# Patient Record
Sex: Female | Born: 1945 | Race: White | Hispanic: No | Marital: Married | Smoking: Former smoker
Health system: Southern US, Community
[De-identification: ages and names within clinical notes are randomized; demographics above are authoritative.]

## PROBLEM LIST (undated history)

## (undated) DIAGNOSIS — I10 Essential (primary) hypertension: Secondary | ICD-10-CM

## (undated) DIAGNOSIS — Z923 Personal history of irradiation: Secondary | ICD-10-CM

## (undated) DIAGNOSIS — I509 Heart failure, unspecified: Secondary | ICD-10-CM

## (undated) DIAGNOSIS — C50919 Malignant neoplasm of unspecified site of unspecified female breast: Secondary | ICD-10-CM

## (undated) DIAGNOSIS — I471 Supraventricular tachycardia, unspecified: Secondary | ICD-10-CM

## (undated) DIAGNOSIS — E785 Hyperlipidemia, unspecified: Secondary | ICD-10-CM

## (undated) DIAGNOSIS — C50912 Malignant neoplasm of unspecified site of left female breast: Secondary | ICD-10-CM

## (undated) DIAGNOSIS — R569 Unspecified convulsions: Secondary | ICD-10-CM

## (undated) DIAGNOSIS — G4730 Sleep apnea, unspecified: Secondary | ICD-10-CM

## (undated) DIAGNOSIS — Z9221 Personal history of antineoplastic chemotherapy: Secondary | ICD-10-CM

## (undated) DIAGNOSIS — M1990 Unspecified osteoarthritis, unspecified site: Secondary | ICD-10-CM

## (undated) DIAGNOSIS — R011 Cardiac murmur, unspecified: Secondary | ICD-10-CM

## (undated) DIAGNOSIS — H269 Unspecified cataract: Secondary | ICD-10-CM

## (undated) DIAGNOSIS — I219 Acute myocardial infarction, unspecified: Secondary | ICD-10-CM

## (undated) DIAGNOSIS — T7840XA Allergy, unspecified, initial encounter: Secondary | ICD-10-CM

## (undated) DIAGNOSIS — G709 Myoneural disorder, unspecified: Secondary | ICD-10-CM

## (undated) HISTORY — DX: Cardiac murmur, unspecified: R01.1

## (undated) HISTORY — DX: Hyperlipidemia, unspecified: E78.5

## (undated) HISTORY — PX: TRANSANAL RECTOPEXY: SHX2563

## (undated) HISTORY — DX: Malignant neoplasm of unspecified site of unspecified female breast: C50.919

## (undated) HISTORY — PX: EYE SURGERY: SHX253

## (undated) HISTORY — DX: Acute myocardial infarction, unspecified: I21.9

## (undated) HISTORY — PX: COSMETIC SURGERY: SHX468

## (undated) HISTORY — PX: LAPAROSCOPIC CHOLECYSTECTOMY: SUR755

## (undated) HISTORY — PX: DIAGNOSTIC LAPAROSCOPY: SUR761

## (undated) HISTORY — PX: BILATERAL SALPINGOOPHORECTOMY: SHX1223

## (undated) HISTORY — PX: APPENDECTOMY: SHX54

## (undated) HISTORY — PX: HAMMER TOE SURGERY: SHX385

## (undated) HISTORY — DX: Unspecified cataract: H26.9

## (undated) HISTORY — DX: Allergy, unspecified, initial encounter: T78.40XA

## (undated) HISTORY — PX: BUNIONECTOMY: SHX129

## (undated) SURGERY — LEAD REVISION/REPAIR
Anesthesia: Moderate Sedation

---
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
## 1977-07-09 HISTORY — PX: RHINOPLASTY: SUR1284

## 1982-07-09 HISTORY — PX: ABDOMINAL HYSTERECTOMY: SHX81

## 1998-07-09 HISTORY — PX: DILATION AND CURETTAGE OF UTERUS: SHX78

## 1999-02-17 ENCOUNTER — Ambulatory Visit (HOSPITAL_COMMUNITY): Admission: RE | Admit: 1999-02-17 | Discharge: 1999-02-17 | Payer: Self-pay | Admitting: Obstetrics & Gynecology

## 1999-02-17 ENCOUNTER — Encounter: Payer: Self-pay | Admitting: Obstetrics & Gynecology

## 1999-03-08 ENCOUNTER — Other Ambulatory Visit: Admission: RE | Admit: 1999-03-08 | Discharge: 1999-03-08 | Payer: Self-pay | Admitting: Obstetrics & Gynecology

## 1999-03-16 ENCOUNTER — Encounter (INDEPENDENT_AMBULATORY_CARE_PROVIDER_SITE_OTHER): Payer: Self-pay

## 1999-03-16 ENCOUNTER — Ambulatory Visit (HOSPITAL_COMMUNITY): Admission: RE | Admit: 1999-03-16 | Discharge: 1999-03-16 | Payer: Self-pay | Admitting: Obstetrics & Gynecology

## 2000-02-15 ENCOUNTER — Encounter: Payer: Self-pay | Admitting: Emergency Medicine

## 2000-02-15 ENCOUNTER — Emergency Department (HOSPITAL_COMMUNITY): Admission: EM | Admit: 2000-02-15 | Discharge: 2000-02-15 | Payer: Self-pay | Admitting: Internal Medicine

## 2000-05-09 ENCOUNTER — Encounter: Payer: Self-pay | Admitting: Obstetrics & Gynecology

## 2000-05-09 ENCOUNTER — Ambulatory Visit (HOSPITAL_COMMUNITY): Admission: RE | Admit: 2000-05-09 | Discharge: 2000-05-09 | Payer: Self-pay | Admitting: Obstetrics & Gynecology

## 2000-05-14 ENCOUNTER — Other Ambulatory Visit: Admission: RE | Admit: 2000-05-14 | Discharge: 2000-05-14 | Payer: Self-pay | Admitting: Obstetrics & Gynecology

## 2000-06-05 ENCOUNTER — Ambulatory Visit (HOSPITAL_COMMUNITY): Admission: RE | Admit: 2000-06-05 | Discharge: 2000-06-05 | Payer: Self-pay | Admitting: Gastroenterology

## 2001-08-01 ENCOUNTER — Ambulatory Visit (HOSPITAL_COMMUNITY): Admission: RE | Admit: 2001-08-01 | Discharge: 2001-08-01 | Payer: Self-pay | Admitting: Internal Medicine

## 2001-08-01 ENCOUNTER — Encounter: Payer: Self-pay | Admitting: Internal Medicine

## 2001-08-05 ENCOUNTER — Other Ambulatory Visit: Admission: RE | Admit: 2001-08-05 | Discharge: 2001-08-05 | Payer: Self-pay | Admitting: Obstetrics & Gynecology

## 2002-10-21 ENCOUNTER — Ambulatory Visit (HOSPITAL_COMMUNITY): Admission: RE | Admit: 2002-10-21 | Discharge: 2002-10-21 | Payer: Self-pay | Admitting: Internal Medicine

## 2002-10-21 ENCOUNTER — Encounter: Payer: Self-pay | Admitting: Obstetrics & Gynecology

## 2002-12-02 ENCOUNTER — Encounter: Payer: Self-pay | Admitting: Gastroenterology

## 2002-12-02 ENCOUNTER — Ambulatory Visit (HOSPITAL_COMMUNITY): Admission: RE | Admit: 2002-12-02 | Discharge: 2002-12-02 | Payer: Self-pay | Admitting: Gastroenterology

## 2002-12-11 ENCOUNTER — Encounter (HOSPITAL_BASED_OUTPATIENT_CLINIC_OR_DEPARTMENT_OTHER): Payer: Self-pay | Admitting: General Surgery

## 2002-12-11 ENCOUNTER — Ambulatory Visit (HOSPITAL_COMMUNITY): Admission: RE | Admit: 2002-12-11 | Discharge: 2002-12-12 | Payer: Self-pay | Admitting: General Surgery

## 2002-12-11 ENCOUNTER — Encounter (INDEPENDENT_AMBULATORY_CARE_PROVIDER_SITE_OTHER): Payer: Self-pay | Admitting: Specialist

## 2002-12-23 ENCOUNTER — Encounter: Admission: RE | Admit: 2002-12-23 | Discharge: 2002-12-23 | Payer: Self-pay | Admitting: Gastroenterology

## 2002-12-23 ENCOUNTER — Encounter: Payer: Self-pay | Admitting: Gastroenterology

## 2004-01-17 ENCOUNTER — Ambulatory Visit (HOSPITAL_COMMUNITY): Admission: RE | Admit: 2004-01-17 | Discharge: 2004-01-17 | Payer: Self-pay | Admitting: Obstetrics & Gynecology

## 2004-02-17 ENCOUNTER — Other Ambulatory Visit: Admission: RE | Admit: 2004-02-17 | Discharge: 2004-02-17 | Payer: Self-pay | Admitting: Obstetrics & Gynecology

## 2004-04-27 ENCOUNTER — Encounter: Admission: RE | Admit: 2004-04-27 | Discharge: 2004-05-11 | Payer: Self-pay | Admitting: Occupational Medicine

## 2004-06-08 ENCOUNTER — Encounter
Admission: RE | Admit: 2004-06-08 | Discharge: 2004-09-06 | Payer: Self-pay | Admitting: Physical Medicine & Rehabilitation

## 2004-06-09 ENCOUNTER — Ambulatory Visit: Payer: Self-pay | Admitting: Physical Medicine & Rehabilitation

## 2004-06-24 ENCOUNTER — Encounter
Admission: RE | Admit: 2004-06-24 | Discharge: 2004-06-24 | Payer: Self-pay | Admitting: Physical Medicine & Rehabilitation

## 2005-01-11 ENCOUNTER — Encounter: Admission: RE | Admit: 2005-01-11 | Discharge: 2005-01-11 | Payer: Self-pay | Admitting: Orthopedic Surgery

## 2005-05-21 ENCOUNTER — Ambulatory Visit (HOSPITAL_COMMUNITY): Admission: RE | Admit: 2005-05-21 | Discharge: 2005-05-21 | Payer: Self-pay | Admitting: Orthopedic Surgery

## 2005-05-22 ENCOUNTER — Ambulatory Visit (HOSPITAL_COMMUNITY): Admission: RE | Admit: 2005-05-22 | Discharge: 2005-05-22 | Payer: Self-pay | Admitting: Obstetrics & Gynecology

## 2005-06-05 ENCOUNTER — Ambulatory Visit (HOSPITAL_COMMUNITY): Admission: RE | Admit: 2005-06-05 | Discharge: 2005-06-05 | Payer: Self-pay | Admitting: Gastroenterology

## 2005-06-06 ENCOUNTER — Encounter: Admission: RE | Admit: 2005-06-06 | Discharge: 2005-06-06 | Payer: Self-pay | Admitting: Orthopedic Surgery

## 2005-06-13 ENCOUNTER — Encounter: Admission: RE | Admit: 2005-06-13 | Discharge: 2005-06-13 | Payer: Self-pay | Admitting: Obstetrics & Gynecology

## 2005-08-21 ENCOUNTER — Other Ambulatory Visit: Admission: RE | Admit: 2005-08-21 | Discharge: 2005-08-21 | Payer: Self-pay | Admitting: Obstetrics & Gynecology

## 2006-06-20 ENCOUNTER — Ambulatory Visit (HOSPITAL_COMMUNITY): Admission: RE | Admit: 2006-06-20 | Discharge: 2006-06-20 | Payer: Self-pay | Admitting: Obstetrics & Gynecology

## 2007-01-23 ENCOUNTER — Ambulatory Visit: Payer: Self-pay | Admitting: Internal Medicine

## 2007-01-23 DIAGNOSIS — E785 Hyperlipidemia, unspecified: Secondary | ICD-10-CM | POA: Insufficient documentation

## 2007-01-23 DIAGNOSIS — J209 Acute bronchitis, unspecified: Secondary | ICD-10-CM

## 2007-01-27 ENCOUNTER — Encounter: Payer: Self-pay | Admitting: Internal Medicine

## 2007-01-30 LAB — CONVERTED CEMR LAB
ALT: 13 units/L (ref 0–35)
AST: 20 units/L (ref 0–37)
Alkaline Phosphatase: 96 units/L (ref 39–117)
CO2: 20 meq/L (ref 19–32)
Chloride: 108 meq/L (ref 96–112)
Eosinophils Relative: 6 % — ABNORMAL HIGH (ref 0–5)
HCT: 44.7 % (ref 36.0–46.0)
Lymphocytes Relative: 19 % (ref 12–46)
Lymphs Abs: 1 10*3/uL (ref 0.7–3.3)
Neutro Abs: 3.6 10*3/uL (ref 1.7–7.7)
Neutrophils Relative %: 68 % (ref 43–77)
Platelets: 214 10*3/uL (ref 150–400)
Potassium: 4.2 meq/L (ref 3.5–5.3)
Sed Rate: 8 mm/hr (ref 0–22)
Sodium: 142 meq/L (ref 135–145)
Total Protein: 6.8 g/dL (ref 6.0–8.3)
WBC: 5.3 10*3/uL (ref 4.0–10.5)

## 2007-01-31 ENCOUNTER — Encounter (INDEPENDENT_AMBULATORY_CARE_PROVIDER_SITE_OTHER): Payer: Self-pay | Admitting: *Deleted

## 2007-03-13 ENCOUNTER — Encounter (INDEPENDENT_AMBULATORY_CARE_PROVIDER_SITE_OTHER): Payer: Self-pay | Admitting: *Deleted

## 2007-03-27 ENCOUNTER — Encounter: Payer: Self-pay | Admitting: Internal Medicine

## 2007-04-07 ENCOUNTER — Ambulatory Visit (HOSPITAL_COMMUNITY): Admission: RE | Admit: 2007-04-07 | Discharge: 2007-04-07 | Payer: Self-pay | Admitting: Cardiology

## 2007-04-09 ENCOUNTER — Ambulatory Visit (HOSPITAL_COMMUNITY): Admission: RE | Admit: 2007-04-09 | Discharge: 2007-04-09 | Payer: Self-pay | Admitting: Cardiology

## 2007-04-09 ENCOUNTER — Encounter (INDEPENDENT_AMBULATORY_CARE_PROVIDER_SITE_OTHER): Payer: Self-pay | Admitting: Cardiology

## 2007-04-18 ENCOUNTER — Ambulatory Visit (HOSPITAL_COMMUNITY): Admission: RE | Admit: 2007-04-18 | Discharge: 2007-04-18 | Payer: Self-pay | Admitting: Cardiology

## 2007-04-22 ENCOUNTER — Encounter: Payer: Self-pay | Admitting: Internal Medicine

## 2007-07-10 DIAGNOSIS — Z923 Personal history of irradiation: Secondary | ICD-10-CM

## 2007-07-10 DIAGNOSIS — Z9221 Personal history of antineoplastic chemotherapy: Secondary | ICD-10-CM

## 2007-07-10 DIAGNOSIS — C50912 Malignant neoplasm of unspecified site of left female breast: Secondary | ICD-10-CM

## 2007-07-10 HISTORY — PX: BREAST BIOPSY: SHX20

## 2007-07-10 HISTORY — DX: Personal history of irradiation: Z92.3

## 2007-07-10 HISTORY — DX: Malignant neoplasm of unspecified site of left female breast: C50.912

## 2007-07-10 HISTORY — PX: BREAST LUMPECTOMY: SHX2

## 2007-07-10 HISTORY — DX: Personal history of antineoplastic chemotherapy: Z92.21

## 2007-09-10 ENCOUNTER — Encounter: Admission: RE | Admit: 2007-09-10 | Discharge: 2007-09-10 | Payer: Self-pay | Admitting: Obstetrics & Gynecology

## 2007-09-10 ENCOUNTER — Encounter (INDEPENDENT_AMBULATORY_CARE_PROVIDER_SITE_OTHER): Payer: Self-pay | Admitting: Diagnostic Radiology

## 2007-09-19 ENCOUNTER — Ambulatory Visit (HOSPITAL_COMMUNITY): Admission: RE | Admit: 2007-09-19 | Discharge: 2007-09-19 | Payer: Self-pay | Admitting: Surgery

## 2007-10-02 ENCOUNTER — Ambulatory Visit: Admission: RE | Admit: 2007-10-02 | Discharge: 2007-12-31 | Payer: Self-pay | Admitting: Radiation Oncology

## 2007-10-17 ENCOUNTER — Ambulatory Visit (HOSPITAL_COMMUNITY): Admission: RE | Admit: 2007-10-17 | Discharge: 2007-10-17 | Payer: Self-pay | Admitting: Surgery

## 2007-10-17 ENCOUNTER — Encounter (INDEPENDENT_AMBULATORY_CARE_PROVIDER_SITE_OTHER): Payer: Self-pay | Admitting: Surgery

## 2007-10-22 ENCOUNTER — Ambulatory Visit: Payer: Self-pay | Admitting: Oncology

## 2007-11-05 LAB — CBC WITH DIFFERENTIAL/PLATELET
Eosinophils Absolute: 0.3 10*3/uL (ref 0.0–0.5)
MONO#: 0.5 10*3/uL (ref 0.1–0.9)
MONO%: 8.6 % (ref 0.0–13.0)
NEUT#: 3.9 10*3/uL (ref 1.5–6.5)
RBC: 4.7 10*6/uL (ref 3.70–5.32)
RDW: 12.9 % (ref 11.3–14.5)
WBC: 6 10*3/uL (ref 3.9–10.0)
lymph#: 1.2 10*3/uL (ref 0.9–3.3)

## 2007-11-06 LAB — COMPREHENSIVE METABOLIC PANEL
Albumin: 4.5 g/dL (ref 3.5–5.2)
Alkaline Phosphatase: 116 U/L (ref 39–117)
CO2: 25 mEq/L (ref 19–32)
Glucose, Bld: 91 mg/dL (ref 70–99)
Potassium: 4.1 mEq/L (ref 3.5–5.3)
Sodium: 141 mEq/L (ref 135–145)
Total Protein: 7.1 g/dL (ref 6.0–8.3)

## 2007-11-06 LAB — VITAMIN D 25 HYDROXY (VIT D DEFICIENCY, FRACTURES): Vit D, 25-Hydroxy: 36 ng/mL (ref 30–89)

## 2007-11-18 ENCOUNTER — Ambulatory Visit (HOSPITAL_BASED_OUTPATIENT_CLINIC_OR_DEPARTMENT_OTHER): Admission: RE | Admit: 2007-11-18 | Discharge: 2007-11-18 | Payer: Self-pay | Admitting: Surgery

## 2007-12-02 ENCOUNTER — Ambulatory Visit: Payer: Self-pay | Admitting: Oncology

## 2007-12-04 LAB — CBC WITH DIFFERENTIAL/PLATELET
BASO%: 0.1 % (ref 0.0–2.0)
EOS%: 0.5 % (ref 0.0–7.0)
MCH: 31 pg (ref 26.0–34.0)
MCV: 90.2 fL (ref 81.0–101.0)
MONO%: 5.9 % (ref 0.0–13.0)
RBC: 4.32 10*6/uL (ref 3.70–5.32)
RDW: 13 % (ref 11.3–14.5)

## 2007-12-11 ENCOUNTER — Encounter: Payer: Self-pay | Admitting: Internal Medicine

## 2007-12-11 LAB — CBC WITH DIFFERENTIAL/PLATELET
BASO%: 0.1 % (ref 0.0–2.0)
Basophils Absolute: 0 10*3/uL (ref 0.0–0.1)
Eosinophils Absolute: 0 10*3/uL (ref 0.0–0.5)
HCT: 34 % — ABNORMAL LOW (ref 34.8–46.6)
HGB: 11.9 g/dL (ref 11.6–15.9)
LYMPH%: 6.4 % — ABNORMAL LOW (ref 14.0–48.0)
MONO#: 0 10*3/uL — ABNORMAL LOW (ref 0.1–0.9)
NEUT#: 5 10*3/uL (ref 1.5–6.5)
NEUT%: 92.5 % — ABNORMAL HIGH (ref 39.6–76.8)
Platelets: 171 10*3/uL (ref 145–400)
WBC: 5.4 10*3/uL (ref 3.9–10.0)
lymph#: 0.3 10*3/uL — ABNORMAL LOW (ref 0.9–3.3)

## 2007-12-26 LAB — CBC WITH DIFFERENTIAL/PLATELET
Basophils Absolute: 0.1 10*3/uL (ref 0.0–0.1)
EOS%: 2.9 % (ref 0.0–7.0)
HCT: 37.9 % (ref 34.8–46.6)
HGB: 13.4 g/dL (ref 11.6–15.9)
LYMPH%: 10.2 % — ABNORMAL LOW (ref 14.0–48.0)
MCH: 31.8 pg (ref 26.0–34.0)
MCHC: 35.3 g/dL (ref 32.0–36.0)
MCV: 90.1 fL (ref 81.0–101.0)
NEUT%: 74.4 % (ref 39.6–76.8)
Platelets: 242 10*3/uL (ref 145–400)
lymph#: 0.9 10*3/uL (ref 0.9–3.3)

## 2007-12-31 ENCOUNTER — Encounter: Payer: Self-pay | Admitting: Internal Medicine

## 2007-12-31 LAB — CBC WITH DIFFERENTIAL/PLATELET
BASO%: 0 % (ref 0.0–2.0)
Basophils Absolute: 0 10*3/uL (ref 0.0–0.1)
EOS%: 1.1 % (ref 0.0–7.0)
HGB: 12.3 g/dL (ref 11.6–15.9)
MCH: 32.1 pg (ref 26.0–34.0)
MCHC: 35.2 g/dL (ref 32.0–36.0)
MCV: 91.2 fL (ref 81.0–101.0)
MONO%: 0.5 % (ref 0.0–13.0)
NEUT%: 94.9 % — ABNORMAL HIGH (ref 39.6–76.8)
RDW: 15.7 % — ABNORMAL HIGH (ref 11.3–14.5)

## 2008-01-12 ENCOUNTER — Encounter: Payer: Self-pay | Admitting: Internal Medicine

## 2008-01-12 LAB — CBC WITH DIFFERENTIAL/PLATELET
Basophils Absolute: 0.1 10*3/uL (ref 0.0–0.1)
Eosinophils Absolute: 0.2 10*3/uL (ref 0.0–0.5)
HCT: 37.5 % (ref 34.8–46.6)
HGB: 13.4 g/dL (ref 11.6–15.9)
MCV: 89.7 fL (ref 81.0–101.0)
MONO%: 8.5 % (ref 0.0–13.0)
NEUT#: 5.6 10*3/uL (ref 1.5–6.5)
NEUT%: 76.3 % (ref 39.6–76.8)
RDW: 15.3 % — ABNORMAL HIGH (ref 11.3–14.5)

## 2008-01-12 LAB — COMPREHENSIVE METABOLIC PANEL
ALT: 19 U/L (ref 0–35)
AST: 16 U/L (ref 0–37)
CO2: 22 mEq/L (ref 19–32)
Calcium: 8.5 mg/dL (ref 8.4–10.5)
Chloride: 106 mEq/L (ref 96–112)
Creatinine, Ser: 0.62 mg/dL (ref 0.40–1.20)
Potassium: 4.1 mEq/L (ref 3.5–5.3)
Sodium: 137 mEq/L (ref 135–145)
Total Protein: 6.5 g/dL (ref 6.0–8.3)

## 2008-01-14 ENCOUNTER — Ambulatory Visit: Payer: Self-pay | Admitting: Oncology

## 2008-01-19 ENCOUNTER — Encounter: Payer: Self-pay | Admitting: Internal Medicine

## 2008-01-19 LAB — CBC WITH DIFFERENTIAL/PLATELET
Basophils Absolute: 0 10*3/uL (ref 0.0–0.1)
Eosinophils Absolute: 0 10*3/uL (ref 0.0–0.5)
HGB: 11.4 g/dL — ABNORMAL LOW (ref 11.6–15.9)
LYMPH%: 15.5 % (ref 14.0–48.0)
MCV: 90.8 fL (ref 81.0–101.0)
MONO%: 5.8 % (ref 0.0–13.0)
NEUT#: 1.2 10*3/uL — ABNORMAL LOW (ref 1.5–6.5)
Platelets: 112 10*3/uL — ABNORMAL LOW (ref 145–400)

## 2008-01-19 LAB — URINALYSIS, MICROSCOPIC - CHCC
Blood: NEGATIVE
Leukocyte Esterase: NEGATIVE
Nitrite: NEGATIVE
Protein: NEGATIVE mg/dL
pH: 7.5 (ref 4.6–8.0)

## 2008-01-28 ENCOUNTER — Encounter: Payer: Self-pay | Admitting: Internal Medicine

## 2008-01-28 LAB — CBC WITH DIFFERENTIAL/PLATELET
Basophils Absolute: 0 10*3/uL (ref 0.0–0.1)
EOS%: 0.7 % (ref 0.0–7.0)
HCT: 32.6 % — ABNORMAL LOW (ref 34.8–46.6)
HGB: 11.6 g/dL (ref 11.6–15.9)
MCH: 32.5 pg (ref 26.0–34.0)
MCV: 91.5 fL (ref 81.0–101.0)
MONO%: 7.8 % (ref 0.0–13.0)
NEUT%: 82.5 % — ABNORMAL HIGH (ref 39.6–76.8)
lymph#: 0.7 10*3/uL — ABNORMAL LOW (ref 0.9–3.3)

## 2008-02-06 ENCOUNTER — Encounter: Payer: Self-pay | Admitting: Internal Medicine

## 2008-02-06 LAB — CBC WITH DIFFERENTIAL/PLATELET
Basophils Absolute: 0.1 10*3/uL (ref 0.0–0.1)
EOS%: 2.5 % (ref 0.0–7.0)
HGB: 12 g/dL (ref 11.6–15.9)
LYMPH%: 10.6 % — ABNORMAL LOW (ref 14.0–48.0)
MCH: 32.5 pg (ref 26.0–34.0)
MCV: 91.3 fL (ref 81.0–101.0)
MONO%: 10 % (ref 0.0–13.0)
RDW: 15.3 % — ABNORMAL HIGH (ref 11.3–14.5)

## 2008-02-13 LAB — CBC WITH DIFFERENTIAL/PLATELET
Basophils Absolute: 0.1 10*3/uL (ref 0.0–0.1)
Eosinophils Absolute: 0.2 10*3/uL (ref 0.0–0.5)
HCT: 35.8 % (ref 34.8–46.6)
HGB: 12.9 g/dL (ref 11.6–15.9)
LYMPH%: 14 % (ref 14.0–48.0)
MCV: 92.2 fL (ref 81.0–101.0)
MONO%: 9.8 % (ref 0.0–13.0)
NEUT#: 2.7 10*3/uL (ref 1.5–6.5)
Platelets: 175 10*3/uL (ref 145–400)

## 2008-02-20 ENCOUNTER — Encounter: Payer: Self-pay | Admitting: Internal Medicine

## 2008-02-20 LAB — CBC WITH DIFFERENTIAL/PLATELET
Eosinophils Absolute: 0.2 10*3/uL (ref 0.0–0.5)
HCT: 34.1 % — ABNORMAL LOW (ref 34.8–46.6)
LYMPH%: 11.7 % — ABNORMAL LOW (ref 14.0–48.0)
MCHC: 35.8 g/dL (ref 32.0–36.0)
MCV: 92.2 fL (ref 81.0–101.0)
MONO%: 7.8 % (ref 0.0–13.0)
NEUT#: 3.7 10*3/uL (ref 1.5–6.5)
NEUT%: 74.3 % (ref 39.6–76.8)
Platelets: 262 10*3/uL (ref 145–400)
RBC: 3.7 10*6/uL (ref 3.70–5.32)

## 2008-02-27 ENCOUNTER — Encounter: Payer: Self-pay | Admitting: Internal Medicine

## 2008-02-27 LAB — CBC WITH DIFFERENTIAL/PLATELET
BASO%: 1.7 % (ref 0.0–2.0)
EOS%: 4.1 % (ref 0.0–7.0)
LYMPH%: 11.5 % — ABNORMAL LOW (ref 14.0–48.0)
MCH: 33.6 pg (ref 26.0–34.0)
MCHC: 35.9 g/dL (ref 32.0–36.0)
MONO#: 0.3 10*3/uL (ref 0.1–0.9)
MONO%: 7.3 % (ref 0.0–13.0)
NEUT%: 75.4 % (ref 39.6–76.8)
Platelets: 218 10*3/uL (ref 145–400)
RBC: 3.79 10*6/uL (ref 3.70–5.32)
WBC: 4.8 10*3/uL (ref 3.9–10.0)

## 2008-03-02 ENCOUNTER — Ambulatory Visit: Payer: Self-pay | Admitting: Oncology

## 2008-03-04 ENCOUNTER — Encounter: Payer: Self-pay | Admitting: Internal Medicine

## 2008-03-04 LAB — CBC WITH DIFFERENTIAL/PLATELET
BASO%: 0.4 % (ref 0.0–2.0)
EOS%: 3.4 % (ref 0.0–7.0)
HCT: 34 % — ABNORMAL LOW (ref 34.8–46.6)
MCH: 34 pg (ref 26.0–34.0)
MCHC: 35.2 g/dL (ref 32.0–36.0)
MONO#: 0.3 10*3/uL (ref 0.1–0.9)
RBC: 3.52 10*6/uL — ABNORMAL LOW (ref 3.70–5.32)
RDW: 14.7 % — ABNORMAL HIGH (ref 11.3–14.5)
WBC: 3.4 10*3/uL — ABNORMAL LOW (ref 3.9–10.0)
lymph#: 0.3 10*3/uL — ABNORMAL LOW (ref 0.9–3.3)

## 2008-03-19 ENCOUNTER — Encounter: Payer: Self-pay | Admitting: Oncology

## 2008-03-19 ENCOUNTER — Ambulatory Visit: Payer: Self-pay | Admitting: Vascular Surgery

## 2008-03-19 ENCOUNTER — Ambulatory Visit: Admission: RE | Admit: 2008-03-19 | Discharge: 2008-03-19 | Payer: Self-pay | Admitting: Oncology

## 2008-03-19 LAB — CBC WITH DIFFERENTIAL/PLATELET
BASO%: 2.1 % — ABNORMAL HIGH (ref 0.0–2.0)
Eosinophils Absolute: 0.2 10*3/uL (ref 0.0–0.5)
LYMPH%: 21 % (ref 14.0–48.0)
MCHC: 35.6 g/dL (ref 32.0–36.0)
MONO#: 0.5 10*3/uL (ref 0.1–0.9)
Platelets: 242 10*3/uL (ref 145–400)
RBC: 4.2 10*6/uL (ref 3.70–5.32)
lymph#: 0.6 10*3/uL — ABNORMAL LOW (ref 0.9–3.3)

## 2008-03-26 LAB — CBC WITH DIFFERENTIAL/PLATELET
BASO%: 2.3 % — ABNORMAL HIGH (ref 0.0–2.0)
Basophils Absolute: 0.1 10*3/uL (ref 0.0–0.1)
Eosinophils Absolute: 0.2 10*3/uL (ref 0.0–0.5)
HCT: 37.9 % (ref 34.8–46.6)
HGB: 13.5 g/dL (ref 11.6–15.9)
LYMPH%: 20.2 % (ref 14.0–48.0)
MONO#: 0.3 10*3/uL (ref 0.1–0.9)
NEUT#: 2.4 10*3/uL (ref 1.5–6.5)
NEUT%: 64.1 % (ref 39.6–76.8)
Platelets: 234 10*3/uL (ref 145–400)
WBC: 3.8 10*3/uL — ABNORMAL LOW (ref 3.9–10.0)
lymph#: 0.8 10*3/uL — ABNORMAL LOW (ref 0.9–3.3)

## 2008-04-02 ENCOUNTER — Encounter: Payer: Self-pay | Admitting: Internal Medicine

## 2008-04-02 LAB — CBC WITH DIFFERENTIAL/PLATELET
Basophils Absolute: 0.1 10*3/uL (ref 0.0–0.1)
EOS%: 6.5 % (ref 0.0–7.0)
HCT: 37.1 % (ref 34.8–46.6)
HGB: 13.2 g/dL (ref 11.6–15.9)
LYMPH%: 17.6 % (ref 14.0–48.0)
MCH: 32.4 pg (ref 26.0–34.0)
MCHC: 35.7 g/dL (ref 32.0–36.0)
MCV: 90.7 fL (ref 81.0–101.0)
NEUT%: 65.8 % (ref 39.6–76.8)
Platelets: 272 10*3/uL (ref 145–400)
lymph#: 0.7 10*3/uL — ABNORMAL LOW (ref 0.9–3.3)

## 2008-04-09 LAB — CBC WITH DIFFERENTIAL/PLATELET
BASO%: 2 % (ref 0.0–2.0)
Basophils Absolute: 0.1 10*3/uL (ref 0.0–0.1)
Eosinophils Absolute: 0.1 10*3/uL (ref 0.0–0.5)
HCT: 37.1 % (ref 34.8–46.6)
HGB: 13.1 g/dL (ref 11.6–15.9)
MCHC: 35.4 g/dL (ref 32.0–36.0)
MONO#: 0.2 10*3/uL (ref 0.1–0.9)
NEUT#: 1.9 10*3/uL (ref 1.5–6.5)
NEUT%: 65.2 % (ref 39.6–76.8)
WBC: 2.9 10*3/uL — ABNORMAL LOW (ref 3.9–10.0)
lymph#: 0.6 10*3/uL — ABNORMAL LOW (ref 0.9–3.3)

## 2008-04-13 ENCOUNTER — Ambulatory Visit: Admission: RE | Admit: 2008-04-13 | Discharge: 2008-06-28 | Payer: Self-pay | Admitting: Radiation Oncology

## 2008-04-16 ENCOUNTER — Encounter: Payer: Self-pay | Admitting: Internal Medicine

## 2008-04-16 LAB — CBC WITH DIFFERENTIAL/PLATELET
Basophils Absolute: 0 10*3/uL (ref 0.0–0.1)
EOS%: 3.9 % (ref 0.0–7.0)
HCT: 36.6 % (ref 34.8–46.6)
HGB: 13.2 g/dL (ref 11.6–15.9)
MCH: 34.1 pg — ABNORMAL HIGH (ref 26.0–34.0)
NEUT%: 65.1 % (ref 39.6–76.8)
lymph#: 0.8 10*3/uL — ABNORMAL LOW (ref 0.9–3.3)

## 2008-05-28 ENCOUNTER — Ambulatory Visit: Payer: Self-pay | Admitting: Oncology

## 2008-06-07 ENCOUNTER — Encounter: Payer: Self-pay | Admitting: Internal Medicine

## 2008-06-07 LAB — CBC WITH DIFFERENTIAL/PLATELET
Basophils Absolute: 0 10*3/uL (ref 0.0–0.1)
Eosinophils Absolute: 0.1 10*3/uL (ref 0.0–0.5)
HGB: 13.6 g/dL (ref 11.6–15.9)
MONO#: 0.5 10*3/uL (ref 0.1–0.9)
NEUT#: 5.8 10*3/uL (ref 1.5–6.5)
RDW: 13.3 % (ref 11.3–14.5)
lymph#: 0.4 10*3/uL — ABNORMAL LOW (ref 0.9–3.3)

## 2008-06-08 LAB — VITAMIN D 25 HYDROXY (VIT D DEFICIENCY, FRACTURES): Vit D, 25-Hydroxy: 37 ng/mL (ref 30–89)

## 2008-06-08 LAB — COMPREHENSIVE METABOLIC PANEL
Albumin: 4.6 g/dL (ref 3.5–5.2)
BUN: 17 mg/dL (ref 6–23)
Calcium: 9.9 mg/dL (ref 8.4–10.5)
Chloride: 102 mEq/L (ref 96–112)
Glucose, Bld: 89 mg/dL (ref 70–99)
Potassium: 3.7 mEq/L (ref 3.5–5.3)

## 2008-06-21 ENCOUNTER — Ambulatory Visit (HOSPITAL_BASED_OUTPATIENT_CLINIC_OR_DEPARTMENT_OTHER): Admission: RE | Admit: 2008-06-21 | Discharge: 2008-06-21 | Payer: Self-pay | Admitting: Surgery

## 2008-08-30 ENCOUNTER — Ambulatory Visit: Payer: Self-pay | Admitting: Oncology

## 2008-09-01 ENCOUNTER — Encounter: Payer: Self-pay | Admitting: Internal Medicine

## 2008-09-01 LAB — CBC WITH DIFFERENTIAL/PLATELET
BASO%: 0.5 % (ref 0.0–2.0)
HCT: 39.8 % (ref 34.8–46.6)
MCHC: 35 g/dL (ref 31.5–36.0)
MONO#: 0.5 10*3/uL (ref 0.1–0.9)
NEUT#: 3.5 10*3/uL (ref 1.5–6.5)
NEUT%: 70 % (ref 38.4–76.8)
WBC: 5 10*3/uL (ref 3.9–10.3)
lymph#: 0.7 10*3/uL — ABNORMAL LOW (ref 0.9–3.3)

## 2008-09-01 LAB — COMPREHENSIVE METABOLIC PANEL
AST: 26 U/L (ref 0–37)
BUN: 17 mg/dL (ref 6–23)
CO2: 26 mEq/L (ref 19–32)
Calcium: 9.6 mg/dL (ref 8.4–10.5)
Chloride: 105 mEq/L (ref 96–112)
Creatinine, Ser: 0.7 mg/dL (ref 0.40–1.20)

## 2008-09-01 LAB — CANCER ANTIGEN 27.29: CA 27.29: 11 U/mL (ref 0–39)

## 2008-09-10 ENCOUNTER — Encounter: Admission: RE | Admit: 2008-09-10 | Discharge: 2008-09-10 | Payer: Self-pay | Admitting: Obstetrics & Gynecology

## 2008-12-31 ENCOUNTER — Ambulatory Visit: Payer: Self-pay | Admitting: Oncology

## 2009-01-02 ENCOUNTER — Encounter: Payer: Self-pay | Admitting: Family Medicine

## 2009-01-02 ENCOUNTER — Ambulatory Visit: Payer: Self-pay | Admitting: Family Medicine

## 2009-01-02 DIAGNOSIS — J329 Chronic sinusitis, unspecified: Secondary | ICD-10-CM | POA: Insufficient documentation

## 2009-01-04 LAB — CBC WITH DIFFERENTIAL/PLATELET
Basophils Absolute: 0 10*3/uL (ref 0.0–0.1)
Eosinophils Absolute: 0.3 10*3/uL (ref 0.0–0.5)
HCT: 39 % (ref 34.8–46.6)
HGB: 13.9 g/dL (ref 11.6–15.9)
LYMPH%: 12.7 % — ABNORMAL LOW (ref 14.0–49.7)
MCV: 90.3 fL (ref 79.5–101.0)
MONO#: 0.4 10*3/uL (ref 0.1–0.9)
NEUT#: 4 10*3/uL (ref 1.5–6.5)
NEUT%: 73.2 % (ref 38.4–76.8)
Platelets: 205 10*3/uL (ref 145–400)
WBC: 5.5 10*3/uL (ref 3.9–10.3)

## 2009-01-04 LAB — COMPREHENSIVE METABOLIC PANEL
BUN: 18 mg/dL (ref 6–23)
CO2: 30 mEq/L (ref 19–32)
Creatinine, Ser: 0.66 mg/dL (ref 0.40–1.20)
Glucose, Bld: 90 mg/dL (ref 70–99)
Sodium: 140 mEq/L (ref 135–145)
Total Bilirubin: 0.6 mg/dL (ref 0.3–1.2)
Total Protein: 6.8 g/dL (ref 6.0–8.3)

## 2009-01-04 LAB — CANCER ANTIGEN 27.29: CA 27.29: 13 U/mL (ref 0–39)

## 2009-01-05 ENCOUNTER — Telehealth: Payer: Self-pay | Admitting: Family Medicine

## 2009-01-06 ENCOUNTER — Telehealth (INDEPENDENT_AMBULATORY_CARE_PROVIDER_SITE_OTHER): Payer: Self-pay | Admitting: *Deleted

## 2009-04-15 ENCOUNTER — Ambulatory Visit: Payer: Self-pay | Admitting: Oncology

## 2009-04-19 LAB — COMPREHENSIVE METABOLIC PANEL
AST: 19 U/L (ref 0–37)
Alkaline Phosphatase: 154 U/L — ABNORMAL HIGH (ref 39–117)
BUN: 17 mg/dL (ref 6–23)
Glucose, Bld: 77 mg/dL (ref 70–99)
Total Bilirubin: 0.5 mg/dL (ref 0.3–1.2)

## 2009-04-19 LAB — CBC WITH DIFFERENTIAL/PLATELET
Basophils Absolute: 0 10*3/uL (ref 0.0–0.1)
EOS%: 2.7 % (ref 0.0–7.0)
Eosinophils Absolute: 0.1 10*3/uL (ref 0.0–0.5)
LYMPH%: 13.3 % — ABNORMAL LOW (ref 14.0–49.7)
MCH: 31.6 pg (ref 25.1–34.0)
MCV: 89.7 fL (ref 79.5–101.0)
MONO%: 7.7 % (ref 0.0–14.0)
NEUT#: 4.1 10*3/uL (ref 1.5–6.5)
Platelets: 221 10*3/uL (ref 145–400)
RBC: 4.51 10*6/uL (ref 3.70–5.45)
RDW: 13.5 % (ref 11.2–14.5)

## 2009-04-22 ENCOUNTER — Ambulatory Visit (HOSPITAL_BASED_OUTPATIENT_CLINIC_OR_DEPARTMENT_OTHER): Admission: RE | Admit: 2009-04-22 | Discharge: 2009-04-22 | Payer: Self-pay | Admitting: Podiatry

## 2009-05-13 ENCOUNTER — Ambulatory Visit (HOSPITAL_BASED_OUTPATIENT_CLINIC_OR_DEPARTMENT_OTHER): Admission: RE | Admit: 2009-05-13 | Discharge: 2009-05-13 | Payer: Self-pay | Admitting: Podiatry

## 2009-09-19 ENCOUNTER — Encounter: Admission: RE | Admit: 2009-09-19 | Discharge: 2009-09-19 | Payer: Self-pay | Admitting: Oncology

## 2009-10-07 ENCOUNTER — Ambulatory Visit: Admission: RE | Admit: 2009-10-07 | Discharge: 2009-10-07 | Payer: Self-pay | Admitting: Anesthesiology

## 2009-10-25 ENCOUNTER — Ambulatory Visit: Payer: Self-pay | Admitting: Oncology

## 2009-10-27 ENCOUNTER — Ambulatory Visit (HOSPITAL_COMMUNITY): Admission: RE | Admit: 2009-10-27 | Discharge: 2009-10-27 | Payer: Self-pay | Admitting: Oncology

## 2009-10-27 LAB — CBC WITH DIFFERENTIAL/PLATELET
Eosinophils Absolute: 0.3 10*3/uL (ref 0.0–0.5)
HCT: 40.3 % (ref 34.8–46.6)
LYMPH%: 19 % (ref 14.0–49.7)
MONO#: 0.4 10*3/uL (ref 0.1–0.9)
NEUT#: 2.6 10*3/uL (ref 1.5–6.5)
NEUT%: 64.7 % (ref 38.4–76.8)
Platelets: 173 10*3/uL (ref 145–400)
WBC: 4.1 10*3/uL (ref 3.9–10.3)
lymph#: 0.8 10*3/uL — ABNORMAL LOW (ref 0.9–3.3)

## 2009-10-27 LAB — CANCER ANTIGEN 27.29: CA 27.29: 13 U/mL (ref 0–39)

## 2009-10-27 LAB — COMPREHENSIVE METABOLIC PANEL
CO2: 28 mEq/L (ref 19–32)
Calcium: 9.2 mg/dL (ref 8.4–10.5)
Chloride: 106 mEq/L (ref 96–112)
Glucose, Bld: 115 mg/dL — ABNORMAL HIGH (ref 70–99)
Sodium: 140 mEq/L (ref 135–145)
Total Bilirubin: 0.8 mg/dL (ref 0.3–1.2)
Total Protein: 6.6 g/dL (ref 6.0–8.3)

## 2010-05-28 ENCOUNTER — Emergency Department (HOSPITAL_COMMUNITY): Admission: EM | Admit: 2010-05-28 | Discharge: 2010-05-28 | Payer: Self-pay | Admitting: Emergency Medicine

## 2010-06-13 ENCOUNTER — Ambulatory Visit (HOSPITAL_COMMUNITY)
Admission: RE | Admit: 2010-06-13 | Discharge: 2010-06-13 | Payer: Self-pay | Source: Home / Self Care | Attending: Oncology | Admitting: Oncology

## 2010-07-29 ENCOUNTER — Encounter: Payer: Self-pay | Admitting: Specialist

## 2010-07-30 ENCOUNTER — Encounter: Payer: Self-pay | Admitting: Surgery

## 2010-07-30 ENCOUNTER — Encounter: Payer: Self-pay | Admitting: Obstetrics & Gynecology

## 2010-09-08 ENCOUNTER — Other Ambulatory Visit: Payer: Self-pay | Admitting: Oncology

## 2010-09-08 DIAGNOSIS — Z9889 Other specified postprocedural states: Secondary | ICD-10-CM

## 2010-09-19 LAB — TSH: TSH: 6.125 u[IU]/mL — ABNORMAL HIGH (ref 0.350–4.500)

## 2010-09-19 LAB — POCT I-STAT, CHEM 8
BUN: 20 mg/dL (ref 6–23)
Calcium, Ion: 0.96 mmol/L — ABNORMAL LOW (ref 1.12–1.32)
HCT: 46 % (ref 36.0–46.0)
Hemoglobin: 15.6 g/dL — ABNORMAL HIGH (ref 12.0–15.0)
Sodium: 137 mEq/L (ref 135–145)
TCO2: 24 mmol/L (ref 0–100)

## 2010-09-27 ENCOUNTER — Ambulatory Visit
Admission: RE | Admit: 2010-09-27 | Discharge: 2010-09-27 | Disposition: A | Discharge status: Home / Self Care | Payer: 59 | Source: Ambulatory Visit | Attending: Oncology | Admitting: Oncology

## 2010-09-27 DIAGNOSIS — Z9889 Other specified postprocedural states: Secondary | ICD-10-CM

## 2010-10-11 LAB — POCT HEMOGLOBIN-HEMACUE: Hemoglobin: 14.9 g/dL (ref 12.0–15.0)

## 2010-10-19 ENCOUNTER — Other Ambulatory Visit: Payer: Self-pay | Admitting: Oncology

## 2010-10-19 ENCOUNTER — Encounter (HOSPITAL_BASED_OUTPATIENT_CLINIC_OR_DEPARTMENT_OTHER): Payer: 59 | Admitting: Oncology

## 2010-10-19 DIAGNOSIS — Z170 Estrogen receptor positive status [ER+]: Secondary | ICD-10-CM

## 2010-10-19 DIAGNOSIS — C50219 Malignant neoplasm of upper-inner quadrant of unspecified female breast: Secondary | ICD-10-CM

## 2010-10-19 LAB — COMPREHENSIVE METABOLIC PANEL
ALT: 20 U/L (ref 0–35)
AST: 25 U/L (ref 0–37)
Albumin: 4.2 g/dL (ref 3.5–5.2)
Alkaline Phosphatase: 134 U/L — ABNORMAL HIGH (ref 39–117)
BUN: 15 mg/dL (ref 6–23)
Calcium: 9.5 mg/dL (ref 8.4–10.5)
Chloride: 105 mEq/L (ref 96–112)
Potassium: 3.8 mEq/L (ref 3.5–5.3)
Sodium: 138 mEq/L (ref 135–145)

## 2010-10-19 LAB — CBC WITH DIFFERENTIAL/PLATELET
BASO%: 0.4 % (ref 0.0–2.0)
EOS%: 4.1 % (ref 0.0–7.0)
HGB: 14 g/dL (ref 11.6–15.9)
MCH: 31.2 pg (ref 25.1–34.0)
MCHC: 34.7 g/dL (ref 31.5–36.0)
MCV: 89.8 fL (ref 79.5–101.0)
MONO%: 7.7 % (ref 0.0–14.0)
RBC: 4.48 10*6/uL (ref 3.70–5.45)
RDW: 13.3 % (ref 11.2–14.5)
lymph#: 1.1 10*3/uL (ref 0.9–3.3)

## 2010-10-26 ENCOUNTER — Encounter (HOSPITAL_BASED_OUTPATIENT_CLINIC_OR_DEPARTMENT_OTHER): Payer: 59 | Admitting: Oncology

## 2010-10-26 DIAGNOSIS — R232 Flushing: Secondary | ICD-10-CM

## 2010-10-26 DIAGNOSIS — C50419 Malignant neoplasm of upper-outer quadrant of unspecified female breast: Secondary | ICD-10-CM

## 2010-10-26 DIAGNOSIS — Z170 Estrogen receptor positive status [ER+]: Secondary | ICD-10-CM

## 2010-11-21 NOTE — Op Note (Signed)
NAMELAURENASHLEY, Patricia Collier            ACCOUNT NO.:  [id number]   MEDICAL RECORD NO.:  [id number]          PATIENT TYPE:  AMB   LOCATION:  DSC                          FACILITY:  [facility]   PHYSICIAN:  Currie Paris, M.D.DATE OF BIRTH:  August 28, 1945   DATE OF PROCEDURE:  DATE OF DISCHARGE:                               OPERATIVE REPORT   PREOPERATIVE DIAGNOSIS:  Unneeded port.   POSTOPERATIVE DIAGNOSIS:  Unneeded port.   PROCEDURE:  Port-A-Cath removal.   SURGEON:  Currie Paris, MD   ANESTHESIA:  MAC.   CLINICAL HISTORY:  This is a 65 year old lady who has completed her  chemotherapy and wished to have her port removed.   DESCRIPTION OF PROCEDURE:  The patient was seen in the holding area and  had no further questions.  We identified the port site and marked it.   The patient was taken to the operating room.  After satisfactory IV  sedation had been obtained, the area around the port was prepped and  draped.  The time-out was done.   A combination of 1% Xylocaine with epi and 0.5% plain Marcaine was used  for local.  I infiltrated the area.  I opened the old scar.  The port  capsule was opened and the 3 Prolene holding sutures cut and removed.  The port was backed part way out.  A Vicryl suture was used to put a  figure-of-eight around the port tract and the tubing was pulled out  intact.  There was no backbleeding.   Incision was closed with 3-0 Vicryl, 4-0 Monocryl subcuticular, and  Dermabond.   The patient tolerated the procedure well and there were no  complications.  All counts were correct.      Currie Paris, M.D.  Electronically Signed     CJS/MEDQ  D:  06/21/2008  T:  06/21/2008  Job:  [id number]

## 2010-11-21 NOTE — Op Note (Signed)
Patricia Collier, Patricia Collier            ACCOUNT NO.:  [id number]   MEDICAL RECORD NO.:  [id number]          PATIENT TYPE:  AMB   LOCATION:  DSC                          FACILITY:  [facility]   PHYSICIAN:  Currie Paris, M.D.DATE OF BIRTH:  22-Dec-1945   DATE OF PROCEDURE:  11/18/2007  DATE OF DISCHARGE:                               OPERATIVE REPORT   PREOPERATIVE DIAGNOSIS:  Carcinoma, left breast, stage I.   POSTOPERATIVE DIAGNOSIS:  Carcinoma, left breast, stage I.   OPERATION:  Port-A-Cath placement for chemotherapy.   SURGEON:  Currie Paris, M.D.   ANESTHESIA:  MAC.   CLINICAL HISTORY:  This patient is a 61-year lady getting ready to begin  chemo for her breast cancer.   DESCRIPTION OF PROCEDURE:  The patient was seen in the holding area, and  she had no further questions.  We confirmed Port-A-Cath placement as the  planned procedure.   The patient was taken to the operating room and after satisfactory IV  sedation, the upper chest and lower neck were prepped and draped as a  sterile field.  The time-out was performed.   Xylocaine 1% plain was used for local and infiltrated in the right  infraclavicular fossa.  The subclavian vein was readily entered on  initial attempt, the guidewire threaded easily, and under fluoro into  the superior vena cava right atrial area.   Additional local was infiltrated on the anterior chest wall, and a  transverse incision made in a pocket fashion with cautery.  Port-A-Cath  tubing was pulled from that site into the guidewire site.  With the  patient in Trendelenburg again, the guidewire site was dilated once  through the dilator with peel-away sheath, and the dilator and guidewire  were removed.  This all done under fluoro control.  The catheter was  then introduced to about 17 cm where it appeared to be in the right  atrium.  The peel-away sheath was removed.  The catheter aspirated and  flushed easily.   Using fluoro we backed this  up to about 17-16 cm, at which point it  appeared to be in the distal SVC.  Again, it flushed in easily.  The  reservoir was then flushed, attached, and locking mechanism engaged.  This aspirated and flushed easily.  It was secured to the fascia with 3  sutures of 3-0 Prolene.  A final check for fluoro showed good  positioning and no kinks.   The reservoir was flushed with a concentrated aqueous heparin.  The  incision was then closed with 3-0 Vicryl, 4-0 Monocryl, subcuticular,  and Dermabond.   The patient tolerated the procedure well and there were no  complications.      Currie Paris, M.D.  Electronically Signed     CJS/MEDQ  D:  11/18/2007  T:  11/19/2007  Job:  [id number]

## 2010-11-21 NOTE — Op Note (Signed)
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   POSTOPERATIVE DIAGNOSIS:  Carcinoma of left breast upper inner quadrant,  clinical stage I.   OPERATION:  Left partial mastectomy with blue dye injection and axillary  sentinel lymph node biopsy; placement of MammoSite CED.   SURGEON:  Currie Paris, M.D.   ANESTHESIA:  General.   CLINICAL HISTORY:  This is a 61-year lady recently found to have a small  left breast cancer.  After lengthy discussion, the patient was elected  to have a lumpectomy (partial mastectomy) with sentinel lymph node  evaluation.  She initially would need to have radiation therapy and  after consultation with Dr. Mitzi Hansen elected to have placement of MammoSite  for possible MammoSite radiation as apposed for breast radiation.   DESCRIPTION OF PROCEDURE:  The patient seen in the holding area and had  no further questions.  We confirmed the left side was the operative  side, and the surgical plans as outlined above.  The left breast was  initialed.   The patient was taken to the operating room after satisfactory general  anesthesia had been obtained.  The left areolar area was prepped with  some alcohol, and a timeout was performed.  I then injected 5 mL of  methylene blue dye subareolarly.  This was massaged in.  Using NeoProbe,  I identified a hot area in the axilla and marked the overlying skin.   Full prep and drape of the breast was then done.  The incision was made  over the hot area in the axilla and some dissection showed tiny blue  lymph node that had counts about  200 with some blue dye entering it.  This was removed.  Careful palpation revealed no other firm or irregular  abnormal-appearing nodes.  I saw no other blue lymphatics.  The NeoProbe  counts were zero to five after removal of the single node.  Moist pack  was placed.  Attention turned to the breast.   The lesion was well palpable and felt fairly superficial, and I made an  elliptical incision directly over the mass with about 1 cm wide piece of  skin going medially until I was well beyond it and then laterally to  where the prior biopsy had been done to include  the tract of the  original core biopsy.   I divided the skin deep, and first superiorly in the medial half and  along around the medial edge and then inferior along the medial half.  This has gotten me more mobility.  I then divided the breast tissue down  to the chest wall along the entire superior aspect of the incision and  little bit around  the lateral.  With that done, I was able to then lift  up and divide the attachments to the muscle and come under the area and  then it surrounded this, so I was able to divide the final inferior  attachments on the nipple-areolar side.  Mass was palpable and seemed to  have good tissue moving in all directions around it.  It is felt to be  about 1.5 cm in size.   I irrigated and made sure everything was dry.  I closed a little bit of  the very lateral aspect of the incision where I had gone simply to get  the tract out and was well away from the tumor.  I then closed the subcu  little bit with a couple of layers of 3-0 Vicryl to try to leave the  cavity for the MammoSite.  Then while waiting for pathology, I went  ahead and closed the axillary incision with 3-0 Vicryl and 4-0 Monocryl  subcuticular.   At this point, Dr. Debby Bud reported that the sentinel lymph node was  negative.  I then opened the MammoSite device package and tested the  Mammsite CED.  It was inflated to 60 mL and  appeared intact.   I elected to put the MammoSite in through a counter incision that was  just lateral to the main incision, so a small nick in the skin was made,  and the trocar used to make the tract.  The MammoSite was slid through  the tract and into the pocket of the lumpectomy cavity.   The remaining incision was then closed in layers with 3-0 Vicryl and  then 4-0 Monocryl subcuticular.  I then inflated the balloon to 35 mL  which I thought filled the cavity nicely and was fairly tight on the  skin.  Using ultrasound, I had a 1-cm skin to balloon margin, which I  thought was adequate.   The patient tolerated the procedure well.  We used sterile dressings.  All counts were correct.      Currie Paris, M.D.  Electronically Signed     CJS/MEDQ  D:  10/17/2007  T:  10/18/2007  Job:  [id number]

## 2010-11-24 NOTE — Procedures (Signed)
[facility]  Patient:    Patricia Collier, Patricia Collier                   MRN: [id number] Proc. Date: 06/05/00 Adm. Date:  [id number] Disc. Date: [id number] Attending:  Kelvin Cellar CC:         Freddy Finner, M.D.   Procedure Report  PROCEDURE:  Colonoscopy.  ENDOSCOPIST:  Petra Kuba, M.D.  INDICATIONS:  Left lower quadrant pain, bright red blood per rectum.  Consent was signed after risks, benefits, methods, and options were thoroughly discussed in the office.  MEDICINES USED:  Demerol 100, Versed 10.  DESCRIPTION OF PROCEDURE:  Rectal was inspected and was pertinent for external hemorrhoids.  Digital exam was pertinent for some decreased sphincter tone. Pediatric video colonoscope was inserted, and unfortunately her sigmoid was too torturous and possibly fixed from adhesions.  It seemed to be strictured as well, slightly edematous, and we were unable to advance past 20 to 25 cm. The scope was withdrawn.  She was unable to hold air to retroflex back in the rectum.  No distal abnormalities were seen but the hemorrhoids.  We went ahead and inserted the upper endoscope.  With some mild difficulty, we were able to get around the sigmoid; however, she did have a long looping colon, and we did have to roll her on her back and then roll her on her right side to advance to the cecum.  This also required various abdominal pressures.  No obvious abnormality was seen on insertion.  The cecum was identified by the appendiceal orifice and the ileocecal valve.  In fact, the scope was inserted a short ways into the terminal ileum which was normal.  Photo documentation was obtained.  The scope was then slowly withdrawn.  The prep was adequate. There was some liquid stool that required washing and suctioning, but on slow withdrawal through the colon, no abnormalities were seen except for the tortuous, edematous, possibly strictured sigmoid.  No polypoid lesions, masses,  or other abnormalities were seen.  Again, we could not retroflex once back in the rectum due to inability to hold air.  The scope was removed.  The patient tolerated the procedure adequately.  There was no obvious immediate complication.  ENDOSCOPIC DIAGNOSIS: 1. Hemorrhoids. 2. 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  Unable to pass the video pediatric colonoscope but able to pass    the video upper endoscope. 3. Tortuous colon, otherwise within normal limits to the cecum and the    terminal ileum. 4. Unable to reflex due to inability to hold air.  PLAN: Consider surgical options, laparoscopic lysis of adhesions.  If further workup and plans are needed, could do a CT scan or barium enema to confirm the above.  I am happy to see back p.r.n., otherwise will let her discuss all of the above with Dr. Jennette Kettle, but I will be on standby to help p.r.n. DD:  06/05/00 TD:  06/05/00 Job: [id number]

## 2010-11-24 NOTE — Op Note (Signed)
Patricia Collier, Patricia Collier            ACCOUNT NO.:  [id number]   MEDICAL RECORD NO.:  [id number]          PATIENT TYPE:  AMB   LOCATION:  ENDO                         FACILITY:  [facility]   PHYSICIAN:  Petra Kuba, M.D.    DATE OF BIRTH:  December 29, 1945   DATE OF PROCEDURE:  06/05/2005  DATE OF DISCHARGE:                                 OPERATIVE REPORT   PROCEDURE:  Colonoscopy.   INDICATIONS FOR PROCEDURE:  Patient due for colonic screening with episodic  bright red blood per rectum.  Consent was signed after risks, benefits,  methods and options were thoroughly discussed multiple times in the past.   MEDICATIONS USED:  Fentanyl 175 mcg, Versed 14 mg.   DESCRIPTION OF PROCEDURE:  Rectal inspection is pertinent for possibly some  small prolapse and small external hemorrhoids only.  Digital exam was  negative.  Video pediatric adjustable colonoscope was inserted and due to a  tortuous sigmoid and a long looping colon, we were able to advance in the  cecum with abdominal pressure and rolling her on her back.  No abnormalities  were seen on insertion.  The cecum was identified by appendiceal orifice and  ileocecal valve and the scope was slowly withdrawn.  No blood was seen on  insertion or withdrawal.  The prep was fairly adequate.  There was a little  bit of stool balls which could not be washed or suctioned and some liquid  stool that was washed and suctioned.  On slow withdrawal through the colon,  no abnormalities but a rare left-sided diverticula were seen.  The sigmoid  was very tortuous and we did fall back around a tortuous loop.  We did try  to readvance around the curves to decrease changes of missing things.  The  rectum could not hold air and we were unable to retroflex.  Anorectal pull  through did reveal some tiny to small hemorrhoids only.  Scope was  straightened, air was suctioned.  Scope removed.  Patient tolerated the  procedure adequately.  There was no obvious immediate  complication.   ENDOSCOPIC DIAGNOSES:  1.  Tiny to small internal/external hemorrhoids possibly a little bit of      prolapse.  2.  Tortuous long looping colon.  3.  Rare left-sided diverticula.  4.  Otherwise within normal limits to the cecum without any blood being      seen.   PLAN:  Might consider a virtual colonoscopy next time for screening in five  or 10 years.  Happy to see back p.r.n.  Otherwise, return care to Dr. Jennette Kettle  for the customary maintenance and screening.           ______________________________  Petra Kuba, M.D.     MEM/MEDQ  D:  06/05/2005  T:  06/05/2005  Job:  [id number]   cc:   Freddy Finner, M.D.  Fax: [phone]

## 2010-11-24 NOTE — Assessment & Plan Note (Signed)
HISTORY:  Ms. Michaelson returns today.  She was initially scheduled for a left  sacroiliac joint injection under fluoroscopic guidance.  In the interval  time she has had the flu, became dehydrated, passed out on the floor, hit  the floor and got back up, stayed in bed for 2 days and now her hip pain has  been reduced from a 4/10 on average to a 2/10 on average.  She still has a  sore spot in her left buttock area but overall feeling okay, does not have  much in terms of lower extremity tingling anymore.  She has not started on  any new medications, in fact, she is taking just Motrin for the most part,  quit her Neurontin and Ultram.   She had an MRI on June 24, 2004 to include the pelvis.   REVIEW OF SYSTEMS:  Negative except for above.   VOCATIONAL:  Continues working full time.  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   EXAMINATION:  Blood pressure 113/60, pulse 88, respiratory rate is 20, O2  saturation 100% room air.   Back has no tenderness to palpation of PSIS; however, after I do FABER  testing the left SI joint is sore and I am able to palpate tenderness over  the PSIS.  She has full hip range of motion, normal knee, ankle, and hip  range of motion.  She has normal strength in bilateral lower extremities,  normal deep tendon reflexes bilateral lower extremities, normal sensation.   Review of MRI shows no significant abnormalities, certainly no S1 or L5  level problems.  She has minimal lateral recess stenosis L3-4 due to facet  overgrowth but once again no root compromise noted.  Tarlov cyst  incidentally noted S3.   IMPRESSION:  Left sacroiliac joint arthropathy improved.  I do not think she  warrants any injections at this time.  She is really off all medicines  except for some p.r.n. over-the-counter Motrin.  I will see the patient back  on an as-needed basis.  She will follow up with Dr. Kathrine Haddock.      Andr   AEK/MedQ  D:  06/29/2004 16:09:12   T:  06/30/2004 08:17:27  Job #:  [id number]

## 2010-11-24 NOTE — Op Note (Signed)
NAME:  Patricia Collier, Patricia Collier                      ACCOUNT NO.:  [id number]   MEDICAL RECORD NO.:  [id number]                   PATIENT TYPE:  OIB   LOCATION:  5704                                 FACILITY:  [facility]   PHYSICIAN:  Leonie Man, M.D.                DATE OF BIRTH:  1946-01-10   DATE OF PROCEDURE:  12/11/2002  DATE OF DISCHARGE:  12/12/2002                                 OPERATIVE REPORT   PREOPERATIVE DIAGNOSIS:  Cholelithiasis.   POSTOPERATIVE DIAGNOSIS:  Cholelithiasis.   PROCEDURE:  Laparoscopic cholecystectomy with intraoperative cholangiogram.   SURGEON:  Leonie Man, M.D.   ASSISTANT:  Joanne Gavel, M.D.   ANESTHESIA:  General.   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 She has  had an extensive cardiac and upper GI work up showing only cholelithiasis.  Liver function tests overall have been normal limits.  No hyperamylasemia or  hyperlipasemia. She comes to the operating room for a laparoscopic  cholecystectomy after the risks and potential benefits of surgery have been  fully discussed as well as the potential outcomes. She understands and gives  consent.   DESCRIPTION OF PROCEDURE:  Following the induction of satisfactory general  anesthesia the patient was positioned supinely. The abdomen was prepped and  draped routinely to be included in the sterile operative field. I  infiltrated the periumbilical, epigastric and  the lateral flank regions  with a 0.5% Marcaine with epinephrine. Periumbilical incision is made and  carried down into the peritoneum as an open laparoscopy. The peritoneum is  then insufflated to 14 mmHg pressure using carbon dioxide. Visual  exploration of the abdomen is carried out. The anterior gastric wall and  duodenal sweep appeared to be normal. The liver edges were sharp and  surfaces smooth. There were no adhesions to the  gallbladder. None of the  small or large intestines viewed appeared to be abnormal. Pelvic organs were  not visualized. Under direct vision the epigastric and lateral ports are  placed. The gallbladder is grasped and retracted cephalad with dissection  carried down into the region of the ampulla with isolation of the cystic  artery which is traced up to its entry into the gallbladder wall and into  the cystic duct which was traced to the gallbladder cystic duct junction.  The cystic artery was then doubly clipped and transected. The cystic duct  was clipped proximally and opened. I did a cholangiogram through the cystic  duct by passing a Reddick catheter into the abdomen through a 14 gauge  Angiocath. The resulting cholangiogram with the injection of 1/2 strength  Hypaque dye into the biliary system showed prompt flow of contrast into the  duodenum and intrahepatic biliary ducts of normal caliber and no evidence of  filling defects. The cystic duct catheter was then removed and the cystic  duct was then triply clipped and transected. The gallbladder was then  dissected free from the liver bed using a electrocautery and maintaining  hemostasis throughout the course of the dissection. At the end of the  dissection the liver bed was again thoroughly inspected and noted to be dry.  The right upper quadrant was thoroughly irrigated with normal saline. The  camera was retrieved through the epigastric port and the gallbladder was  retrieved through the umbilical port without difficulty. Sponge, instrument  and sharp counts were verified. The trocars were removed under direct  vision. The pneumoperitoneum was allowed to deflate and the wounds closed in  layers as follows. The umbilical wound in two layers was 0 Dexon and 4-0  Monocryl. Epigastric and lateral flank wounds were closed with 4-0  Monocryl sutures. All incisions were reinforced with Steri-Strips and  sterile dressings were applied.  Anesthetic reversed and the patient removed  from the operating room to the recovery room in stable condition. She  tolerated the procedure well.                                               Leonie Man, M.D.    PB/MEDQ  D:  12/11/2002  T:  12/12/2002  Job:  [id number]

## 2010-11-24 NOTE — Group Therapy Note (Signed)
REQUESTING PHYSICIAN:  Kathrine Haddock, M.D. at occupational medicine.   REASON FOR PHYSICAL MEDICINE AND REHABILITATION CONSULTATION:  Left  hip/buttock pain radiating into the left foot.   DATE OF INJURY:  June 2005.   MECHANISM OF INJURY:  Fall from chair, landing on left hip.   She has both pain that radiates down from the hip into her foot as well as  localized hip pain which is actually worse than the radiating pain.  She has  tried physical therapy and had two sessions where they were attempting  piriformis stretching as well as dropping her leg off the side of the bed to  attempt iliopsoas stretching both of these maneuvers resulted in increased  pain.  She did have a session of PT where they did just did some ultrasound  and this actually made her feel a bit better.  She is walking on a treadmill  from time to time and this does not hurt.  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  Other medications tried:  Surveyor, quantity.  She is on Ultram 50 p.o. b.i.d.  She  states this makes her a bit drowsy, so she does not like to use this at work  of course and the same with Neurontin.  On work days she likes to stay away  from the daytime usage.  The pain is averaging about 4 out of 10, going from  2 to 7.  She has poor sleep other than when she takes the medication and  even then it wears off in the middle of the night.   OTHER PAST MEDICAL HISTORY:  Bladder problems related to age.   OTHER HOSPITALIZATION HISTORY:  1.  Has had facial plastic surgery.  2.  She has had a __________  suction.  3.  She has had total abdominal hysterectomy, 1984.  4.  Sphincteroplasty in 1998 for bladder.   PHYSICAL EXAMINATION:  VITAL SIGNS:  Blood pressure 99/36, pulse 100,  respirations 16, O2 sat 97% on room air.  GAIT:  Shows no evidence of toe drag or knee instability.  She is able to  toe walk and heel walk.  AFFECT:  Alert.  APPEARANCE:  Normal.  BACK:  She has some minimal pain over the PSIS, actually is a little bit  inferior to this area.  Mild pain in the left gluteus medius area and less  so over the greater trochanter on the left side.  She has some pain at the  sciatic notch during straight leg raising, especially with active ankle  dorsiflexion.  EXTREMITIES:  She has full strength bilateral lower extremities.  She has  normal sensation in the right lower extremity.  On the left lower extremity  she has decreased S1 dermatome.  Deep tendon reflexes are normal  bilaterally.  She has good muscle bulk bilaterally.  No evidence of muscle  fasciculations.  Fabere testing radiates pain into the left buttock area,  appears to be in the SI joint area but it may be a bit lateral to it.   IMPRESSION:  1.  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  2.  Left S1 radiculopathy, likely has concomitant disk injury with focal      protrusion.   PLAN:  1.  We will get an SI joint injection.  2.  MRI lumbosacral spine, as I intend to  do a S1 nerve root injection I      need to delineate anatomy.  3.  After pain is under somewhat better control, I would consider re-      initiation of physical therapy with some more directives depending      diagnostic/therapeutic injections.  4.  Consider additional medication for sleep.  5.  Start Lidoderm patch over SI joint area on the left side, on 12, off 12.  6.  Spend some time delineating possible course of her injury.  7.  I will see her back for the injection.      AEK/MedQ  D:  06/09/2004 14:06:08  T:  06/09/2004 21:28:26  Job #:  [id number]

## 2011-04-03 LAB — CBC
HCT: 44.9
MCV: 89.9
Platelets: 237
RDW: 12.6

## 2011-04-03 LAB — COMPREHENSIVE METABOLIC PANEL
Albumin: 4.3
BUN: 14
Calcium: 9.7
Creatinine, Ser: 0.77
Potassium: 4.3
Total Protein: 7.1

## 2011-04-03 LAB — URINALYSIS, ROUTINE W REFLEX MICROSCOPIC
Glucose, UA: NEGATIVE
Nitrite: NEGATIVE
Specific Gravity, Urine: 1.006
pH: 7

## 2011-04-03 LAB — DIFFERENTIAL
Lymphocytes Relative: 17
Lymphs Abs: 1
Monocytes Absolute: 0.6
Monocytes Relative: 9
Neutro Abs: 4.3

## 2011-04-12 LAB — POCT HEMOGLOBIN-HEMACUE: Hemoglobin: 14.3 g/dL (ref 12.0–15.0)

## 2011-06-19 ENCOUNTER — Telehealth: Payer: Self-pay | Admitting: Oncology

## 2011-06-19 NOTE — Telephone Encounter (Signed)
I was called today by Dr. Abbey Chatters Re: Patricia Collier. She is very distressed because of vaginal dryness issues. She is terrified of using  local estrogens for this. Recall that she was very weakly estrogen receptor positive at 6%, very weakly progesterone receptor positive at 2%. She was unable to tolerate either tamoxifen or aromatase inhibitors   I told Dr. Jennette Kettle is that it would be ideal if she could tolerate some tamoxifen, perhaps 10 mg daily. This would allow her to receive a local estrogen creams with a minimal of anxiety. If she cannot tolerate the tamoxifen, then the fact as we we do not have data that estrogen alone increases the risk of breast cancer, much less date for local estrogen preparation to doing this. Of course we do not know 100% that this is safe, but certainly when we have a significant quality-of-life issue like this I would not all be uncomfortable with the patient using Vagifem suppositories or ESTRING as needed for symptom control.

## 2011-09-26 ENCOUNTER — Telehealth: Payer: Self-pay | Admitting: Oncology

## 2011-09-26 ENCOUNTER — Other Ambulatory Visit: Payer: Self-pay | Admitting: Oncology

## 2011-09-26 DIAGNOSIS — Z853 Personal history of malignant neoplasm of breast: Secondary | ICD-10-CM

## 2011-09-26 NOTE — Telephone Encounter (Signed)
Pt called to schedule her April 2013 appts °

## 2011-10-12 ENCOUNTER — Ambulatory Visit
Admission: RE | Admit: 2011-10-12 | Discharge: 2011-10-12 | Disposition: A | Discharge status: Home / Self Care | Payer: 59 | Source: Ambulatory Visit | Attending: Oncology | Admitting: Oncology

## 2011-10-12 DIAGNOSIS — Z853 Personal history of malignant neoplasm of breast: Secondary | ICD-10-CM

## 2011-10-24 ENCOUNTER — Other Ambulatory Visit: Payer: 59 | Admitting: Lab

## 2011-10-25 ENCOUNTER — Other Ambulatory Visit (HOSPITAL_BASED_OUTPATIENT_CLINIC_OR_DEPARTMENT_OTHER): Payer: 59

## 2011-10-25 DIAGNOSIS — Z170 Estrogen receptor positive status [ER+]: Secondary | ICD-10-CM

## 2011-10-25 DIAGNOSIS — C50219 Malignant neoplasm of upper-inner quadrant of unspecified female breast: Secondary | ICD-10-CM

## 2011-10-25 LAB — COMPREHENSIVE METABOLIC PANEL
ALT: 12 U/L (ref 0–35)
AST: 17 U/L (ref 0–37)
Calcium: 9.1 mg/dL (ref 8.4–10.5)
Chloride: 106 mEq/L (ref 96–112)
Creatinine, Ser: 0.67 mg/dL (ref 0.50–1.10)
Potassium: 4.2 mEq/L (ref 3.5–5.3)
Sodium: 141 mEq/L (ref 135–145)
Total Protein: 6.5 g/dL (ref 6.0–8.3)

## 2011-10-25 LAB — CBC WITH DIFFERENTIAL/PLATELET
BASO%: 1 % (ref 0.0–2.0)
Eosinophils Absolute: 0.2 10*3/uL (ref 0.0–0.5)
MCHC: 34.1 g/dL (ref 31.5–36.0)
MONO#: 0.4 10*3/uL (ref 0.1–0.9)
NEUT#: 4.5 10*3/uL (ref 1.5–6.5)
RBC: 4.5 10*6/uL (ref 3.70–5.45)
RDW: 13.5 % (ref 11.2–14.5)
WBC: 6.1 10*3/uL (ref 3.9–10.3)
lymph#: 0.9 10*3/uL (ref 0.9–3.3)
nRBC: 0 % (ref 0–0)

## 2011-10-29 ENCOUNTER — Ambulatory Visit: Payer: 59 | Admitting: Oncology

## 2011-11-19 ENCOUNTER — Telehealth: Payer: Self-pay | Admitting: *Deleted

## 2011-11-19 ENCOUNTER — Ambulatory Visit (HOSPITAL_BASED_OUTPATIENT_CLINIC_OR_DEPARTMENT_OTHER): Payer: 59 | Admitting: Oncology

## 2011-11-19 VITALS — BP 118/80 | HR 46 | Temp 98.3°F | Ht 64.0 in | Wt 145.4 lb

## 2011-11-19 DIAGNOSIS — M25559 Pain in unspecified hip: Secondary | ICD-10-CM

## 2011-11-19 DIAGNOSIS — Z170 Estrogen receptor positive status [ER+]: Secondary | ICD-10-CM

## 2011-11-19 DIAGNOSIS — Z853 Personal history of malignant neoplasm of breast: Secondary | ICD-10-CM | POA: Insufficient documentation

## 2011-11-19 DIAGNOSIS — C50919 Malignant neoplasm of unspecified site of unspecified female breast: Secondary | ICD-10-CM

## 2011-11-19 DIAGNOSIS — C50219 Malignant neoplasm of upper-inner quadrant of unspecified female breast: Secondary | ICD-10-CM

## 2011-11-19 DIAGNOSIS — R0602 Shortness of breath: Secondary | ICD-10-CM

## 2011-11-19 NOTE — Progress Notes (Signed)
ID: Patricia Collier   DOB: 11/19/1945  MR#: [id number]  WUJ#:[id number]  HISTORY OF PRESENT ILLNESS: She is very regular in performing breast self exams and when she did that in early February 2009, she noted a difference.  It was not very well defined, but nevertheless it was different from before.  She brought this to Dr. Donnetta Hail attention, and he set her up for bilateral diagnostic mammography at [facility] on 09-10-07.  There was a spiculated mass with some faint calcifications in the upper portion of the left breast which on physical examination was palpable.  By ultrasound it was spiculated, solid, taller than wide, and measured up to 11 mm.  The left axilla was fine as was the right breast and axilla.  This was discussed with Dr. Deboraha Sprang and biopsy was performed the same day, showing ([id number] and [phone]) an invasive ductal carcinoma which was 6% ER positive, 2% PR positive with a borderline MIB-1 at 18% and HercepTest negative at 0%.    With this information, the patient was referred to Dr. Jamey Ripa and bilateral breast MRI's were obtained on 09-19-07.  This basically showed only the solitary abnormality in question (upper inner quadrant of the left breast) measuring 1.4 cm. by MRI.    With this information, after appropriate discussion, the patient proceeded to left lumpectomy with sentinel lymph node biopsy on 10-17-07.  The final pathology [id number]) showed a 1.2 cm. grade 3 invasive ductal carcinoma with no evidence of lymphovascular invasion (although suspicion of lymphovascular invasion had been noted from the biopsy core), with ample margins and the sentinel lymph node negative for tumor spread.  Her subsequent history is as detailed below.  INTERVAL HISTORY: Patricia Collier returns today for routine followup of her breast cancer. She continues to work of at [employer], currently 0.8, still night shift. They continue to be significant family stresses, but on the plus side, she took a trip to [place] and also a cruise with her husband and 10-1/2-year-old grandson which was very successful.  REVIEW OF SYSTEMS: She is having more pain in her right hip area. Just sitting here it's a 4/10, sometimes it gets to be an 8/10. This is clearly worsening. Something else she has noted is that when she goes up a slope or a set of stairs she is more short of breath than she was previously. There is no angina or palpitations. Otherwise a detailed review of systems was noncontributory  PAST MEDICAL HISTORY: 1. Remote history of tobacco use, the patient definitively quitting smoking 17 years ago. 2. Status post cholecystectomy. 3. Status post tonsillectomy and adenoidectomy. 4. Status post surgery to the right eye involving right cataract surgery. 5. Status post septoplasty. 6. Status post "face lift".  7. History of bilateral salpingo-oophorectomy in 2000. 8. History of hysterectomy in 1984. 9. History of laparoscopy for lysis of adhesions in 1991 and 1984.  10. Eczema. 11. Eclampsia with her pregnancy in 1977.  12. Borderline vitamin D deficiency. 13. History of SVT  FAMILY HISTORY The patient's father died at the age of 90 from heart failure.  The patient's mother died at age 49 also with heart problems.  The patient is one of ten siblings, four half-brothers and three half-sisters.  All on the father's side have died, one of those sisters died from breast cancer about 30 years ago, she was 8 at the time of diagnosis and lived about a year.  The patient's full brother is age 66, he has multiple skin  cancers and has required radical neck surgery as a result.  There is no history of breast or ovarian cancer in the family otherwise.    GYNECOLOGIC HISTORY: She is GX P2, first pregnancy age 6.  She took hormone replacement for approximately 15 years, ending on 09-06-07.    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  Her husband of 19 years, Patricia Collier, owns a business  that provides fire sprinklers to buildings.  Patricia Collier has two children from a prior marriage - Patricia Collier is 51, he works with Child psychotherapist and he is also a Technical sales engineer; her older daughter, Patricia Collier, lives in [place], is divorced and may be bipolar.  She has a grandchild, Patricia Collier,nine years old, who Patricia Collier is pretty much crazy about.   Patricia Collier attends [employer] currently. 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   ADVANCED DIRECTIVES: in place  HEALTH MAINTENANCE: History  Substance Use Topics  . Smoking status: Not on file  . Smokeless tobacco: Not on file  . Alcohol Use: Not on file     Colonoscopy: 2013  PAP: Patricia Collier  Bone density:  Lipid panel:  Allergies  Allergen Reactions  . Hydrocodone-Acetaminophen     REACTION: itching    Current Outpatient Prescriptions  Medication Sig Dispense Refill  . phentermine 37.5 MG capsule Take 37.5 mg by mouth as needed.        OBJECTIVE: Middle-aged white woman in no acute distress Filed Vitals:   11/19/11 1022  BP: 118/80  Pulse: 46  Temp: 98.3 F (36.8 C)     Body mass index is 24.96 kg/(m^2).    ECOG FS: 0  Sclerae unicteric Oropharynx clear No peripheral adenopathy Lungs no rales or rhonchi Heart regular rate and rhythm Abd benign MSK no focal spinal tenderness, no peripheral edema Neuro: nonfocal Breasts: Right breast is unremarkable; left breast is status post lumpectomy, no evidence of local recurrence  LAB RESULTS: Lab Results  Component Value Date   WBC 6.1 10/25/2011   NEUTROABS 4.5 10/25/2011   HGB 13.9 10/25/2011   HCT 40.7 10/25/2011   MCV 90.5 10/25/2011   PLT 198 10/25/2011      Chemistry      Component Value Date/Time   NA 141 10/25/2011 1222   K 4.2 10/25/2011 1222   CL 106 10/25/2011 1222   CO2 25 10/25/2011 1222   BUN 17 10/25/2011 1222   CREATININE 0.67 10/25/2011 1222      Component Value Date/Time   CALCIUM 9.1 10/25/2011 1222   ALKPHOS 124* 10/25/2011 1222   AST 17 10/25/2011 1222   ALT 12 10/25/2011 1222   BILITOT  0.5 10/25/2011 1222       Lab Results  Component Value Date   LABCA2 18 10/19/2010    No components found with this basename: [id number]    No results found for this basename: INR:1;PROTIME:1 in the last 168 hours  Urinalysis    Component Value Date/Time   COLORURINE YELLOW 10/16/2007 0859   APPEARANCEUR CLEAR 10/16/2007 0859   LABSPEC 1.015 01/19/2008 1209   LABSPEC 1.006 10/16/2007 0859   PHURINE 7.0 10/16/2007 0859   GLUCOSEU NEGATIVE 10/16/2007 0859   HGBUR NEGATIVE 10/16/2007 0859   BILIRUBINUR NEGATIVE 10/16/2007 0859   KETONESUR NEGATIVE 10/16/2007 0859   PROTEINUR NEGATIVE 10/16/2007 0859   UROBILINOGEN 0.2 10/16/2007 0859   NITRITE NEGATIVE 10/16/2007 0859   LEUKOCYTESUR NEGATIVE MICROSCOPIC NOT DONE ON URINES WITH NEGATIVE PROTEIN, BLOOD, LEUKOCYTES, NITRITE, OR GLUCOSE <1000 mg/dL. 10/16/2007 4782  STUDIES: Mammography April 2013 unremarkable  ASSESSMENT: 66 year old [place] woman status post left lumpectomy and sentinel lymph node biopsy April 2009 for a T1cN0, grade 3, invasive ductal carcinoma, ER 6% and PR 2% "positive," with an MIB-1 of 18%, no HER-2 amplification.  She received dose-dense doxorubicin and cyclophosphamide x4, then weekly paclitaxel x12.  After radiation therapy completed December 2009, she was tried on tamoxifen and letrozole with very poor tolerance.  She is now followed off treatment.  PLAN: I am a bit concerned about her family history and slightly worsening shortness of breath, and we discussed referral to cardiology for evaluation. I suggested she see Patricia Collier regarding this. She is agreeable. As far as the right hip is concerned we will obtain plain films and depending on results I will probably refer her to ortho.  As far as breast cancer is concerned she is doing well. She will see me again in one year. She knows to call for any problems that may develop before the next visit   Dainel Arcidiacono C    11/19/2011

## 2011-11-19 NOTE — Telephone Encounter (Signed)
made patient appointment for 11-2012 printed out calendar and gave to the patient

## 2011-11-30 ENCOUNTER — Telehealth: Payer: Self-pay | Admitting: *Deleted

## 2011-11-30 ENCOUNTER — Other Ambulatory Visit: Payer: Self-pay | Admitting: *Deleted

## 2011-11-30 ENCOUNTER — Other Ambulatory Visit: Payer: Self-pay | Admitting: Oncology

## 2011-11-30 NOTE — Telephone Encounter (Signed)
waiting on md to put in an echo order called patient and informed the patient to please call me back md has ordered patient to have an x-ray on hip

## 2011-11-30 NOTE — Telephone Encounter (Signed)
patient called back on 11-30-2011  I informed her of the x-ray orders for her hip to be x-ray the patient explained [place] is at the beach she will be back the first week of June patient also asked md to order her a stress test

## 2011-12-04 ENCOUNTER — Other Ambulatory Visit: Payer: Self-pay | Admitting: Oncology

## 2011-12-04 DIAGNOSIS — C50919 Malignant neoplasm of unspecified site of unspecified female breast: Secondary | ICD-10-CM

## 2011-12-05 ENCOUNTER — Other Ambulatory Visit (HOSPITAL_COMMUNITY): Payer: 59

## 2011-12-05 ENCOUNTER — Other Ambulatory Visit: Payer: Self-pay | Admitting: Oncology

## 2011-12-05 ENCOUNTER — Other Ambulatory Visit: Payer: Self-pay | Admitting: *Deleted

## 2011-12-05 DIAGNOSIS — C50919 Malignant neoplasm of unspecified site of unspecified female breast: Secondary | ICD-10-CM

## 2011-12-12 ENCOUNTER — Ambulatory Visit (HOSPITAL_COMMUNITY)
Admission: RE | Admit: 2011-12-12 | Discharge: 2011-12-12 | Disposition: A | Discharge status: Home / Self Care | Payer: 59 | Source: Ambulatory Visit | Attending: Oncology | Admitting: Oncology

## 2011-12-12 DIAGNOSIS — M25559 Pain in unspecified hip: Secondary | ICD-10-CM | POA: Insufficient documentation

## 2011-12-12 DIAGNOSIS — C50919 Malignant neoplasm of unspecified site of unspecified female breast: Secondary | ICD-10-CM | POA: Insufficient documentation

## 2011-12-19 ENCOUNTER — Ambulatory Visit (HOSPITAL_COMMUNITY)
Admission: RE | Admit: 2011-12-19 | Discharge: 2011-12-19 | Disposition: A | Discharge status: Home / Self Care | Payer: 59 | Source: Ambulatory Visit | Attending: Oncology | Admitting: Oncology

## 2011-12-19 DIAGNOSIS — I080 Rheumatic disorders of both mitral and aortic valves: Secondary | ICD-10-CM | POA: Insufficient documentation

## 2011-12-19 DIAGNOSIS — I079 Rheumatic tricuspid valve disease, unspecified: Secondary | ICD-10-CM | POA: Insufficient documentation

## 2011-12-19 DIAGNOSIS — Z01818 Encounter for other preprocedural examination: Secondary | ICD-10-CM | POA: Insufficient documentation

## 2011-12-19 DIAGNOSIS — C50919 Malignant neoplasm of unspecified site of unspecified female breast: Secondary | ICD-10-CM | POA: Insufficient documentation

## 2011-12-19 DIAGNOSIS — I359 Nonrheumatic aortic valve disorder, unspecified: Secondary | ICD-10-CM

## 2011-12-19 NOTE — Progress Notes (Signed)
  Echocardiogram 2D Echocardiogram has been performed.  Patricia Collier L 12/19/2011, 9:20 AM

## 2011-12-27 ENCOUNTER — Telehealth (HOSPITAL_COMMUNITY): Payer: Self-pay | Admitting: Internal Medicine

## 2011-12-27 ENCOUNTER — Encounter (HOSPITAL_COMMUNITY): Payer: Self-pay

## 2011-12-27 ENCOUNTER — Ambulatory Visit (HOSPITAL_COMMUNITY)
Admission: RE | Admit: 2011-12-27 | Discharge: 2011-12-27 | Disposition: A | Discharge status: Home / Self Care | Payer: 59 | Source: Ambulatory Visit | Attending: Internal Medicine | Admitting: Internal Medicine

## 2011-12-27 VITALS — BP 118/58 | HR 110 | Resp 18 | Ht 64.0 in | Wt 147.0 lb

## 2011-12-27 DIAGNOSIS — R0609 Other forms of dyspnea: Secondary | ICD-10-CM | POA: Insufficient documentation

## 2011-12-27 DIAGNOSIS — R0989 Other specified symptoms and signs involving the circulatory and respiratory systems: Secondary | ICD-10-CM | POA: Insufficient documentation

## 2011-12-27 DIAGNOSIS — R079 Chest pain, unspecified: Secondary | ICD-10-CM | POA: Insufficient documentation

## 2011-12-27 DIAGNOSIS — R0600 Dyspnea, unspecified: Secondary | ICD-10-CM

## 2011-12-27 DIAGNOSIS — R0602 Shortness of breath: Secondary | ICD-10-CM

## 2011-12-27 DIAGNOSIS — C50919 Malignant neoplasm of unspecified site of unspecified female breast: Secondary | ICD-10-CM | POA: Insufficient documentation

## 2011-12-27 NOTE — Assessment & Plan Note (Addendum)
Medical records reviewed. Reviewed ECHOs with Maggie during clinic visit. Abnormal ECHO EF reduced and exertional dyspnea and chest tightness. Check PFTs and treadmill myoview. Follow up in one month.   Patient seen and examined with Tonye Becket, NP. We discussed all aspects of the encounter. I agree with the assessment and plan as stated above. Etiology of dyspnea is unclear to me. No evidence of overt HF on exam however echo (which was reviewed personally in clinic) is abnormal with mildly reduced EF and regional wall motion abnormality. Will check treadmill Myoview and PFTs. If Myoview + will need cath.

## 2011-12-27 NOTE — Patient Instructions (Addendum)
Your physician has requested that you have en exercise stress myoview. For further information please visit [email or web address] Please follow instruction sheet, as given.  Your physician has recommended that you have a pulmonary function test. Pulmonary Function Tests are a group of tests that measure how well air moves in and out of your lungs.  Your physician recommends that you schedule a follow-up appointment in:

## 2011-12-27 NOTE — Progress Notes (Signed)
Patient ID: Patricia Collier, female   DOB: 1945-08-20, 66 y.o.   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 She received dose-dense doxorubicin and cyclophosphamide x4, then weekly paclitaxel x12. She received 7 weeks of  radiation therapy completed December 2009, she was tried on tamoxifen and letrozole with very poor tolerance. Currently followed Dr Darnelle Catalan off treatment. She also has history of SVT noted in 2011. Otherwise she denies any known cardiac history.  She is referred by Dr Darnelle Catalan for exertional dyspnea. She is reports progressive dyspnea walking up inclined surfaces that is worse over  the last year. Occasional tightness in her chest with exertion after 2-3 flights of stairs. Denies chest tightness at rest. Quit smoking 20 years ago. Works full time as Scientist, clinical (histocompatibility and immunogenetics) at [employer]. She has had one episode of SVT rate > 200 (05/2010). Denies lower extremity edema.  Has not had stress test or cath.  ECHO 11/2006 EF 55-60%  ECHO 6/ 2013 EF 45-50% Lateral 12.4 mid distal septal flattening hypokinesis mild AS   Review of Systems:     Cardiac Review of Systems: {Y] = yes [ ]  = no  Chest Pain [    ]  Resting SOB [   ] Exertional SOB  [ Y ]  Orthopnea [  ]   Pedal Edema [   ]    Palpitations [  ] Syncope  [  ]   Presyncope [   ]  General Review of Systems: [Y] = yes [  ]=no Constitional: recent weight change [  ]; anorexia [  ]; fatigue [  ]; nausea [  ]; night sweats [  ]; fever [  ]; or chills [  ];                                                                                                                                          Dental: poor dentition[  ]; Last Dentist visit:   Eye : blurred vision [  ]; diplopia [   ];  vision changes [  ];  Amaurosis fugax[  ]; Resp: cough [  ];  wheezing[  ];  hemoptysis[  ]; shortness of breath[ Y ]; paroxysmal nocturnal dyspnea[  ]; dyspnea on exertion[ Y ]; or orthopnea[  ];  GI:  gallstones[  ], vomiting[  ];  dysphagia[  ]; melena[  ];  hematochezia [  ]; heartburn[  ];   Hx of  Colonoscopy[  ]; GU: kidney stones [  ]; hematuria[  ];   dysuria [  ];  nocturia[  ];  history of     obstruction [  ];  Skin: rash, swelling[  ];, hair loss[  ];  peripheral edema[  ];  or itching[  ]; Musculosketetal: myalgias[  ];  joint swelling[  ];  joint erythema[  ];  joint pain[  ];  back pain[  ];  Heme/Lymph: bruising[  ];  bleeding[  ];  anemia[  ];  Neuro: TIA[  ];  headaches[  ];  stroke[  ];  vertigo[  ];  seizures[  ];   paresthesias[  ];  difficulty walking[  ];  Psych:depression[  ]; anxiety[  ];  Endocrine: diabetes[  ];  thyroid dysfunction[  ];  Immunizations: Flu [  ]; Pneumococcal[  ];  Other:    No past medical history on file.  Current Outpatient Prescriptions  Medication Sig Dispense Refill  . phentermine 37.5 MG capsule Take 37.5 mg by mouth as needed.         Allergies  Allergen Reactions  . Hydrocodone-Acetaminophen     REACTION: itching    History   Social History  . Marital Status: Married    Spouse Name: N/A    Number of Children: N/A  . Years of Education: N/A   Occupational History  . Not on file.   Social History Main Topics  . Smoking status: Former Games developer  . Smokeless tobacco: Not on file   Comment: 12 ys ago.  . Alcohol Use: Not on file  . Drug Use: Not on file  . Sexually Active: Not on file   Other Topics Concern  . Not on file   Social History Narrative  . No narrative on file    Family History  Problem Relation Age of Onset  . Heart disease Mother   . Hypertension Father     PHYSICAL EXAM: Filed Vitals:   12/27/11 1441  BP: 118/58  Pulse: 110  Resp: 18   General:  Well appearing. No  respiratory difficulty HEENT: normal Neck: supple. no JVD. Carotids 2+ bilat; no bruits. No lymphadenopathy or thryomegaly appreciated. Cor: PMI nondisplaced. Regular rate & rhythm. No rubs, gallops or AM radiates to caroitids Lungs: clear Abdomen: soft, nontender, nondistended. No hepatosplenomegaly. No bruits or masses. Good bowel sounds. Extremities: no cyanosis, clubbing, rash, edema Neuro: alert & oriented x 3, cranial nerves grossly intact. moves all 4 extremities w/o difficulty. Affect pleasant.  ECG: Sinus Tach 103 No ST-T wave abnormalities.   ASSESSMENT & PLAN:

## 2012-01-07 ENCOUNTER — Ambulatory Visit (HOSPITAL_COMMUNITY)
Admission: RE | Admit: 2012-01-07 | Discharge: 2012-01-07 | Disposition: A | Discharge status: Home / Self Care | Payer: 59 | Source: Ambulatory Visit | Attending: Oncology | Admitting: Oncology

## 2012-01-07 DIAGNOSIS — R079 Chest pain, unspecified: Secondary | ICD-10-CM | POA: Insufficient documentation

## 2012-01-07 DIAGNOSIS — R0600 Dyspnea, unspecified: Secondary | ICD-10-CM

## 2012-01-07 DIAGNOSIS — R0609 Other forms of dyspnea: Secondary | ICD-10-CM | POA: Insufficient documentation

## 2012-01-07 DIAGNOSIS — R0989 Other specified symptoms and signs involving the circulatory and respiratory systems: Secondary | ICD-10-CM | POA: Insufficient documentation

## 2012-01-07 LAB — PULMONARY FUNCTION TEST

## 2012-01-09 ENCOUNTER — Ambulatory Visit (HOSPITAL_COMMUNITY): Payer: 59 | Attending: Cardiology | Admitting: Radiology

## 2012-01-09 VITALS — BP 105/82 | Ht 64.0 in | Wt 144.0 lb

## 2012-01-09 DIAGNOSIS — Z853 Personal history of malignant neoplasm of breast: Secondary | ICD-10-CM | POA: Insufficient documentation

## 2012-01-09 DIAGNOSIS — R5381 Other malaise: Secondary | ICD-10-CM | POA: Insufficient documentation

## 2012-01-09 DIAGNOSIS — R079 Chest pain, unspecified: Secondary | ICD-10-CM

## 2012-01-09 DIAGNOSIS — R0609 Other forms of dyspnea: Secondary | ICD-10-CM

## 2012-01-09 DIAGNOSIS — R000 Tachycardia, unspecified: Secondary | ICD-10-CM | POA: Insufficient documentation

## 2012-01-09 DIAGNOSIS — R9439 Abnormal result of other cardiovascular function study: Secondary | ICD-10-CM | POA: Insufficient documentation

## 2012-01-09 DIAGNOSIS — Z8249 Family history of ischemic heart disease and other diseases of the circulatory system: Secondary | ICD-10-CM | POA: Insufficient documentation

## 2012-01-09 DIAGNOSIS — R61 Generalized hyperhidrosis: Secondary | ICD-10-CM | POA: Insufficient documentation

## 2012-01-09 DIAGNOSIS — R0989 Other specified symptoms and signs involving the circulatory and respiratory systems: Secondary | ICD-10-CM | POA: Insufficient documentation

## 2012-01-09 DIAGNOSIS — Z87891 Personal history of nicotine dependence: Secondary | ICD-10-CM | POA: Insufficient documentation

## 2012-01-09 DIAGNOSIS — R0600 Dyspnea, unspecified: Secondary | ICD-10-CM

## 2012-01-09 DIAGNOSIS — R0789 Other chest pain: Secondary | ICD-10-CM | POA: Insufficient documentation

## 2012-01-09 DIAGNOSIS — R110 Nausea: Secondary | ICD-10-CM | POA: Insufficient documentation

## 2012-01-09 DIAGNOSIS — E785 Hyperlipidemia, unspecified: Secondary | ICD-10-CM

## 2012-01-09 DIAGNOSIS — Z9221 Personal history of antineoplastic chemotherapy: Secondary | ICD-10-CM | POA: Insufficient documentation

## 2012-01-09 DIAGNOSIS — R0602 Shortness of breath: Secondary | ICD-10-CM

## 2012-01-09 MED ORDER — TECHNETIUM TC 99M TETROFOSMIN IV KIT
10.0000 | PACK | Freq: Once | INTRAVENOUS | Status: AC | PRN
  Administered 2012-01-09: 10 via INTRAVENOUS

## 2012-01-09 MED ORDER — TECHNETIUM TC 99M TETROFOSMIN IV KIT
30.0000 | PACK | Freq: Once | INTRAVENOUS | Status: AC | PRN
  Administered 2012-01-09: 30 via INTRAVENOUS

## 2012-01-09 NOTE — Progress Notes (Signed)
[facility] SITE 3 NUCLEAR MED [address] [place] [address] [phone]  Cardiology Nuclear Med Study  Patricia Collier is a 66 y.o. female     MRN : [id number]     DOB: 17-May-1946  Procedure Date: 01/09/2012  Nuclear Med Background Indication for Stress Test:  Evaluation for Ischemia History:  No prior known history of CAD, '08 Myocardial Perfusion Study:(-) ischemia, (-) scar EF=49%, '09 Breast cancer with chemo, '11 PSVT, and 6/13 Echo: EF=45-50% Cardiac Risk Factors: Family History - CAD and History of Smoking  Symptoms: Chest Pressure and Chest Tightness with/without exertion (last occurrence this am, 5 minute duration relieved after deep breaths)radiates to (L) shoulderblade, Diaphoresis, DOE, Fatigue, Fatigue with Exertion, Nausea and Rapid HR   Nuclear Pre-Procedure Caffeine/Decaff Intake:  None NPO After: 7:00pm   Lungs:  clear O2 Sat: 98% on room air. IV 0.9% NS with Angio Cath:  22g  IV Site: R Antecubital  IV Started by:  Stanton Kidney, EMT-P  Chest Size (in):  38 Cup Size: D  Height: 5\' 4"  (1.626 m)  Weight:  144 lb (65.318 kg)  BMI:  Body mass index is 24.72 kg/(m^2). Tech Comments:  NA    Nuclear Med Study 1 or 2 day study: 1 day  Stress Test Type:  Stress  Reading MD: Willa Rough, MD  Order Authorizing Provider:  Arvilla Meres, MD  Resting Radionuclide: Technetium 70m Tetrofosmin  Resting Radionuclide Dose: 11.0 mCi   Stress Radionuclide:  Technetium 83m Tetrofosmin  Stress Radionuclide Dose: 33.0 mCi           Stress Protocol Rest HR: 89 Stress HR: 169  Rest BP: 105/82 Stress BP: 132/76  Exercise Time (min): 9:46 METS: 11.1   Predicted Max HR: 157 bpm % Max HR: 107.64 bpm Rate Pressure Product: 21308   Dose of Adenosine (mg):  n/a Dose of Lexiscan: n/a mg  Dose of Atropine (mg): n/a Dose of Dobutamine: n/a mcg/kg/min (at max HR)  Stress Test Technologist: Irean Hong, RN  Nuclear Technologist:  Doyne Keel, CNMT      Rest Procedure:  Myocardial perfusion imaging was performed at rest 45 minutes following the intravenous administration of Technetium 36m Tetrofosmin. Rest ECG: NSR with nonspecific ST changes,Poor R wave progression  Stress Procedure:  The patient performed treadmill exercise using a Bruce  Protocol for 9 minutes and 46 seconds, RPE=16 minutes. The patient stopped due to DOE and complained of chest tightness 4/10 in recovery. There were nonspecific ST-T wave changes.  Technetium 14m Tetrofosmin was injected at peak exercise and myocardial perfusion imaging was performed after a brief delay. Stress ECG: Insignificant upsloping ST segment depression.  QPS Raw Data Images:  Normal; no motion artifact; normal heart/lung ratio. Stress Images:  Small, moderate mid to apical anterior perfusion defect.  Rest Images:  Small, mild mid to apical anterior perfusion defect.  Subtraction (SDS):  Partially reversible small mid to apical anterior perfusion defect.  Transient Ischemic Dilatation (Normal <1.22):  0.87 Lung/Heart Ratio (Normal <0.45):  0.29  Quantitative Gated Spect Images QGS EDV:  66 ml QGS ESV:  33 ml  Impression Exercise Capacity:  Good exercise capacity. BP Response:  Normal blood pressure response. Clinical Symptoms:  Chest tightness.  ECG Impression:  Insignificant upsloping ST segment depression. Comparison with Prior Nuclear Study: Prior study was read as normal.   Overall Impression:  Abnormal stress nuclear study.  There was a small, partially reversible mid to apical anterior perfusion defect.  Possible infarction with peri-infarct ischemia given some wall motion abnormality.  Cannot completely rule out shifting breast attenuation.   LV Ejection Fraction: 51%.  LV Wall Motion:  Apical anteroseptal hypokinesis.   Marca Ancona 01/09/2012

## 2012-02-11 ENCOUNTER — Encounter (HOSPITAL_COMMUNITY): Payer: Self-pay

## 2012-02-11 ENCOUNTER — Other Ambulatory Visit (HOSPITAL_COMMUNITY): Payer: Self-pay | Admitting: Physician Assistant

## 2012-02-11 ENCOUNTER — Encounter (HOSPITAL_COMMUNITY): Payer: Self-pay | Admitting: *Deleted

## 2012-02-11 ENCOUNTER — Ambulatory Visit (HOSPITAL_COMMUNITY)
Admission: RE | Admit: 2012-02-11 | Discharge: 2012-02-11 | Disposition: A | Discharge status: Home / Self Care | Payer: 59 | Source: Ambulatory Visit | Attending: Internal Medicine | Admitting: Internal Medicine

## 2012-02-11 VITALS — BP 120/85 | HR 95 | Ht 64.0 in | Wt 150.8 lb

## 2012-02-11 DIAGNOSIS — R079 Chest pain, unspecified: Secondary | ICD-10-CM

## 2012-02-11 DIAGNOSIS — R0989 Other specified symptoms and signs involving the circulatory and respiratory systems: Secondary | ICD-10-CM | POA: Insufficient documentation

## 2012-02-11 DIAGNOSIS — R0609 Other forms of dyspnea: Secondary | ICD-10-CM | POA: Insufficient documentation

## 2012-02-11 DIAGNOSIS — I498 Other specified cardiac arrhythmias: Secondary | ICD-10-CM | POA: Insufficient documentation

## 2012-02-11 DIAGNOSIS — Z87891 Personal history of nicotine dependence: Secondary | ICD-10-CM | POA: Insufficient documentation

## 2012-02-11 DIAGNOSIS — Z9889 Other specified postprocedural states: Secondary | ICD-10-CM | POA: Insufficient documentation

## 2012-02-11 DIAGNOSIS — Z888 Allergy status to other drugs, medicaments and biological substances status: Secondary | ICD-10-CM | POA: Insufficient documentation

## 2012-02-11 DIAGNOSIS — R0602 Shortness of breath: Secondary | ICD-10-CM

## 2012-02-11 DIAGNOSIS — Z853 Personal history of malignant neoplasm of breast: Secondary | ICD-10-CM | POA: Insufficient documentation

## 2012-02-11 LAB — CBC
HCT: 42.2 % (ref 36.0–46.0)
Hemoglobin: 14.6 g/dL (ref 12.0–15.0)
MCHC: 34.6 g/dL (ref 30.0–36.0)
WBC: 5.6 10*3/uL (ref 4.0–10.5)

## 2012-02-11 LAB — BASIC METABOLIC PANEL
BUN: 22 mg/dL (ref 6–23)
CO2: 26 mEq/L (ref 19–32)
Chloride: 104 mEq/L (ref 96–112)
GFR calc Af Amer: >90 mL/min (ref >90)
Potassium: 4.5 mEq/L (ref 3.5–5.1)

## 2012-02-11 LAB — PROTIME-INR: INR: 0.98 (ref 0.00–1.49)

## 2012-02-11 MED ORDER — CARVEDILOL 3.125 MG PO TABS: 3.1250 mg | ORAL_TABLET | Freq: Two times a day (BID) | ORAL | Status: DC

## 2012-02-11 NOTE — Assessment & Plan Note (Addendum)
EF on low end of normal. No clinical HF. Will start low-dose carvedilol. Plan RHC at time of coronary angiography.

## 2012-02-11 NOTE — Assessment & Plan Note (Signed)
Continues on a daily basis. Stress test abnormal. Will need cath to further evaluate.

## 2012-02-11 NOTE — Addendum Note (Signed)
Encounter addended by: Noralee Space, RN on: 02/11/2012 11:09 AM<BR>     Documentation filed: Patient Instructions Section

## 2012-02-11 NOTE — Progress Notes (Signed)
Patient ID: Patricia Collier, female   DOB: 03/20/1946, 65 y.o.   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 She received dose-dense doxorubicin and cyclophosphamide x4, then weekly paclitaxel x12. She received 7 weeks of  radiation therapy completed December 2009, she was tried on tamoxifen and letrozole with very poor tolerance. Currently followed Dr Magrinat off treatment. She also has history of SVT noted in 2011. Otherwise she denies any known cardiac history.  She was referred recently by Dr Magrinat for exertional dyspnea. She is reports progressive dyspnea walking up inclined surfaces that is worse over  the last year. Occasional tightness in her chest with exertion after 2-3 flights of stairs. Denies chest tightness at rest. Quit smoking 20 years ago. 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 She has had one episode of SVT rate > 200 (05/2010). Denies lower extremity edema.   ECHO 11/2006 EF 55-60%  ECHO 6/ 2013 EF 45-50% Lateral 12.4 mid distal septal flattening hypokinesis mild AS  Since last visit had stress test on 7/3. Walked 9:46 on [place]. ECG with insignificant upsloping ST depression. + chest tight tightness. Mild reversible defect in anterior and anteroseptal wall (ischemia vs breast attenuation). Continues with daily chest tightness. Worse with exertion but remains active - walking up to 15k steps per day on pedometer but has cut back. Working full-time. Under a lot of stress. No HF symptoms.   PMHx:  1) Breast CA 2) SVT 3) h/o smoking quit 20 years ago      Current Outpatient Prescriptions  Medication Sig Dispense Refill  . phentermine 37.5 MG capsule Take 37.5 mg by mouth as needed.         Allergies    Allergen Reactions  . Hydrocodone-Acetaminophen     REACTION: itching    History   Social History  . Marital Status: Married    Spouse Name: N/A    Number of Children: N/A  . Years of Education: N/A   Occupational History  . Not on file.   Social History Main Topics  . Smoking status: Former Smoker    Types: Cigarettes  . Smokeless tobacco: Not on file   Comment: 12 ys ago.  . Alcohol Use: No  . Drug Use: No  . Sexually Active: Not on file   Other Topics Concern  . Not on file   Social History Narrative  . No narrative on file    Family History  Problem Relation Age of Onset  . Heart disease Mother   . Hypertension Father     PHYSICAL EXAM: Filed Vitals:   02/11/12 1000  BP: 120/85  Pulse: 95   General:  Well appearing. No respiratory difficulty HEENT: normal Neck: supple. no JVD. Carotids 2+ bilat; no bruits. No lymphadenopathy or thryomegaly appreciated. Cor: PMI nondisplaced. Regular rate & rhythm. No rubs, gallops Lungs: clear Abdomen: soft, nontender, nondistended. No hepatosplenomegaly. No bruits or masses. Good bowel sounds. Extremities: no cyanosis, clubbing, rash, edema Neuro: alert & oriented x 3, cranial nerves grossly intact. moves all 4 extremities w/o difficulty. Affect pleasant.  ASSESSMENT & PLAN: 

## 2012-02-11 NOTE — Addendum Note (Signed)
Encounter addended by: Noralee Space, RN on: 02/11/2012 11:20 AM<BR>     Documentation filed: Patient Instructions Section, Orders

## 2012-02-11 NOTE — Patient Instructions (Addendum)
Start Carvedilol 3.125 mg Twice daily   Your physician has requested that you have a cardiac catheterization. Cardiac catheterization is used to diagnose and/or treat various heart conditions. Doctors may recommend this procedure for a number of different reasons. The most common reason is to evaluate chest pain. Chest pain can be a symptom of coronary artery disease (CAD), and cardiac catheterization can show whether plaque is narrowing or blocking your heart's arteries. This procedure is also used to evaluate the valves, as well as measure the blood flow and oxygen levels in different parts of your heart. For further information please visit [email or web address] Please follow instruction sheet, as given.

## 2012-02-15 ENCOUNTER — Encounter (HOSPITAL_BASED_OUTPATIENT_CLINIC_OR_DEPARTMENT_OTHER): Payer: Self-pay | Admitting: Internal Medicine

## 2012-02-15 ENCOUNTER — Encounter (HOSPITAL_BASED_OUTPATIENT_CLINIC_OR_DEPARTMENT_OTHER)
Admission: RE | Disposition: A | Discharge status: Home / Self Care | Payer: Self-pay | Source: Ambulatory Visit | Attending: Internal Medicine

## 2012-02-15 ENCOUNTER — Inpatient Hospital Stay (HOSPITAL_BASED_OUTPATIENT_CLINIC_OR_DEPARTMENT_OTHER)
Admission: RE | Admit: 2012-02-15 | Discharge: 2012-02-15 | Disposition: A | Discharge status: Home / Self Care | Payer: 59 | Source: Ambulatory Visit | Attending: Internal Medicine | Admitting: Internal Medicine

## 2012-02-15 DIAGNOSIS — R0609 Other forms of dyspnea: Secondary | ICD-10-CM | POA: Insufficient documentation

## 2012-02-15 DIAGNOSIS — R0989 Other specified symptoms and signs involving the circulatory and respiratory systems: Secondary | ICD-10-CM | POA: Insufficient documentation

## 2012-02-15 DIAGNOSIS — R079 Chest pain, unspecified: Secondary | ICD-10-CM

## 2012-02-15 DIAGNOSIS — R9439 Abnormal result of other cardiovascular function study: Secondary | ICD-10-CM | POA: Insufficient documentation

## 2012-02-15 DIAGNOSIS — Z853 Personal history of malignant neoplasm of breast: Secondary | ICD-10-CM | POA: Insufficient documentation

## 2012-02-15 LAB — POCT I-STAT 3, ART BLOOD GAS (G3+)
Acid-base deficit: 1 mmol/L (ref 0.0–2.0)
Bicarbonate: 23.7 mEq/L (ref 20.0–24.0)
O2 Saturation: 94 %
pO2, Arterial: 74 mmHg — ABNORMAL LOW (ref 80.0–100.0)

## 2012-02-15 LAB — POCT I-STAT 3, VENOUS BLOOD GAS (G3P V)
Acid-base deficit: 7 mmol/L — ABNORMAL HIGH (ref 0.0–2.0)
O2 Saturation: 70 %
pCO2, Ven: 40.1 mmHg — ABNORMAL LOW (ref 45.0–50.0)

## 2012-02-15 SURGERY — JV LEFT HEART CATHETERIZATION WITH CORONARY ANGIOGRAM
Anesthesia: Moderate Sedation

## 2012-02-15 MED ORDER — SODIUM CHLORIDE 0.9 % IJ SOLN: 3.0000 mL | Freq: Two times a day (BID) | INTRAMUSCULAR | Status: DC

## 2012-02-15 MED ORDER — ACETAMINOPHEN 325 MG PO TABS: 650.0000 mg | ORAL_TABLET | ORAL | Status: DC | PRN

## 2012-02-15 MED ORDER — SODIUM CHLORIDE 0.9 % IJ SOLN: 3.0000 mL | INTRAMUSCULAR | Status: DC | PRN

## 2012-02-15 MED ORDER — SODIUM CHLORIDE 0.9 % IV SOLN: 250.0000 mL | INTRAVENOUS | Status: DC | PRN

## 2012-02-15 MED ORDER — ONDANSETRON HCL 4 MG/2ML IJ SOLN: 4.0000 mg | Freq: Four times a day (QID) | INTRAMUSCULAR | Status: DC | PRN

## 2012-02-15 MED ORDER — ASPIRIN 81 MG PO CHEW
324.0000 mg | CHEWABLE_TABLET | ORAL | Status: AC
  Administered 2012-02-15: 324 mg via ORAL

## 2012-02-15 MED ORDER — SODIUM CHLORIDE 0.9 % IV SOLN: INTRAVENOUS | Status: AC

## 2012-02-15 NOTE — Progress Notes (Signed)
Bedrest begins @ 1430.  Tegaderm dressing applied to right groin site by Towana Badger.  Dr. Gala Romney in to discuss results with patient and husband.

## 2012-02-15 NOTE — Interval H&P Note (Signed)
History and Physical Interval Note:  02/15/2012 1:52 PM  Patricia Collier  has presented today for surgery, with the diagnosis of Chest pain and dyspnea.  The various methods of treatment have been discussed with the patient and family. After consideration of risks, benefits and other options for treatment, the patient has consented to  Procedure(s) (LRB): JV LEFT HEART CATHETERIZATION WITH CORONARY ANGIOGRAM (N/A) as a surgical intervention .  The patient's history has been reviewed, patient examined, no change in status, stable for surgery.  I have reviewed the patient's chart and labs.  Questions were answered to the patient's satisfaction.     Daniel Bensimhon

## 2012-02-15 NOTE — CV Procedure (Signed)
Cardiac Cath Procedure Note  Indication: Abnormal stress test. CP and dyspnea.   Procedures performed:  1) Right heart cathererization 2) Selective coronary angiography 3) Left heart catheterization 4) Left ventriculogram  Description of procedure:     The risks and indication of the procedure were explained. Consent was signed and placed on the chart. An appropriate timeout was taken prior to the procedure. The right groin was prepped and draped in the routine sterile fashion and anesthetized with 1% local lidocaine.   A 5 FR arterial sheath was placed in the right femoral artery using a modified Seldinger technique. Standard catheters including a JL4, JR4 and angled pigtail were used. All catheter exchanges were made over a wire. A 7 FR venous sheath was placed in the right femoral vein using a modified Seldinger technique. A standard Swan-Ganz catheter was used for the procedure.   Complications:  None apparent  Findings:  RA = 4  RV = 25/3/9 PA = 26/12/18 PCW = 9 Fick cardiac output/index = 4.5/2.6 PVR =  2 Woods FA sat = 94% PA sat = 70%  Ao Pressure: 123/72/94 LV Pressure: 120/8/16 There was no signficant gradient across the aortic valve on pullback.  Left main: Short. Normal.   LAD: Normal. Small intramyocardial bridge in the midsection with no flow limitation.   LCX: Minimal plaque distally  RCA: Dominant. Normal.   LV-gram done in the RAO projection: Ejection fraction = 55% Normal wall motion. Small apical diverticulum  Assessment: 1. Normal coronaries 2. Normal LVEF 3. Normal filling pressures  Plan/Discussion:  Normal cath. Doubt CP is cardiac. False + stress test likely related to breast attenuation. Provided reassurance.  Arvilla Meres, MD 1:53 PM

## 2012-02-15 NOTE — OR Nursing (Signed)
Tegaderm dressing applied, site level 0, bedrest begins at 1230 

## 2012-02-15 NOTE — H&P (View-Only) (Signed)
Patient ID: Patricia Collier, female   DOB: Nov 22, 1945, 66 y.o.   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 She received dose-dense doxorubicin and cyclophosphamide x4, then weekly paclitaxel x12. She received 7 weeks of  radiation therapy completed December 2009, she was tried on tamoxifen and letrozole with very poor tolerance. Currently followed Dr Darnelle Catalan off treatment. She also has history of SVT noted in 2011. Otherwise she denies any known cardiac history.  She was referred recently by Dr Darnelle Catalan for exertional dyspnea. She is reports progressive dyspnea walking up inclined surfaces that is worse over  the last year. Occasional tightness in her chest with exertion after 2-3 flights of stairs. Denies chest tightness at rest. Quit smoking 20 years ago. Works full time as Scientist, clinical (histocompatibility and immunogenetics) at [employer]. She has had one episode of SVT rate > 200 (05/2010). Denies lower extremity edema.   ECHO 11/2006 EF 55-60%  ECHO 6/ 2013 EF 45-50% Lateral 12.4 mid distal septal flattening hypokinesis mild AS  Since last visit had stress test on 7/3. Walked 9:46 on [place]. ECG with insignificant upsloping ST depression. + chest tight tightness. Mild reversible defect in anterior and anteroseptal wall (ischemia vs breast attenuation). Continues with daily chest tightness. Worse with exertion but remains active - walking up to 15k steps per day on pedometer but has cut back. Working full-time. Under a lot of stress. No HF symptoms.   PMHx:  1) Breast CA 2) SVT 3) h/o smoking quit 20 years ago      Current Outpatient Prescriptions  Medication Sig Dispense Refill  . phentermine 37.5 MG capsule Take 37.5 mg by mouth as needed.         Allergies    Allergen Reactions  . Hydrocodone-Acetaminophen     REACTION: itching    History   Social History  . Marital Status: Married    Spouse Name: N/A    Number of Children: N/A  . Years of Education: N/A   Occupational History  . Not on file.   Social History Main Topics  . Smoking status: Former Smoker    Types: Cigarettes  . Smokeless tobacco: Not on file   Comment: 12 ys ago.  . Alcohol Use: No  . Drug Use: No  . Sexually Active: Not on file   Other Topics Concern  . Not on file   Social History Narrative  . No narrative on file    Family History  Problem Relation Age of Onset  . Heart disease Mother   . Hypertension Father     PHYSICAL EXAM: Filed Vitals:   02/11/12 1000  BP: 120/85  Pulse: 95   General:  Well appearing. No respiratory difficulty HEENT: normal Neck: supple. no JVD. Carotids 2+ bilat; no bruits. No lymphadenopathy or thryomegaly appreciated. Cor: PMI nondisplaced. Regular rate & rhythm. No rubs, gallops Lungs: clear Abdomen: soft, nontender, nondistended. No hepatosplenomegaly. No bruits or masses. Good bowel sounds. Extremities: no cyanosis, clubbing, rash, edema Neuro: alert & oriented x 3, cranial nerves grossly intact. moves all 4 extremities w/o difficulty. Affect pleasant.  ASSESSMENT & PLAN:

## 2012-05-15 ENCOUNTER — Encounter: Payer: Self-pay | Admitting: Family Medicine

## 2012-05-28 ENCOUNTER — Ambulatory Visit (INDEPENDENT_AMBULATORY_CARE_PROVIDER_SITE_OTHER): Payer: 59 | Admitting: Family Medicine

## 2012-05-28 VITALS — BP 122/72 | Temp 97.8°F | Ht 64.0 in | Wt 152.0 lb

## 2012-05-28 DIAGNOSIS — E785 Hyperlipidemia, unspecified: Secondary | ICD-10-CM

## 2012-05-28 DIAGNOSIS — Z0000 Encounter for general adult medical examination without abnormal findings: Secondary | ICD-10-CM

## 2012-05-28 DIAGNOSIS — C50919 Malignant neoplasm of unspecified site of unspecified female breast: Secondary | ICD-10-CM

## 2012-05-28 DIAGNOSIS — M25559 Pain in unspecified hip: Secondary | ICD-10-CM

## 2012-05-28 DIAGNOSIS — N951 Menopausal and female climacteric states: Secondary | ICD-10-CM

## 2012-05-28 DIAGNOSIS — R9431 Abnormal electrocardiogram [ECG] [EKG]: Secondary | ICD-10-CM | POA: Insufficient documentation

## 2012-05-28 DIAGNOSIS — R232 Flushing: Secondary | ICD-10-CM | POA: Insufficient documentation

## 2012-05-28 NOTE — Progress Notes (Signed)
  Subjective:    Patient ID: Patricia Collier, female    DOB: 05/06/1946, 66 y.o.   MRN: [id number]  HPI New to establish.  Previous MD- Hopp  GYN- [facility]  Breast Cancer- L breast, lumpectomy.  S/p chemo and radiation.  4.5 yrs cancer free.  Following regularly w/ Dr Darnelle Catalan.  Not currently on oral meds.  UTD on mammo.  Abnormal EKG- was referred to cards for stress test.  This was abnormal.  Proceeded to cath and thankfully clear cath.  Has been released by cards.  Not on any cardiac meds.  Denies CP, SOB, HAs, visual changes, edema.  Hyperlipidemia- hx of this but has not had recent labs.  Not currently on meds.  Attempting to control w/ diet and exercise.  Hot flashes- on phentermine per GYN which has improved sxs.  Will take med prn.  Hip pain- sxs started 1 yr ago, had xray w/ Dr Darnelle Catalan and it was normal.  Has appt upcoming on Dec 6th w/ ortho.  Doing aerobics regularly- 'nothing makes it better'.  Pain w/ motion, rest.  Doing acupuncture, Estim, NSAIDs, voltaren gel.  Pain will climb to 9/10.   Review of Systems For ROS see HPI     Objective:   Physical Exam  Vitals reviewed. Constitutional: She is oriented to person, place, and time. She appears well-developed and well-nourished. No distress.  HENT:  Head: Normocephalic and atraumatic.  Eyes: Conjunctivae normal and EOM are normal. Pupils are equal, round, and reactive to light.  Neck: Normal range of motion. Neck supple. No thyromegaly present.  Cardiovascular: Normal rate, regular rhythm, normal heart sounds and intact distal pulses.   No murmur heard. Pulmonary/Chest: Effort normal and breath sounds normal. No respiratory distress.  Abdominal: Soft. She exhibits no distension. There is no tenderness.  Musculoskeletal: She exhibits no edema.  Lymphadenopathy:    She has no cervical adenopathy.  Neurological: She is alert and oriented to person, place, and time.  Skin: Skin is warm and dry.  Psychiatric: She has a  normal mood and affect. Her behavior is normal.          Assessment & Plan:

## 2012-05-28 NOTE — Patient Instructions (Addendum)
Schedule your lab visit at your convenience Keep up the good work!  You look great! Think of [place] as your home base Call with any questions or concerns Welcome!  We're glad to have you! Happy Holidays!!!

## 2012-06-01 ENCOUNTER — Encounter: Payer: Self-pay | Admitting: Family Medicine

## 2012-06-01 NOTE — Assessment & Plan Note (Signed)
New to provider, baseline for pt.  Had complete cardiac w/u- including cath- that was normal.  Asymptomatic, released by cards, not requiring any meds.

## 2012-06-01 NOTE — Assessment & Plan Note (Signed)
New to provider.  Following regularly w/ Dr Darnelle Catalan.  UTD on mammo, colonoscopy.  Not currently on oral meds.

## 2012-06-01 NOTE — Assessment & Plan Note (Signed)
New to provider, chronic for pt.  On phentermine (per GYN) for this w/ good sxs relief.

## 2012-06-01 NOTE — Assessment & Plan Note (Signed)
New to provider, ongoing for pt.  Has tried all conservative home tx w/out relief.  Has upcoming appt w/ ortho.  Will follow and assist as able.

## 2012-06-01 NOTE — Assessment & Plan Note (Signed)
New to provider, chronic for pt.  Not currently on meds, attempting to control w/ diet and exercise.  Will check labs at pt's convenience and start meds prn.

## 2012-06-06 ENCOUNTER — Other Ambulatory Visit (INDEPENDENT_AMBULATORY_CARE_PROVIDER_SITE_OTHER): Payer: 59

## 2012-06-06 DIAGNOSIS — Z0000 Encounter for general adult medical examination without abnormal findings: Secondary | ICD-10-CM

## 2012-06-06 DIAGNOSIS — E785 Hyperlipidemia, unspecified: Secondary | ICD-10-CM

## 2012-06-06 DIAGNOSIS — R232 Flushing: Secondary | ICD-10-CM

## 2012-06-06 DIAGNOSIS — N951 Menopausal and female climacteric states: Secondary | ICD-10-CM

## 2012-06-06 LAB — LIPID PANEL
Cholesterol: 221 mg/dL — ABNORMAL HIGH (ref 0–200)
Total CHOL/HDL Ratio: 4
Triglycerides: 129 mg/dL (ref 0.0–149.0)
VLDL: 25.8 mg/dL (ref 0.0–40.0)

## 2012-06-06 LAB — CBC WITH DIFFERENTIAL/PLATELET
Basophils Relative: 0.8 % (ref 0.0–3.0)
Hemoglobin: 14.4 g/dL (ref 12.0–15.0)
Lymphocytes Relative: 18.6 % (ref 12.0–46.0)
Monocytes Relative: 7.8 % (ref 3.0–12.0)
Neutro Abs: 3.6 10*3/uL (ref 1.4–7.7)
Neutrophils Relative %: 67.8 % (ref 43.0–77.0)
RBC: 4.78 Mil/uL (ref 3.87–5.11)
WBC: 5.3 10*3/uL (ref 4.5–10.5)

## 2012-06-06 LAB — BASIC METABOLIC PANEL
BUN: 21 mg/dL (ref 6–23)
CO2: 27 mEq/L (ref 19–32)
Chloride: 107 mEq/L (ref 96–112)
Creatinine, Ser: 0.8 mg/dL (ref 0.4–1.2)
Potassium: 3.9 mEq/L (ref 3.5–5.1)

## 2012-06-06 LAB — TSH: TSH: 1.92 u[IU]/mL (ref 0.35–5.50)

## 2012-06-06 LAB — HEPATIC FUNCTION PANEL
Albumin: 4 g/dL (ref 3.5–5.2)
Alkaline Phosphatase: 135 U/L — ABNORMAL HIGH (ref 39–117)

## 2012-06-07 ENCOUNTER — Encounter: Payer: Self-pay | Admitting: Family Medicine

## 2012-06-10 LAB — VITAMIN D 1,25 DIHYDROXY
Vitamin D 1, 25 (OH)2 Total: 35 pg/mL (ref 18–72)
Vitamin D2 1, 25 (OH)2: <8 pg/mL
Vitamin D3 1, 25 (OH)2: 35 pg/mL

## 2012-07-04 ENCOUNTER — Encounter: Payer: 59 | Admitting: Family Medicine

## 2012-08-23 ENCOUNTER — Other Ambulatory Visit: Payer: Self-pay

## 2012-10-06 ENCOUNTER — Other Ambulatory Visit: Payer: Self-pay | Admitting: Oncology

## 2012-10-06 DIAGNOSIS — Z9889 Other specified postprocedural states: Secondary | ICD-10-CM

## 2012-10-06 DIAGNOSIS — Z853 Personal history of malignant neoplasm of breast: Secondary | ICD-10-CM

## 2012-10-27 ENCOUNTER — Ambulatory Visit
Admission: RE | Admit: 2012-10-27 | Discharge: 2012-10-27 | Disposition: A | Discharge status: Home / Self Care | Payer: 59 | Source: Ambulatory Visit | Attending: Oncology | Admitting: Oncology

## 2012-10-27 DIAGNOSIS — Z9889 Other specified postprocedural states: Secondary | ICD-10-CM

## 2012-10-27 DIAGNOSIS — Z853 Personal history of malignant neoplasm of breast: Secondary | ICD-10-CM

## 2012-11-06 ENCOUNTER — Other Ambulatory Visit (HOSPITAL_COMMUNITY): Payer: Self-pay | Admitting: Orthopedic Surgery

## 2012-11-06 DIAGNOSIS — M25551 Pain in right hip: Secondary | ICD-10-CM

## 2012-11-10 ENCOUNTER — Other Ambulatory Visit: Payer: Self-pay | Admitting: Physician Assistant

## 2012-11-10 DIAGNOSIS — C50919 Malignant neoplasm of unspecified site of unspecified female breast: Secondary | ICD-10-CM

## 2012-11-11 ENCOUNTER — Other Ambulatory Visit (HOSPITAL_BASED_OUTPATIENT_CLINIC_OR_DEPARTMENT_OTHER): Payer: 59 | Admitting: Lab

## 2012-11-11 ENCOUNTER — Ambulatory Visit (HOSPITAL_COMMUNITY)
Admission: RE | Admit: 2012-11-11 | Discharge: 2012-11-11 | Disposition: A | Discharge status: Home / Self Care | Payer: 59 | Source: Ambulatory Visit | Attending: Orthopedic Surgery | Admitting: Orthopedic Surgery

## 2012-11-11 DIAGNOSIS — M25559 Pain in unspecified hip: Secondary | ICD-10-CM | POA: Insufficient documentation

## 2012-11-11 DIAGNOSIS — C50219 Malignant neoplasm of upper-inner quadrant of unspecified female breast: Secondary | ICD-10-CM

## 2012-11-11 DIAGNOSIS — M76899 Other specified enthesopathies of unspecified lower limb, excluding foot: Secondary | ICD-10-CM | POA: Insufficient documentation

## 2012-11-11 DIAGNOSIS — M25551 Pain in right hip: Secondary | ICD-10-CM

## 2012-11-11 DIAGNOSIS — C50919 Malignant neoplasm of unspecified site of unspecified female breast: Secondary | ICD-10-CM

## 2012-11-11 LAB — CBC WITH DIFFERENTIAL/PLATELET
BASO%: 1.1 % (ref 0.0–2.0)
HCT: 39.7 % (ref 34.8–46.6)
LYMPH%: 21.6 % (ref 14.0–49.7)
MCHC: 34.5 g/dL (ref 31.5–36.0)
MCV: 88.3 fL (ref 79.5–101.0)
MONO#: 0.4 10*3/uL (ref 0.1–0.9)
MONO%: 9.4 % (ref 0.0–14.0)
NEUT%: 62.8 % (ref 38.4–76.8)
Platelets: 190 10*3/uL (ref 145–400)
WBC: 4.7 10*3/uL (ref 3.9–10.3)

## 2012-11-11 LAB — COMPREHENSIVE METABOLIC PANEL (CC13)
CO2: 24 mEq/L (ref 22–29)
Creatinine: 0.8 mg/dL (ref 0.6–1.1)
Glucose: 101 mg/dl — ABNORMAL HIGH (ref 70–99)
Total Bilirubin: 0.72 mg/dL (ref 0.20–1.20)

## 2012-11-18 ENCOUNTER — Ambulatory Visit (HOSPITAL_BASED_OUTPATIENT_CLINIC_OR_DEPARTMENT_OTHER): Payer: 59 | Admitting: Oncology

## 2012-11-18 VITALS — BP 137/83 | HR 91 | Temp 98.2°F | Resp 20 | Ht 64.0 in | Wt 142.5 lb

## 2012-11-18 DIAGNOSIS — C50919 Malignant neoplasm of unspecified site of unspecified female breast: Secondary | ICD-10-CM

## 2012-11-18 DIAGNOSIS — C50912 Malignant neoplasm of unspecified site of left female breast: Secondary | ICD-10-CM

## 2012-11-18 NOTE — Progress Notes (Signed)
ID: Patricia Collier   DOB: [birth year]  MR#: [id number]  WUJ#:[id number]  PCP: Neena Rhymes, MD GYN: Konrad Dolores SU: Cicero Duck OTHER MD: Ollen Gross, Daniel Bensimhon  HISTORY OF PRESENT ILLNESS: She is very regular in performing breast self exams and when she did that in early February 2009, she noted a difference.  It was not very well defined, but nevertheless it was different from before.  She brought this to Dr. Donnetta Hail attention, and he set her up for bilateral diagnostic mammography at [facility] on 09-10-07.  There was a spiculated mass with some faint calcifications in the upper portion of the left breast which on physical examination was palpable.  By ultrasound it was spiculated, solid, taller than wide, and measured up to 11 mm.  The left axilla was fine as was the right breast and axilla.  This was discussed with Dr. 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    With this information, the patient was referred to Dr. Jamey Ripa and bilateral breast MRI's were obtained on 09-19-07.  This basically showed only the solitary abnormality in question (upper inner quadrant of the left breast) measuring 1.4 cm. by MRI.    With this information, after appropriate discussion, the patient proceeded to left lumpectomy with sentinel lymph node biopsy on 10-17-07.  The final pathology [id number]) showed a 1.2 cm. grade 3 invasive ductal carcinoma with no evidence of lymphovascular invasion (although suspicion of lymphovascular invasion had been noted from the biopsy core), with ample margins and the sentinel lymph node negative for tumor spread.  Her subsequent history is as detailed below.  INTERVAL HISTORY: Patricia Collier returns today for followup of her breast cancer. The interval history is unremarkable. She continues to work almost  full-time. 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 We did some plain films last year, but she just had an MRI which thankfully shows no evidence of cancer. She does have some degenerative changes, as well as tendinitis and bursitis. She's following up with also today. Her borderline congestive heart failure is being followed by cardiology, and "almost problems are resolved". She does have nocturia x3 and some urgency at times. She has a little bit of numbness in the ball of her feet and her toes which dates back to her chemotherapy. Hot flashes are occasional. A detailed review of systems today was otherwise stable  PAST MEDICAL HISTORY: 1. Remote history of tobacco use, the patient definitively quitting smoking 17 years ago. 2. Status post cholecystectomy. 3. Status post tonsillectomy and adenoidectomy. 4. Status post surgery to the right eye involving right cataract surgery. 5. Status post septoplasty. 6. Status post "face lift".  7. History of bilateral salpingo-oophorectomy in 2000. 8. History of hysterectomy in 1984. 9. History of laparoscopy for lysis of adhesions in 1991 and 1984.  10. Eczema. 11. Eclampsia with her pregnancy in 1977.  12. Borderline vitamin D deficiency. 13. History of SVT  FAMILY HISTORY The patient's father died at the age of 41 from heart failure.  The patient's mother died at age 65 also with heart problems.  The patient is one of ten siblings, four half-brothers and three half-sisters.  All on the father's side have died, one of those sisters died from breast cancer  about 30 years ago, she was 59 at the time of diagnosis and lived about a year.  The patient's full brother is age 12, he has multiple skin cancers and has required radical neck surgery as a result.  There is no history of breast or ovarian cancer in the family otherwise.   GYNECOLOGIC HISTORY: She is GX P2, first pregnancy age 58.   She took hormone replacement for approximately 15 years, ending on 09-06-07.    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  Her husband of 19 years, Patricia Collier, owns a business that provides fire sprinklers to buildings.  Patricia Collier has two children from a prior marriage - Patricia Collier works with Patricia Collier and he is also a Technical sales engineer; her older daughter, Patricia Collier, lives in [place], is divorced and may be bipolar.  She has a grandchild, Patricia Collier,nine years old, who Patricia Collier is pretty much crazy about.   Maggie attends [employer] currently. 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   ADVANCED DIRECTIVES: in place  HEALTH MAINTENANCE: History  Substance Use Topics  . Smoking status: Former Smoker    Types: Cigarettes  . Smokeless tobacco: Not on file     Comment: 12 ys ago.  . Alcohol Use: No     Colonoscopy: 2013  PAP: Neal  Bone density:  Lipid panel:  Allergies  Allergen Reactions  . Hydrocodone-Acetaminophen     REACTION: itching    Current Outpatient Prescriptions  Medication Sig Dispense Refill  . Diclofenac Sodium (VOLTAREN EX) Apply topically as needed.      . IBUPROFEN PO Take by mouth as needed.      . phentermine 37.5 MG capsule Take 37.5 mg by mouth as needed.       No current facility-administered medications for this visit.    OBJECTIVE: Middle-aged white woman who looks well Filed Vitals:   11/18/12 1035  BP: 137/83  Pulse: 91  Temp: 98.2 F (36.8 C)  Resp: 20     Body mass index is 24.45 kg/(m^2).    ECOG FS: 0  Sclerae unicteric Oropharynx clear No cervical or supraclavicular adenopathy Lungs no rales or rhonchi Heart regular rate and rhythm Abd soft, nontender, positive bowel sounds, no masses palpated MSK no focal spinal tenderness, no peripheral edema Neuro: nonfocal, well oriented, appropriate affect Breasts: Right breast is unremarkable; left breast is status post lumpectomy, no evidence of local recurrence. Left axilla is benign  LAB  RESULTS: Lab Results  Component Value Date   WBC 4.7 11/11/2012   NEUTROABS 3.0 11/11/2012   HGB 13.7 11/11/2012   HCT 39.7 11/11/2012   MCV 88.3 11/11/2012   PLT 190 11/11/2012      Chemistry      Component Value Date/Time   NA 140 11/11/2012 0920   NA 139 06/06/2012 0809   K 4.0 11/11/2012 0920   K 3.9 06/06/2012 0809   CL 107 11/11/2012 0920   CL 107 06/06/2012 0809   CO2 24 11/11/2012 0920   CO2 27 06/06/2012 0809   BUN 18.1 11/11/2012 0920   BUN 21 06/06/2012 0809   CREATININE 0.8 11/11/2012 0920   CREATININE 0.8 06/06/2012 0809      Component Value Date/Time   CALCIUM 9.2 11/11/2012 0920   CALCIUM 9.1 06/06/2012 0809   ALKPHOS 121 11/11/2012 0920   ALKPHOS 135* 06/06/2012 0809   AST 18 11/11/2012 0920   AST 20 06/06/2012 0809   ALT 13 11/11/2012 0920   ALT 17 06/06/2012  0809   BILITOT 0.72 11/11/2012 0920   BILITOT 0.5 06/06/2012 0809       Lab Results  Component Value Date   LABCA2 18 10/19/2010    No components found with this basename: [id number]    No results found for this basename: INR,  in the last 168 hours  Urinalysis    Component Value Date/Time   COLORURINE YELLOW 10/16/2007 0859   APPEARANCEUR CLEAR 10/16/2007 0859   LABSPEC 1.015 01/19/2008 1209   LABSPEC 1.006 10/16/2007 0859   PHURINE 7.0 10/16/2007 0859   GLUCOSEU NEGATIVE 10/16/2007 0859   HGBUR NEGATIVE 10/16/2007 0859   BILIRUBINUR NEGATIVE 10/16/2007 0859   KETONESUR NEGATIVE 10/16/2007 0859   PROTEINUR NEGATIVE 10/16/2007 0859   UROBILINOGEN 0.2 10/16/2007 0859   NITRITE NEGATIVE 10/16/2007 0859   LEUKOCYTESUR NEGATIVE MICROSCOPIC NOT DONE ON URINES WITH NEGATIVE PROTEIN, BLOOD, LEUKOCYTES, NITRITE, OR GLUCOSE <1000 mg/dL. 10/16/2007 0859    STUDIES: Mr Hip Right Wo Contrast  11/11/2012  *RADIOLOGY REPORT*  Clinical Data: Right hip pain.  MRI OF THE RIGHT HIP WITHOUT CONTRAST  Technique:  Multiplanar, multisequence MR imaging was performed. No intravenous contrast was administered.  Comparison: Radiographs 12/12/2011.  Findings:  Both hips are normally located.  There are mild to moderate hip joint degenerative changes bilaterally.  No stress fracture or avascular necrosis.  No hip joint effusion.  No para- articular fluid collections to suggest a paralabral cyst.  There is gluteus medius tendonitis and mild right-sided trochanteric bursitis.  Gluteus medius tendonitis is also noted on the left.  The pubic symphysis and SI joints are intact.  Mild degenerative changes.  No sacral fracture.  The surrounding hip and pelvic musculature appear normal.  No significant intrapelvic abnormalities.  No inguinal mass or hernia.  IMPRESSION:  1.  Mild to moderate hip joint degenerative changes bilaterally. No findings for stress fracture or avascular necrosis. 2.  Gluteus medius tendonitis bilaterally and trochanteric bursitis on the right. 3. Intact bony pelvis. 4.  No significant intrapelvic abnormalities.   Original Report Authenticated By: Rudie Meyer, M.D.    Mm Digital Diagnostic Bilat  10/27/2012  *RADIOLOGY REPORT*  Clinical Data:  The patient underwent left lumpectomy, chemotherapy and radiation therapy for breast cancer in 2009.  DIGITAL DIAGNOSTIC BILATERAL MAMMOGRAM WITH CAD  Comparison: 10/12/2011, 09/27/2010, 09/19/2009, 09/10/2008, 09/10/2007  Findings:  ACR Breast Density Category 2: There are scattered fibroglandular densities.  Left lumpectomy changes are present.  There is no suspicious dominant mass, nonsurgical architectural distortion, or calcification to suggest malignancy.  Mammographic images were processed with CAD.  IMPRESSION: No mammographic evidence of malignancy.  RECOMMENDATION: Yearly diagnostic mammography is suggested.  I have discussed the findings and recommendations with the patient. Results were also provided in writing at the conclusion of the visit.  If applicable, a reminder letter will be sent to the patient regarding her next appointment.  BI-RADS CATEGORY 2:  Benign finding(s).   Original Report  Authenticated By: Cain Saupe, M.D.     ASSESSMENT: 67 y.o.  [place] woman status post left lumpectomy and sentinel lymph node biopsy April 2009 for a T1cN0, grade 3, invasive ductal carcinoma, ER 6% and PR 2% "positive," with an MIB-1 of 18%, no HER-2 amplification.  She received dose-dense doxorubicin and cyclophosphamide x4, then weekly paclitaxel x12.  After radiation therapy completed December 2009, she was tried on tamoxifen and letrozole with very poor tolerance.  She is now followed off treatment.  PLAN: Patricia Collier has done terrific as far as her  breast cancer is concerned. At this point I am comfortable releasing her back to her primary care physician, and she "graduated" today. She understands that we'll be glad to see her again in the future if the need arises, but as of now no further appointments have been made for her here.  Tenesha Garza C    11/18/2012

## 2012-11-20 ENCOUNTER — Other Ambulatory Visit: Payer: Self-pay | Admitting: Oncology

## 2013-04-08 NOTE — Telephone Encounter (Signed)
ERROR

## 2013-05-14 ENCOUNTER — Other Ambulatory Visit: Payer: Self-pay

## 2013-05-23 ENCOUNTER — Ambulatory Visit (INDEPENDENT_AMBULATORY_CARE_PROVIDER_SITE_OTHER): Payer: 59 | Admitting: Family Medicine

## 2013-05-23 ENCOUNTER — Encounter: Payer: Self-pay | Admitting: Family Medicine

## 2013-05-23 VITALS — BP 112/86 | HR 106 | Temp 97.8°F | Ht 64.0 in | Wt 149.0 lb

## 2013-05-23 DIAGNOSIS — J329 Chronic sinusitis, unspecified: Secondary | ICD-10-CM

## 2013-05-23 MED ORDER — ACETAMINOPHEN-CODEINE 120-12 MG/5ML PO SOLN
5.0000 mL | Freq: Four times a day (QID) | ORAL | Status: DC | PRN
Start: 2013-05-23 — End: 2013-11-16

## 2013-05-23 MED ORDER — AZITHROMYCIN 250 MG PO TABS: ORAL_TABLET | ORAL | Status: DC

## 2013-05-23 NOTE — Patient Instructions (Signed)
Mucinex twice a day Probiotic Zinc can help against viruses a bit, consider Xicam, Coldeeze or Calcium/magnesium/Zinc  Sinusitis Sinusitis is redness, soreness, and swelling (inflammation) of the paranasal sinuses. Paranasal sinuses are air pockets within the bones of your face (beneath the eyes, the middle of the forehead, or above the eyes). In healthy paranasal sinuses, mucus is able to drain out, and air is able to circulate through them by way of your nose. However, when your paranasal sinuses are inflamed, mucus and air can become trapped. This can allow bacteria and other germs to grow and cause infection. Sinusitis can develop quickly and last only a short time (acute) or continue over a long period (chronic). Sinusitis that lasts for more than 12 weeks is considered chronic.  CAUSES  Causes of sinusitis include:  Allergies.  Structural abnormalities, such as displacement of the cartilage that separates your nostrils (deviated septum), which can decrease the air flow through your nose and sinuses and affect sinus drainage.  Functional abnormalities, such as when the small hairs (cilia) that line your sinuses and help remove mucus do not work properly or are not present. SYMPTOMS  Symptoms of acute and chronic sinusitis are the same. The primary symptoms are pain and pressure around the affected sinuses. Other symptoms include:  Upper toothache.  Earache.  Headache.  Bad breath.  Decreased sense of smell and taste.  A cough, which worsens when you are lying flat.  Fatigue.  Fever.  Thick drainage from your nose, which often is green and may contain pus (purulent).  Swelling and warmth over the affected sinuses. DIAGNOSIS  Your caregiver will perform a physical exam. During the exam, your caregiver may:  Look in your nose for signs of abnormal growths in your nostrils (nasal polyps).  Tap over the affected sinus to check for signs of infection.  View the inside of  your sinuses (endoscopy) with a special imaging device with a light attached (endoscope), which is inserted into your sinuses. If your caregiver suspects that you have chronic sinusitis, one or more of the following tests may be recommended:  Allergy tests.  Nasal culture A sample of mucus is taken from your nose and sent to a lab and screened for bacteria.  Nasal cytology A sample of mucus is taken from your nose and examined by your caregiver to determine if your sinusitis is related to an allergy. TREATMENT  Most cases of acute sinusitis are related to a viral infection and will resolve on their own within 10 days. Sometimes medicines are prescribed to help relieve symptoms (pain medicine, decongestants, nasal steroid sprays, or saline sprays).  However, for sinusitis related to a bacterial infection, your caregiver will prescribe antibiotic medicines. These are medicines that will help kill the bacteria causing the infection.  Rarely, sinusitis is caused by a fungal infection. In theses cases, your caregiver will prescribe antifungal medicine. For some cases of chronic sinusitis, surgery is needed. Generally, these are cases in which sinusitis recurs more than 3 times per year, despite other treatments. HOME CARE INSTRUCTIONS   Drink plenty of water. Water helps thin the mucus so your sinuses can drain more easily.  Use a humidifier.  Inhale steam 3 to 4 times a day (for example, sit in the bathroom with the shower running).  Apply a warm, moist washcloth to your face 3 to 4 times a day, or as directed by your caregiver.  Use saline nasal sprays to help moisten and clean your sinuses.  Take  over-the-counter or prescription medicines for pain, discomfort, or fever only as directed by your caregiver. SEEK IMMEDIATE MEDICAL CARE IF:  You have increasing pain or severe headaches.  You have nausea, vomiting, or drowsiness.  You have swelling around your face.  You have vision  problems.  You have a stiff neck.  You have difficulty breathing. MAKE SURE YOU:   Understand these instructions.  Will watch your condition.  Will get help right away if you are not doing well or get worse. Document Released: 06/25/2005 Document Revised: 09/17/2011 Document Reviewed: 07/10/2011 [facility] Patient Information 2014 [place], [place].

## 2013-05-23 NOTE — Progress Notes (Signed)
Pre visit review using our clinic review tool, if applicable. No additional management support is needed unless otherwise documented below in the visit note. 

## 2013-05-23 NOTE — Assessment & Plan Note (Signed)
Started on Azithromycin, Mucinex, probiotics, increase rest and fluids

## 2013-05-23 NOTE — Progress Notes (Signed)
Patient ID: Patricia Collier, female   DOB: Apr 20, 1946, 67 y.o.   MRN: [id number] Patricia Collier [id number] March 03, 1946 05/23/2013      Progress Note-Follow Up  Subjective  Chief Complaint  Chief Complaint  Patient presents with  . Facial Pain    sinus pressure X 4 days  . Cough    X 4 days- within the last 24 hours yellow phlegm with cough    HPI  Patient is a 67 year old female in today complaining of 4 to 5 days worth of worsening respiratory symptoms. She is struggling with headache and facial pressure. Has a cough productive of yellow phlegm which is worsening. She has been on file overnight recently and struggles with increased fatigue. Describes some low-grade fevers malaise and myalgias as well. No chest pain or palpitations. No shortness of breath GI or GU complaints.  Past Medical History  Diagnosis Date  . Hyperlipidemia   . Breast cancer     No past surgical history on file.  Family History  Problem Relation Age of Onset  . Heart disease Mother   . Hypertension Father     History   Social History  . Marital Status: Married    Spouse Name: N/A    Number of Children: N/A  . Years of Education: N/A   Occupational History  . Not on file.   Social History Main Topics  . Smoking status: Former Smoker    Types: Cigarettes  . Smokeless tobacco: Not on file     Comment: 12 ys ago.  . Alcohol Use: No  . Drug Use: No  . Sexual Activity: Not on file   Other Topics Concern  . Not on file   Social History Narrative  . No narrative on file    Current Outpatient Prescriptions on File Prior to Visit  Medication Sig Dispense Refill  . IBUPROFEN PO Take by mouth as needed.      . Diclofenac Sodium (VOLTAREN EX) Apply topically as needed.      . phentermine 37.5 MG capsule Take 37.5 mg by mouth as needed.       No current facility-administered medications on file prior to visit.    Allergies  Allergen Reactions  . Hydrocodone-Acetaminophen      REACTION: itching    Review of Systems  Review of Systems  Constitutional: Positive for fever and malaise/fatigue.  HENT: Positive for congestion.   Eyes: Negative for discharge.  Respiratory: Positive for cough and sputum production. Negative for shortness of breath.   Cardiovascular: Negative for chest pain, palpitations and leg swelling.  Gastrointestinal: Negative for nausea, abdominal pain and diarrhea.  Genitourinary: Negative for dysuria.  Musculoskeletal: Negative for falls.  Skin: Negative for rash.  Neurological: Positive for headaches. Negative for loss of consciousness.  Endo/Heme/Allergies: Negative for polydipsia.  Psychiatric/Behavioral: Negative for depression and suicidal ideas. The patient is not nervous/anxious and does not have insomnia.     Objective  BP 112/86  Pulse 106  Temp(Src) 97.8 F (36.6 C) (Oral)  Ht 5\' 4"  (1.626 m)  Wt 149 lb 0.6 oz (67.604 kg)  BMI 25.57 kg/m2  SpO2 97%  Physical Exam  Physical Exam  Constitutional: She is oriented to person, place, and time and well-developed, well-nourished, and in no distress. No distress.  HENT:  Head: Normocephalic and atraumatic.  Eyes: Conjunctivae are normal.  Neck: Neck supple. No thyromegaly present.  Cardiovascular: Normal rate, regular rhythm and normal heart sounds.   No murmur heard. Pulmonary/Chest: Effort  normal and breath sounds normal. She has no wheezes.  Abdominal: She exhibits no distension and no mass.  Musculoskeletal: She exhibits no edema.  Lymphadenopathy:    She has no cervical adenopathy.  Neurological: She is alert and oriented to person, place, and time.  Skin: Skin is warm and dry. No rash noted. She is not diaphoretic.  Psychiatric: Memory, affect and judgment normal.    Lab Results  Component Value Date   TSH 1.92 06/06/2012   Lab Results  Component Value Date   WBC 4.7 11/11/2012   HGB 13.7 11/11/2012   HCT 39.7 11/11/2012   MCV 88.3 11/11/2012   PLT 190 11/11/2012    Lab Results  Component Value Date   CREATININE 0.8 11/11/2012   BUN 18.1 11/11/2012   NA 140 11/11/2012   K 4.0 11/11/2012   CL 107 11/11/2012   CO2 24 11/11/2012   Lab Results  Component Value Date   ALT 13 11/11/2012   AST 18 11/11/2012   ALKPHOS 121 11/11/2012   BILITOT 0.72 11/11/2012   Lab Results  Component Value Date   CHOL 221* 06/06/2012   Lab Results  Component Value Date   HDL 53.60 06/06/2012   No results found for this basename: [facility]   Lab Results  Component Value Date   TRIG 129.0 06/06/2012   Lab Results  Component Value Date   CHOLHDL 4 06/06/2012     Assessment & Plan   Sinusitis Started on Azithromycin, Mucinex, probiotics, increase rest and fluids

## 2013-07-09 HISTORY — PX: CARDIAC CATHETERIZATION: SHX172

## 2013-10-06 ENCOUNTER — Other Ambulatory Visit: Payer: Self-pay | Admitting: Obstetrics & Gynecology

## 2013-10-06 DIAGNOSIS — Z9889 Other specified postprocedural states: Secondary | ICD-10-CM

## 2013-10-06 DIAGNOSIS — Z853 Personal history of malignant neoplasm of breast: Secondary | ICD-10-CM

## 2013-11-06 ENCOUNTER — Telehealth: Payer: Self-pay | Admitting: Family Medicine

## 2013-11-06 NOTE — Telephone Encounter (Signed)
A user error has taken place.

## 2013-11-11 ENCOUNTER — Encounter: Payer: Self-pay | Admitting: Family Medicine

## 2013-11-13 ENCOUNTER — Ambulatory Visit
Admission: RE | Admit: 2013-11-13 | Discharge: 2013-11-13 | Disposition: A | Discharge status: Home / Self Care | Payer: 59 | Source: Ambulatory Visit | Attending: Obstetrics & Gynecology | Admitting: Obstetrics & Gynecology

## 2013-11-13 DIAGNOSIS — Z853 Personal history of malignant neoplasm of breast: Secondary | ICD-10-CM

## 2013-11-13 DIAGNOSIS — Z9889 Other specified postprocedural states: Secondary | ICD-10-CM

## 2013-11-16 ENCOUNTER — Encounter: Payer: Self-pay | Admitting: Family Medicine

## 2013-11-16 ENCOUNTER — Ambulatory Visit (INDEPENDENT_AMBULATORY_CARE_PROVIDER_SITE_OTHER): Payer: 59 | Admitting: Family Medicine

## 2013-11-16 ENCOUNTER — Telehealth: Payer: Self-pay | Admitting: Family Medicine

## 2013-11-16 VITALS — BP 102/72 | HR 94 | Temp 98.4°F | Resp 16 | Wt 147.2 lb

## 2013-11-16 DIAGNOSIS — R400 Somnolence: Secondary | ICD-10-CM | POA: Insufficient documentation

## 2013-11-16 DIAGNOSIS — E785 Hyperlipidemia, unspecified: Secondary | ICD-10-CM

## 2013-11-16 DIAGNOSIS — R404 Transient alteration of awareness: Secondary | ICD-10-CM

## 2013-11-16 DIAGNOSIS — M25562 Pain in left knee: Secondary | ICD-10-CM

## 2013-11-16 DIAGNOSIS — R1013 Epigastric pain: Secondary | ICD-10-CM | POA: Insufficient documentation

## 2013-11-16 DIAGNOSIS — M25569 Pain in unspecified knee: Secondary | ICD-10-CM

## 2013-11-16 LAB — LIPID PANEL
CHOL/HDL RATIO: 4
Cholesterol: 246 mg/dL — ABNORMAL HIGH (ref 0–200)
HDL: 60.6 mg/dL (ref >39.00)
LDL Cholesterol: 175 mg/dL — ABNORMAL HIGH (ref 0–99)
Triglycerides: 51 mg/dL (ref 0.0–149.0)
VLDL: 10.2 mg/dL (ref 0.0–40.0)

## 2013-11-16 LAB — CBC WITH DIFFERENTIAL/PLATELET
BASOS ABS: 0 10*3/uL (ref 0.0–0.1)
BASOS PCT: 0.6 % (ref 0.0–3.0)
EOS ABS: 0.1 10*3/uL (ref 0.0–0.7)
Eosinophils Relative: 2.6 % (ref 0.0–5.0)
HCT: 41.9 % (ref 36.0–46.0)
Hemoglobin: 14.3 g/dL (ref 12.0–15.0)
LYMPHS PCT: 16.6 % (ref 12.0–46.0)
Lymphs Abs: 0.9 10*3/uL (ref 0.7–4.0)
MCHC: 34 g/dL (ref 30.0–36.0)
MCV: 89.9 fl (ref 78.0–100.0)
MONO ABS: 0.4 10*3/uL (ref 0.1–1.0)
Monocytes Relative: 7 % (ref 3.0–12.0)
NEUTROS PCT: 73.2 % (ref 43.0–77.0)
Neutro Abs: 3.8 10*3/uL (ref 1.4–7.7)
Platelets: 225 10*3/uL (ref 150.0–400.0)
RBC: 4.66 Mil/uL (ref 3.87–5.11)
RDW: 13.9 % (ref 11.5–15.5)
WBC: 5.2 10*3/uL (ref 4.0–10.5)

## 2013-11-16 LAB — BASIC METABOLIC PANEL
BUN: 20 mg/dL (ref 6–23)
CHLORIDE: 105 meq/L (ref 96–112)
CO2: 26 meq/L (ref 19–32)
Calcium: 9.5 mg/dL (ref 8.4–10.5)
Creatinine, Ser: 0.6 mg/dL (ref 0.4–1.2)
GFR: 98.86 mL/min (ref >60.00)
GLUCOSE: 86 mg/dL (ref 70–99)
POTASSIUM: 3.7 meq/L (ref 3.5–5.1)
SODIUM: 138 meq/L (ref 135–145)

## 2013-11-16 LAB — HEPATIC FUNCTION PANEL
ALK PHOS: 117 U/L (ref 39–117)
ALT: 16 U/L (ref 0–35)
AST: 20 U/L (ref 0–37)
Albumin: 4.3 g/dL (ref 3.5–5.2)
BILIRUBIN DIRECT: 0.1 mg/dL (ref 0.0–0.3)
BILIRUBIN TOTAL: 1 mg/dL (ref 0.2–1.2)
TOTAL PROTEIN: 6.9 g/dL (ref 6.0–8.3)

## 2013-11-16 LAB — TSH: TSH: 2.43 u[IU]/mL (ref 0.35–4.50)

## 2013-11-16 LAB — AMYLASE: AMYLASE: 42 U/L (ref 27–131)

## 2013-11-16 LAB — H. PYLORI ANTIBODY, IGG: H Pylori IgG: NEGATIVE

## 2013-11-16 LAB — LIPASE: LIPASE: 17 U/L (ref 11.0–59.0)

## 2013-11-16 MED ORDER — MELOXICAM 15 MG PO TABS: 15.0000 mg | ORAL_TABLET | Freq: Every day | ORAL | Status: DC

## 2013-11-16 MED ORDER — OMEPRAZOLE 20 MG PO CPDR: 20.0000 mg | DELAYED_RELEASE_CAPSULE | Freq: Every day | ORAL | Status: DC

## 2013-11-16 NOTE — Patient Instructions (Signed)
Please schedule your complete physical in 6 months We'll notify you of your lab results and make any changes if needed Start the Meloxicam daily for inflammation- you can add tylenol as needed for pain but no other anti-inflammatories We'll call you with your ortho and sleep appts ICE your knee as needed Start the Omeprazole daily for your abdominal discomfort and bloating Call with any questions or concerns Hang in there!

## 2013-11-16 NOTE — Progress Notes (Signed)
Pre visit review using our clinic review tool, if applicable. No additional management support is needed unless otherwise documented below in the visit note. 

## 2013-11-16 NOTE — Assessment & Plan Note (Signed)
Chronic problem.  Not currently on meds.  Check labs.  Start meds prn.

## 2013-11-16 NOTE — Progress Notes (Signed)
   Subjective:    Patient ID: Patricia Collier, female    DOB: 05/15/1946, 68 y.o.   MRN: [id number]  HPI Leg pain- no known injury, no swelling.  1 week ago had 'spontaneous bruising' behind L knee.  Pain is intermittent.  No particular trigger.  Pain is localized to posterior and lateral L knee.  Unable to kneel on knee when it is painful.  Has seen Al[facility]io previously for the hip.  Excessive sleepiness- pt reports that she is able to fall asleep 'at the drop of a hat', particularly when driving.  This forces her to pull over on the side of the road and nap- she must do this 4 times between here and the beach.  Reports sleeping well at night.  Denies excessive snoring.  Epigastric discomfort- intermittent bloating, no GERD.  Worse after eating.  No N/V.  S/p oopherectomy, 'i'm not worried about that'.   Review of Systems For ROS see HPI     Objective:   Physical Exam  Vitals reviewed. Constitutional: She is oriented to person, place, and time. She appears well-developed and well-nourished. No distress.  Cardiovascular: Normal rate, regular rhythm, normal heart sounds and intact distal pulses.   Pulmonary/Chest: Effort normal and breath sounds normal. No respiratory distress. She has no wheezes. She has no rales.  Abdominal: Soft. Bowel sounds are normal. She exhibits no distension. There is no tenderness. There is no rebound and no guarding.  Musculoskeletal: She exhibits tenderness (over medial and lateral joint lines of L knee). She exhibits no edema.  + crepitus w/ flexion/extension of L knee  Neurological: She is alert and oriented to person, place, and time.  Skin: Skin is warm and dry. No erythema.  Psychiatric: She has a normal mood and affect. Her behavior is normal.          Assessment & Plan:

## 2013-11-16 NOTE — Assessment & Plan Note (Signed)
New.  Check labs to r/o pancreatitis, biliary dysfunction, infxn, H pylori.  Start PPI.  Monitor for improvement.

## 2013-11-16 NOTE — Assessment & Plan Note (Signed)
New.  Pt w/ bilateral joint line tenderness.  Refer to Ortho.  Start daily NSAID.  Pt expressed understanding and is in agreement w/ plan.

## 2013-11-16 NOTE — Assessment & Plan Note (Signed)
New.  Given the fact that pt is falling asleep rapidly and at inopportune times, will refer to sleep medicine for evaluation of possible narcolepsy.  Will follow.

## 2013-11-16 NOTE — Telephone Encounter (Signed)
Called pt to make her aware of her specialist appointments. Patient requesting that we cancel the prescriptions we sent to [facility] and send them to [facility].

## 2013-11-16 NOTE — Telephone Encounter (Signed)
Med changed to [facility].

## 2013-11-17 ENCOUNTER — Other Ambulatory Visit: Payer: Self-pay | Admitting: General Practice

## 2013-11-17 MED ORDER — ATORVASTATIN CALCIUM 20 MG PO TABS: 20.0000 mg | ORAL_TABLET | Freq: Every day | ORAL | Status: DC

## 2013-11-20 ENCOUNTER — Ambulatory Visit (INDEPENDENT_AMBULATORY_CARE_PROVIDER_SITE_OTHER): Payer: 59 | Admitting: Pulmonary Disease

## 2013-11-20 ENCOUNTER — Encounter: Payer: Self-pay | Admitting: Pulmonary Disease

## 2013-11-20 VITALS — BP 118/76 | HR 86 | Temp 98.1°F | Ht 63.0 in | Wt 151.6 lb

## 2013-11-20 DIAGNOSIS — F518 Other sleep disorders not due to a substance or known physiological condition: Secondary | ICD-10-CM

## 2013-11-20 DIAGNOSIS — Z72821 Inadequate sleep hygiene: Secondary | ICD-10-CM | POA: Insufficient documentation

## 2013-11-20 DIAGNOSIS — G4726 Circadian rhythm sleep disorder, shift work type: Secondary | ICD-10-CM

## 2013-11-20 NOTE — Patient Instructions (Signed)
Try and commit to good sleep by going to bed when you get home from work.  Turn off all electronics, room darkening shades, eye mask, ear plugs, cool room, etc. If you continue to have issues despite making effort to sleep during your off hours, let me know and we can discuss a trial of nuvigil for shift workers sleep disorder, or possibly consider evaluate for sleep apnea.

## 2013-11-20 NOTE — Assessment & Plan Note (Signed)
The patient works the night shift, and feels that she has difficulty sleeping during the day. However, she admits that she has not made a very good effort to maximize her sleep time during the day. I have discussed with her the role of room darkening shades, a sleep mask, ear plugs, and turning off all electronic devices. I am willing to give her a trial of nuvigil to try and improve her safety, but only after she has made a significant effort at getting sleep during the day when she gets off from work. I have also reminded her of her more responsibility to not drive if she is sleepy.

## 2013-11-20 NOTE — Assessment & Plan Note (Signed)
The patient has very poor sleep hygiene, and basically is not getting enough sleep. She takes responsibility for this, and states that she will make a better effort at getting some sleep during the day after work. If she continues to have sleepiness despite doing this, would also consider whether she may have another sleep disorder such as obstructive sleep apnea.

## 2013-11-20 NOTE — Progress Notes (Signed)
Subjective:    Patient ID: Patricia Collier, female    DOB: January 22, 1946, 68 y.o.   MRN: [id number]  HPI The patient is a 68 year old female who I've been asked to see for hypersomnia. The patient states that she has had increased sleepiness over the last one year, and this results in significant fatigue and even sleepiness with driving. The patient typically works the night shift either 3 PM to 7 AM or 11 PM to 7 AM for many years. She will frequently get about 2-3 hours of sleep each night during her shift, but does not sleep during the day when she gets home. She feels that she is unable to sleep during the day, and will do her errands and also play golf.  She admits that she really doesn't even try to sleep during the day, but she could only get 2-3 hours if she tries. She admits that her sleepiness is much improved whenever she makes more of a conscious effort to get sleep during the day. On the other hand, she feels she isn't rested even when she gets a normal quantity of sleep on a normal schedule. However, I have explained to her the concept of chronic sleep depravation and sleep debt.  She has been told that she snores, but no one has commented on an abnormal breathing pattern during sleep. She states that her weight is up about 15 pounds over the last few years, and her Epworth score today is 17.   Sleep Questionnaire What time do you typically go to bed?( Between what hours) 2 am 2 am at 1151 on 11/20/13 by Lilli Few, CMA How long does it take you to fall asleep? 5 minutes 5 minutes at 1151 on 11/20/13 by Lilli Few, CMA How many times during the night do you wake up? 6 6 at 1151 on 11/20/13 by Lilli Few, CMA What time do you get out of bed to start your day? 0600 0600 at 1151 on 11/20/13 by Lilli Few, CMA Do you drive or operate heavy machinery in your occupation? No No at 1151 on 11/20/13 by Lilli Few, CMA How much has your  weight changed (up or down) over the past two years? (In pounds) 15 lb (6.804 kg) 15 lb (6.804 kg) at 1151 on 11/20/13 by Lilli Few, CMA Have you ever had a sleep study before? No No at 1151 on 11/20/13 by Lilli Few, CMA Do you currently use CPAP? No No at 1151 on 11/20/13 by Lilli Few, CMA Do you wear oxygen at any time? No No at 1151 on 11/20/13 by Lilli Few, CMA   Review of Systems  Constitutional: Negative for fever and unexpected weight change.  HENT: Negative for congestion, dental problem, ear pain, nosebleeds, postnasal drip, rhinorrhea, sinus pressure, sneezing, sore throat and trouble swallowing.   Eyes: Negative for redness and itching.  Respiratory: Negative for cough, chest tightness, shortness of breath and wheezing.   Cardiovascular: Positive for palpitations. Negative for leg swelling.  Gastrointestinal: Negative for nausea and vomiting.  Genitourinary: Negative for dysuria.  Musculoskeletal: Negative for joint swelling.  Skin: Negative for rash.  Neurological: Negative for headaches.  Hematological: Does not bruise/bleed easily.  Psychiatric/Behavioral: Negative for dysphoric mood. The patient is not nervous/anxious.        Objective:   Physical Exam Constitutional:  Well developed, no acute distress  HENT:  Nares patent without discharge  Oropharynx without exudate, palate and uvula  are normal  Eyes:  Perrla, eomi, no scleral icterus  Neck:  No JVD, no TMG  Cardiovascular:  Normal rate, regular rhythm, no rubs or gallops.  No murmurs        Intact distal pulses  Pulmonary :  Normal breath sounds, no stridor or respiratory distress   No rales, rhonchi, or wheezing  Abdominal:  Soft, nondistended, bowel sounds present.  No tenderness noted.   Musculoskeletal:  No lower extremity edema noted.  Lymph Nodes:  No cervical lymphadenopathy noted  Skin:  No cyanosis noted  Neurologic:  Mildly sleepy but  appropriate, moves all 4 extremities without obvious deficit.         Assessment & Plan:

## 2014-02-18 ENCOUNTER — Other Ambulatory Visit: Payer: Self-pay | Admitting: General Practice

## 2014-02-18 DIAGNOSIS — M25562 Pain in left knee: Secondary | ICD-10-CM

## 2014-02-18 MED ORDER — MELOXICAM 15 MG PO TABS: 15.0000 mg | ORAL_TABLET | Freq: Every day | ORAL | Status: DC

## 2014-04-13 ENCOUNTER — Other Ambulatory Visit: Payer: Self-pay | Admitting: Dermatology

## 2014-04-15 ENCOUNTER — Other Ambulatory Visit: Payer: Self-pay | Admitting: Obstetrics & Gynecology

## 2014-04-16 LAB — CYTOLOGY - PAP

## 2014-04-23 ENCOUNTER — Other Ambulatory Visit: Payer: Self-pay

## 2014-04-29 ENCOUNTER — Other Ambulatory Visit (HOSPITAL_COMMUNITY): Payer: Self-pay | Admitting: Orthopedic Surgery

## 2014-04-29 DIAGNOSIS — M25562 Pain in left knee: Secondary | ICD-10-CM

## 2014-05-06 ENCOUNTER — Ambulatory Visit (HOSPITAL_COMMUNITY)
Admission: RE | Admit: 2014-05-06 | Discharge: 2014-05-06 | Disposition: A | Discharge status: Home / Self Care | Payer: 59 | Source: Ambulatory Visit | Attending: Orthopedic Surgery | Admitting: Orthopedic Surgery

## 2014-05-06 DIAGNOSIS — M25562 Pain in left knee: Secondary | ICD-10-CM

## 2014-05-06 DIAGNOSIS — Z853 Personal history of malignant neoplasm of breast: Secondary | ICD-10-CM | POA: Insufficient documentation

## 2014-05-06 DIAGNOSIS — M179 Osteoarthritis of knee, unspecified: Secondary | ICD-10-CM | POA: Diagnosis not present

## 2014-05-06 DIAGNOSIS — M7122 Synovial cyst of popliteal space [Baker], left knee: Secondary | ICD-10-CM | POA: Diagnosis not present

## 2014-05-19 ENCOUNTER — Encounter: Payer: 59 | Admitting: Family Medicine

## 2014-06-20 ENCOUNTER — Encounter: Payer: Self-pay | Admitting: Family Medicine

## 2014-06-21 ENCOUNTER — Ambulatory Visit (INDEPENDENT_AMBULATORY_CARE_PROVIDER_SITE_OTHER): Payer: 59 | Admitting: Family Medicine

## 2014-06-21 ENCOUNTER — Ambulatory Visit (HOSPITAL_BASED_OUTPATIENT_CLINIC_OR_DEPARTMENT_OTHER)
Admission: RE | Admit: 2014-06-21 | Discharge: 2014-06-21 | Disposition: A | Discharge status: Home / Self Care | Payer: 59 | Source: Ambulatory Visit | Attending: Family Medicine | Admitting: Family Medicine

## 2014-06-21 ENCOUNTER — Encounter: Payer: Self-pay | Admitting: Family Medicine

## 2014-06-21 VITALS — BP 120/80 | HR 72 | Temp 97.9°F | Resp 16 | Wt 148.5 lb

## 2014-06-21 DIAGNOSIS — N281 Cyst of kidney, acquired: Secondary | ICD-10-CM | POA: Diagnosis not present

## 2014-06-21 DIAGNOSIS — R319 Hematuria, unspecified: Secondary | ICD-10-CM

## 2014-06-21 DIAGNOSIS — Z853 Personal history of malignant neoplasm of breast: Secondary | ICD-10-CM | POA: Insufficient documentation

## 2014-06-21 DIAGNOSIS — I878 Other specified disorders of veins: Secondary | ICD-10-CM | POA: Insufficient documentation

## 2014-06-21 DIAGNOSIS — N3941 Urge incontinence: Secondary | ICD-10-CM | POA: Diagnosis present

## 2014-06-21 DIAGNOSIS — R310 Gross hematuria: Secondary | ICD-10-CM | POA: Diagnosis present

## 2014-06-21 DIAGNOSIS — R109 Unspecified abdominal pain: Secondary | ICD-10-CM | POA: Insufficient documentation

## 2014-06-21 DIAGNOSIS — Z90710 Acquired absence of both cervix and uterus: Secondary | ICD-10-CM | POA: Insufficient documentation

## 2014-06-21 DIAGNOSIS — N390 Urinary tract infection, site not specified: Secondary | ICD-10-CM

## 2014-06-21 DIAGNOSIS — R10A1 Flank pain, right side: Secondary | ICD-10-CM

## 2014-06-21 DIAGNOSIS — Z9049 Acquired absence of other specified parts of digestive tract: Secondary | ICD-10-CM | POA: Insufficient documentation

## 2014-06-21 DIAGNOSIS — R82998 Other abnormal findings in urine: Secondary | ICD-10-CM

## 2014-06-21 LAB — POCT URINALYSIS DIPSTICK
Bilirubin, UA: NEGATIVE
Nitrite, UA: POSITIVE
SPEC GRAV UA: 1.01
Urobilinogen, UA: 2
pH, UA: 7

## 2014-06-21 MED ORDER — CIPROFLOXACIN HCL 500 MG PO TABS: 500.0000 mg | ORAL_TABLET | Freq: Two times a day (BID) | ORAL | Status: DC

## 2014-06-21 NOTE — Progress Notes (Signed)
Pre visit review using our clinic review tool, if applicable. No additional management support is needed unless otherwise documented below in the visit note. 

## 2014-06-21 NOTE — Progress Notes (Signed)
   Subjective:    Patient ID: Patricia Collier, female    DOB: 1945/09/21, 68 y.o.   MRN: [id number]  HPI Hematuria- pt has hx of similar.  Pt had 'a lot of suprapubic pressure right before Thanksgiving'.  Pt has seen Dr Risa Grill previously for 2 similar episodes.  Had cysto and CT scan.  Pt reports she will have bleeding for ~2 days, will resolve for a few days and then it will return- 3 episodes since Thanksgiving.  Now having R flank pain.  No fever.  + urgency and frequency.   Review of Systems For ROS see HPI     Objective:   Physical Exam  Constitutional: She is oriented to person, place, and time. She appears well-developed and well-nourished. No distress.  HENT:  Head: Normocephalic and atraumatic.  Abdominal: Soft. Bowel sounds are normal. She exhibits no distension. Tenderness:  R CVA and flank pain, no suprapubic pain. There is no rebound and no guarding.  Musculoskeletal: She exhibits no edema.  Neurological: She is alert and oriented to person, place, and time.  Skin: Skin is warm and dry.  Psychiatric: She has a normal mood and affect. Her behavior is normal. Thought content normal.  Vitals reviewed.         Assessment & Plan:

## 2014-06-21 NOTE — Patient Instructions (Signed)
We'll notify you of your CT results and make any changes if needed Start the Cipro Follow up w/ Dr Risa Grill Drink plenty of fluids Call with any questions or concerns HAPPY BIRTHDAY!!! Happy Holidays!!!

## 2014-06-21 NOTE — Telephone Encounter (Signed)
Pt was scheduled for 4pm today.

## 2014-06-22 NOTE — Assessment & Plan Note (Signed)
New to provider.  Recurrent for pt.  Has seen uro for this previously w/o obvious source of bleeding.  Pt's UA is highly suspicious for infxn and given CVA tenderness and flank pain will tx w/ Cipro and send urine for culture.  Pt to have urgent f/u w/ Urology due to gross blood and multiple recurrences.

## 2014-06-22 NOTE — Assessment & Plan Note (Signed)
New.  Due to gross hematuria and pain, will get CT to r/o stone.  Pt expressed understanding and is in agreement w/ plan.

## 2014-06-26 ENCOUNTER — Other Ambulatory Visit: Payer: Self-pay | Admitting: Family Medicine

## 2014-06-26 LAB — URINE CULTURE

## 2014-06-26 MED ORDER — NITROFURANTOIN MONOHYD MACRO 100 MG PO CAPS: 100.0000 mg | ORAL_CAPSULE | Freq: Two times a day (BID) | ORAL | Status: DC

## 2014-09-07 ENCOUNTER — Other Ambulatory Visit (HOSPITAL_COMMUNITY): Payer: Self-pay | Admitting: Urology

## 2014-09-07 DIAGNOSIS — R310 Gross hematuria: Secondary | ICD-10-CM

## 2014-09-15 ENCOUNTER — Ambulatory Visit (HOSPITAL_COMMUNITY)
Admission: RE | Admit: 2014-09-15 | Discharge: 2014-09-15 | Disposition: A | Discharge status: Home / Self Care | Payer: 59 | Source: Ambulatory Visit | Attending: Urology | Admitting: Urology

## 2014-09-15 ENCOUNTER — Encounter (HOSPITAL_COMMUNITY): Payer: Self-pay

## 2014-09-15 DIAGNOSIS — R310 Gross hematuria: Secondary | ICD-10-CM | POA: Insufficient documentation

## 2014-09-15 MED ORDER — IOHEXOL 300 MG/ML  SOLN
125.0000 mL | Freq: Once | INTRAMUSCULAR | Status: AC | PRN
  Administered 2014-09-15: 125 mL via INTRAVENOUS

## 2014-09-16 ENCOUNTER — Other Ambulatory Visit (HOSPITAL_COMMUNITY): Payer: Self-pay | Admitting: Urology

## 2014-09-16 DIAGNOSIS — K8689 Other specified diseases of pancreas: Secondary | ICD-10-CM

## 2014-09-29 ENCOUNTER — Ambulatory Visit (HOSPITAL_COMMUNITY)
Admission: RE | Admit: 2014-09-29 | Discharge: 2014-09-29 | Disposition: A | Discharge status: Home / Self Care | Payer: 59 | Source: Ambulatory Visit | Attending: Urology | Admitting: Urology

## 2014-09-29 DIAGNOSIS — R310 Gross hematuria: Secondary | ICD-10-CM | POA: Insufficient documentation

## 2014-09-29 DIAGNOSIS — Z853 Personal history of malignant neoplasm of breast: Secondary | ICD-10-CM | POA: Insufficient documentation

## 2014-09-29 DIAGNOSIS — K8689 Other specified diseases of pancreas: Secondary | ICD-10-CM

## 2014-09-29 DIAGNOSIS — K869 Disease of pancreas, unspecified: Secondary | ICD-10-CM | POA: Insufficient documentation

## 2014-09-29 DIAGNOSIS — R1013 Epigastric pain: Secondary | ICD-10-CM | POA: Diagnosis not present

## 2014-09-29 MED ORDER — GADOBENATE DIMEGLUMINE 529 MG/ML IV SOLN
14.0000 mL | Freq: Once | INTRAVENOUS | Status: AC | PRN
  Administered 2014-09-29: 14 mL via INTRAVENOUS

## 2014-10-18 ENCOUNTER — Other Ambulatory Visit: Payer: Self-pay | Admitting: Oncology

## 2014-10-18 DIAGNOSIS — D595 Paroxysmal nocturnal hemoglobinuria [Marchiafava-Micheli]: Secondary | ICD-10-CM

## 2014-10-19 ENCOUNTER — Telehealth: Payer: Self-pay | Admitting: Oncology

## 2014-10-19 NOTE — Telephone Encounter (Signed)
Called and left a message with 4/19 lab and 5/6 md visit

## 2014-10-26 ENCOUNTER — Other Ambulatory Visit: Payer: Self-pay | Admitting: *Deleted

## 2014-10-26 ENCOUNTER — Other Ambulatory Visit (HOSPITAL_BASED_OUTPATIENT_CLINIC_OR_DEPARTMENT_OTHER): Payer: 59

## 2014-10-26 DIAGNOSIS — R310 Gross hematuria: Secondary | ICD-10-CM

## 2014-10-26 DIAGNOSIS — D595 Paroxysmal nocturnal hemoglobinuria [Marchiafava-Micheli]: Secondary | ICD-10-CM | POA: Diagnosis not present

## 2014-10-26 DIAGNOSIS — G4726 Circadian rhythm sleep disorder, shift work type: Secondary | ICD-10-CM

## 2014-10-26 DIAGNOSIS — R0609 Other forms of dyspnea: Principal | ICD-10-CM

## 2014-10-26 DIAGNOSIS — R400 Somnolence: Secondary | ICD-10-CM

## 2014-10-26 LAB — CBC & DIFF AND RETIC
BASO%: 0.3 % (ref 0.0–2.0)
Basophils Absolute: 0 10*3/uL (ref 0.0–0.1)
EOS ABS: 0.3 10*3/uL (ref 0.0–0.5)
EOS%: 4.7 % (ref 0.0–7.0)
HCT: 40.3 % (ref 34.8–46.6)
HGB: 13.5 g/dL (ref 11.6–15.9)
Immature Retic Fract: 6 % (ref 1.60–10.00)
LYMPH%: 14.1 % (ref 14.0–49.7)
MCH: 30.4 pg (ref 25.1–34.0)
MCHC: 33.5 g/dL (ref 31.5–36.0)
MCV: 90.8 fL (ref 79.5–101.0)
MONO#: 0.5 10*3/uL (ref 0.1–0.9)
MONO%: 9.2 % (ref 0.0–14.0)
NEUT%: 71.7 % (ref 38.4–76.8)
NEUTROS ABS: 4.2 10*3/uL (ref 1.5–6.5)
PLATELETS: 200 10*3/uL (ref 145–400)
RBC: 4.44 10*6/uL (ref 3.70–5.45)
RDW: 13.2 % (ref 11.2–14.5)
RETIC CT ABS: 57.28 10*3/uL (ref 33.70–90.70)
Retic %: 1.29 % (ref 0.70–2.10)
WBC: 5.9 10*3/uL (ref 3.9–10.3)
lymph#: 0.8 10*3/uL — ABNORMAL LOW (ref 0.9–3.3)

## 2014-10-26 LAB — CHCC SMEAR

## 2014-10-26 LAB — LACTATE DEHYDROGENASE (CC13): LDH: 630 U/L — AB (ref 125–245)

## 2014-10-27 LAB — HEPATIC FUNCTION PANEL
ALK PHOS: 131 U/L — AB (ref 39–117)
ALT: 26 U/L (ref 0–35)
AST: 31 U/L (ref 0–37)
Albumin: 4.2 g/dL (ref 3.5–5.2)
Bilirubin, Direct: 0.1 mg/dL (ref 0.0–0.3)
Indirect Bilirubin: 0.4 mg/dL (ref 0.2–1.2)
Total Bilirubin: 0.5 mg/dL (ref 0.2–1.2)
Total Protein: 6.2 g/dL (ref 6.0–8.3)

## 2014-10-27 LAB — DIRECT ANTIGLOBULIN RFX ANTI-C3/IGG: DAT, Polyspecific: NEGATIVE

## 2014-10-27 LAB — HAPTOGLOBIN: Haptoglobin: <15 mg/dL — ABNORMAL LOW (ref 43–212)

## 2014-10-29 LAB — PNH PROFILE (-HIGH SENSITIVITY)
Number of markers:: 9
Viability (%): 46 %

## 2014-11-01 ENCOUNTER — Telehealth: Payer: Self-pay | Admitting: Oncology

## 2014-11-01 ENCOUNTER — Encounter: Payer: Self-pay | Admitting: Oncology

## 2014-11-01 ENCOUNTER — Other Ambulatory Visit: Payer: Self-pay | Admitting: Oncology

## 2014-11-01 DIAGNOSIS — I350 Nonrheumatic aortic (valve) stenosis: Secondary | ICD-10-CM

## 2014-11-01 NOTE — Progress Notes (Unsigned)
The panel for paroxysmal nocturnal hemoglobinuria came back negative. However Patricia Collier does have a high LDH and low haptoglobin, indicative of hemolysis. Her DAT is negative and her bilirubin is not elevated (0.5).  I reviewed her blood film shows no schistocytes. There are also no spherocytes. It is a very bland looking smear  I wonder if she is shearing some red cells because of a valvular issue. She did have mild to moderate aortic stenosis 3 years ago when last checked. It may be prudent to repeat an echocardiogram. I'm setting her up for this. I did call her and discuss all this with her. She is in agreement with this plan.

## 2014-11-01 NOTE — Telephone Encounter (Signed)
per pof to sch pt appt-sent Vaughan Basta email to pre-cert--will call once reply

## 2014-11-05 ENCOUNTER — Other Ambulatory Visit: Payer: Self-pay | Admitting: *Deleted

## 2014-11-05 ENCOUNTER — Other Ambulatory Visit: Payer: Self-pay | Admitting: Oncology

## 2014-11-09 ENCOUNTER — Telehealth: Payer: Self-pay | Admitting: Oncology

## 2014-11-09 ENCOUNTER — Other Ambulatory Visit: Payer: Self-pay | Admitting: Oncology

## 2014-11-09 ENCOUNTER — Telehealth: Payer: Self-pay | Admitting: *Deleted

## 2014-11-09 DIAGNOSIS — Z853 Personal history of malignant neoplasm of breast: Secondary | ICD-10-CM

## 2014-11-09 NOTE — Telephone Encounter (Signed)
PT. HAS NOT HEARD FROM ANYONE TO SCHEDULE HER ECHO. DOES SHE NEED THE ECHO DONE BEFORE PT. SEES DR.MAGRINAT?

## 2014-11-09 NOTE — Telephone Encounter (Signed)
Checked with Vaughan Basta today re pre auth and no pre auth required. S/w patient re echo at [facility] 5/5 @ 3pm. Also confirmed 5/6 f/u w/GM.

## 2014-11-10 ENCOUNTER — Other Ambulatory Visit: Payer: Self-pay | Admitting: *Deleted

## 2014-11-11 ENCOUNTER — Other Ambulatory Visit: Payer: Self-pay | Admitting: Oncology

## 2014-11-11 ENCOUNTER — Ambulatory Visit (HOSPITAL_COMMUNITY)
Admission: RE | Admit: 2014-11-11 | Discharge: 2014-11-11 | Disposition: A | Discharge status: Home / Self Care | Payer: 59 | Source: Ambulatory Visit | Attending: Oncology | Admitting: Oncology

## 2014-11-11 DIAGNOSIS — I350 Nonrheumatic aortic (valve) stenosis: Secondary | ICD-10-CM | POA: Insufficient documentation

## 2014-11-11 DIAGNOSIS — R0600 Dyspnea, unspecified: Secondary | ICD-10-CM | POA: Diagnosis not present

## 2014-11-11 NOTE — Progress Notes (Signed)
  Echocardiogram 2D Echocardiogram has been performed.  Patricia Collier 11/11/2014, 3:58 PM

## 2014-11-12 ENCOUNTER — Ambulatory Visit (HOSPITAL_BASED_OUTPATIENT_CLINIC_OR_DEPARTMENT_OTHER): Payer: 59 | Admitting: Oncology

## 2014-11-12 VITALS — BP 129/74 | HR 88 | Temp 97.8°F | Resp 18 | Ht 63.0 in | Wt 155.8 lb

## 2014-11-12 DIAGNOSIS — R310 Gross hematuria: Secondary | ICD-10-CM

## 2014-11-12 DIAGNOSIS — Z853 Personal history of malignant neoplasm of breast: Secondary | ICD-10-CM | POA: Diagnosis not present

## 2014-11-12 DIAGNOSIS — C50912 Malignant neoplasm of unspecified site of left female breast: Secondary | ICD-10-CM

## 2014-11-12 NOTE — Progress Notes (Signed)
ID: [name]   DOB: [birth year]  MR#: [id number]  SFS#:[id number]  PCP: Annye Asa, MD GYN: Dory Horn SU: [Chris Streck] OTHER MD: Gaynelle Arabian, Glori Bickers, Doren Custard  CHIEF COMPLAINT: gross hematuria  CURRENT TREATMENT:  Ongoing workup  HISTORY OF PRESENT ILLNESS: Burman Nieves tells me she has a history of gross hematuria going back perhaps 4 years. This occurred maybe 4 years ago one time and then may be 3 years ago one time. She did see Dr. Risa Grill then. She tells me he did a cystoscopy which was unremarkable and likely he obtained a CT as well to rule out renal stones but I do not have that data.   In any case the problem apparently resolved, but recurred November 2015. At this time every time she walked (not jog) she would developed gross hematuria. She brought it again to the attention of Dr. Risa Grill and he obtained a noncontrast abdomen and pelvis CT 06/21/2014 which showed no evidence of stones, hydronephrosis, or other kidney abnormality beyond a 1.4 cm cyst in the upper pole of the left kidney which was read as stable compared to a December 2011 chest CT. Repeat cystoscopy again was unremarkable.  In mid December Maggie had symptoms of a possible urinary tract infection. She saw Dr. Birdie Riddle regarding this and the culture did grow Escherichia coli (at 06/22/2015. She was treated with ciprofloxacin initially, but when the sensitivities came in and it proved to be resistance Dr. Birdie Riddle switched her to Macrodantin. However Burman Nieves "never saw that order" so she never did take Macrodantin. Nevertheless the urinary tract infection symptoms resolved.  The gross hematuria however continued. A repeat CT of the abdomen and pelvis was obtained 09/15/2014. This was again essentially negative, except that suggested a possible abnormality in the head of the pancreas. For that reason an MRI of the abdomen was obtained 09/29/2014. The pancreatic abnormality appears to have  been fatty atrophy. The kidney cortex however was found to be hypointense on T2 images. This was interpreted as "highly suspicious for paroxysmal nocturnal hemoglobinuria". There were no calcifications there was no hydronephrosis, there was no evidence of stones and the previously noted cyst was unchanged.  Accordingly Burman Nieves was referred back to me for hematologic evaluation of possible PNH. The results of that are entirely negative: She clearly does not have PNH. However as part of the Malcolm workup we usually obtain a hemolysis workup and while she is DAT negative she does have an elevated LDH and a very low haptoglobin. She does not have an elevated total bilirubin or anemia.  This was suggestive of intravascular hemolysis. I looked at the blood smear which did not show sheared red cells, but in any case I obtained an echocardiogram since the echo she had an 2013 showed a lowish ejection fraction at 45% and some mild valvular changes. Repeat echo obtained 11/11/2014 however shows no significant valvular abnormalities and incidentally the ejection fraction is now normal at 55%.  INTERVAL HISTORY: Burman Nieves was evaluated in the hematology clinic 11/12/2014. Of course I formally followed her for history of breast cancer. She was released from follow-up here May 2014. She is scheduled for repeat mammography next week, but there is no evidence of breast cancer recurrence to date  REVIEW OF SYSTEMS: She tells me the hematuria problem does not occur when she does Zumba. It reliably occurs when she walks a mile or 2. Again she does not jog. She does not have any flank pain or pelvic pain.  She does have epigastric pain which is strongly suggestive of reflux. She is currently not on any reflux medication. There is no history of fever, rash, or arthritis. She has a history of palpitations at times, occasional "chest tightness", some shortness of breath when she climbs stairs, but overall a detailed review of systems  today was noncontributory except as noted.  BREAST CANCER HISTORY: (inactive) From the original intake note:  She is very regular in performing breast self exams and when she did that in early February 2009, she noted a difference.  It was not very well defined, but nevertheless it was different from before.  She brought this to Dr. Verlon Au attention, and he set her up for bilateral diagnostic mammography at [facility] on 09-10-07.  There was a spiculated mass with some faint calcifications in the upper portion of the left breast which on physical examination was palpable.  By ultrasound it was spiculated, solid, taller than wide, and measured up to 11 mm.  The left axilla was fine as was the right breast and axilla.  This was discussed with Dr. Sadie Haber and biopsy was performed the same day, showing ([id number] [phone]) an invasive ductal carcinoma which was 6% ER positive, 2% PR positive with a borderline MIB-1 at 18% and HercepTest negative at 0%.    With this information, the patient was referred to Dr. Margot Chimes and bilateral breast MRI's were obtained on 09-19-07.  This basically showed only the solitary abnormality in question (upper inner quadrant of the left breast) measuring 1.4 cm. by MRI.    With this information, after appropriate discussion, the patient proceeded to left lumpectomy with sentinel lymph node biopsy on 10-17-07.  The final pathology [id number]) showed a 1.2 cm. grade 3 invasive ductal carcinoma with no evidence of lymphovascular invasion (although suspicion of lymphovascular invasion had been noted from the biopsy core), with ample margins and the sentinel lymph node negative for tumor spread.  Her subsequent breast cancer history is detailed below.   PAST MEDICAL HISTORY: 1. Remote history of tobacco use, the patient definitively quitting smoking 17 years ago. 2. Status post cholecystectomy. 3. Status post tonsillectomy and adenoidectomy. 4. Status post surgery to  the right eye involving right cataract surgery. 5. Status post septoplasty. 6. Status post "face lift".  7. History of bilateral salpingo-oophorectomy in 2000. 8. History of hysterectomy in 1984. 9. History of laparoscopy for lysis of adhesions in 1991 and 1984.  10. Eczema. 64. Eclampsia with her pregnancy in 1977.  12. Borderline vitamin D deficiency. 13. History of SVT  FAMILY HISTORY The patient's father died at the age of 73 from heart failure.  The patient's mother died at age 63 also with heart problems.  The patient is one of ten siblings, four half-brothers and three half-sisters.  All on the father's side have died, one of those sisters died from breast cancer about 30 years ago, she was 61 at the time of diagnosis and lived about a year.  The patient's full brother is age 34, he has multiple skin cancers and has required radical neck surgery as a result.  There is no history of breast or ovarian cancer in the family otherwise.   GYNECOLOGIC HISTORY: She is GX P2, first pregnancy age 55.  She took hormone replacement for approximately 15 years, ending on 09-06-07.    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  Her husband Wille Glaser owns a business that provides Public relations account executive to  buildings.  Seward Grater has two children from a prior marriage - Jonny Ruiz works with [employer] and he is also a Technical sales engineer; he is about to become a father. Maggie's older daughter, Raynelle Fanning, lives in [place], is divorced and may be bipolar.  Seward Grater has a grandchild, Phineas Semen, 28 years old, who Seward Grater is pretty much crazy about.   Maggie attends [employer] currently. 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   ADVANCED DIRECTIVES: in place  HEALTH MAINTENANCE: History  Substance Use Topics  . Smoking status: Former Smoker -- 2.00 packs/day for 10 years    Types: Cigarettes    Quit date: 07/09/1989  . Smokeless tobacco: Not on file     Comment: 12 ys ago.  . Alcohol Use: No      Colonoscopy: 2013  PAP: Neal  Bone density:  Lipid panel:  Allergies  Allergen Reactions  . Hydrocodone-Acetaminophen     REACTION: itching    No current outpatient prescriptions on file.   No current facility-administered medications for this visit.    OBJECTIVE: Middle-aged white woman who appears younger than stated age 12 Vitals:   11/12/14 0829  BP: 129/74  Pulse: 88  Temp: 97.8 F (36.6 C)  Resp: 18     Body mass index is 27.61 kg/(m^2).    ECOG FS: 1  Sclerae unicteric, pupils round and equal, EOMs intact Oropharynx clear, dentition in good repair No cervical or supraclavicular adenopathy Lungs no rales or rhonchi Heart regular rate and rhythm Abd soft, nontender, positive bowel sounds, no masses palpated, no flank discomfort MSK no focal spinal tenderness, no upper extremity lymphedema, no joint swelling Neuro: nonfocal, well oriented, positive affect Breasts: The right breast is unremarkable. The left breast is status post lumpectomy and radiation. There is no evidence of local recurrence. The left axilla is benign Skin: No rash or suspicious lesions noted   LAB RESULTS:   Ref Range 2wk ago    INTERPRETATION  REPORT   Comments: No flow cytometric evidence of PNH (See table below for the results ofthe erythroid and leukocyte populations).   Flow Cytometry Reviewed by:  REPORT   Comments: Eduard Clos, Ph.D. and Rolena Infante, M.D   COMMENTS:  REPORT   Comments: No evidence of loss of any of the 5 GPI-linked markers listed below.   Specimen Type:  REPORT   Comments: PERIPHERAL BLOOD   Clinical Information  Rule out PNH   Viability (%) % 46   Population(s) gated:  REPORT   Comments: Erythrocytes/Granulocytes/Monocytes   Abormal Cells Detected:  No   % Abnormal:  As below   Flow Differential (%)  Not reported   RBCs  REPORT   Comments: Normal CD59 level.   Granulocytes:  REPORT   Comments: No decreased/absent expression  of GPI-linked markers and FLAER.   Monocytes  REPORT   Comments: No decreased/absent expression of GPI-linked markers and FLAER.   Number of markers:          DAT, Polyspecific NEGATIVE  NEG       Haptoglobin 43 - 212 mg/dL <74 (L)        Total Bilirubin 0.2 - 1.2 mg/dL 0.5 1.0    Bilirubin, Direct 0.0 - 0.3 mg/dL 0.1 0.1    Indirect Bilirubin 0.2 - 1.2 mg/dL 0.4     Alkaline Phosphatase 39 - 117 U/L 131 (H) 117    AST 0 - 37 U/L 31 20    ALT 0 - 35 U/L 26 16  Total Protein 6.0 - 8.3 g/dL 6.2 6.9    Albumin 3.5 - 5.2 g/dL 4.2 4.3   Resulting Agency             LDH 125 - 245 U/L 630 (H)       Blood film: review discussed in HPI; no significant abnormalities   Lab Results  Component Value Date   WBC 5.9 10/26/2014   NEUTROABS 4.2 10/26/2014   HGB 13.5 10/26/2014   HCT 40.3 10/26/2014   MCV 90.8 10/26/2014   PLT 200 10/26/2014      Chemistry      Component Value Date/Time   NA 138 11/16/2013 1437   NA 140 11/11/2012 0920   K 3.7 11/16/2013 1437   K 4.0 11/11/2012 0920   CL 105 11/16/2013 1437   CL 107 11/11/2012 0920   CO2 26 11/16/2013 1437   CO2 24 11/11/2012 0920   BUN 20 11/16/2013 1437   BUN 18.1 11/11/2012 0920   CREATININE 0.6 11/16/2013 1437   CREATININE 0.8 11/11/2012 0920      Component Value Date/Time   CALCIUM 9.5 11/16/2013 1437   CALCIUM 9.2 11/11/2012 0920   ALKPHOS 131* 10/26/2014 0839   ALKPHOS 121 11/11/2012 0920   AST 31 10/26/2014 0839   AST 18 11/11/2012 0920   ALT 26 10/26/2014 0839   ALT 13 11/11/2012 0920   BILITOT 0.5 10/26/2014 0839   BILITOT 0.72 11/11/2012 0920       Lab Results  Component Value Date   LABCA2 18 10/19/2010    No components found for: [id number]  No results for input(s): INR in the last 168 hours.  Urinalysis    Component Value Date/Time   COLORURINE YELLOW 10/16/2007 0859   APPEARANCEUR CLEAR 10/16/2007 0859   LABSPEC 1.015 01/19/2008 1209   LABSPEC 1.006 10/16/2007 0859    PHURINE 7.5 01/19/2008 1209   PHURINE 7.0 10/16/2007 0859   GLUCOSEU NEGATIVE 10/16/2007 0859   HGBUR Negative 01/19/2008 1209   HGBUR NEGATIVE 10/16/2007 0859   BILIRUBINUR negative 06/21/2014 1641   BILIRUBINUR Negative 01/19/2008 1209   BILIRUBINUR NEGATIVE 10/16/2007 0859   KETONESUR Negative 01/19/2008 1209   KETONESUR NEGATIVE 10/16/2007 0859   PROTEINUR 3+ 06/21/2014 1641   PROTEINUR Negative 01/19/2008 1209   PROTEINUR NEGATIVE 10/16/2007 0859   UROBILINOGEN 2.0 06/21/2014 1641   UROBILINOGEN 0.2 10/16/2007 0859   NITRITE positive 06/21/2014 1641   NITRITE Negative 01/19/2008 1209   NITRITE NEGATIVE 10/16/2007 0859   LEUKOCYTESUR moderate (2+) 06/21/2014 1641   LEUKOCYTESUR Negative 01/19/2008 1209    STUDIES: Transthoracic Echocardiography  Patient:  Shonna, Deiter MR #:    [id number] Study Date: 11/11/2014 Gender:   F Age:    69 Height:   160 cm Weight:   67.1 kg BSA:    1.74 m^2 Pt. Status: Room:  ATTENDING  Heidee Audi, Valli Glance   Siearra Amberg, Virgie Dad REFERRING  Shanieka Blea, Virgie Dad PERFORMING  [facility], Inpatient SONOGRAPHER Roseanna Rainbow  cc:  ------------------------------------------------------------------- LV EF: 55%  ------------------------------------------------------------------- Indications:   Dyspnea 786.09. Neoplasm - breast 174.9.  ------------------------------------------------------------------- History:  PMH:  Angina pectoris. Risk factors: Dyslipidemia.  ------------------------------------------------------------------- Study Conclusions  - Left ventricle: The cavity size was normal. Wall thickness was normal. The estimated ejection fraction was 55%. Hypokinesis of the apical septum. GLS -16.9%. Doppler parameters are consistent with abnormal left ventricular relaxation (grade 1 diastolic dysfunction). - Aortic valve: Trileaflet; moderately calcified leaflets. There was mild  stenosis. Mean gradient (S): 18 mm  Hg. Valve area (Vmax): 2.34 cm^2. - Mitral valve: Mildly calcified annulus. There was no significant regurgitation. - Right ventricle: The cavity size was normal. Systolic function was normal. - Pulmonary arteries: No complete TR doppler jet so unable to estimate PA systolic pressure. - Inferior vena cava: The vessel was normal in size. The respirophasic diameter changes were in the normal range (>= 50%), consistent with normal central venous pressure.  Impressions:  - Normal LV size and systolic function. EF 55%. Apical septal hypokinesis. Normal RV size and systolic function. No significant valvular abnormalities.  CLINICAL DATA: Possible pancreatic head/ uncinate process mass on CT. Gross hematuria and epigastric pain. Breast cancer 7 years ago.  EXAM: MRI ABDOMEN WITHOUT AND WITH CONTRAST  TECHNIQUE: Multiplanar multisequence MR imaging of the abdomen was performed both before and after the administration of intravenous contrast.  CONTRAST: 65mL MULTIHANCE GADOBENATE DIMEGLUMINE 529 MG/ML IV SOLN  COMPARISON: CT 09/15/2014 and 06/21/2014.  FINDINGS: Portions of exam are mildly motion degraded.  Lower chest: Normal heart size without pericardial or pleural effusion.  Hepatobiliary: Tiny hepatic lesions which are likely cysts. Cholecystectomy.  Pancreas: No correlate for the questioned abnormality within the pancreatic head/ uncinate process. No precontrast signal abnormality, abnormal enhancement, or restricted diffusion. No pancreatic duct dilatation.  Spleen: Normal  Adrenals/Urinary Tract: Normal adrenal glands. An upper pole left renal cyst.  T2 hypo intensity involving the renal cortex bilaterally. Example on images 18 and 23 of series 3. No calcifications in this area on prior CT. No hydronephrosis.  Stomach/Bowel: Normal stomach and small bowel. Colonic stool burden suggests  constipation.  Vascular/Lymphatic: Normal caliber of the aorta and branch vessels. No retroperitoneal or retrocrural adenopathy.  Other: No ascites.  Musculoskeletal: No acute osseous abnormality. Focus of enhancement within the lower thoracic spine is favored to related to a Schmorl's node, tiny.  IMPRESSION: 1. Mildly motion degraded exam. 2. No MR correlate for the questioned abnormality within the pancreatic head/uncinate process. Favored to represent mild fatty atrophy. 3. Decreased T2 signal within the renal cortices bilaterally. Appearance is highly suspicious for paroxysmal nocturnal hemoglobinuria. 4. Possible constipation.   Electronically Signed  By: Abigail Miyamoto M.D.  On: 09/29/2014 17:10  ASSESSMENT: 69 y.o.  [place] woman   (1) status post left lumpectomy and sentinel lymph node biopsy April 2009 for a T1cN0, grade 3, invasive ductal carcinoma, ER 6% and PR 2% "positive," with an MIB-1 of 18%, no HER-2 amplification.  She received dose-dense doxorubicin and cyclophosphamide x4, then weekly paclitaxel x12.  After radiation therapy completed December 2009, she was tried on tamoxifen and letrozole with very poor tolerance. There is no evidence of disease recurrence to date  (1) gross hematuria: as discussed in HPI   PLAN: I spent approximately 50 minutes with Burman Nieves going over her situation in detail. She certainly does not have [place];  she does have however hemolysis, as evidenced by a very low haptoglobin and a high LDH. There is no increase in the total bilirubin, and she is not anemic. DAT is negative. Even though review of the blood film did not show sheared cells, she had an abnormal echocardiogram in 2013 and I repeated that to see if valvular abnormalities these could explain these findings. The echo is essentially unremarkable.  She did have a urinary tract infection with Cipro resistant Escherichia coli, treated with Cipro only since the patient  never started nitrofurantoin. However this is currently entirely asymptomatic.  We are left with repeated gross hematuria provoked by walking, associated  with cortical changes in both kidneys noted on MRI, but an otherwise negative urologic workup.  I think she needs to see a nephrologist, likely for renal biopsy, and I am referring her to Dr. Marval Regal. I think the hemolysis is secondary to whatever the processes in her kidneys, and likely would resolve when that is addressed. The epigastric pain she has is classic for reflux and I suggested she start omeprazole for that.    I am not making Maggie a return appointment here, although of course I will be glad to participate in her care as appropriate in the future and I am certainly interested in the results of further workup. She knows to call for any other problems that may develop that we can assist with.  Lawsyn Heiler C    11/12/2014

## 2014-11-17 ENCOUNTER — Ambulatory Visit
Admission: RE | Admit: 2014-11-17 | Discharge: 2014-11-17 | Disposition: A | Discharge status: Home / Self Care | Payer: 59 | Source: Ambulatory Visit | Attending: Oncology | Admitting: Oncology

## 2014-11-17 DIAGNOSIS — Z853 Personal history of malignant neoplasm of breast: Secondary | ICD-10-CM

## 2015-01-03 ENCOUNTER — Other Ambulatory Visit: Payer: Self-pay

## 2015-03-09 ENCOUNTER — Other Ambulatory Visit (HOSPITAL_COMMUNITY): Payer: Self-pay | Admitting: Sports Medicine

## 2015-03-09 DIAGNOSIS — M25562 Pain in left knee: Secondary | ICD-10-CM

## 2015-03-11 ENCOUNTER — Ambulatory Visit (HOSPITAL_COMMUNITY)
Admission: RE | Admit: 2015-03-11 | Discharge: 2015-03-11 | Disposition: A | Discharge status: Home / Self Care | Payer: 59 | Source: Ambulatory Visit | Attending: Sports Medicine | Admitting: Sports Medicine

## 2015-03-11 DIAGNOSIS — M659 Synovitis and tenosynovitis, unspecified: Secondary | ICD-10-CM | POA: Insufficient documentation

## 2015-03-11 DIAGNOSIS — M25562 Pain in left knee: Secondary | ICD-10-CM | POA: Diagnosis present

## 2015-03-11 DIAGNOSIS — M7122 Synovial cyst of popliteal space [Baker], left knee: Secondary | ICD-10-CM | POA: Insufficient documentation

## 2015-03-11 DIAGNOSIS — M25462 Effusion, left knee: Secondary | ICD-10-CM | POA: Diagnosis not present

## 2015-03-11 DIAGNOSIS — M1712 Unilateral primary osteoarthritis, left knee: Secondary | ICD-10-CM | POA: Diagnosis not present

## 2015-03-11 DIAGNOSIS — M2392 Unspecified internal derangement of left knee: Secondary | ICD-10-CM | POA: Diagnosis not present

## 2015-03-11 DIAGNOSIS — S83282A Other tear of lateral meniscus, current injury, left knee, initial encounter: Secondary | ICD-10-CM | POA: Insufficient documentation

## 2015-07-06 ENCOUNTER — Telehealth: Payer: Self-pay | Admitting: *Deleted

## 2015-07-06 NOTE — Telephone Encounter (Signed)
This RN received a forwarded message from HIM department- message was from " [place] with [employer] ".  Message stated " this pt does not wish to be seen at this time ". " if Dr Jana Hakim wants her to be seen by this office he would need to place a new referral ".  Above will be given to MD for review.

## 2015-08-08 MED FILL — PHENTERMINE 37.5 MG TABLET: 37.5 | 30 days supply | Qty: 30 | Fill #1

## 2015-08-11 ENCOUNTER — Encounter: Payer: Self-pay | Admitting: Family Medicine

## 2015-08-11 DIAGNOSIS — M25562 Pain in left knee: Secondary | ICD-10-CM | POA: Diagnosis not present

## 2015-08-11 DIAGNOSIS — M23342 Other meniscus derangements, anterior horn of lateral meniscus, left knee: Secondary | ICD-10-CM | POA: Diagnosis not present

## 2015-08-11 MED ORDER — PROMETHAZINE-DM 6.25-15 MG/5ML PO SYRP: 5.0000 mL | ORAL_SOLUTION | Freq: Four times a day (QID) | ORAL | Status: DC | PRN

## 2015-08-11 MED FILL — PROMETHAZINE-DM SYRUP: 6.25-15 | 12 days supply | Qty: 240 | Fill #0

## 2015-08-29 MED FILL — ONDANSETRON ODT 4 MG TABLET: 4 | 4 days supply | Qty: 15 | Fill #2

## 2015-09-12 MED FILL — PHENTERMINE 37.5 MG TABLET: 37.5 | 30 days supply | Qty: 30 | Fill #2

## 2015-09-13 ENCOUNTER — Ambulatory Visit: Payer: Self-pay | Admitting: Orthopedic Surgery

## 2015-09-13 NOTE — Progress Notes (Signed)
Preoperative surgical orders have been place into the Epic hospital system for [name] on 09/13/2015, 10:53 AM  by Mickel Crow for surgery on 09-21-2015.  Preop Knee Scope orders including IV Tylenol and IV Decadron as long as there are no contraindications to the above medications. Arlee Muslim, PA-C

## 2015-09-14 ENCOUNTER — Encounter (HOSPITAL_COMMUNITY)
Admission: RE | Admit: 2015-09-14 | Discharge: 2015-09-14 | Disposition: A | Discharge status: Home / Self Care | Payer: 59 | Source: Ambulatory Visit | Attending: Orthopedic Surgery | Admitting: Orthopedic Surgery

## 2015-09-14 ENCOUNTER — Encounter (HOSPITAL_COMMUNITY): Payer: Self-pay

## 2015-09-14 DIAGNOSIS — S83242A Other tear of medial meniscus, current injury, left knee, initial encounter: Secondary | ICD-10-CM | POA: Insufficient documentation

## 2015-09-14 DIAGNOSIS — X58XXXA Exposure to other specified factors, initial encounter: Secondary | ICD-10-CM | POA: Insufficient documentation

## 2015-09-14 DIAGNOSIS — Z01812 Encounter for preprocedural laboratory examination: Secondary | ICD-10-CM | POA: Insufficient documentation

## 2015-09-14 HISTORY — DX: Myoneural disorder, unspecified: G70.9

## 2015-09-14 HISTORY — DX: Unspecified osteoarthritis, unspecified site: M19.90

## 2015-09-14 HISTORY — DX: Supraventricular tachycardia, unspecified: I47.10

## 2015-09-14 HISTORY — DX: Supraventricular tachycardia: I47.1

## 2015-09-14 LAB — CBC
HEMATOCRIT: 45 % (ref 36.0–46.0)
Hemoglobin: 15.1 g/dL — ABNORMAL HIGH (ref 12.0–15.0)
MCH: 30.5 pg (ref 26.0–34.0)
MCHC: 33.6 g/dL (ref 30.0–36.0)
MCV: 90.9 fL (ref 78.0–100.0)
PLATELETS: 222 10*3/uL (ref 150–400)
RBC: 4.95 MIL/uL (ref 3.87–5.11)
RDW: 13.3 % (ref 11.5–15.5)
WBC: 6.2 10*3/uL (ref 4.0–10.5)

## 2015-09-14 NOTE — Pre-Procedure Instructions (Signed)
Echo 5'16 Epic.

## 2015-09-14 NOTE — Patient Instructions (Signed)
Patricia Collier  09/14/2015   Your procedure is scheduled on: 09-21-15  Report to [facility] Main  Entrance take [facility]  elevators to 3rd floor to  [place] at 1:00 PM.  Call this number if you have problems the morning of surgery [phone]   Remember: ONLY 1 PERSON MAY GO WITH YOU TO SHORT STAY TO GET  READY MORNING OF [place].  Do not eat food or drink liquids :After Midnight.     Take these medicines the morning of surgery with A SIP OF WATER:  None. DO NOT TAKE ANY DIABETIC MEDICATIONS DAY OF YOUR SURGERY                               You may not have any metal on your body including hair pins and              piercings  Do not wear jewelry, make-up, lotions, powders or perfumes, deodorant             Do not wear nail polish.  Do not shave  48 hours prior to surgery.              Men may shave face and neck.   Do not bring valuables to the hospital. [place].  Contacts, dentures or bridgework may not be worn into surgery.  Leave suitcase in the car. After surgery it may be brought to your room.     Patients discharged the day of surgery will not be allowed to drive home.  Name and phone number of your driver:Joe- spouse [phone]  Special Instructions: N/A              Please read over the following fact sheets you were given: _____________________________________________________________________              ________________________________________________________________________ [facility] - Preparing for Surgery Before surgery, you can play an important role.  Because skin is not sterile, your skin needs to be as free of germs as possible.  You can reduce the number of germs on your skin by washing with CHG (chlorahexidine gluconate) soap before surgery.  CHG is an antiseptic cleaner which kills germs and bonds with the skin to continue killing germs even after  washing. Please DO NOT use if you have an allergy to CHG or antibacterial soaps.  If your skin becomes reddened/irritated stop using the CHG and inform your nurse when you arrive at Short Stay. Do not shave (including legs and underarms) for at least 48 hours prior to the first CHG shower.  You may shave your face/neck. Please follow these instructions carefully:  1.  Shower with CHG Soap the night before surgery and the  morning of Surgery.  2.  If you choose to wash your hair, wash your hair first as usual with your  normal  shampoo.  3.  After you shampoo, rinse your hair and body thoroughly to remove the  shampoo.                           4.  Use CHG as you would any other liquid soap.  You can apply chg directly  to the  skin and wash                       Gently with a scrungie or clean washcloth.  5.  Apply the CHG Soap to your body ONLY FROM THE NECK DOWN.   Do not use on face/ open                           Wound or open sores. Avoid contact with eyes, ears mouth and genitals (private parts).                       Wash face,  Genitals (private parts) with your normal soap.             6.  Wash thoroughly, paying special attention to the area where your surgery  will be performed.  7.  Thoroughly rinse your body with warm water from the neck down.  8.  DO NOT shower/wash with your normal soap after using and rinsing off  the CHG Soap.                9.  Pat yourself dry with a clean towel.            10.  Wear clean pajamas.            11.  Place clean sheets on your bed the night of your first shower and do not  sleep with pets. Day of Surgery : Do not apply any lotions/deodorants the morning of surgery.  Please wear clean clothes to the hospital/surgery center.  FAILURE TO FOLLOW THESE INSTRUCTIONS MAY RESULT IN THE CANCELLATION OF YOUR SURGERY PATIENT SIGNATURE_________________________________  NURSE  SIGNATURE__________________________________  ________________________________________________________________________   Adam Phenix  An incentive spirometer is a tool that can help keep your lungs clear and active. This tool measures how well you are filling your lungs with each breath. Taking long deep breaths may help reverse or decrease the chance of developing breathing (pulmonary) problems (especially infection) following:  A long period of time when you are unable to move or be active. BEFORE THE PROCEDURE   If the spirometer includes an indicator to show your best effort, your nurse or respiratory therapist will set it to a desired goal.  If possible, sit up straight or lean slightly forward. Try not to slouch.  Hold the incentive spirometer in an upright position. INSTRUCTIONS FOR USE  1. Sit on the edge of your bed if possible, or sit up as far as you can in bed or on a chair. 2. Hold the incentive spirometer in an upright position. 3. Breathe out normally. 4. Place the mouthpiece in your mouth and seal your lips tightly around it. 5. Breathe in slowly and as deeply as possible, raising the piston or the ball toward the top of the column. 6. Hold your breath for 3-5 seconds or for as long as possible. Allow the piston or ball to fall to the bottom of the column. 7. Remove the mouthpiece from your mouth and breathe out normally. 8. Rest for a few seconds and repeat Steps 1 through 7 at least 10 times every 1-2 hours when you are awake. Take your time and take a few normal breaths between deep breaths. 9. The spirometer may include an indicator to show your best effort. Use the indicator as a goal to work toward during each repetition. 10. After each set of  10 deep breaths, practice coughing to be sure your lungs are clear. If you have an incision (the cut made at the time of surgery), support your incision when coughing by placing a pillow or rolled up towels firmly  against it. Once you are able to get out of bed, walk around indoors and cough well. You may stop using the incentive spirometer when instructed by your caregiver.  RISKS AND COMPLICATIONS  Take your time so you do not get dizzy or light-headed.  If you are in pain, you may need to take or ask for pain medication before doing incentive spirometry. It is harder to take a deep breath if you are having pain. AFTER USE  Rest and breathe slowly and easily.  It can be helpful to keep track of a log of your progress. Your caregiver can provide you with a simple table to help with this. If you are using the spirometer at home, follow these instructions: [place] IF:   You are having difficultly using the spirometer.  You have trouble using the spirometer as often as instructed.  Your pain medication is not giving enough relief while using the spirometer.  You develop fever of 100.5 F (38.1 C) or higher. SEEK IMMEDIATE MEDICAL CARE IF:   You cough up bloody sputum that had not been present before.  You develop fever of 102 F (38.9 C) or greater.  You develop worsening pain at or near the incision site. MAKE SURE YOU:   Understand these instructions.  Will watch your condition.  Will get help right away if you are not doing well or get worse. Document Released: 11/05/2006 Document Revised: 09/17/2011 Document Reviewed: 01/06/2007 [facility] Patient Information 2014 [place], [place].   ________________________________________________________________________

## 2015-09-15 ENCOUNTER — Ambulatory Visit (INDEPENDENT_AMBULATORY_CARE_PROVIDER_SITE_OTHER): Payer: 59

## 2015-09-15 ENCOUNTER — Ambulatory Visit (INDEPENDENT_AMBULATORY_CARE_PROVIDER_SITE_OTHER): Payer: 59 | Admitting: Podiatry

## 2015-09-15 ENCOUNTER — Ambulatory Visit: Payer: Self-pay

## 2015-09-15 ENCOUNTER — Encounter: Payer: Self-pay | Admitting: Podiatry

## 2015-09-15 VITALS — BP 113/84 | HR 99 | Resp 16

## 2015-09-15 DIAGNOSIS — M79675 Pain in left toe(s): Secondary | ICD-10-CM | POA: Diagnosis not present

## 2015-09-15 DIAGNOSIS — M2040 Other hammer toe(s) (acquired), unspecified foot: Secondary | ICD-10-CM | POA: Diagnosis not present

## 2015-09-15 DIAGNOSIS — M7751 Other enthesopathy of right foot: Secondary | ICD-10-CM

## 2015-09-15 DIAGNOSIS — M778 Other enthesopathies, not elsewhere classified: Secondary | ICD-10-CM

## 2015-09-15 DIAGNOSIS — M779 Enthesopathy, unspecified: Secondary | ICD-10-CM

## 2015-09-15 DIAGNOSIS — M79674 Pain in right toe(s): Secondary | ICD-10-CM

## 2015-09-15 NOTE — Patient Instructions (Signed)
Pre-Operative Instructions  Congratulations, you have decided to take an important step to improving your quality of life.  You can be assured that the doctors of [facility] will be with you every step of the way.  1. Plan to be at the surgery center/hospital at least 1 (one) hour prior to your scheduled time unless otherwise directed by the surgical center/hospital staff.  You must have a responsible adult accompany you, remain during the surgery and drive you home.  Make sure you have directions to the surgical center/hospital and know how to get there on time. 2. For hospital based surgery you will need to obtain a history and physical form from your family physician within 1 month prior to the date of surgery- we will give you a form for you primary physician.  3. We make every effort to accommodate the date you request for surgery.  There are however, times where surgery dates or times have to be moved.  We will contact you as soon as possible if a change in schedule is required.   4. No Aspirin/Ibuprofen for one week before surgery.  If you are on aspirin, any non-steroidal anti-inflammatory medications (Mobic, Aleve, Ibuprofen) you should stop taking it 7 days prior to your surgery.  You make take Tylenol  For pain prior to surgery.  5. Medications- If you are taking daily heart and blood pressure medications, seizure, reflux, allergy, asthma, anxiety, pain or diabetes medications, make sure the surgery center/hospital is aware before the day of surgery so they may notify you which medications to take or avoid the day of surgery. 6. No food or drink after midnight the night before surgery unless directed otherwise by surgical center/hospital staff. 7. No alcoholic beverages 24 hours prior to surgery.  No smoking 24 hours prior to or 24 hours after surgery. 8. Wear loose pants or shorts- loose enough to fit over bandages, boots, and casts. 9. No slip on shoes, sneakers are best. 10. Bring  your boot with you to the surgery center/hospital.  Also bring crutches or a walker if your physician has prescribed it for you.  If you do not have this equipment, it will be provided for you after surgery. 11. If you have not been contracted by the surgery center/hospital by the day before your surgery, call to confirm the date and time of your surgery. 12. Leave-time from work may vary depending on the type of surgery you have.  Appropriate arrangements should be made prior to surgery with your employer. 13. Prescriptions will be provided immediately following surgery by your doctor.  Have these filled as soon as possible after surgery and take the medication as directed. 14. Remove nail polish on the operative foot. 15. Wash the night before surgery.  The night before surgery wash the foot and leg well with the antibacterial soap provided and water paying special attention to beneath the toenails and in between the toes.  Rinse thoroughly with water and dry well with a towel.  Perform this wash unless told not to do so by your physician.  Enclosed: 1 Ice pack (please put in freezer the night before surgery)   1 Hibiclens skin cleaner   Pre-op Instructions  If you have any questions regarding the instructions, do not hesitate to call our office.  [place]: [address] [place], [place] [address] [phone]  [place]: [address] [place], [place] [address] [phone]  [place]: [address]  [place],  [address] [phone]  Dr. Richard   Tuchman DPM, Dr. Norman Regal DPM Dr. Richard Sikora DPM, Dr. M. Todd Hyatt DPM, Dr. Kathryn Egerton DPM 

## 2015-09-15 NOTE — Progress Notes (Signed)
   Subjective:    Patient ID: Patricia Collier, female    DOB: Apr 09, 1946, 70 y.o.   MRN: [id number]  HPI: She presents today with a chief complaint of a painful second metatarsophalangeal joint of the right foot. She says that both her but the right with seems to be bothering her the most. She denies trauma. She states this been going on for a long time but just seems to be getting worse. It is starting to affect her ability to perform her daily activities.  Review of Systems  Musculoskeletal: Positive for arthralgias and gait problem.  All other systems reviewed and are negative.      Objective:   Physical Exam: Vital signs are stable she is alert and oriented 3 pulses are palpable. No apparent distress. Neurologic sensorium is intact percent C monofilament deep tendon reflexes are intact. Muscle strength is 5 over 5 dorsiflexion splint flexors and inverters everters on physical musculature is intact. Orthopedic evaluation does demonstrate hammertoe deformity second right with medial deviation of the toe pain on palpation of the second metatarsophalangeal joint. Radiographs confirm elongated second metatarsal with medial deviation of the second toe cutaneous evaluations traceable hyperacute is no erythema edema psoas drainage or odor.      Assessment & Plan:  Assessment: Capsulitis dislocation syndrome second toe right.  Plan: We discussed the etiology pathology conservative versus surgical therapies at this point we consented her for a second metatarsal osteotomy hammertoe repair with pinning possible second toe right foot. I answered all questions regarding these procedures to the best of my ability in layman's terms and she understood. She signed all 3 pages of the consent form. We did discuss the possible postop complications which may include but are not limited to postop pain bleeding swelling infection recurrence and need for further surgery.

## 2015-09-21 ENCOUNTER — Ambulatory Visit (HOSPITAL_COMMUNITY): Payer: 59 | Admitting: Anesthesiology

## 2015-09-21 ENCOUNTER — Telehealth: Payer: Self-pay | Admitting: *Deleted

## 2015-09-21 ENCOUNTER — Encounter (HOSPITAL_COMMUNITY): Payer: Self-pay | Admitting: *Deleted

## 2015-09-21 ENCOUNTER — Ambulatory Visit (HOSPITAL_COMMUNITY)
Admission: RE | Admit: 2015-09-21 | Discharge: 2015-09-21 | Disposition: A | Discharge status: Home / Self Care | Payer: 59 | Source: Ambulatory Visit | Attending: Orthopedic Surgery | Admitting: Orthopedic Surgery

## 2015-09-21 ENCOUNTER — Encounter (HOSPITAL_COMMUNITY)
Admission: RE | Disposition: A | Discharge status: Home / Self Care | Payer: Self-pay | Source: Ambulatory Visit | Attending: Orthopedic Surgery

## 2015-09-21 DIAGNOSIS — Z853 Personal history of malignant neoplasm of breast: Secondary | ICD-10-CM | POA: Insufficient documentation

## 2015-09-21 DIAGNOSIS — Z9049 Acquired absence of other specified parts of digestive tract: Secondary | ICD-10-CM | POA: Diagnosis not present

## 2015-09-21 DIAGNOSIS — Z923 Personal history of irradiation: Secondary | ICD-10-CM | POA: Insufficient documentation

## 2015-09-21 DIAGNOSIS — Z79899 Other long term (current) drug therapy: Secondary | ICD-10-CM | POA: Diagnosis not present

## 2015-09-21 DIAGNOSIS — Z87891 Personal history of nicotine dependence: Secondary | ICD-10-CM | POA: Diagnosis not present

## 2015-09-21 DIAGNOSIS — S83252A Bucket-handle tear of lateral meniscus, current injury, left knee, initial encounter: Secondary | ICD-10-CM | POA: Diagnosis not present

## 2015-09-21 DIAGNOSIS — E785 Hyperlipidemia, unspecified: Secondary | ICD-10-CM | POA: Insufficient documentation

## 2015-09-21 DIAGNOSIS — S83282A Other tear of lateral meniscus, current injury, left knee, initial encounter: Secondary | ICD-10-CM | POA: Diagnosis not present

## 2015-09-21 DIAGNOSIS — Z90710 Acquired absence of both cervix and uterus: Secondary | ICD-10-CM | POA: Diagnosis not present

## 2015-09-21 DIAGNOSIS — Z9221 Personal history of antineoplastic chemotherapy: Secondary | ICD-10-CM | POA: Diagnosis not present

## 2015-09-21 DIAGNOSIS — M159 Polyosteoarthritis, unspecified: Secondary | ICD-10-CM | POA: Diagnosis not present

## 2015-09-21 DIAGNOSIS — M2242 Chondromalacia patellae, left knee: Secondary | ICD-10-CM | POA: Insufficient documentation

## 2015-09-21 DIAGNOSIS — X58XXXA Exposure to other specified factors, initial encounter: Secondary | ICD-10-CM | POA: Insufficient documentation

## 2015-09-21 DIAGNOSIS — M23352 Other meniscus derangements, posterior horn of lateral meniscus, left knee: Secondary | ICD-10-CM | POA: Diagnosis not present

## 2015-09-21 DIAGNOSIS — S83289A Other tear of lateral meniscus, current injury, unspecified knee, initial encounter: Secondary | ICD-10-CM | POA: Diagnosis present

## 2015-09-21 HISTORY — PX: KNEE ARTHROSCOPY: SHX127

## 2015-09-21 SURGERY — ARTHROSCOPY, KNEE
Anesthesia: General | Site: Knee | Laterality: Left

## 2015-09-21 MED ORDER — MEPERIDINE HCL 50 MG/ML IJ SOLN: 6.2500 mg | INTRAMUSCULAR | Status: DC | PRN

## 2015-09-21 MED ORDER — PROPOFOL 10 MG/ML IV BOLUS
INTRAVENOUS | Status: AC
  Filled 2015-09-21: qty 20

## 2015-09-21 MED ORDER — MIDAZOLAM HCL 2 MG/2ML IJ SOLN
INTRAMUSCULAR | Status: AC
  Filled 2015-09-21: qty 2

## 2015-09-21 MED ORDER — EPHEDRINE SULFATE 50 MG/ML IJ SOLN
INTRAMUSCULAR | Status: DC | PRN
  Administered 2015-09-21: 5 mg via INTRAVENOUS

## 2015-09-21 MED ORDER — LIDOCAINE HCL (CARDIAC) 20 MG/ML IV SOLN
INTRAVENOUS | Status: DC | PRN
  Administered 2015-09-21: 100 mg via INTRAVENOUS

## 2015-09-21 MED ORDER — ACETAMINOPHEN 10 MG/ML IV SOLN
INTRAVENOUS | Status: AC
  Filled 2015-09-21: qty 100

## 2015-09-21 MED ORDER — DEXAMETHASONE SODIUM PHOSPHATE 10 MG/ML IJ SOLN
INTRAMUSCULAR | Status: DC | PRN
  Administered 2015-09-21: 10 mg via INTRAVENOUS

## 2015-09-21 MED ORDER — LACTATED RINGERS IV SOLN
INTRAVENOUS | Status: DC
  Administered 2015-09-21: 1000 mL via INTRAVENOUS
  Administered 2015-09-21: 17:00:00 via INTRAVENOUS

## 2015-09-21 MED ORDER — DEXAMETHASONE SODIUM PHOSPHATE 10 MG/ML IJ SOLN: 10.0000 mg | Freq: Once | INTRAMUSCULAR | Status: DC

## 2015-09-21 MED ORDER — FENTANYL CITRATE (PF) 250 MCG/5ML IJ SOLN
INTRAMUSCULAR | Status: AC
  Filled 2015-09-21: qty 5

## 2015-09-21 MED ORDER — MIDAZOLAM HCL 5 MG/5ML IJ SOLN
INTRAMUSCULAR | Status: DC | PRN
  Administered 2015-09-21: 2 mg via INTRAVENOUS

## 2015-09-21 MED ORDER — 0.9 % SODIUM CHLORIDE (POUR BTL) OPTIME
TOPICAL | Status: DC | PRN
  Administered 2015-09-21: 1000 mL

## 2015-09-21 MED ORDER — CEFAZOLIN SODIUM-DEXTROSE 2-3 GM-% IV SOLR
INTRAVENOUS | Status: AC
  Filled 2015-09-21: qty 50

## 2015-09-21 MED ORDER — DEXAMETHASONE SODIUM PHOSPHATE 10 MG/ML IJ SOLN
INTRAMUSCULAR | Status: AC
  Filled 2015-09-21: qty 1

## 2015-09-21 MED ORDER — CEFAZOLIN SODIUM-DEXTROSE 2-3 GM-% IV SOLR
2.0000 g | INTRAVENOUS | Status: AC
Start: 2015-09-22 — End: 2015-09-21
  Administered 2015-09-21: 2 g via INTRAVENOUS

## 2015-09-21 MED ORDER — BUPIVACAINE-EPINEPHRINE (PF) 0.25% -1:200000 IJ SOLN
INTRAMUSCULAR | Status: AC
  Filled 2015-09-21: qty 30

## 2015-09-21 MED ORDER — FENTANYL CITRATE (PF) 100 MCG/2ML IJ SOLN
INTRAMUSCULAR | Status: DC | PRN
  Administered 2015-09-21 (×2): 25 ug via INTRAVENOUS
  Administered 2015-09-21 (×2): 50 ug via INTRAVENOUS

## 2015-09-21 MED ORDER — HYDROMORPHONE HCL 1 MG/ML IJ SOLN
INTRAMUSCULAR | Status: AC
  Filled 2015-09-21: qty 1

## 2015-09-21 MED ORDER — ACETAMINOPHEN 10 MG/ML IV SOLN
1000.0000 mg | Freq: Once | INTRAVENOUS | Status: AC
  Administered 2015-09-21: 1000 mg via INTRAVENOUS

## 2015-09-21 MED ORDER — METHOCARBAMOL 1000 MG/10ML IJ SOLN
500.0000 mg | Freq: Once | INTRAVENOUS | Status: AC
  Administered 2015-09-21: 500 mg via INTRAVENOUS
  Filled 2015-09-21: qty 5

## 2015-09-21 MED ORDER — LACTATED RINGERS IR SOLN
Status: DC | PRN
  Administered 2015-09-21: 9000 mL

## 2015-09-21 MED ORDER — OXYCODONE HCL 5 MG PO TABS: 5.0000 mg | ORAL_TABLET | ORAL | Status: DC | PRN

## 2015-09-21 MED ORDER — LACTATED RINGERS IV SOLN: INTRAVENOUS | Status: DC

## 2015-09-21 MED ORDER — METHOCARBAMOL 500 MG PO TABS: 500.0000 mg | ORAL_TABLET | Freq: Four times a day (QID) | ORAL | Status: DC

## 2015-09-21 MED ORDER — PHENYLEPHRINE 40 MCG/ML (10ML) SYRINGE FOR IV PUSH (FOR BLOOD PRESSURE SUPPORT)
PREFILLED_SYRINGE | INTRAVENOUS | Status: AC
  Filled 2015-09-21: qty 10

## 2015-09-21 MED ORDER — SODIUM CHLORIDE 0.9 % IV SOLN: INTRAVENOUS | Status: DC

## 2015-09-21 MED ORDER — PROMETHAZINE HCL 25 MG/ML IJ SOLN: 6.2500 mg | INTRAMUSCULAR | Status: DC | PRN

## 2015-09-21 MED ORDER — BUPIVACAINE-EPINEPHRINE 0.25% -1:200000 IJ SOLN
INTRAMUSCULAR | Status: DC | PRN
Start: 2015-09-21 — End: 2015-09-21
  Administered 2015-09-21: 20 mL

## 2015-09-21 MED ORDER — HYDROMORPHONE HCL 1 MG/ML IJ SOLN
0.2500 mg | INTRAMUSCULAR | Status: DC | PRN
  Administered 2015-09-21 (×2): 0.5 mg via INTRAVENOUS

## 2015-09-21 MED ORDER — PROPOFOL 10 MG/ML IV BOLUS
INTRAVENOUS | Status: DC | PRN
  Administered 2015-09-21: 200 mg via INTRAVENOUS

## 2015-09-21 MED ORDER — OXYCODONE HCL 5 MG PO TABS
5.0000 mg | ORAL_TABLET | Freq: Once | ORAL | Status: AC
  Administered 2015-09-21: 5 mg via ORAL
  Filled 2015-09-21: qty 1

## 2015-09-21 MED ORDER — ONDANSETRON HCL 4 MG/2ML IJ SOLN
INTRAMUSCULAR | Status: AC
  Filled 2015-09-21: qty 2

## 2015-09-21 MED ORDER — LIDOCAINE HCL (CARDIAC) 20 MG/ML IV SOLN
INTRAVENOUS | Status: AC
  Filled 2015-09-21: qty 5

## 2015-09-21 MED ORDER — PHENYLEPHRINE HCL 10 MG/ML IJ SOLN
INTRAMUSCULAR | Status: DC | PRN
  Administered 2015-09-21: 40 ug via INTRAVENOUS

## 2015-09-21 MED ORDER — CHLORHEXIDINE GLUCONATE 4 % EX LIQD: 60.0000 mL | Freq: Once | CUTANEOUS | Status: DC

## 2015-09-21 MED FILL — METHOCARBAMOL 500 MG TABLET: 500 | 7 days supply | Qty: 30 | Fill #0

## 2015-09-21 MED FILL — oxyCODONE HCL 5 MG TABS: 5 | 4 days supply | Qty: 40 | Fill #0

## 2015-09-21 SURGICAL SUPPLY — 29 items
BANDAGE ACE 6X5 VEL STRL LF (GAUZE/BANDAGES/DRESSINGS) ×3 IMPLANT
BLADE 4.2CUDA (BLADE) ×3 IMPLANT
COVER SURGICAL LIGHT HANDLE (MISCELLANEOUS) ×3 IMPLANT
CUFF TOURN SGL QUICK 34 (TOURNIQUET CUFF) ×3
CUFF TRNQT CYL 34X4X40X1 (TOURNIQUET CUFF) ×1 IMPLANT
DRAPE U-SHAPE 47X51 STRL (DRAPES) ×3 IMPLANT
DRSG EMULSION OIL 3X3 NADH (GAUZE/BANDAGES/DRESSINGS) ×3 IMPLANT
DRSG PAD ABDOMINAL 8X10 ST (GAUZE/BANDAGES/DRESSINGS) ×3 IMPLANT
DURAPREP 26ML APPLICATOR (WOUND CARE) ×3 IMPLANT
GAUZE SPONGE 4X4 12PLY STRL (GAUZE/BANDAGES/DRESSINGS) ×3 IMPLANT
GLOVE BIO SURGEON STRL SZ8 (GLOVE) ×3 IMPLANT
GLOVE BIOGEL PI IND STRL 8 (GLOVE) ×1 IMPLANT
GLOVE BIOGEL PI INDICATOR 8 (GLOVE) ×2
GOWN STRL REUS W/TWL LRG LVL3 (GOWN DISPOSABLE) ×3 IMPLANT
KIT BASIN OR (CUSTOM PROCEDURE TRAY) ×3 IMPLANT
MANIFOLD NEPTUNE II (INSTRUMENTS) ×3 IMPLANT
MARKER SKIN DUAL TIP RULER LAB (MISCELLANEOUS) ×3 IMPLANT
PACK ARTHROSCOPY WL (CUSTOM PROCEDURE TRAY) ×3 IMPLANT
PACK ICE MAXI GEL EZY WRAP (MISCELLANEOUS) ×9 IMPLANT
PAD ABD 8X10 STRL (GAUZE/BANDAGES/DRESSINGS) ×2 IMPLANT
PADDING CAST ABS 6INX4YD NS (CAST SUPPLIES) ×2
PADDING CAST ABS COTTON 6X4 NS (CAST SUPPLIES) IMPLANT
PADDING CAST COTTON 6X4 STRL (CAST SUPPLIES) ×4 IMPLANT
POSITIONER SURGICAL ARM (MISCELLANEOUS) ×3 IMPLANT
SUT ETHILON 4 0 PS 2 18 (SUTURE) ×3 IMPLANT
TOWEL OR 17X26 10 PK STRL BLUE (TOWEL DISPOSABLE) ×3 IMPLANT
TUBING ARTHRO INFLOW-ONLY STRL (TUBING) ×3 IMPLANT
WAND HAND CNTRL MULTIVAC 90 (MISCELLANEOUS) ×2 IMPLANT
WRAP KNEE MAXI GEL POST OP (GAUZE/BANDAGES/DRESSINGS) ×3 IMPLANT

## 2015-09-21 NOTE — H&P (Signed)
  CC- Patricia Collier is a 70 y.o. female who presents with left knee pain.  HPI- . Knee Pain: Patient presents with knee pain involving the  left knee. Onset of the symptoms was several months ago. Inciting event: none known. Current symptoms include giving out, pain located laterally, stiffness and swelling. Pain is aggravated by going up and down stairs, lateral movements, pivoting, rising after sitting and walking.  Patient has had no prior knee problems. Evaluation to date: MRI: abnormal lateral meniscal tear. Treatment to date: corticosteroid injection which was not very effective and rest.  Past Medical History  Diagnosis Date  . Hyperlipidemia   . SVT (supraventricular tachycardia) (HCC)     x1 episode -3 yrs ago- no issues since.All heart testing negative. No further cardiac visits needed.  . Arthritis     ostearthritis -joints, greater left knee.  Marland Kitchen Neuromuscular disorder ([place])     neuropathy due to chemotherapy  . Breast cancer [facility])     surgery, radiation, chemotherapy.-no further oncology visits.  . Foot joint disorder     pain greater left foot ? related to retained hardware.    Past Surgical History  Procedure Laterality Date  . Nasal sinus surgery  1979  . Total abdominal hysterectomy  1984  . Cardiac catheterization    . Breast lumpectomy  2009    sentinel node dissection  . Bunionectomy      past has appointment to have foot check on -Pain presently more in left foot, ? related to retained hardware"  . Diagnostic laparoscopy      multiple for adhesions  . Cholecystectomy      laparscopic  . Transanal rectopexy      Prior to Admission medications   Medication Sig Start Date End Date Taking? Authorizing Provider  phentermine (ADIPEX-P) 37.5 MG tablet Take 37.5 mg by mouth every other day. Hot flashes 08/08/15  Yes Historical Provider, MD  promethazine-dextromethorphan (PROMETHAZINE-DM) 6.25-15 MG/5ML syrup Take 5 mLs by mouth 4 (four) times daily as  needed. Patient not taking: Reported on 09/07/2015 08/11/15   [name], MD   KNEE EXAM antalgic gait, soft tissue tenderness over lateral joint line, no effusion, negative pivot-shift, collateral ligaments intact  Physical Examination: General appearance - alert, well appearing, and in no distress Mental status - alert, oriented to person, place, and time Chest - clear to auscultation, no wheezes, rales or rhonchi, symmetric air entry Heart - normal rate, regular rhythm, normal S1, S2, no murmurs, rubs, clicks or gallops Abdomen - soft, nontender, nondistended, no masses or organomegaly Neurological - alert, oriented, normal speech, no focal findings or movement disorder noted    Asessment/Plan--- Left knee lateral meniscal tear- - Plan left knee arthroscopy with meniscal debridement. Procedure risks and potential comps discussed with patient who elects to proceed. Goals are decreased pain and increased function with a high likelihood of achieving both

## 2015-09-21 NOTE — Op Note (Signed)
Preoperative diagnosis-  Left knee lateral meniscal tear  Postoperative diagnosis Left- knee lateral meniscal tear  Procedure- Left knee arthroscopy with lateral  meniscal debridement    Surgeon- Dione Plover. Ashelyn Mccravy, MD  Anesthesia-General  EBL-  Minimal  Complications- None  Condition- PACU - hemodynamically stable.  Brief clinical note- -Patricia Collier is a 70 y.o.  female with a several month history of left knee pain and mechanical symptoms. Exam and history suggested lateral meniscal tear confirmed by MRI. The patient presents now for arthroscopy and debridement  Procedure in detail -       After successful administration of General anesthetic, a tourmiquet is placed high on the Left  thigh and the Left lower extremity is prepped and draped in the usual sterile fashion. Time out is performed by the surgical team. Standard superomedial and inferolateral portal sites are marked and incisions made with an 11 blade. The inflow cannula is passed through the superomedial portal and camera through the inferolateral portal and inflow is initiated. Arthroscopic visualization proceeds.      The undersurface of the patella and trochlea are visualized and there is Grade II chondromalacia both surfaces but no unstable cartilage lesions.. The medial and lateral gutters are visualized and there are  no loose bodies. Flexion and valgus force is applied to the knee and the medial compartment is entered. A spinal needle is passed into the joint through the site marked for the inferomedial portal. A small incision is made and the dilator passed into the joint. The findings for the medial compartment are normal .     The intercondylar notch is visualized and the ACL appears normal. The lateral compartment is entered and the findings are unstable bucket handle tear of body of lateral meniscus entering posterior horn.There is also an area 1 x 1 cm of the posterolateral tibial plateau with Grade III and IV  changes . The tear is debrided to a stable base with baskets and a shaver and sealed off with the Arthrocare. It is probed and found to be stable.     The joint is again inspected and there are no other tears, defects or loose bodies identified. The arthroscopic equipment is then removed from the inferior portals which are closed with interrupted 4-0 nylon. 20 ml of .25% Marcaine with epinephrine are injected through the inflow cannula and the cannula is then removed and the portal closed with nylon. The incisions are cleaned and dried and a bulky sterile dressing is applied. The patient is then awakened and transported to recovery in stable condition.   09/21/2015, 4:05 PM

## 2015-09-21 NOTE — Anesthesia Preprocedure Evaluation (Addendum)
Anesthesia Evaluation  Patient identified by MRN, date of birth, ID band Patient awake    Reviewed: Allergy & Precautions, NPO status , Patient's Chart, lab work & pertinent test results  Airway Mallampati: II  TM Distance: >3 FB Neck ROM: Full    Dental no notable dental hx.    Pulmonary neg pulmonary ROS, shortness of breath, former smoker,    Pulmonary exam normal breath sounds clear to auscultation       Cardiovascular negative cardio ROS Normal cardiovascular exam Rhythm:Regular Rate:Normal     Neuro/Psych negative neurological ROS  negative psych ROS   GI/Hepatic negative GI ROS, Neg liver ROS,   Endo/Other  negative endocrine ROS  Renal/GU negative Renal ROS     Musculoskeletal  (+) Arthritis ,   Abdominal   Peds  Hematology negative hematology ROS (+)   Anesthesia Other Findings   Reproductive/Obstetrics negative OB ROS                          Anesthesia Physical Anesthesia Plan  ASA: II  Anesthesia Plan: General   Post-op Pain Management:    Induction: Intravenous  Airway Management Planned: LMA  Additional Equipment:   Intra-op Plan:   Post-operative Plan: Extubation in OR  Informed Consent: I have reviewed the patients History and Physical, chart, labs and discussed the procedure including the risks, benefits and alternatives for the proposed anesthesia with the patient or authorized representative who has indicated his/her understanding and acceptance.   Dental advisory given  Plan Discussed with: CRNA  Anesthesia Plan Comments:        Anesthesia Quick Evaluation

## 2015-09-21 NOTE — Discharge Instructions (Signed)
° °Dr. Draeden Kellman °Total Joint Specialist °[facility] °[address] °[place], [place] [address] °[phone] ° ° °Arthroscopic Procedure, Knee °An arthroscopic procedure can find what is wrong with your knee. °PROCEDURE °Arthroscopy is a surgical technique that allows your orthopedic surgeon to diagnose and treat your knee injury with accuracy. They will look into your knee through a small instrument. This is almost like a small (pencil sized) telescope. Because arthroscopy affects your knee less than open knee surgery, you can anticipate a more rapid recovery. Taking an active role by following your caregiver's instructions will help with rapid and complete recovery. Use crutches, rest, elevation, ice, and knee exercises as instructed. The length of recovery depends on various factors including type of injury, age, physical condition, medical conditions, and your rehabilitation. °Your knee is the joint between the large bones (femur and tibia) in your leg. Cartilage covers these bone ends which are smooth and slippery and allow your knee to bend and move smoothly. Two menisci, thick, semi-lunar shaped pads of cartilage which form a rim inside the joint, help absorb shock and stabilize your knee. Ligaments bind the bones together and support your knee joint. Muscles move the joint, help support your knee, and take stress off the joint itself. Because of this all programs and physical therapy to rehabilitate an injured or repaired knee require rebuilding and strengthening your muscles. °AFTER THE PROCEDURE °· After the procedure, you will be moved to a recovery area until most of the effects of the medication have worn off. Your caregiver will discuss the test results with you.  °· Only take over-the-counter or prescription medicines for pain, discomfort, or fever as directed by your caregiver.  °SEEK MEDICAL CARE IF:  °· You have increased bleeding from your wounds.  °· You see  redness, swelling, or have increasing pain in your wounds.  °· You have pus coming from your wound.  °· You have an oral temperature above 102° F (38.9° C).  °· You notice a bad smell coming from the wound or dressing.  °· You have severe pain with any motion of your knee.  °SEEK IMMEDIATE MEDICAL CARE IF:  °· You develop a rash.  °· You have difficulty breathing.  °· You have any allergic problems.  °FURTHER INSTRUCTIONS:  °· ICE to the affected knee every three hours for 30 minutes at a time and then as needed for pain and swelling.  Continue to use ice on the knee for pain and swelling from surgery. You may notice swelling that will progress down to the foot and ankle.  This is normal after surgery.  Elevate the leg when you are not up walking on it.   ° °DIET °You may resume your previous home diet once your are discharged from the hospital. ° °DRESSING / WOUND CARE / SHOWERING °You may start showering two days after being discharged home but do not submerge the incisions under water.  °Change dressing 48 hours after the procedure and then cover the small incisions with band aids until your follow up visit. °Change the surgical dressings daily and reapply a dry dressing each time.  ° °ACTIVITY °Walk with your walker as instructed. °Use walker as long as suggested by your caregivers. °Avoid periods of inactivity such as sitting longer than an hour when not asleep. This helps prevent blood clots.  °You may resume a sexual relationship in one month or when given the OK by your doctor.  °You may return to   work once you are cleared by your doctor.  °Do not drive a car for 6 weeks or until released by you surgeon.  °Do not drive while taking narcotics. ° °WEIGHT BEARING AS TOLERATED ° °POSTOPERATIVE CONSTIPATION PROTOCOL °Constipation - defined medically as fewer than three stools per week and severe constipation as less than one stool per week. ° °One of the most common issues patients have following surgery is  constipation.  Even if you have a regular bowel pattern at home, your normal regimen is likely to be disrupted due to multiple reasons following surgery.  Combination of anesthesia, postoperative narcotics, change in appetite and fluid intake all can affect your bowels.  In order to avoid complications following surgery, here are some recommendations in order to help you during your recovery period. ° °Colace (docusate) - Pick up an over-the-counter form of Colace or another stool softener and take twice a day as long as you are requiring postoperative pain medications.  Take with a full glass of water daily.  If you experience loose stools or diarrhea, hold the colace until you stool forms back up.  If your symptoms do not get better within 1 week or if they get worse, check with your doctor. ° °Dulcolax (bisacodyl) - Pick up over-the-counter and take as directed by the product packaging as needed to assist with the movement of your bowels.  Take with a full glass of water.  Use this product as needed if not relieved by Colace only.  ° °MiraLax (polyethylene glycol) - Pick up over-the-counter to have on hand.  MiraLax is a solution that will increase the amount of water in your bowels to assist with bowel movements.  Take as directed and can mix with a glass of water, juice, soda, coffee, or tea.  Take if you go more than two days without a movement. °Do not use MiraLax more than once per day. Call your doctor if you are still constipated or irregular after using this medication for 7 days in a row. ° °If you continue to have problems with postoperative constipation, please contact the office for further assistance and recommendations.  If you experience "the worst abdominal pain ever" or develop nausea or vomiting, please contact the office immediatly for further recommendations for treatment. ° °ITCHING ° If you experience itching with your medications, try taking only a single pain pill, or even half a pain pill  at a time.  You can also use Benadryl over the counter for itching or also to help with sleep.  ° °TED HOSE STOCKINGS °Wear the elastic stockings on both legs for three weeks following surgery during the day but you may remove then at night for sleeping. ° °MEDICATIONS °See your medication summary on the “After Visit Summary” that the nursing staff will review with you prior to discharge.  You may have some home medications which will be placed on hold until you complete the course of blood thinner medication.  It is important for you to complete the blood thinner medication as prescribed by your surgeon.  Continue your approved medications as instructed at time of discharge. °Do not drive while taking narcotics.  ° °PRECAUTIONS °If you experience chest pain or shortness of breath - call 911 immediately for transfer to the hospital emergency department.  °If you develop a fever greater that 101 F, purulent drainage from wound, increased redness or drainage from wound, foul odor from the wound/dressing, or calf pain - CONTACT YOUR SURGEON.   °                                                °  FOLLOW-UP APPOINTMENTS °Make sure you keep all of your appointments after your operation with your surgeon and caregivers. You should call the office at [phone]  and make an appointment for approximately one week after the date of your surgery or on the date instructed by your surgeon outlined in the "After Visit Summary". ° °RANGE OF MOTION AND STRENGTHENING EXERCISES  °Rehabilitation of the knee is important following a knee injury or an operation. After just a few days of immobilization, the muscles of the thigh which control the knee become weakened and shrink (atrophy). Knee exercises are designed to build up the tone and strength of the thigh muscles and to improve knee motion. Often times heat used for twenty to thirty minutes before working out will loosen up your tissues and help with improving the range of motion  but do not use heat for the first two weeks following surgery. These exercises can be done on a training (exercise) mat, on the floor, on a table or on a bed. Use what ever works the best and is most comfortable for you Knee exercises include: ° °QUAD STRENGTHENING EXERCISES °Strengthening Quadriceps Sets ° °Tighten muscles on top of thigh by pushing knees down into floor or table. °Hold for 20 seconds. Repeat 10 times. °Do 2 sessions per day. ° ° ° ° °Strengthening Terminal Knee Extension ° °With knee bent over bolster, straighten knee by tightening muscle on top of thigh. Be sure to keep bottom of knee on bolster. °Hold for 20 seconds. Repeat 10 times. °Do 2 sessions per day. ° ° °Straight Leg with Bent Knee ° °Lie on back with opposite leg bent. Keep involved knee slightly bent at knee and raise leg 4-6". Hold for 10 seconds. °Repeat 20 times per set. °Do 2 sets per session. °Do 2 sessions per day. ° °

## 2015-09-21 NOTE — Anesthesia Procedure Notes (Signed)
Procedure Name: LMA Insertion Date/Time: 09/21/2015 3:21 PM Performed by: Carleene Cooper A Pre-anesthesia Checklist: Patient identified, Timeout performed, Emergency Drugs available, Suction available and Patient being monitored Patient Re-evaluated:Patient Re-evaluated prior to inductionOxygen Delivery Method: Circle system utilized Preoxygenation: Pre-oxygenation with 100% oxygen Intubation Type: IV induction Ventilation: Mask ventilation without difficulty LMA: LMA with gastric port inserted LMA Size: 4.0 Number of attempts: 1 Placement Confirmation: positive ETCO2 and breath sounds checked- equal and bilateral Tube secured with: Tape Dental Injury: Teeth and Oropharynx as per pre-operative assessment

## 2015-09-21 NOTE — Anesthesia Postprocedure Evaluation (Signed)
Anesthesia Post Note  Patient: Patricia Collier  Procedure(s) Performed: Procedure(s) (LRB): LEFT ARTHROSCOPY KNEE WITH LATERAL MENISCUS DEBRIDEMENT (Left)  Patient location during evaluation: PACU Anesthesia Type: General Level of consciousness: sedated and patient cooperative Pain management: pain level controlled Vital Signs Assessment: post-procedure vital signs reviewed and stable Respiratory status: spontaneous breathing Cardiovascular status: stable Anesthetic complications: no    Last Vitals:  Filed Vitals:   09/21/15 1700 09/21/15 1715  BP: 108/69 107/64  Pulse: 100 97  Temp: 36.3 C 36.6 C  Resp: 10 12    Last Pain:  Filed Vitals:   09/21/15 1727  PainSc: [place]

## 2015-09-21 NOTE — Transfer of Care (Signed)
Immediate Anesthesia Transfer of Care Note  Patient: Patricia Collier  Procedure(s) Performed: Procedure(s): LEFT ARTHROSCOPY KNEE WITH LATERAL MENISCUS DEBRIDEMENT (Left)  Patient Location: PACU  Anesthesia Type:General  Level of Consciousness: awake, alert , oriented and patient cooperative  Airway & Oxygen Therapy: Patient Spontanous Breathing and Patient connected to face mask oxygen  Post-op Assessment: Report given to RN, Post -op Vital signs reviewed and stable and Patient moving all extremities  Post vital signs: Reviewed and stable  Last Vitals:  Filed Vitals:   09/21/15 1332  BP: 118/73  Pulse: 93  Temp: 36.5 C  Resp: 18    Complications: No apparent anesthesia complications

## 2015-09-21 NOTE — Interval H&P Note (Signed)
History and Physical Interval Note:  09/21/2015 3:06 PM  Patricia Collier  has presented today for surgery, with the diagnosis of LEFT KNEE LATERAL MENISCUS TEAR  The various methods of treatment have been discussed with the patient and family. After consideration of risks, benefits and other options for treatment, the patient has consented to  Procedure(s): LEFT ARTHROSCOPY KNEE WITH DEBRIDEMENT (Left) as a surgical intervention .  The patient's history has been reviewed, patient examined, no change in status, stable for surgery.  I have reviewed the patient's chart and labs.  Questions were answered to the patient's satisfaction.     Gearlean Alf

## 2015-09-22 ENCOUNTER — Encounter (HOSPITAL_COMMUNITY): Payer: Self-pay | Admitting: Orthopedic Surgery

## 2015-09-22 MED FILL — traMADol HCL 50 MG TABS: 50 | 10 days supply | Qty: 80 | Fill #0

## 2015-09-22 NOTE — Telephone Encounter (Signed)
Nurse Gena states she is faxing forms confirming pt's DOS, procedure performed, next appt, return to work date, limitations, and Dr. Stephenie Acres recommended start date of disability.

## 2015-10-06 ENCOUNTER — Ambulatory Visit (INDEPENDENT_AMBULATORY_CARE_PROVIDER_SITE_OTHER): Payer: 59 | Admitting: Podiatry

## 2015-10-06 DIAGNOSIS — M21621 Bunionette of right foot: Secondary | ICD-10-CM | POA: Diagnosis not present

## 2015-10-06 DIAGNOSIS — M7751 Other enthesopathy of right foot: Secondary | ICD-10-CM | POA: Diagnosis not present

## 2015-10-06 DIAGNOSIS — M2040 Other hammer toe(s) (acquired), unspecified foot: Secondary | ICD-10-CM

## 2015-10-06 DIAGNOSIS — M779 Enthesopathy, unspecified: Secondary | ICD-10-CM

## 2015-10-06 DIAGNOSIS — M778 Other enthesopathies, not elsewhere classified: Secondary | ICD-10-CM

## 2015-10-08 NOTE — Progress Notes (Signed)
She presents today for further discussion regarding second metatarsal osteotomy right foot. She is also having pain about the fifth metatarsophalangeal joint of the right foot. She states that this is been bothering her for quite some time.  Objective: Vital signs are stable alert and oriented 3. Pulses are palpable. 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 Tailor's bunion deformity third right.  Assessment: Hammertoe deformity second. Second and fifth met pain.  Plan: We went over the consent form today and added hammertoe repair with pin second digit right as well as a tailor's bunionectomy possible fifth metatarsal osteotomy right.

## 2015-10-13 ENCOUNTER — Other Ambulatory Visit: Payer: Self-pay | Admitting: Podiatry

## 2015-10-13 MED ORDER — CEPHALEXIN 500 MG PO CAPS: 500.0000 mg | ORAL_CAPSULE | Freq: Three times a day (TID) | ORAL | Status: DC

## 2015-10-13 MED ORDER — ONDANSETRON HCL 4 MG PO TABS: 4.0000 mg | ORAL_TABLET | Freq: Three times a day (TID) | ORAL | Status: DC | PRN

## 2015-10-13 MED ORDER — OXYCODONE-ACETAMINOPHEN 10-325 MG PO TABS: 1.0000 | ORAL_TABLET | Freq: Four times a day (QID) | ORAL | Status: DC | PRN

## 2015-10-14 ENCOUNTER — Encounter: Payer: Self-pay | Admitting: Podiatry

## 2015-10-14 DIAGNOSIS — M25571 Pain in right ankle and joints of right foot: Secondary | ICD-10-CM | POA: Diagnosis not present

## 2015-10-14 DIAGNOSIS — M205X1 Other deformities of toe(s) (acquired), right foot: Secondary | ICD-10-CM | POA: Diagnosis not present

## 2015-10-14 DIAGNOSIS — E7800 Pure hypercholesterolemia, unspecified: Secondary | ICD-10-CM | POA: Diagnosis not present

## 2015-10-14 DIAGNOSIS — M2041 Other hammer toe(s) (acquired), right foot: Secondary | ICD-10-CM | POA: Diagnosis not present

## 2015-10-14 DIAGNOSIS — T8484XA Pain due to internal orthopedic prosthetic devices, implants and grafts, initial encounter: Secondary | ICD-10-CM | POA: Diagnosis not present

## 2015-10-14 DIAGNOSIS — M21621 Bunionette of right foot: Secondary | ICD-10-CM | POA: Diagnosis not present

## 2015-10-14 DIAGNOSIS — M21549 Acquired clubfoot, unspecified foot: Secondary | ICD-10-CM | POA: Diagnosis not present

## 2015-10-14 DIAGNOSIS — G8918 Other acute postprocedural pain: Secondary | ICD-10-CM | POA: Diagnosis not present

## 2015-10-14 DIAGNOSIS — Z4889 Encounter for other specified surgical aftercare: Secondary | ICD-10-CM | POA: Diagnosis not present

## 2015-10-14 DIAGNOSIS — M21541 Acquired clubfoot, right foot: Secondary | ICD-10-CM | POA: Diagnosis not present

## 2015-10-14 DIAGNOSIS — M2011 Hallux valgus (acquired), right foot: Secondary | ICD-10-CM | POA: Diagnosis not present

## 2015-10-14 MED FILL — CEPHALEXIN 500 MG CAPSULE: 500 | 10 days supply | Qty: 30 | Fill #0

## 2015-10-14 MED FILL — ONDANSETRON HCL 4 MG TABLET: 4 | 7 days supply | Qty: 20 | Fill #0

## 2015-10-14 MED FILL — OXYCODONE-APAP 10-325 TAB: 10-325 | 10 days supply | Qty: 40 | Fill #0

## 2015-10-17 ENCOUNTER — Telehealth: Payer: Self-pay | Admitting: *Deleted

## 2015-10-17 NOTE — Telephone Encounter (Addendum)
Patricia Collier states she need the confirmation for surgery, CPT and ICD.10 codes, for Claim# [id number], if we can't call in today, she will send a fax with the request.

## 2015-10-20 ENCOUNTER — Other Ambulatory Visit: Payer: Self-pay

## 2015-10-24 ENCOUNTER — Encounter (INDEPENDENT_AMBULATORY_CARE_PROVIDER_SITE_OTHER): Payer: Self-pay | Admitting: *Deleted

## 2015-10-24 DIAGNOSIS — M2040 Other hammer toe(s) (acquired), unspecified foot: Secondary | ICD-10-CM

## 2015-10-24 DIAGNOSIS — M7751 Other enthesopathy of right foot: Secondary | ICD-10-CM

## 2015-10-24 DIAGNOSIS — M21621 Bunionette of right foot: Secondary | ICD-10-CM

## 2015-10-25 ENCOUNTER — Ambulatory Visit (INDEPENDENT_AMBULATORY_CARE_PROVIDER_SITE_OTHER): Payer: 59 | Admitting: Podiatry

## 2015-10-25 ENCOUNTER — Ambulatory Visit (INDEPENDENT_AMBULATORY_CARE_PROVIDER_SITE_OTHER): Payer: 59

## 2015-10-25 ENCOUNTER — Encounter: Payer: Self-pay | Admitting: Podiatry

## 2015-10-25 VITALS — BP 115/81 | HR 99 | Resp 16

## 2015-10-25 DIAGNOSIS — M21621 Bunionette of right foot: Secondary | ICD-10-CM

## 2015-10-25 DIAGNOSIS — M2041 Other hammer toe(s) (acquired), right foot: Secondary | ICD-10-CM

## 2015-10-25 DIAGNOSIS — Z9889 Other specified postprocedural states: Secondary | ICD-10-CM

## 2015-10-25 NOTE — Progress Notes (Signed)
She presents today for her first postop visit status post second metatarsal osteotomy and resection tailor's bunion deformity right foot. She states that her block lasted 40 hours. She denies fever chills nausea vomiting states that she has had no pain.  Objective: Vital signs stable alert and oriented 3. Pulses are palpable. Dry sterile dressing was removed demonstrates no erythema edema saline as drainage or odor pain. Incision sites are going on to heal uneventfully. Radiographs confirm good placement of the second metatarsal osteotomy.  Assessment: Well-healing surgical foot right  Plan: Redress her today dry sterile compressive dressing encouraged her to keep this dry and elevated. Also place her in a Darco shoe. Follow up with her in 1 week for suture removal and compression anklet application.

## 2015-10-26 ENCOUNTER — Other Ambulatory Visit: Payer: Self-pay | Admitting: Family Medicine

## 2015-10-26 DIAGNOSIS — Z853 Personal history of malignant neoplasm of breast: Secondary | ICD-10-CM

## 2015-10-26 DIAGNOSIS — Z9889 Other specified postprocedural states: Secondary | ICD-10-CM

## 2015-10-27 NOTE — Progress Notes (Signed)
DOS 10/14/2015 Second metatarsal osteotomy with possible pinning of toe right foot, hammer toe repair 2nd right, Tailor's bunionectomy with removal screws 5th right metatarsal.

## 2015-10-31 DIAGNOSIS — M2041 Other hammer toe(s) (acquired), right foot: Secondary | ICD-10-CM

## 2015-11-01 ENCOUNTER — Encounter (INDEPENDENT_AMBULATORY_CARE_PROVIDER_SITE_OTHER): Payer: 59 | Admitting: Podiatry

## 2015-11-01 DIAGNOSIS — M2041 Other hammer toe(s) (acquired), right foot: Secondary | ICD-10-CM

## 2015-11-01 DIAGNOSIS — M21621 Bunionette of right foot: Secondary | ICD-10-CM

## 2015-11-01 DIAGNOSIS — Z9889 Other specified postprocedural states: Secondary | ICD-10-CM

## 2015-11-03 ENCOUNTER — Ambulatory Visit (INDEPENDENT_AMBULATORY_CARE_PROVIDER_SITE_OTHER): Payer: 59 | Admitting: Podiatry

## 2015-11-03 ENCOUNTER — Encounter: Payer: Self-pay | Admitting: Podiatry

## 2015-11-03 VITALS — BP 108/70 | HR 104 | Resp 16

## 2015-11-03 DIAGNOSIS — M21621 Bunionette of right foot: Secondary | ICD-10-CM

## 2015-11-03 DIAGNOSIS — M2041 Other hammer toe(s) (acquired), right foot: Secondary | ICD-10-CM

## 2015-11-03 DIAGNOSIS — Z9889 Other specified postprocedural states: Secondary | ICD-10-CM

## 2015-11-03 NOTE — Progress Notes (Signed)
This encounter was created in error - please disregard.

## 2015-11-03 NOTE — Progress Notes (Signed)
She presents today for a two-week follow-up status post fifth metatarsal osteotomy second metatarsal osteotomy right foot. Date of surgery 10/14/2015. She states it is doing just fine. She states that she has to go back to work by May 21.  Objective: Vital signs are stable she is alert and oriented 3. Pulses are strongly palpable sutures are intact sutures were removed today margins remain well coapted minimal edema and no erythema cellulitis drainage or odor mild tenderness on palpation of the fifth metatarsophalangeal joint area.  Assessment: Well-healing surgical foot right.  Plan: Sutures were removed today placed her in a digital Darco splint and a compression anklet encouraged her to wear a Darco shoe which was dispensed today and I will follow-up with her in 2 weeks for reevaluation. Radiographs will be taken at that time.

## 2015-11-17 ENCOUNTER — Ambulatory Visit: Payer: 59 | Admitting: Podiatry

## 2015-11-23 ENCOUNTER — Ambulatory Visit
Admission: RE | Admit: 2015-11-23 | Discharge: 2015-11-23 | Disposition: A | Discharge status: Home / Self Care | Payer: 59 | Source: Ambulatory Visit | Attending: Family Medicine | Admitting: Family Medicine

## 2015-11-23 DIAGNOSIS — Z853 Personal history of malignant neoplasm of breast: Secondary | ICD-10-CM

## 2015-11-23 DIAGNOSIS — Z9889 Other specified postprocedural states: Secondary | ICD-10-CM

## 2015-11-23 DIAGNOSIS — R928 Other abnormal and inconclusive findings on diagnostic imaging of breast: Secondary | ICD-10-CM | POA: Diagnosis not present

## 2015-11-24 ENCOUNTER — Encounter: Payer: Self-pay | Admitting: Podiatry

## 2015-11-24 ENCOUNTER — Ambulatory Visit (INDEPENDENT_AMBULATORY_CARE_PROVIDER_SITE_OTHER): Payer: 59

## 2015-11-24 ENCOUNTER — Ambulatory Visit (INDEPENDENT_AMBULATORY_CARE_PROVIDER_SITE_OTHER): Payer: 59 | Admitting: Podiatry

## 2015-11-24 VITALS — BP 116/79 | HR 100 | Resp 12

## 2015-11-24 DIAGNOSIS — M2041 Other hammer toe(s) (acquired), right foot: Secondary | ICD-10-CM

## 2015-11-24 NOTE — Progress Notes (Signed)
She presents today for follow-up of a second metatarsal osteotomy right foot. She states that the foot part is healing well however her second toe still stands up and with standing up it is still painful. She is disappointed that the toe remains cocked up with ambulation but lays flat when she is not weightbearing.  Objective: Vital signs are stable she is alert and oriented 3. Pulses are palpable area she still has flexor contracture deformity at the PIPJ second digit right foot. However the surgical site has gone on to heal uneventfully but these are confirmed with radiographs taken today.  Assessment: Well-healing surgical foot right. Hammertoe deformity still present but flexible right.  Plan: We did discuss the need for fusion of the PIPJ also discussed the possibility of a tenotomy flexor and extensor. I will follow up with her in fall for possible arthrodesis.

## 2015-12-12 DIAGNOSIS — E6609 Other obesity due to excess calories: Secondary | ICD-10-CM | POA: Diagnosis not present

## 2015-12-12 DIAGNOSIS — N951 Menopausal and female climacteric states: Secondary | ICD-10-CM | POA: Diagnosis not present

## 2015-12-12 DIAGNOSIS — Z6824 Body mass index (BMI) 24.0-24.9, adult: Secondary | ICD-10-CM | POA: Diagnosis not present

## 2015-12-12 MED FILL — PHENTERMINE 37.5 MG TABLET: 37.5 | 30 days supply | Qty: 30 | Fill #0

## 2015-12-12 MED FILL — TRANSDERM-SCOP 1.5 MG/72HR: 1 | 6 days supply | Qty: 6 | Fill #0

## 2015-12-15 MED FILL — ONDANSETRON ODT 8 MG TABLET: 8 | 4 days supply | Qty: 15 | Fill #0

## 2015-12-30 ENCOUNTER — Encounter: Payer: Self-pay | Admitting: Genetic Counselor

## 2016-01-23 MED FILL — ONDANSETRON ODT 8 MG TABLET: 8 | 4 days supply | Qty: 15 | Fill #1

## 2016-01-23 MED FILL — PHENTERMINE 37.5 MG TABLET: 37.5 | 30 days supply | Qty: 30 | Fill #1

## 2016-03-16 MED FILL — PHENTERMINE 37.5 MG TABLET: 37.5 | 30 days supply | Qty: 30 | Fill #2

## 2016-04-24 ENCOUNTER — Ambulatory Visit (INDEPENDENT_AMBULATORY_CARE_PROVIDER_SITE_OTHER): Payer: 59

## 2016-04-24 ENCOUNTER — Encounter: Payer: Self-pay | Admitting: Podiatry

## 2016-04-24 ENCOUNTER — Ambulatory Visit (INDEPENDENT_AMBULATORY_CARE_PROVIDER_SITE_OTHER): Payer: 59 | Admitting: Podiatry

## 2016-04-24 DIAGNOSIS — M7751 Other enthesopathy of right foot: Secondary | ICD-10-CM | POA: Diagnosis not present

## 2016-04-24 DIAGNOSIS — M779 Enthesopathy, unspecified: Principal | ICD-10-CM

## 2016-04-24 DIAGNOSIS — M778 Other enthesopathies, not elsewhere classified: Secondary | ICD-10-CM

## 2016-04-24 NOTE — Progress Notes (Signed)
She presents today complaining of pain to the second digit of the right foot. States that the toe sits up and we should've fixated the time of surgery. She states is becoming more painful as time goes on like to have this surgically corrected. She is also complaining of tenderness along the first metatarsal of the right foot where 2 K wires are retained from previous bunion surgery.  Objective: Vital signs stable and 3. Pulses are palpable. Contracted second metatarsophalangeal joint right foot with a rigid hammertoe deformity second digit PIPJ is present. Radiographs confirm painful second metatarsophalangeal joint and hammertoe deformity. Radiographs do demonstrate retention of 2 K wires with the medialmost K wire being prominent.  Assessment: Hammertoe deformity contracted second metatarsophalangeal joint after surgery. Painful internal fixation first metatarsal right foot.  Plan: We consented her today for a second metatarsophalangeal joint release of hammertoe deformity right foot. We also consented her for a removal internal fixation first metatarsal right foot. We discussed the etiology pathology inserted versus surgical therapies. We discussed the pros and cons of surgery. We discussed possible postop complications which may include but are not limited to postop pain bleeding swelling infection recurrence the further surgery overcorrection and under correction. I'll follow-up with her the time surgery in the near future. She is provided with all were necessary for the surgery center and anesthesia. She already has shoe gear for her postop recovery period.

## 2016-04-24 NOTE — Patient Instructions (Signed)

## 2016-05-01 DIAGNOSIS — Z6822 Body mass index (BMI) 22.0-22.9, adult: Secondary | ICD-10-CM | POA: Diagnosis not present

## 2016-05-01 DIAGNOSIS — Z01419 Encounter for gynecological examination (general) (routine) without abnormal findings: Secondary | ICD-10-CM | POA: Diagnosis not present

## 2016-05-01 DIAGNOSIS — Z1272 Encounter for screening for malignant neoplasm of vagina: Secondary | ICD-10-CM | POA: Diagnosis not present

## 2016-05-08 MED FILL — PHENTERMINE 37.5 MG TABLET: 37.5 | 90 days supply | Qty: 90 | Fill #0

## 2016-05-29 DIAGNOSIS — H25812 Combined forms of age-related cataract, left eye: Secondary | ICD-10-CM | POA: Diagnosis not present

## 2016-06-19 ENCOUNTER — Other Ambulatory Visit: Payer: Self-pay | Admitting: Podiatry

## 2016-06-19 MED ORDER — CEPHALEXIN 500 MG PO CAPS: 500.0000 mg | ORAL_CAPSULE | Freq: Three times a day (TID) | ORAL | 0 refills | Status: DC

## 2016-06-19 MED ORDER — ONDANSETRON HCL 4 MG PO TABS: 4.0000 mg | ORAL_TABLET | Freq: Three times a day (TID) | ORAL | 0 refills | Status: DC | PRN

## 2016-06-19 MED ORDER — HYDROMORPHONE HCL 4 MG PO TABS: 4.0000 mg | ORAL_TABLET | ORAL | 0 refills | Status: DC | PRN

## 2016-06-22 ENCOUNTER — Encounter: Payer: Self-pay | Admitting: Podiatry

## 2016-06-22 DIAGNOSIS — Z4889 Encounter for other specified surgical aftercare: Secondary | ICD-10-CM | POA: Diagnosis not present

## 2016-06-22 DIAGNOSIS — M25571 Pain in right ankle and joints of right foot: Secondary | ICD-10-CM | POA: Diagnosis not present

## 2016-06-22 DIAGNOSIS — I471 Supraventricular tachycardia: Secondary | ICD-10-CM | POA: Diagnosis not present

## 2016-06-22 DIAGNOSIS — M79671 Pain in right foot: Secondary | ICD-10-CM | POA: Diagnosis not present

## 2016-06-22 DIAGNOSIS — Y792 Prosthetic and other implants, materials and accessory orthopedic devices associated with adverse incidents: Secondary | ICD-10-CM | POA: Diagnosis not present

## 2016-06-22 DIAGNOSIS — T8484XA Pain due to internal orthopedic prosthetic devices, implants and grafts, initial encounter: Secondary | ICD-10-CM | POA: Diagnosis not present

## 2016-06-22 DIAGNOSIS — M2041 Other hammer toe(s) (acquired), right foot: Secondary | ICD-10-CM | POA: Diagnosis not present

## 2016-06-22 DIAGNOSIS — M21541 Acquired clubfoot, right foot: Secondary | ICD-10-CM | POA: Diagnosis not present

## 2016-06-22 MED FILL — HYDROmorphone HCL 4 MG TABS: 4 | 5 days supply | Qty: 30 | Fill #0

## 2016-06-22 MED FILL — ONDANSETRON HCL 4 MG TABLET: 4 | 7 days supply | Qty: 20 | Fill #0

## 2016-06-22 MED FILL — CEPHALEXIN 500 MG CAPSULE: 500 | 10 days supply | Qty: 30 | Fill #0

## 2016-06-25 ENCOUNTER — Telehealth: Payer: Self-pay

## 2016-06-25 NOTE — Telephone Encounter (Signed)
Spoke with pt regarding post op status. She states she is doing well, but having more pain in her toe than with her last surgery. She is managing pain well with medication when needed, ice and elevation. Advised on boot usage and s/s of infection. Any acute symptom changes should be reported immediatley

## 2016-06-27 NOTE — Progress Notes (Signed)
DOS 12.15.2017 2nd MTPj Release Right; Hammertoe Repair 2nd Toe with Pin and Screw Right; Removal Internal Fixation 1st Met Right

## 2016-06-28 ENCOUNTER — Encounter: Payer: Self-pay | Admitting: Podiatry

## 2016-06-28 ENCOUNTER — Ambulatory Visit (INDEPENDENT_AMBULATORY_CARE_PROVIDER_SITE_OTHER): Payer: Self-pay | Admitting: Podiatry

## 2016-06-28 ENCOUNTER — Ambulatory Visit (INDEPENDENT_AMBULATORY_CARE_PROVIDER_SITE_OTHER): Payer: 59

## 2016-06-28 VITALS — Temp 97.6°F

## 2016-06-28 DIAGNOSIS — M7751 Other enthesopathy of right foot: Secondary | ICD-10-CM

## 2016-06-28 DIAGNOSIS — M778 Other enthesopathies, not elsewhere classified: Secondary | ICD-10-CM

## 2016-06-28 DIAGNOSIS — M779 Enthesopathy, unspecified: Principal | ICD-10-CM

## 2016-06-28 DIAGNOSIS — M21621 Bunionette of right foot: Secondary | ICD-10-CM

## 2016-06-28 NOTE — Progress Notes (Signed)
She presents today for her first postop visit status post second metatarsal osteotomy and hammertoe repair screw fixation and pin fixation second toe right foot. She denies fever chills nausea vomiting states is a little bit groggy today.  Objective: Vital signs are stable she is alert and oriented 3. Pulses are intact neurologic sensorium is intact rest or dressings intact K wires intact to the tip of the second toe radiographs confirm K wires run through a screw in the toe. It is also extended into the head of the first metatarsal. Currently sutures are intact margins well coapted. No malodor no signs of infection.  Assessment: Well-healing surgical foot right.  Plan: Redress today dry sterile compressive dressing follow-up with her in 2 weeks for suture removal. She will continue the use of the Darco shoe for at least 5 weeks.

## 2016-07-04 MED FILL — ONDANSETRON ODT 8 MG TABLET: 8 | 4 days supply | Qty: 15 | Fill #2

## 2016-07-05 ENCOUNTER — Ambulatory Visit (INDEPENDENT_AMBULATORY_CARE_PROVIDER_SITE_OTHER): Payer: Self-pay | Admitting: Podiatry

## 2016-07-05 DIAGNOSIS — R52 Pain, unspecified: Secondary | ICD-10-CM

## 2016-07-05 DIAGNOSIS — M779 Enthesopathy, unspecified: Secondary | ICD-10-CM

## 2016-07-05 DIAGNOSIS — M7751 Other enthesopathy of right foot: Secondary | ICD-10-CM

## 2016-07-05 DIAGNOSIS — M21621 Bunionette of right foot: Secondary | ICD-10-CM

## 2016-07-05 DIAGNOSIS — M778 Other enthesopathies, not elsewhere classified: Secondary | ICD-10-CM

## 2016-07-05 NOTE — Progress Notes (Signed)
She presents today for follow-up of her surgical foot right. Date of surgery 06/22/2016 that was post removal of internal fixation first and second metatarsals hammertoe repair with screw and pin second digit right foot. She states that is still throbbing and swollen on the top relates that she's been up on it through the holidays.  Objective: Vital signs are stable she is alert and oriented 3. Pulses are palpable. Neurologic sensorium is intact. Deep tendon reflexes are intact. K wire is intact to the second toe and in good position. She is slightly elevated at the level of the second toe more than likely associated with bending. To some degree. At this point I think we will leave the sutures in just for another week  Assessment: Well-healing surgical foot  Plan: Follow-up with her in 1 week for suture removal I will also place her in a digital splint at that point. And we will consider removing the pins in about 3 weeks.

## 2016-07-10 ENCOUNTER — Ambulatory Visit (INDEPENDENT_AMBULATORY_CARE_PROVIDER_SITE_OTHER): Payer: 59

## 2016-07-10 ENCOUNTER — Encounter: Payer: Self-pay | Admitting: Podiatry

## 2016-07-10 ENCOUNTER — Ambulatory Visit (INDEPENDENT_AMBULATORY_CARE_PROVIDER_SITE_OTHER): Payer: Self-pay | Admitting: Podiatry

## 2016-07-10 VITALS — BP 107/70 | HR 98 | Temp 96.8°F | Resp 16

## 2016-07-10 DIAGNOSIS — M778 Other enthesopathies, not elsewhere classified: Secondary | ICD-10-CM

## 2016-07-10 DIAGNOSIS — M7751 Other enthesopathy of right foot: Secondary | ICD-10-CM

## 2016-07-10 DIAGNOSIS — M21621 Bunionette of right foot: Secondary | ICD-10-CM

## 2016-07-10 DIAGNOSIS — M779 Enthesopathy, unspecified: Secondary | ICD-10-CM

## 2016-07-10 NOTE — Progress Notes (Signed)
She presents today with a follow-up of hammertoe repair second digit right foot. She states that pain has been present recently and she's been on her foot a lot over the holidays sending that she has a grandchild she has to play with.  Objective: Vital signs are stable she is alert and oriented 3 the pain is slightly retrograded into the foot at the tip of the toe. I slid this back today. No signs of infection.  Assessment: Well-healing surgical toe.  Plan: Removed sutures today. Follow up with her in 2 weeks to pull the pain and radiographs to be taken at that time.

## 2016-07-20 ENCOUNTER — Ambulatory Visit (INDEPENDENT_AMBULATORY_CARE_PROVIDER_SITE_OTHER): Payer: 59

## 2016-07-20 ENCOUNTER — Ambulatory Visit (INDEPENDENT_AMBULATORY_CARE_PROVIDER_SITE_OTHER): Payer: Self-pay | Admitting: Podiatry

## 2016-07-20 ENCOUNTER — Telehealth: Payer: Self-pay | Admitting: *Deleted

## 2016-07-20 ENCOUNTER — Encounter: Payer: Self-pay | Admitting: Podiatry

## 2016-07-20 VITALS — BP 116/75 | HR 107 | Temp 97.9°F | Resp 18

## 2016-07-20 DIAGNOSIS — M2041 Other hammer toe(s) (acquired), right foot: Secondary | ICD-10-CM | POA: Diagnosis not present

## 2016-07-20 MED ORDER — CEPHALEXIN 500 MG PO CAPS: 500.0000 mg | ORAL_CAPSULE | Freq: Three times a day (TID) | ORAL | 2 refills | Status: DC

## 2016-07-20 MED FILL — CEPHALEXIN 500 MG CAPSULE: 500 | 10 days supply | Qty: 30 | Fill #0

## 2016-07-20 NOTE — Telephone Encounter (Signed)
Pt states pin is scheduled to come out 07/24/2016, last night became very painful and pus began to come out of the end. I told pt I would like to get her in to see one of the doctors in [place] today, pt agreed. Transferred to schedulers.

## 2016-07-24 ENCOUNTER — Ambulatory Visit: Payer: 59

## 2016-07-24 NOTE — Progress Notes (Signed)
Subjective: Patricia Collier is a 71 y.o. is seen today in office s/p right 2nd digit repair preformed by Dr. Milinda Pointer. She presents today as she has noticed pus coming from the end of the toe around the pin site. She states it is better today than it was yesterday. She has not noticed any increase in warmth, redness or swelling. Denies any systemic complaints such as fevers, chills, nausea, vomiting. No calf pain, chest pain, shortness of breath.   Objective: General: No acute distress, AAOx3  DP/PT pulses palpable 2/4, CRT < 3 sec to all digits.  Protective sensation intact. Motor function intact.  Right foot: Incision is well coapted without any evidence of dehiscence and a scar has formed. There is no surrounding erythema, ascending cellulitis, fluctuance, crepitus, malodor, drainage/purulence from the incision There is mild edema around the surgical site. There is mild pain along the surgical site. K-wire site is intact to the 2nd toe. There does appear to be a moderate amount of dried drainage around the pin and only a small amount of serous drainage is expressed.  No other areas of tenderness to bilateral lower extremities.  No other open lesions or pre-ulcerative lesions.  No pain with calf compression, swelling, warmth, erythema.   Assessment and Plan:  Status post right 2nd hammertoe repair with drainage present.   -Treatment options discussed including all alternatives, risks, and complications -X-rays were obtained and reviewed with the patient. Hardware intact. No evidence of acute fracture.  -She is scheduled to have the wire removed on Tuesday. Due to the drainage, the wire was removed today in total. Start keflex.  -Ice/elevation -Pain medication as needed. -Monitor for any clinical signs or symptoms of infection and DVT/PE and directed to call the office immediately should any occur or go to the ER. -Follow-up in 1 week with Dr. Milinda Pointer or sooner if any problems arise. In the  meantime, encouraged to call the office with any questions, concerns, change in symptoms.   Celesta Gentile, DPM

## 2016-07-26 ENCOUNTER — Ambulatory Visit: Payer: 59

## 2016-07-27 ENCOUNTER — Ambulatory Visit (INDEPENDENT_AMBULATORY_CARE_PROVIDER_SITE_OTHER): Payer: Self-pay | Admitting: Podiatry

## 2016-07-27 ENCOUNTER — Telehealth: Payer: Self-pay | Admitting: *Deleted

## 2016-07-27 ENCOUNTER — Encounter: Payer: Self-pay | Admitting: Podiatry

## 2016-07-27 DIAGNOSIS — M2041 Other hammer toe(s) (acquired), right foot: Secondary | ICD-10-CM

## 2016-07-27 DIAGNOSIS — M21621 Bunionette of right foot: Secondary | ICD-10-CM

## 2016-07-27 NOTE — Telephone Encounter (Signed)
Pt states she would like to return to work 07/29/2016. I spoke with pt and she states she was seen by Dr. Jacqualyn Posey and he gave her what she needed to get back to work.

## 2016-08-02 NOTE — Progress Notes (Signed)
Subjective: Patricia Collier is a 71 y.o. is seen today in office s/p right 2nd digit repair preformed by Dr. Milinda Pointer. She presents today for follow-up. She'll her K wire removed last appointment. She did complete her course of Keflex and she states the toe is feeling better and she has not noticed any redness or swelling she is no longer had any drainage or pus coming from the toe. She states that she feels that she is ready to go back to work and she is asking to be released today to go back to work in a surgical shoe. Denies any systemic complaints such as fevers, chills, nausea, vomiting. No calf pain, chest pain, shortness of breath.   Objective: General: No acute distress, AAOx3  DP/PT pulses palpable 2/4, CRT < 3 sec to all digits.  Protective sensation intact. Motor function intact.  Right foot: Incision is well coapted without any evidence of dehiscence and a scar has formed. There is no edema, erythema, increase in warmth of the toe. The toes in rectus position however slightly dorsiflexed. There is no tenderness the toe there is no areas of pain or swelling. There is no other areas of tenderness identified bilaterally. No other open lesions or pre-ulcerative lesions.  No pain with calf compression, swelling, warmth, erythema.   Assessment and Plan:  Status post right 2nd hammertoe repair with drainage present.   -Treatment options discussed including all alternatives, risks, and complications -A Darco toe splint was dispensed to help hold her toe in a rectus position. Continue ice and elevation. She is really not taking pain medication. She can return to work on Sunday but she must wear surgical shoe and she states that she is able to do this. Also I showed her how to tape the toe to help with swelling. If she has any pain with work she is to not go to work. -Follow up in 2 weeks with Dr. Milinda Pointer or sooner if any issues are to arise. Call any questions or concerns in the meantime.  Celesta Gentile, DPM

## 2016-08-04 MED FILL — PHENTERMINE 37.5 MG TABLET: 37.5 | 90 days supply | Qty: 90 | Fill #1

## 2016-08-07 ENCOUNTER — Encounter: Payer: Self-pay | Admitting: Podiatry

## 2016-08-07 ENCOUNTER — Ambulatory Visit (INDEPENDENT_AMBULATORY_CARE_PROVIDER_SITE_OTHER): Payer: 59

## 2016-08-07 ENCOUNTER — Ambulatory Visit (INDEPENDENT_AMBULATORY_CARE_PROVIDER_SITE_OTHER): Payer: Self-pay | Admitting: Podiatry

## 2016-08-07 VITALS — Resp 16

## 2016-08-07 DIAGNOSIS — M21621 Bunionette of right foot: Secondary | ICD-10-CM

## 2016-08-07 DIAGNOSIS — M2041 Other hammer toe(s) (acquired), right foot: Secondary | ICD-10-CM

## 2016-08-07 DIAGNOSIS — Z9889 Other specified postprocedural states: Secondary | ICD-10-CM | POA: Diagnosis not present

## 2016-08-07 NOTE — Progress Notes (Signed)
She presents today for follow-up of her hammertoe repair second digit right foot with capsulotomy of the MTPJ. She states that when the pin was removed last visit is extremely painful. She states that the toe still feels tender but is okay.  Objective: Vital signs are stable alert and oriented 3 minimal edema about the second digit of the right foot though there is some erythema still present. No signs of infection. Radiographs taken today demonstrate nice consolidation at the level of the PIPJ.  Assessment: Healing surgical toe right.  Plan: Follow up with her on an as-needed basis.

## 2016-08-21 ENCOUNTER — Encounter: Payer: Self-pay | Admitting: Family Medicine

## 2016-08-21 MED ORDER — OSELTAMIVIR PHOSPHATE 75 MG PO CAPS: 75.0000 mg | ORAL_CAPSULE | Freq: Every day | ORAL | 0 refills | Status: DC

## 2016-08-21 MED FILL — OSELTAMIVIR PHOSPHATE 75 MG: 75 | 10 days supply | Qty: 10 | Fill #0

## 2016-11-06 ENCOUNTER — Encounter: Payer: Self-pay | Admitting: Podiatry

## 2016-11-26 ENCOUNTER — Other Ambulatory Visit: Payer: Self-pay | Admitting: Family Medicine

## 2016-11-26 DIAGNOSIS — Z1231 Encounter for screening mammogram for malignant neoplasm of breast: Secondary | ICD-10-CM

## 2016-11-28 ENCOUNTER — Ambulatory Visit: Payer: 59 | Admitting: Podiatry

## 2016-12-11 ENCOUNTER — Ambulatory Visit (INDEPENDENT_AMBULATORY_CARE_PROVIDER_SITE_OTHER): Payer: 59

## 2016-12-11 ENCOUNTER — Ambulatory Visit
Admission: RE | Admit: 2016-12-11 | Discharge: 2016-12-11 | Disposition: A | Discharge status: Home / Self Care | Payer: 59 | Source: Ambulatory Visit | Attending: Family Medicine | Admitting: Family Medicine

## 2016-12-11 ENCOUNTER — Ambulatory Visit (INDEPENDENT_AMBULATORY_CARE_PROVIDER_SITE_OTHER): Payer: Self-pay | Admitting: Podiatry

## 2016-12-11 ENCOUNTER — Ambulatory Visit: Payer: 59

## 2016-12-11 ENCOUNTER — Ambulatory Visit: Payer: 59 | Admitting: Podiatry

## 2016-12-11 ENCOUNTER — Encounter: Payer: Self-pay | Admitting: Podiatry

## 2016-12-11 DIAGNOSIS — Z1231 Encounter for screening mammogram for malignant neoplasm of breast: Secondary | ICD-10-CM

## 2016-12-11 DIAGNOSIS — M2041 Other hammer toe(s) (acquired), right foot: Secondary | ICD-10-CM | POA: Diagnosis not present

## 2016-12-11 DIAGNOSIS — T847XXA Infection and inflammatory reaction due to other internal orthopedic prosthetic devices, implants and grafts, initial encounter: Secondary | ICD-10-CM

## 2016-12-11 HISTORY — DX: Personal history of antineoplastic chemotherapy: Z92.21

## 2016-12-11 HISTORY — DX: Personal history of irradiation: Z92.3

## 2016-12-11 NOTE — Progress Notes (Signed)
She presents today for follow-up of her surgery to her second toe on her right foot. She states that this is the third surgery she's going to have him have on his toes she is very upset about it. She feels that she should not have to pay for any of this since it is not her fall. She feels that the screw and the toe has been removed when we removed the K wire from the toe.  Objective: Vital signs are stable she is alert and oriented 3 she has severe pain on palpation distal most aspect of the toe where the screw head is prominent. At this point I do feel this probably necessarily we removed this with no signs of infection. The toe does upset dorsiflex at the level of the metatarsophalangeal joint due to scar tissue. She states that she did not bend the pin or have any thing to do with the failure of this surgery. However radiographs taken today demonstrate extrusion of the screw possibly due to removal of a K wire which was bent. The K wire was obviously straight when we put it in through the screw and was confirmed on her initial postop evaluation. The second postop x-ray does demonstrate dorsiflexed second toe due to ambulation. This is what bent the K wire and ultimately resulted in the painful removal of the K wire through the screw. There is a good chance that this could have dislodged the screw to some degree.  Assessment: Painful internal fixation second toe left contracted second metatarsophalangeal joint due to scar tissue.  Plan: We discussed removing the screw under local anesthetic at surgery Center. She understands this and is amenable to it. Also recommended that we release the tight tissue at the level of the second metatarsophalangeal joint. She saw Dr. patient's consent form and we will do this since possible.

## 2016-12-11 NOTE — Patient Instructions (Signed)

## 2016-12-12 ENCOUNTER — Telehealth: Payer: Self-pay | Admitting: Podiatry

## 2016-12-12 ENCOUNTER — Telehealth: Payer: Self-pay | Admitting: *Deleted

## 2016-12-12 NOTE — Telephone Encounter (Signed)
Pt called and is scheduled for surgery but her job is saying she is not able to get off from work for surgery. Please call pt and discuss rescheduling

## 2016-12-12 NOTE — Telephone Encounter (Addendum)
"  I scheduled surgery with Dr. Milinda Pointer for June 22.  My boss is saying I may not be able to get off.  I work for [employer].  Can you check on the possibility of July 13 maybe.  I just want to know if that is available if I have to change it.  Maybe June 29 if there's any availability."    Dr. Milinda Pointer is not doing surgery on June 29 nor July 13.  He can do it on January 11, 2017.  "Okay, just go ahead and put me down for that date."

## 2016-12-12 NOTE — Telephone Encounter (Signed)
I'm returning your call.  Dr. Milinda Pointer is not doing surgery on either of those days that you requested.  "What's his next available?"  He can do it July 6.  "Well put me down for then.  I hate to be a bother but my job would not let me off for June 22."  You're no bother.

## 2016-12-13 NOTE — Telephone Encounter (Signed)
"  I just heard from my boss that I can have June 22 off.  So I hate to be a bother but can you reschedule me back to June 22 if possible."  Okay, I will reschedule it to June 22.

## 2016-12-13 NOTE — Telephone Encounter (Signed)
I will get post-op appointments rescheduled to reflect the new surgery date.

## 2016-12-28 ENCOUNTER — Encounter: Payer: Self-pay | Admitting: Podiatry

## 2016-12-28 DIAGNOSIS — Z4889 Encounter for other specified surgical aftercare: Secondary | ICD-10-CM | POA: Diagnosis not present

## 2016-12-28 DIAGNOSIS — M7751 Other enthesopathy of right foot: Secondary | ICD-10-CM | POA: Diagnosis not present

## 2016-12-28 DIAGNOSIS — M67873 Other specified disorders of tendon, right ankle and foot: Secondary | ICD-10-CM | POA: Diagnosis not present

## 2016-12-28 DIAGNOSIS — E7800 Pure hypercholesterolemia, unspecified: Secondary | ICD-10-CM | POA: Diagnosis not present

## 2016-12-28 DIAGNOSIS — Z472 Encounter for removal of internal fixation device: Secondary | ICD-10-CM | POA: Diagnosis not present

## 2016-12-28 DIAGNOSIS — T8484XA Pain due to internal orthopedic prosthetic devices, implants and grafts, initial encounter: Secondary | ICD-10-CM | POA: Diagnosis not present

## 2017-01-03 ENCOUNTER — Encounter: Payer: 59 | Admitting: Podiatry

## 2017-01-03 ENCOUNTER — Ambulatory Visit (INDEPENDENT_AMBULATORY_CARE_PROVIDER_SITE_OTHER): Payer: 59 | Admitting: Family Medicine

## 2017-01-03 ENCOUNTER — Encounter: Payer: Self-pay | Admitting: Podiatry

## 2017-01-03 ENCOUNTER — Ambulatory Visit (INDEPENDENT_AMBULATORY_CARE_PROVIDER_SITE_OTHER): Payer: 59

## 2017-01-03 ENCOUNTER — Encounter: Payer: Self-pay | Admitting: Family Medicine

## 2017-01-03 ENCOUNTER — Ambulatory Visit (INDEPENDENT_AMBULATORY_CARE_PROVIDER_SITE_OTHER): Payer: Self-pay | Admitting: Podiatry

## 2017-01-03 DIAGNOSIS — Z23 Encounter for immunization: Secondary | ICD-10-CM | POA: Diagnosis not present

## 2017-01-03 DIAGNOSIS — T847XXA Infection and inflammatory reaction due to other internal orthopedic prosthetic devices, implants and grafts, initial encounter: Secondary | ICD-10-CM

## 2017-01-03 DIAGNOSIS — R7989 Other specified abnormal findings of blood chemistry: Secondary | ICD-10-CM | POA: Diagnosis not present

## 2017-01-03 DIAGNOSIS — M2450 Contracture, unspecified joint: Secondary | ICD-10-CM

## 2017-01-03 DIAGNOSIS — Z0000 Encounter for general adult medical examination without abnormal findings: Secondary | ICD-10-CM | POA: Diagnosis not present

## 2017-01-03 LAB — BASIC METABOLIC PANEL
BUN: 21 mg/dL (ref 6–23)
CALCIUM: 9.2 mg/dL (ref 8.4–10.5)
CHLORIDE: 108 meq/L (ref 96–112)
CO2: 28 meq/L (ref 19–32)
CREATININE: 0.79 mg/dL (ref 0.40–1.20)
GFR: 76.8 mL/min (ref >60.00)
GLUCOSE: 75 mg/dL (ref 70–99)
Potassium: 3.9 mEq/L (ref 3.5–5.1)
Sodium: 143 mEq/L (ref 135–145)

## 2017-01-03 LAB — HEPATIC FUNCTION PANEL
ALT: 16 U/L (ref 0–35)
AST: 15 U/L (ref 0–37)
Albumin: 4.1 g/dL (ref 3.5–5.2)
Alkaline Phosphatase: 117 U/L (ref 39–117)
Bilirubin, Direct: 0.1 mg/dL (ref 0.0–0.3)
TOTAL PROTEIN: 6 g/dL (ref 6.0–8.3)
Total Bilirubin: 0.4 mg/dL (ref 0.2–1.2)

## 2017-01-03 LAB — CBC WITH DIFFERENTIAL/PLATELET
BASOS PCT: 0.8 % (ref 0.0–3.0)
Basophils Absolute: 0 10*3/uL (ref 0.0–0.1)
Eosinophils Absolute: 0.1 10*3/uL (ref 0.0–0.7)
Eosinophils Relative: 2.9 % (ref 0.0–5.0)
HCT: 41.6 % (ref 36.0–46.0)
Hemoglobin: 14.2 g/dL (ref 12.0–15.0)
LYMPHS ABS: 1 10*3/uL (ref 0.7–4.0)
Lymphocytes Relative: 21.1 % (ref 12.0–46.0)
MCHC: 34.1 g/dL (ref 30.0–36.0)
MCV: 89.3 fl (ref 78.0–100.0)
MONO ABS: 0.4 10*3/uL (ref 0.1–1.0)
Monocytes Relative: 8.9 % (ref 3.0–12.0)
NEUTROS ABS: 3.2 10*3/uL (ref 1.4–7.7)
NEUTROS PCT: 66.3 % (ref 43.0–77.0)
PLATELETS: 189 10*3/uL (ref 150.0–400.0)
RBC: 4.66 Mil/uL (ref 3.87–5.11)
RDW: 14.2 % (ref 11.5–15.5)
WBC: 4.9 10*3/uL (ref 4.0–10.5)

## 2017-01-03 LAB — LIPID PANEL
CHOLESTEROL: 200 mg/dL (ref 0–200)
HDL: 45.4 mg/dL (ref >39.00)
NonHDL: 154.67
TRIGLYCERIDES: 225 mg/dL — AB (ref 0.0–149.0)
Total CHOL/HDL Ratio: 4
VLDL: 45 mg/dL — ABNORMAL HIGH (ref 0.0–40.0)

## 2017-01-03 LAB — LDL CHOLESTEROL, DIRECT: Direct LDL: 109 mg/dL

## 2017-01-03 LAB — TSH: TSH: 1.54 u[IU]/mL (ref 0.35–4.50)

## 2017-01-03 LAB — VITAMIN D 25 HYDROXY (VIT D DEFICIENCY, FRACTURES): VITD: 29.66 ng/mL — AB (ref 30.00–100.00)

## 2017-01-03 NOTE — Assessment & Plan Note (Signed)
Pt's PE WNL.  UTD on mammo, DEXA, colonoscopy.  Due for Prevnar today.  Check labs.  Anticipatory guidance provided.

## 2017-01-03 NOTE — Progress Notes (Signed)
Pre visit review using our clinic review tool, if applicable. No additional management support is needed unless otherwise documented below in the visit note. 

## 2017-01-03 NOTE — Patient Instructions (Signed)
Follow up in 1 year or as needed We'll notify you of your lab results and make any changes if needed Keep up the good work on healthy diet and regular exercise- you look great! If you have not had your Tdap (tetanus shot), please let me know and we'll get you scheduled If the sadness or anxiety worsens, let me know! Call with any questions or concerns Have a great summer!!!

## 2017-01-03 NOTE — Progress Notes (Signed)
   Subjective:    Patient ID: Patricia Collier, female    DOB: Dec 29, 1945, 70 y.o.   MRN: [id number]  HPI CPE- UTD on colonoscopy, mammo.  No need for pap due to hysterectomy.  Due for Prevnar, Tdap.  No concerns today.   Review of Systems Patient reports no vision/ hearing changes, adenopathy,fever, weight change,  persistant/recurrent hoarseness , swallowing issues, chest pain, palpitations, edema, persistant/recurrent cough, hemoptysis, dyspnea (rest/exertional/paroxysmal nocturnal), gastrointestinal bleeding (melena, rectal bleeding), abdominal pain, significant heartburn, bowel changes, GU symptoms (dysuria, hematuria, incontinence), Gyn symptoms (abnormal  bleeding, pain),  syncope, focal weakness, memory loss, skin/hair/nail changes, abnormal bruising or bleeding, anxiety, or depression.   + chemo neuropathy    Objective:   Physical Exam General Appearance:    Alert, cooperative, no distress, appears stated age  Head:    Normocephalic, without obvious abnormality, atraumatic  Eyes:    PERRL, conjunctiva/corneas clear, EOM's intact, fundi    benign, both eyes  Ears:    Normal TM's and external ear canals, both ears  Nose:   Nares normal, septum midline, mucosa normal, no drainage    or sinus tenderness  Throat:   Lips, mucosa, and tongue normal; teeth and gums normal  Neck:   Supple, symmetrical, trachea midline, no adenopathy;    Thyroid: no enlargement/tenderness/nodules  Back:     Symmetric, no curvature, ROM normal, no CVA tenderness  Lungs:     Clear to auscultation bilaterally, respirations unlabored  Chest Wall:    No tenderness or deformity   Heart:    Regular rate and rhythm, S1 and S2 normal, no murmur, rub   or gallop  Breast Exam:    Deferred to GYN  Abdomen:     Soft, non-tender, bowel sounds active all four quadrants,    no masses, no organomegaly  Genitalia:    Deferred to GYN  Rectal:    Extremities:   Extremities normal, atraumatic, no cyanosis or edema    Pulses:   2+ and symmetric all extremities  Skin:   Skin color, texture, turgor normal, no rashes or lesions  Lymph nodes:   Cervical, supraclavicular, and axillary nodes normal  Neurologic:   CNII-XII intact, normal strength, sensation and reflexes    throughout          Assessment & Plan:

## 2017-01-03 NOTE — Addendum Note (Signed)
Addended by: Katina Dung on: 01/03/2017 03:22 PM   Modules accepted: Orders

## 2017-01-04 ENCOUNTER — Encounter: Payer: Self-pay | Admitting: General Practice

## 2017-01-04 NOTE — Progress Notes (Signed)
DOS 06.22.2018 1. Removal internal screw fixation 2nd toe right. 2. Release scar tissue 2nd MTPJ right foot.

## 2017-01-06 NOTE — Progress Notes (Signed)
Subjective: Patricia Collier is a 71 y.o. is seen today in office s/p HWR and MTPJ joint release on the right preformed on 12/28/2016. She states that since having hardware removed she has had complete relief of any pain that she is having and at this point she is happy with the outcome of the toe. She does again express frustration with having had 3 surgeries to this toe at this point she's not having any pain to her toe and doing well. She is remaining in the surgical shoe. Denies any systemic complaints such as fevers, chills, nausea, vomiting. No calf pain, chest pain, shortness of breath.   Objective: General: No acute distress, AAOx3  DP/PT pulses palpable 2/4, CRT < 3 sec to all digits.  Protective sensation intact. Motor function intact.  Right foot: Incision is well coapted without any evidence of dehiscence and sutures are intact. There is no surrounding erythema, ascending cellulitis, fluctuance, crepitus, malodor, drainage/purulence. There is minimal edema around the surgical site. There is no pain along the surgical site.  No other areas of tenderness to bilateral lower extremities.  No other open lesions or pre-ulcerative lesions.  No pain with calf compression, swelling, warmth, erythema.   Assessment and Plan:  Status post right foot HWR, joint release, doing well with no complications without any signs of infection.   -Treatment options discussed including all alternatives, risks, and complications -X-rays were obtained and reviewed with the patient. Status post hardware removal. No evidence of acute fracture identified. -Antibiotic ointment was applied over the incisions followed by dry sterile dressing. Keep dressing clean, dry, intact. -Remain in surgical shoe. -Ice/elevation -Pain medication as needed-she has not been needing this.  -Monitor for any clinical signs or symptoms of infection and DVT/PE and directed to call the office immediately should any occur or go to the  ER. -Follow-up in 1 week with Dr. Milinda Pointer to remove sutures or sooner if any problems arise. In the meantime, encouraged to call the office with any questions, concerns, change in symptoms.   Celesta Gentile, DPM

## 2017-01-07 ENCOUNTER — Encounter: Payer: Self-pay | Admitting: Family Medicine

## 2017-01-10 ENCOUNTER — Ambulatory Visit (INDEPENDENT_AMBULATORY_CARE_PROVIDER_SITE_OTHER): Payer: 59 | Admitting: Podiatry

## 2017-01-10 DIAGNOSIS — Z9889 Other specified postprocedural states: Secondary | ICD-10-CM

## 2017-01-10 DIAGNOSIS — M2041 Other hammer toe(s) (acquired), right foot: Secondary | ICD-10-CM

## 2017-01-10 NOTE — Progress Notes (Signed)
She presents today for suture removal second digit right foot. She states that she is doing very well after surgery. She is status post removal of internal fixation second digit right toe and release second metatarsophalangeal joint.  Objective: Vital signs are stable alert and oriented 3. Sutures are intact margins were coapted we have removed the sutures today March remain well coapted she has no pain in the toe whatsoever.  Assessment: When surgical foot right.  Plan: Follow up with me as needed.

## 2017-01-17 ENCOUNTER — Ambulatory Visit: Payer: 59

## 2017-01-24 ENCOUNTER — Ambulatory Visit: Payer: 59

## 2017-02-04 ENCOUNTER — Emergency Department (HOSPITAL_COMMUNITY): Payer: 59

## 2017-02-04 ENCOUNTER — Encounter (HOSPITAL_COMMUNITY): Payer: Self-pay

## 2017-02-04 ENCOUNTER — Encounter: Payer: Self-pay | Admitting: Family Medicine

## 2017-02-04 ENCOUNTER — Inpatient Hospital Stay (HOSPITAL_COMMUNITY)
Admission: EM | Admit: 2017-02-04 | Discharge: 2017-02-07 | DRG: 287 | Disposition: A | Discharge status: Home / Self Care | Payer: 59 | Attending: Internal Medicine | Admitting: Internal Medicine

## 2017-02-04 ENCOUNTER — Ambulatory Visit (INDEPENDENT_AMBULATORY_CARE_PROVIDER_SITE_OTHER): Payer: 59 | Admitting: Family Medicine

## 2017-02-04 VITALS — BP 96/82 | HR 114 | Temp 98.1°F | Resp 17 | Ht 64.0 in | Wt 146.1 lb

## 2017-02-04 DIAGNOSIS — Z8249 Family history of ischemic heart disease and other diseases of the circulatory system: Secondary | ICD-10-CM

## 2017-02-04 DIAGNOSIS — I428 Other cardiomyopathies: Secondary | ICD-10-CM | POA: Diagnosis present

## 2017-02-04 DIAGNOSIS — I517 Cardiomegaly: Secondary | ICD-10-CM | POA: Diagnosis present

## 2017-02-04 DIAGNOSIS — Z923 Personal history of irradiation: Secondary | ICD-10-CM

## 2017-02-04 DIAGNOSIS — R911 Solitary pulmonary nodule: Secondary | ICD-10-CM | POA: Diagnosis present

## 2017-02-04 DIAGNOSIS — M159 Polyosteoarthritis, unspecified: Secondary | ICD-10-CM | POA: Diagnosis present

## 2017-02-04 DIAGNOSIS — Z801 Family history of malignant neoplasm of trachea, bronchus and lung: Secondary | ICD-10-CM

## 2017-02-04 DIAGNOSIS — I5023 Acute on chronic systolic (congestive) heart failure: Secondary | ICD-10-CM

## 2017-02-04 DIAGNOSIS — E785 Hyperlipidemia, unspecified: Secondary | ICD-10-CM | POA: Diagnosis present

## 2017-02-04 DIAGNOSIS — Z808 Family history of malignant neoplasm of other organs or systems: Secondary | ICD-10-CM

## 2017-02-04 DIAGNOSIS — R0602 Shortness of breath: Secondary | ICD-10-CM

## 2017-02-04 DIAGNOSIS — R0609 Other forms of dyspnea: Secondary | ICD-10-CM | POA: Diagnosis not present

## 2017-02-04 DIAGNOSIS — R9431 Abnormal electrocardiogram [ECG] [EKG]: Secondary | ICD-10-CM | POA: Diagnosis not present

## 2017-02-04 DIAGNOSIS — R000 Tachycardia, unspecified: Secondary | ICD-10-CM | POA: Diagnosis not present

## 2017-02-04 DIAGNOSIS — I5032 Chronic diastolic (congestive) heart failure: Secondary | ICD-10-CM | POA: Diagnosis not present

## 2017-02-04 DIAGNOSIS — Z90710 Acquired absence of both cervix and uterus: Secondary | ICD-10-CM | POA: Diagnosis not present

## 2017-02-04 DIAGNOSIS — Z87891 Personal history of nicotine dependence: Secondary | ICD-10-CM

## 2017-02-04 DIAGNOSIS — Z803 Family history of malignant neoplasm of breast: Secondary | ICD-10-CM

## 2017-02-04 DIAGNOSIS — I080 Rheumatic disorders of both mitral and aortic valves: Secondary | ICD-10-CM | POA: Diagnosis present

## 2017-02-04 DIAGNOSIS — Z9049 Acquired absence of other specified parts of digestive tract: Secondary | ICD-10-CM | POA: Diagnosis not present

## 2017-02-04 DIAGNOSIS — I5043 Acute on chronic combined systolic (congestive) and diastolic (congestive) heart failure: Secondary | ICD-10-CM | POA: Diagnosis present

## 2017-02-04 DIAGNOSIS — Z853 Personal history of malignant neoplasm of breast: Secondary | ICD-10-CM

## 2017-02-04 DIAGNOSIS — Z9221 Personal history of antineoplastic chemotherapy: Secondary | ICD-10-CM | POA: Diagnosis not present

## 2017-02-04 DIAGNOSIS — I340 Nonrheumatic mitral (valve) insufficiency: Secondary | ICD-10-CM | POA: Diagnosis not present

## 2017-02-04 DIAGNOSIS — I2510 Atherosclerotic heart disease of native coronary artery without angina pectoris: Secondary | ICD-10-CM | POA: Diagnosis present

## 2017-02-04 DIAGNOSIS — I5021 Acute systolic (congestive) heart failure: Secondary | ICD-10-CM | POA: Diagnosis not present

## 2017-02-04 DIAGNOSIS — I110 Hypertensive heart disease with heart failure: Secondary | ICD-10-CM | POA: Diagnosis present

## 2017-02-04 DIAGNOSIS — E8779 Other fluid overload: Secondary | ICD-10-CM | POA: Diagnosis not present

## 2017-02-04 DIAGNOSIS — I493 Ventricular premature depolarization: Secondary | ICD-10-CM | POA: Diagnosis present

## 2017-02-04 DIAGNOSIS — Z885 Allergy status to narcotic agent status: Secondary | ICD-10-CM | POA: Diagnosis not present

## 2017-02-04 DIAGNOSIS — E8770 Fluid overload, unspecified: Secondary | ICD-10-CM | POA: Diagnosis present

## 2017-02-04 DIAGNOSIS — I509 Heart failure, unspecified: Secondary | ICD-10-CM | POA: Diagnosis not present

## 2017-02-04 HISTORY — DX: Heart failure, unspecified: I50.9

## 2017-02-04 HISTORY — DX: Unspecified convulsions: R56.9

## 2017-02-04 HISTORY — DX: Malignant neoplasm of unspecified site of left female breast: C50.912

## 2017-02-04 LAB — BASIC METABOLIC PANEL
ANION GAP: 7 (ref 5–15)
BUN: 15 mg/dL (ref 6–20)
CHLORIDE: 110 mmol/L (ref 101–111)
CO2: 22 mmol/L (ref 22–32)
Calcium: 9.2 mg/dL (ref 8.9–10.3)
Creatinine, Ser: 0.68 mg/dL (ref 0.44–1.00)
GFR calc non Af Amer: >60 mL/min (ref >60)
GLUCOSE: 110 mg/dL — AB (ref 65–99)
POTASSIUM: 4 mmol/L (ref 3.5–5.1)
Sodium: 139 mmol/L (ref 135–145)

## 2017-02-04 LAB — TROPONIN I: Troponin I: <0.03 ng/mL (ref <0.03)

## 2017-02-04 LAB — CBC
HEMATOCRIT: 42.2 % (ref 36.0–46.0)
HEMOGLOBIN: 14.2 g/dL (ref 12.0–15.0)
MCH: 29.8 pg (ref 26.0–34.0)
MCHC: 33.6 g/dL (ref 30.0–36.0)
MCV: 88.7 fL (ref 78.0–100.0)
Platelets: 193 10*3/uL (ref 150–400)
RBC: 4.76 MIL/uL (ref 3.87–5.11)
RDW: 13.8 % (ref 11.5–15.5)
WBC: 6.6 10*3/uL (ref 4.0–10.5)

## 2017-02-04 LAB — I-STAT TROPONIN, ED: TROPONIN I, POC: 0.01 ng/mL (ref 0.00–0.08)

## 2017-02-04 LAB — BRAIN NATRIURETIC PEPTIDE: B NATRIURETIC PEPTIDE 5: 1065.2 pg/mL — AB (ref 0.0–100.0)

## 2017-02-04 MED ORDER — HEPARIN (PORCINE) IN NACL 100-0.45 UNIT/ML-% IJ SOLN
1050.0000 [IU]/h | INTRAMUSCULAR | Status: DC
  Administered 2017-02-04: 1050 [IU]/h via INTRAVENOUS
  Filled 2017-02-04: qty 250

## 2017-02-04 MED ORDER — HEPARIN SODIUM (PORCINE) 5000 UNIT/ML IJ SOLN
60.0000 [IU]/kg | Freq: Once | INTRAMUSCULAR | Status: DC
Start: 2017-02-04 — End: 2017-02-04
  Filled 2017-02-04: qty 1

## 2017-02-04 MED ORDER — HEPARIN BOLUS VIA INFUSION
4000.0000 [IU] | Freq: Once | INTRAVENOUS | Status: AC
  Administered 2017-02-04: 4000 [IU] via INTRAVENOUS
  Filled 2017-02-04: qty 4000

## 2017-02-04 MED ORDER — IOPAMIDOL (ISOVUE-370) INJECTION 76%
INTRAVENOUS | Status: AC
  Administered 2017-02-04: 100 mL
  Filled 2017-02-04: qty 100

## 2017-02-04 NOTE — Progress Notes (Signed)
ANTICOAGULATION CONSULT NOTE - Initial Consult  Pharmacy Consult for Heparin Indication: r/o PE  Allergies  Allergen Reactions  . Effexor [Venlafaxine] Other (See Comments)    Caused depression, negative thoughts, and crying   . Hydrocodone-Acetaminophen Itching and Nausea And Vomiting    Patient can tolerate with Zofran    Patient Measurements: Height 5'4'' Weight 66.3kg Ideal body weight: 54.7kg  Vital Signs: Temp: 97.4 F (36.3 C) (07/30 1427) Temp Source: Oral (07/30 1427) BP: 112/84 (07/30 1751) Pulse Rate: 106 (07/30 1751)  Labs:  Recent Labs  02/04/17 1443  HGB 14.2  HCT 42.2  PLT 193  CREATININE 0.68    Estimated Creatinine Clearance: 63 mL/min (by C-G formula based on SCr of 0.68 mg/dL).   Medical History: Past Medical History:  Diagnosis Date  . Arthritis    ostearthritis -joints, greater left knee.  . [facility] cancer [facility])    surgery, radiation, chemotherapy.-no further oncology visits.  . Foot joint disorder    pain greater left foot ? related to retained hardware.  . Hyperlipidemia   . 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 Personal history of chemotherapy 2009  . Personal history of radiation therapy 2009  . SVT (supraventricular tachycardia) ([facility])    x1 episode -3 yrs ago- no issues since.All heart testing negative. No further cardiac visits needed.   Assessment: 70yof s/p recent knee surgery (01/03/17) now presents with SOB. She will begin heparin for possible PE. CT angio pending. No anticoagulants pta. Baseline labs wnl.  Goal of Therapy:  Heparin level 0.3-0.7 units/ml Monitor platelets by anticoagulation protocol: Yes   Plan:  1) Heparin bolus 4000 units x 1 2) Heparin drip at 1050 units/hr 3) 6 hour heparin level 4) Daily heparin level and CBC  Deboraha Sprang 02/04/2017,5:58 PM

## 2017-02-04 NOTE — ED Provider Notes (Signed)
[facility] DEPT Provider Note   CSN: [id number] Arrival date & time: 02/04/17  1417     History   Chief Complaint Chief Complaint  Patient presents with  . Chest Pain  . Shortness of Breath    HPI Patricia Collier is a 71 y.o. female.  HPI  71 y.o. female with a hx of Breast Cancer, HLD, presents to the Emergency Department today due to increased shortness of breath for the past several weeks. Pt states that she had low BP and her HR was very high over the weekend. Saw PCP and sent to ED for eval for PE. Noted foot surgery on 12-28-16. Went on vacation on 7/8 and was unable to do the steps in the 3 story house without having to rest. This weekend pt was SOB w/ minimal exertion. Improved with rest. No active CP. No N/V. No diaphoresis. No hx DVT/PE. Denies pain currently. 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 Notes remote hx SVT, but resolved. Pt not on blood thinners. No cough.congestion. No URI symptoms. No fevers. No other symptoms noted.   Past Medical History:  Diagnosis Date  . Arthritis    ostearthritis -joints, greater left knee.  . Breast cancer [facility])    surgery, radiation, chemotherapy.-no further oncology visits.  . Foot joint disorder    pain greater left foot ? related to retained hardware.  . Hyperlipidemia   . Neuromuscular disorder ([place])    neuropathy due to chemotherapy  . Personal history of chemotherapy 2009  . Personal history of radiation therapy 2009  . SVT (supraventricular tachycardia) ([facility])    x1 episode -3 yrs ago- no issues since.All heart testing negative. No further cardiac visits needed.    Patient Active Problem List   Diagnosis Date Noted  . Physical exam 01/03/2017  . Lateral meniscal tear 09/21/2015  . Hematuria, gross 06/21/2014  . Right flank pain 06/21/2014  . Inadequate sleep hygiene 11/20/2013  . 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 Knee pain, left 11/16/2013  . Abdominal pain,  epigastric 11/16/2013  . Excessive sleepiness while driving 83/66/2947  . Hip pain 05/28/2012  . Abnormal EKG 05/28/2012  . Hot flashes 05/28/2012  . Chest pain 02/11/2012  . Dyspnea on exertion 12/27/2011  . History of left breast cancer 11/19/2011  . Sinusitis 01/02/2009  . HYPERLIPIDEMIA NEC/NOS 01/23/2007  . BRONCHITIS, ACUTE 01/23/2007    Past Surgical History:  Procedure Laterality Date  . BREAST LUMPECTOMY Left 2009   sentinel node dissection  . BUNIONECTOMY     past has appointment to have foot check on -Pain presently more in left foot, ? related to retained hardware"  . CARDIAC CATHETERIZATION    . CHOLECYSTECTOMY     laparscopic  . DIAGNOSTIC LAPAROSCOPY     multiple for adhesions  . HAMMER TOE SURGERY    . KNEE ARTHROSCOPY Left 09/21/2015   Procedure: LEFT ARTHROSCOPY KNEE WITH LATERAL MENISCUS DEBRIDEMENT;  Surgeon: Gaynelle Arabian, MD;  Location: [facility] ORS;  Service: Orthopedics;  Laterality: Left;  . NASAL SINUS SURGERY  1979  . TOTAL ABDOMINAL HYSTERECTOMY  1984  . TRANSANAL RECTOPEXY      OB History    No data available       Home Medications    Prior to Admission medications   Not on File    Family History Family History  Problem Relation Age of Onset  . Heart disease Mother   . Hypertension Father   .  Breast cancer Sister   . Lung cancer Sister   . Skin cancer Brother     Social History Social History  Substance Use Topics  . Smoking status: Former Smoker    Packs/day: 2.00    Years: 10.00    Types: Cigarettes    Quit date: 07/09/1989  . Smokeless tobacco: Never Used     Comment: 89 ys ago.  . Alcohol use No     Allergies   Hydrocodone-acetaminophen   Review of Systems Review of Systems ROS reviewed and all are negative for acute change except as noted in the HPI.  Physical Exam Updated Vital Signs BP (!) 126/97 (BP Location: Left Arm)   Pulse (!) 119   Temp (!) 97.4 F (36.3 C) (Oral)   Resp 18   SpO2 98%   Physical Exam   Constitutional: She is oriented to person, place, and time. She appears well-developed and well-nourished. No distress.  HENT:  Head: Normocephalic and atraumatic.  Right Ear: Tympanic membrane, external ear and ear canal normal.  Left Ear: Tympanic membrane, external ear and ear canal normal.  Nose: Nose normal.  Mouth/Throat: Uvula is midline, oropharynx is clear and moist and mucous membranes are normal. No trismus in the jaw. No oropharyngeal exudate, posterior oropharyngeal erythema or tonsillar abscesses.  Eyes: Pupils are equal, round, and reactive to light. EOM are normal.  Neck: Normal range of motion. Neck supple. No tracheal deviation present.  Cardiovascular: Regular rhythm, S1 normal, S2 normal, normal heart sounds, intact distal pulses and normal pulses.  Tachycardia present.   Pulmonary/Chest: Effort normal and breath sounds normal. No respiratory distress. She has no decreased breath sounds. She has no wheezes. She has no rhonchi. She has no rales.  Abdominal: Normal appearance and bowel sounds are normal. There is no tenderness.  Musculoskeletal: Normal range of motion.  Neurological: She is alert and oriented to person, place, and time.  Skin: Skin is warm and dry.  Psychiatric: She has a normal mood and affect. Her speech is normal and behavior is normal. Thought content normal.   ED Treatments / Results  Labs (all labs ordered are listed, but only abnormal results are displayed) Labs Reviewed  BASIC METABOLIC PANEL - Abnormal; Notable for the following:       Result Value   Glucose, Bld 110 (*)    All other components within normal limits  CBC  HEPARIN LEVEL (UNFRACTIONATED)  CBC  HEPARIN LEVEL (UNFRACTIONATED)  I-STAT TROPONIN, ED    EKG  EKG Interpretation  Date/Time:  Monday February 04 2017 14:30:34 EDT Ventricular Rate:  113 PR Interval:  146 QRS Duration: 98 QT Interval:  372 QTC Calculation: 510 R Axis:   -57 Text Interpretation:  trigeminy Biatrial  enlargement Pulmonary disease pattern Left anterior fascicular block Abnormal ECG all new changes since july 2013 Reconfirmed by Merrily Pew [phone]) on 02/04/2017 5:13:19 PM       Radiology Dg Chest 2 View  Result Date: 02/04/2017 CLINICAL DATA:  Increased shortness of breath for the past 2 weeks. EXAM: CHEST  2 VIEW COMPARISON:  06/13/2010. FINDINGS: Interval enlarged cardiac silhouette and prominent pulmonary vasculature and interstitial markings with Kerley lines. There are also interval small bilateral pleural effusions. Mild thoracic spine degenerative changes. Cholecystectomy clips. IMPRESSION: Interval cardiomegaly and changes of congestive heart failure. Electronically Signed   By: Claudie Revering M.D.   On: 02/04/2017 17:46   Ct Angio Chest Pe W And/or Wo Contrast  Result Date: 02/04/2017 CLINICAL DATA:  Shortness of breath and chest tightness. Breast cancer 9 years ago. EXAM: CT ANGIOGRAPHY CHEST WITH CONTRAST TECHNIQUE: Multidetector CT imaging of the chest was performed using the standard protocol during bolus administration of intravenous contrast. Multiplanar CT image reconstructions and MIPs were obtained to evaluate the vascular anatomy. CONTRAST:  100 mL of Isovue 370 COMPARISON:  Chest x-ray from today.  Chest CT October 27, 2009 FINDINGS: Cardiovascular: Cardiomegaly is identified. Mild coronary artery calcifications. Evaluation of the thoracic aorta is limited due to timing of contrast but no aneurysm is seen. Atherosclerotic changes noted. No pulmonary emboli are identified. Mediastinum/Nodes: Small bilateral pleural effusions. No pericardial effusion. No adenopathy. The thyroid and esophagus are normal. Lungs/Pleura: Central airways are normal. No pneumothorax. Interlobular septal thickening, ground-glass, and bilateral pleural effusions are consistent with pulmonary edema. There is a flat nodular density in the medial right apex on series 6, image 27 not seen in 2011 measuring up to 6  mm. No other nodules. No masses. No evidence of pneumonia or aspiration. Upper Abdomen: No acute abnormality. Musculoskeletal: No chest wall abnormality. No acute or significant osseous findings. Review of the MIP images confirms the above findings. IMPRESSION: 1. No pulmonary emboli. 2. Cardiomegaly, small bilateral effusions, and pulmonary edema. 3. Atherosclerotic change in the thoracic aorta. 4. Flat nodular density in the medial right apex may be part of the broader process. However, this finding was not seen in 2011 and the patient has a history of breast cancer. As a result, recommend a short-term follow-up in approximately 3-6 months after resolution of symptoms to ensure resolution. Aortic Atherosclerosis (ICD10-I70.0). Electronically Signed   By: Dorise Bullion III M.D   On: 02/04/2017 18:57    Procedures Procedures (including critical care time)  Medications Ordered in ED Medications  heparin ADULT infusion 100 units/mL (25000 units/223mL sodium chloride 0.45%) (1,050 Units/hr Intravenous New Bag/Given 02/04/17 1834)  iopamidol (ISOVUE-370) 76 % injection (100 mLs  Contrast Given 02/04/17 1818)  heparin bolus via infusion 4,000 Units (4,000 Units Intravenous Bolus from Bag 02/04/17 1838)     Initial Impression / Assessment and Plan / ED Course  I have reviewed the triage vital signs and the nursing notes.  Pertinent labs & imaging results that were available during my care of the patient were reviewed by me and considered in my medical decision making (see chart for details).  Final Clinical Impressions(s) / ED Diagnoses  {I have reviewed and evaluated the relevant laboratory values. {I have reviewed and evaluated the relevant imaging studies. {I have interpreted the relevant EKG. {I have reviewed the relevant previous healthcare records.  {I obtained HPI from historian. {Patient discussed with supervising physician.  ED Course:  Assessment: Pt is a 71 y.o. female with a hx of Breast  Cancer, HLD, presents to the Emergency Department today due to increased shortness of breath for the past several weeks. Pt states that she had low BP and her HR was very high over the weekend. Saw PCP and sent to ED for eval for PE. Noted foot surgery on 12-28-16. Went on vacation on 7/8 and was unable to do the steps in the 3 story house without having to rest. This weekend pt was SOB w/ minimal exertion. Improved with rest. No active CP. No N/V. No diaphoresis. No hx DVT/PE. Denies pain currently. 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 Notes remote hx SVT, but resolved. Pt not on blood thinners. No cough.congestion. No URI symptoms. No fevers. No  other symptoms noted. . On exam, pt in NAD. Nontoxic/nonseptic appearing. VS with tachycardia. BP soft. Afebrile. Lungs CTA. Heart RRR. Abdomen nontender soft. CBC unremarkable. BMP unremarkable. Trop negative. EKG with trigeminy. High clinical suspicon for PE given tachycardia, hypotension, recent surgery. Started on Heparin due to possibility of right heart strain with hypotension noted. CT Angio negative. Cardiomegaly noted with bilateral pleural effusions. Heparin was discontinued. Plan is to Admit for new onset CHF.   Disposition/Plan:  Admit Pt acknowledges and agrees with plan  Supervising Physician Mesner, Corene Cornea, MD  Final diagnoses:  Acute congestive heart failure, unspecified heart failure type [facility])    New Prescriptions New Prescriptions   No medications on file       Shary Decamp, Hershal Coria 02/05/17 0006    Mesner, Corene Cornea, MD 02/05/17 0021

## 2017-02-04 NOTE — Progress Notes (Signed)
   Subjective:    Patient ID: Patricia Collier, female    DOB: [birth year], 71 y.o.   MRN: [id number]  HPI SOB- pt had foot surgery 6/22 and she woke in the recovery room feeling as if she couldn't get a deep breath.  Went on vacation on 7/8 and was unable to do the steps in the 3 story house w/o having to rest.  This weekend pt was SOB w/ minimal exertion- unable to take her granddaughter to the [place].  Pt is tachycardic, SOB, and O2 sats are 93%.  Pt having rapid breathing.  Pt had EKG this AM 'off the record' at the hospital that showed sinus tach.  Pt reports some chest pressure.   Review of Systems For ROS see HPI     Objective:   Physical Exam  Constitutional: She is oriented to person, place, and time. She appears well-developed and well-nourished.  Obviously uncomfortable  HENT:  Head: Normocephalic and atraumatic.  Cardiovascular: Intact distal pulses.   No murmur heard. Tachy but regular S1/S2  Pulmonary/Chest: Effort normal and breath sounds normal. No respiratory distress. She has no wheezes. She has no rales.  Musculoskeletal: She exhibits no edema or tenderness.  Neurological: She is alert and oriented to person, place, and time.  Skin: Skin is warm and dry.  Psychiatric: She has a normal mood and affect. Her behavior is normal. Thought content normal.  Vitals reviewed.         Assessment & Plan:  Tachycardia w/ SOB- new.  Pt had some SOB earlier in the month but she thought this was improving until this weekend when she was not able to do anything at all w/o feeling severely SOB.  Some chest pressure, palpitations.  She had surgery on her foot the end of June and had a period of relative immobility.  Given her tachycardia and mild hypoxia, I'm most worried about PE.  EKG shows tachycardia w/ PVCs.  Pt was sent to ER for complete evaluation.  I offered to call someone to drive her or call EMS but pt declined- prefers to drive herself.  Called [facility] to give  report and alert them to her arrival.  Will follow.

## 2017-02-04 NOTE — ED Notes (Signed)
Pt has returned from xray

## 2017-02-04 NOTE — ED Notes (Addendum)
Pt transported to xray 

## 2017-02-04 NOTE — ED Triage Notes (Signed)
Pt had surgery on 6/22 and has had increased shortness of breath for the last couple of weeks. Pt noted herself to be hypotensive and tachycardic over the weekend. Pt alert and oriented, tachycardic in triage.

## 2017-02-04 NOTE — Patient Instructions (Signed)
Go to [facility] ER for evaluation I am worried that after your surgery you may have developed a clot I am calling the ER to let them know you are coming Call with any questions or concerns Hang in there!  We're going to figure this out!!!

## 2017-02-04 NOTE — ED Provider Notes (Signed)
Medical screening examination/treatment/procedure(s) were conducted as a shared visit with non-physician practitioner(s) and myself.  I personally evaluated the patient during the encounter.  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 She also has some low blood pressures over the weekend that were unexplained. Saw her primary doctor who sent her here. On my exam she has tachycardia with normal blood pressures. The lungs are clear. Her heart is normal. EKG has a wide QRS and a shifted axis. Redness consistent with pulmonary disease pattern. High suspicion this point is for pulmonary embolus. We'll CT scan for the same she does have recent travel and other risk factors. Start On heparin.. If CT negativeWill need admission either way.   EKG Interpretation  Date/Time:  Monday February 04 2017 14:30:34 EDT Ventricular Rate:  113 PR Interval:  146 QRS Duration: 98 QT Interval:  372 QTC Calculation: 510 R Axis:   -57 Text Interpretation:  Sinus tachycardia with frequent Premature ventricular complexes Biatrial enlargement Pulmonary disease pattern Left anterior fascicular block Abnormal ECG all new changes since july 2013 Confirmed by Merrily Pew [phone]) on 02/04/2017 4:53:18 PM         Jadon Ressler, Corene Cornea, MD 02/05/17 0021

## 2017-02-04 NOTE — H&P (Signed)
Patricia Collier YDX:[id number] DOB: 22-Feb-1946 DOA: 02/04/2017     PCP: [name], MD   Outpatient Specialists: Oncology [facility], Cardiology [facility]  Patient coming from:    home Lives   With family    Chief Complaint: dyspnea on exertion  HPI: Patricia Collier is a 71 y.o. female with medical history significant of breast cancer remote, history of diastolic dysfunction right foot hammertoe status recurrent procedures    Presented with worsening shortness of breath with worsening dyspnea on exertion has been going on for past few weeks. The shortness of breath has progressed to the point where she is unable to ambulate result shortness of breath. Ports chest pressure but no chest pain. 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 Initial tachycardia  She's been having intermittent shortness of breath periodically. Even Recent Surgery PCP Was Concerned regarding PE she has no prior history of DVT or PE. She has remote history of SVT but that has been not a problem for him. Time no fevers or chills.   Regarding pertinent Chronic problems: Patient has been diagnosed with breast cancer in 2009 of the left breast is post lumpectomy She received dose-dense doxorubicin and cyclophosphamide x4, then weekly paclitaxel x12.  After radiation therapy completed December 2009, she was tried on tamoxifen and letrozole with very poor tolerance.  She is now followed off treatment.  Years ago patient was reporting dyspnea and had echogram done in May 2016 showed EF 55% and hypokinesis of apical septum with grade 1 diastolic dysfunction with mild stenosis of the aortic valve   IN ER:  Temp (24hrs), Avg:97.8 F (36.6 C), Min:97.4 F (36.3 C), Max:98.1 F (36.7 C)      on arrival  ED Triage Vitals [02/04/17 1427]  Enc Vitals Group     BP (!) 126/97     Pulse Rate (!) 119     Resp 18     Temp (!) 97.4 F (36.3 C)     Temp Source Oral     SpO2 98 %     Weight      Height      Head Circumference      Peak Flow      Pain Score 3     Pain Loc      Pain Edu?      Excl. in [place]?   RR 12 95% HR 81 BP 117/74 Troponin 0.01 sodium 1:30 9K4.0 range 0.68 WBC 6.6 hemoglobin 14.2 Patient initially was started on troponin while awaiting results of further imaging CT chest showed no PE but evidence of cardiomegaly with small bilateral effusions and pulmonary edema. Nodularity in the right apex unclear etiology will need to have follow-up Following Medications were ordered in ER: Medications  iopamidol (ISOVUE-370) 76 % injection (100 mLs  Contrast Given 02/04/17 1818)  heparin bolus via infusion 4,000 Units (4,000 Units Intravenous Bolus from Bag 02/04/17 1838)     ER provider discussed case with:  Cardiology who will see in AM  Hospitalist was called for admission for New diagnosis of cardiomegaly history of diastolic dysfunction in the past admitted for CHF exacerbation  Review of Systems:    Pertinent positives include: chest pressure, shortness of breath at rest.  dyspnea on exertion Constitutional:  No weight loss, night sweats, Fevers, chills, fatigue, weight loss  HEENT:  No headaches, Difficulty swallowing,Tooth/dental problems,Sore throat,  No sneezing, itching, ear ache, nasal congestion, post nasal  drip,  Cardio-vascular:  No  Orthopnea, PND, anasarca, dizziness, palpitations.no Bilateral lower extremity swelling  GI:  No heartburn, indigestion, abdominal pain, nausea, vomiting, diarrhea, change in bowel habits, loss of appetite, melena, blood in stool, hematemesis Resp:  no , No excess mucus, no productive cough, No non-productive cough, No coughing up of blood.No change in color of mucus.No wheezing. Skin:  no rash or lesions. No jaundice GU:  no dysuria, change in color of urine, no urgency or frequency. No straining to urinate.  No flank pain.  Musculoskeletal:  No joint pain or no joint swelling. No decreased range of  motion. No back pain.  Psych:  No change in mood or affect. No depression or anxiety. No memory loss.  Neuro: no localizing neurological complaints, no tingling, no weakness, no double vision, no gait abnormality, no slurred speech, no confusion  As per HPI otherwise 10 point review of systems negative.   Past Medical History: Past Medical History:  Diagnosis Date  . Arthritis    ostearthritis -joints, greater left knee.  . Breast cancer [facility])    surgery, radiation, chemotherapy.-no further oncology visits.  . Foot joint disorder    pain greater left foot ? related to retained hardware.  . Hyperlipidemia   . Neuromuscular disorder ([employer])    neuropathy due to chemotherapy  . Personal history of chemotherapy 2009  . Personal history of radiation therapy 2009  . SVT (supraventricular tachycardia) ([facility])    x1 episode -3 yrs ago- no issues since.All heart testing negative. No further cardiac visits needed.   Past Surgical History:  Procedure Laterality Date  . BREAST LUMPECTOMY Left 2009   sentinel node dissection  . BUNIONECTOMY     past has appointment to have foot check on -Pain presently more in left foot, ? related to retained hardware"  . CARDIAC CATHETERIZATION    . CHOLECYSTECTOMY     laparscopic  . DIAGNOSTIC LAPAROSCOPY     multiple for adhesions  . HAMMER TOE SURGERY    . KNEE ARTHROSCOPY Left 09/21/2015   Procedure: LEFT ARTHROSCOPY KNEE WITH LATERAL MENISCUS DEBRIDEMENT;  Surgeon: Gaynelle Arabian, MD;  Location: [facility]S;  Service: Orthopedics;  Laterality: Left;  . NASAL SINUS SURGERY  1979  . TOTAL ABDOMINAL HYSTERECTOMY  1984  . TRANSANAL RECTOPEXY       Social History:  Ambulatory  Independently     reports that she quit smoking about 27 years ago. Her smoking use included Cigarettes. She has a 20.00 pack-year smoking history. She has never used smokeless tobacco. She reports that she does not drink alcohol or use drugs.  Allergies:   Allergies  Allergen  Reactions  . Effexor [Venlafaxine] Other (See Comments)    Caused depression, negative thoughts, and crying   . Hydrocodone-Acetaminophen Itching and Nausea And Vomiting    Patient can tolerate with Zofran       Family History:   Family History  Problem Relation Age of Onset  . Heart disease Mother   . Hypertension Father   . Breast cancer Sister   . Lung cancer Sister   . Skin cancer Brother     Medications: Prior to Admission medications   Medication Sig Start Date End Date Taking? Authorizing Provider  Cholecalciferol (VITAMIN D-3 PO) Take 1 capsule by mouth daily.   Yes [provider]  ibuprofen (ADVIL,MOTRIN) 200 MG tablet Take 200-400 mg by mouth every 6 (six) hours as needed (for pain or muscle soreness).   Yes [provider]  Magnesium 250 MG TABS Take 250 mg by mouth daily.   Yes [provider]    Physical Exam: Patient Vitals for the past 24 hrs:  BP Temp Temp src Pulse Resp SpO2  02/04/17 1800 117/74 - - 81 12 95 %  02/04/17 1751 112/84 - - (!) 106 16 96 %  02/04/17 1715 104/72 - - (!) 105 15 96 %  02/04/17 1427 (!) 126/97 (!) 97.4 F (36.3 C) Oral (!) 119 18 98 %    1. General:  in No Acute distress 2. Psychological: Alert and  Oriented 3. Head/ENT:   Moist  Mucous Membranes                          Head Non traumatic, neck supple                           Poor Dentition 4. SKIN: normal   Skin turgor,  Skin clean Dry and intact no rash 5. Heart: Regular rate and rhythm no Murmur, Rub or gallop 6. Lungs:   no wheezes some bilateral  crackles   7. Abdomen: Soft,  non-tender, Non distended 8. Lower extremities: no clubbing, cyanosis, or edema 9. Neurologically Grossly intact, moving all 4 extremities equally   10. MSK: Normal range of motion   body mass index is unknown because there is no height or weight on file.  Labs on Admission:   Labs on Admission: I have personally reviewed following labs and imaging  studies  CBC:  Recent Labs Lab 02/04/17 1443  WBC 6.6  HGB 14.2  HCT 42.2  MCV 88.7  PLT 716   Basic Metabolic Panel:  Recent Labs Lab 02/04/17 1443  NA 139  K 4.0  CL 110  CO2 22  GLUCOSE 110*  BUN 15  CREATININE 0.68  CALCIUM 9.2   GFR: Estimated Creatinine Clearance: 63 mL/min (by C-G formula based on SCr of 0.68 mg/dL). Liver Function Tests: No results for input(s): AST, ALT, ALKPHOS, BILITOT, PROT, ALBUMIN in the last 168 hours. No results for input(s): LIPASE, AMYLASE in the last 168 hours. No results for input(s): AMMONIA in the last 168 hours. Coagulation Profile: No results for input(s): INR, PROTIME in the last 168 hours. Cardiac Enzymes: No results for input(s): CKTOTAL, CKMB, CKMBINDEX, TROPONINI in the last 168 hours. BNP (last 3 results) No results for input(s): PROBNP in the last 8760 hours. HbA1C: No results for input(s): HGBA1C in the last 72 hours. CBG: No results for input(s): GLUCAP in the last 168 hours. Lipid Profile: No results for input(s): CHOL, HDL, LDLCALC, TRIG, CHOLHDL, LDLDIRECT in the last 72 hours. Thyroid Function Tests: No results for input(s): TSH, T4TOTAL, FREET4, T3FREE, THYROIDAB in the last 72 hours. Anemia Panel: No results for input(s): VITAMINB12, FOLATE, FERRITIN, TIBC, IRON, RETICCTPCT in the last 72 hours. Urine analysis:  Sepsis Labs: @LABRCNTIP (procalcitonin:4,lacticidven:4) )No results found for this or any previous visit (from the past 240 hour(s)).     UA  ordered  No results found for: HGBA1C  Estimated Creatinine Clearance: 63 mL/min (by C-G formula based on SCr of 0.68 mg/dL).  BNP (last 3 results) No results for input(s): PROBNP in the last 8760 hours.   ECG REPORT  Independently reviewed Rate: 113  Rhythm: Trigeminy and left anterior fascicular block ST&T Change: No acute ischemic changes  QTC *510  There were no vitals filed for this visit.  Cultures: No results found for: SDES,  [place], CULT, REPTSTATUS   Radiological Exams on Admission: Dg Chest 2 View  Result Date: 02/04/2017 CLINICAL DATA:  Increased shortness of breath for the past 2 weeks. EXAM: CHEST  2 VIEW COMPARISON:  06/13/2010. FINDINGS: Interval enlarged cardiac silhouette and prominent pulmonary vasculature and interstitial markings with Kerley lines. There are also interval small bilateral pleural effusions. Mild thoracic spine degenerative changes. Cholecystectomy clips. IMPRESSION: Interval cardiomegaly and changes of congestive heart failure. Electronically Signed   By: Claudie Revering M.D.   On: 02/04/2017 17:46   Ct Angio Chest Pe W And/or Wo Contrast  Result Date: 02/04/2017 CLINICAL DATA:  Shortness of breath and chest tightness. Breast cancer 9 years ago. EXAM: CT ANGIOGRAPHY CHEST WITH CONTRAST TECHNIQUE: Multidetector CT imaging of the chest was performed using the standard protocol during bolus administration of intravenous contrast. Multiplanar CT image reconstructions and MIPs were obtained to evaluate the vascular anatomy. CONTRAST:  100 mL of Isovue 370 COMPARISON:  Chest x-ray from today.  Chest CT October 27, 2009 FINDINGS: Cardiovascular: Cardiomegaly is identified. Mild coronary artery calcifications. Evaluation of the thoracic aorta is limited due to timing of contrast but no aneurysm is seen. Atherosclerotic changes noted. No pulmonary emboli are identified. Mediastinum/Nodes: Small bilateral pleural effusions. No pericardial effusion. No adenopathy. The thyroid and esophagus are normal. Lungs/Pleura: Central airways are normal. No pneumothorax. Interlobular septal thickening, ground-glass, and bilateral pleural effusions are consistent with pulmonary edema. There is a flat nodular density in the medial right apex on series 6, image 27 not seen in 2011 measuring up to 6 mm. No other nodules. No masses. No evidence of pneumonia or aspiration. Upper Abdomen: No acute abnormality. Musculoskeletal: No  chest wall abnormality. No acute or significant osseous findings. Review of the MIP images confirms the above findings. IMPRESSION: 1. No pulmonary emboli. 2. Cardiomegaly, small bilateral effusions, and pulmonary edema. 3. Atherosclerotic change in the thoracic aorta. 4. Flat nodular density in the medial right apex may be part of the broader process. However, this finding was not seen in 2011 and the patient has a history of breast cancer. As a result, recommend a short-term follow-up in approximately 3-6 months after resolution of symptoms to ensure resolution. Aortic Atherosclerosis (ICD10-I70.0). Electronically Signed   By: Dorise Bullion III M.D   On: 02/04/2017 18:57    Chart has been reviewed    Assessment/Plan  71 y.o. female with medical history significant of breast cancer remote, history of diastolic dysfunction right foot hammertoe status recurrent procedures Admitted for New diagnosis of cardiomegaly history of diastolic dysfunction in the past admitted for CHF exacerbation never been on diuretics prior.    Present on Admission: . Abnormal EKG continue to monitor on telemetry cycle cardiac enzymes currently chest pain-free . Dyspnea on exertion - given new cardiomegaly and being. If patient suspect acute diastolic versus systolic procedure . Cardiomegaly - obtain echogram and cardiology consult in him check TSH and HIV status . Fluid overload mild  - likely secondary to CHF exacerbation patient has history of diastolic CHF but given severe dilation and cardiomegaly suspect possible systolic component currently blood pressures soft 90s to 100 range. We will avoid aggressive diuresis until echogram was obtained and patient is fully evaluated, suspect possibility of diminished cardiac output . Pulmonary nodule - patient is a former smoker will need follow-up as an outpatient repeat imaging in 3 to 4 monthes   Other plan as per orders.  DVT prophylaxis:  Lovenox     Code Status:   FULL CODE   as per patient    Family Communication:   Family not  at  Bedside    Disposition Plan:   To home once workup is complete and patient is stable                        Would benefit from PT/OT eval prior to DC   ordered                                                Consults called: Cardiology notified  Admission status:    inpatient       Level of care  tele        I have spent a total of 56 min on this admission Aradia Estey 02/05/2017, 1:05 AM    [facility]  Pager [phone]   after 2 AM please page floor coverage PA If 7AM-7PM, please contact the day team taking care of the patient  [email or web address]  Password [email or web address]

## 2017-02-04 NOTE — Progress Notes (Signed)
Pre visit review using our clinic review tool, if applicable. No additional management support is needed unless otherwise documented below in the visit note. 

## 2017-02-05 ENCOUNTER — Encounter (HOSPITAL_COMMUNITY): Payer: Self-pay | Admitting: General Practice

## 2017-02-05 ENCOUNTER — Inpatient Hospital Stay (HOSPITAL_COMMUNITY): Payer: 59

## 2017-02-05 DIAGNOSIS — I340 Nonrheumatic mitral (valve) insufficiency: Secondary | ICD-10-CM

## 2017-02-05 DIAGNOSIS — I5021 Acute systolic (congestive) heart failure: Secondary | ICD-10-CM

## 2017-02-05 DIAGNOSIS — I5032 Chronic diastolic (congestive) heart failure: Secondary | ICD-10-CM

## 2017-02-05 LAB — URINALYSIS, ROUTINE W REFLEX MICROSCOPIC
BILIRUBIN URINE: NEGATIVE
Bacteria, UA: NONE SEEN
Glucose, UA: NEGATIVE mg/dL
Ketones, ur: NEGATIVE mg/dL
Leukocytes, UA: NEGATIVE
NITRITE: NEGATIVE
PH: 5 (ref 5.0–8.0)
Protein, ur: NEGATIVE mg/dL
SPECIFIC GRAVITY, URINE: 1.024 (ref 1.005–1.030)

## 2017-02-05 LAB — CBC
HCT: 39.8 % (ref 36.0–46.0)
HEMOGLOBIN: 13.4 g/dL (ref 12.0–15.0)
MCH: 29.8 pg (ref 26.0–34.0)
MCHC: 33.7 g/dL (ref 30.0–36.0)
MCV: 88.4 fL (ref 78.0–100.0)
PLATELETS: 160 10*3/uL (ref 150–400)
RBC: 4.5 MIL/uL (ref 3.87–5.11)
RDW: 13.7 % (ref 11.5–15.5)
WBC: 5.6 10*3/uL (ref 4.0–10.5)

## 2017-02-05 LAB — HIV ANTIBODY (ROUTINE TESTING W REFLEX): HIV Screen 4th Generation wRfx: NONREACTIVE

## 2017-02-05 LAB — ECHOCARDIOGRAM COMPLETE
Height: 64 in
Weight: 2338 oz

## 2017-02-05 LAB — TSH: TSH: 3.421 u[IU]/mL (ref 0.350–4.500)

## 2017-02-05 LAB — TROPONIN I

## 2017-02-05 MED ORDER — SODIUM CHLORIDE 0.9 % IV SOLN
250.0000 mL | INTRAVENOUS | Status: DC | PRN
Start: 2017-02-05 — End: 2017-02-07

## 2017-02-05 MED ORDER — SPIRONOLACTONE 25 MG PO TABS
12.5000 mg | ORAL_TABLET | Freq: Every day | ORAL | Status: DC
  Administered 2017-02-05 – 2017-02-07 (×3): 12.5 mg via ORAL
  Filled 2017-02-05 (×3): qty 1

## 2017-02-05 MED ORDER — ENOXAPARIN SODIUM 40 MG/0.4ML ~~LOC~~ SOLN
40.0000 mg | SUBCUTANEOUS | Status: DC
  Administered 2017-02-05: 40 mg via SUBCUTANEOUS
  Filled 2017-02-05: qty 0.4

## 2017-02-05 MED ORDER — ACETAMINOPHEN 325 MG PO TABS
650.0000 mg | ORAL_TABLET | ORAL | Status: DC | PRN
  Administered 2017-02-05: 650 mg via ORAL
  Filled 2017-02-05: qty 2

## 2017-02-05 MED ORDER — SODIUM CHLORIDE 0.9% FLUSH
3.0000 mL | Freq: Two times a day (BID) | INTRAVENOUS | Status: DC
  Administered 2017-02-05 – 2017-02-06 (×3): 3 mL via INTRAVENOUS

## 2017-02-05 MED ORDER — ONDANSETRON HCL 4 MG/2ML IJ SOLN: 4.0000 mg | Freq: Four times a day (QID) | INTRAMUSCULAR | Status: DC | PRN

## 2017-02-05 MED ORDER — FUROSEMIDE 10 MG/ML IJ SOLN
40.0000 mg | Freq: Two times a day (BID) | INTRAMUSCULAR | Status: DC
  Administered 2017-02-05 – 2017-02-06 (×3): 40 mg via INTRAVENOUS
  Filled 2017-02-05 (×3): qty 4

## 2017-02-05 MED ORDER — DIGOXIN 125 MCG PO TABS
0.1250 mg | ORAL_TABLET | Freq: Every day | ORAL | Status: DC
  Administered 2017-02-05 – 2017-02-07 (×3): 0.125 mg via ORAL
  Filled 2017-02-05 (×3): qty 1

## 2017-02-05 MED ORDER — SODIUM CHLORIDE 0.9% FLUSH: 3.0000 mL | INTRAVENOUS | Status: DC | PRN

## 2017-02-05 NOTE — ED Notes (Signed)
Heparin verified with Johnney Ou

## 2017-02-05 NOTE — ED Notes (Signed)
Pt. Ambulated to restroom with steady gait.  

## 2017-02-05 NOTE — Consult Note (Signed)
Advanced Heart Failure Team Consult Note   Primary Physician: Primary Cardiologist:  Dr Haroldine Laws Oncologist: Dr Jana Hakim  Reason for Consultation: Heart Failure   HPI:    Patricia Collier is seen today for evaluation of dyspnea and heart failure  at the request of Dr Darnell Level.   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 She has been very active and continues to work as Immunologist at [employer].   On June 22nd she had foot surgery and has not felt well since that time. SOB with steps and with exertion. Yesterday she saw Dr Birdie Riddle and had tachycardia and mild hypoxia so she was sent to [facility] ED for further evaluation.   She has not had recent viral illness.   Admitted with increased dyspnea. Pertinent admission labs include creatinine 0.68, BNP 1065, troponin 0.03, and hgb 14.2. CXR concerning for HF. CT negative for PE and with new nodule R medial apex. Repeat ECHO pending. SBP running in the 80s but typically in the 120s.   ECHO 2016 - EF 55%  Grade IDD.   Review of Systems: [y] = yes, [ ]  = no   General: Weight gain [ ] ; Weight loss [ ] ; Anorexia [ ] ; Fatigue [ Y]; Fever [ ] ; Chills [ ] ; Weakness [ ]   Cardiac: Chest pain/pressure [ ] ; Resting SOB [ ] ; Exertional SOB [Y ]; Orthopnea [ ] ; Pedal Edema [ ] ; Palpitations [ ] ; Syncope [ ] ; Presyncope [ ] ; Paroxysmal nocturnal dyspnea[ ]   Pulmonary: Cough [ ] ; Wheezing[ ] ; Hemoptysis[ ] ; Sputum [ ] ; Snoring [ ]   GI: Vomiting[ ] ; Dysphagia[ ] ; Melena[ ] ; Hematochezia [ ] ; Heartburn[ ] ; Abdominal pain [ ] ; Constipation [ ] ; Diarrhea [ ] ; BRBPR [ ]   GU: Hematuria[ ] ; Dysuria [ ] ; Nocturia[ ]   Vascular: Pain in legs with walking [ ] ; Pain in feet with lying flat [ ] ; Non-healing sores [ ] ; Stroke [ ] ; TIA [ ] ; Slurred speech [ ] ;  Neuro:  Headaches[ ] ; Vertigo[ ] ; Seizures[ ] ; Paresthesias[ ] ;Blurred vision [ ] ; Diplopia [ ] ; Vision changes [ ]   Ortho/Skin: Arthritis [ ] ; Joint pain [Y ]; Muscle pain [ ] ; Joint swelling [ ] ; Back Pain [ ] ; Rash [ ]   Psych: Depression[ ] ; Anxiety[ ]   Heme: Bleeding problems [ ] ; Clotting disorders [ ] ; Anemia [ ]   Endocrine: Diabetes [ ] ; Thyroid dysfunction[ ]   Home Medications Prior to Admission medications   Medication Sig Start Date End Date Taking? Authorizing Provider  Cholecalciferol (VITAMIN D-3 PO) Take 1 capsule by mouth daily.   Yes [provider]  ibuprofen (ADVIL,MOTRIN) 200 MG tablet Take 200-400 mg by mouth every 6 (six) hours as needed (for pain or muscle soreness).   Yes [provider]  Magnesium 250 MG TABS Take 250 mg by mouth daily.   Yes [provider]    Past Medical History: Past Medical History:  Diagnosis Date  . Arthritis    ostearthritis -joints, greater left knee.  . Breast cancer [facility])    surgery, radiation, chemotherapy.-no further oncology visits.  . Foot joint disorder    pain greater left foot ? related to retained hardware.  . Hyperlipidemia   . Neuromuscular disorder ([place])    neuropathy due to chemotherapy  . Personal history of chemotherapy 2009  .  Personal history of radiation therapy 2009  . SVT (supraventricular tachycardia) (HCC)    x1 episode -3 yrs ago- no issues since.All heart testing negative. No further cardiac visits needed.    Past Surgical History: Past Surgical History:  Procedure Laterality Date  . BREAST LUMPECTOMY Left 2009   sentinel node dissection  . BUNIONECTOMY     past has appointment to have foot check on -Pain presently more in left foot, ? related to retained hardware"  . CARDIAC CATHETERIZATION    . CHOLECYSTECTOMY     laparscopic  . DIAGNOSTIC LAPAROSCOPY     multiple for adhesions  . HAMMER TOE SURGERY    . KNEE ARTHROSCOPY Left 09/21/2015   Procedure: LEFT ARTHROSCOPY KNEE WITH  LATERAL MENISCUS DEBRIDEMENT;  Surgeon: Gaynelle Arabian, MD;  Location: [facility] ORS;  Service: Orthopedics;  Laterality: Left;  . NASAL SINUS SURGERY  1979  . TOTAL ABDOMINAL HYSTERECTOMY  1984  . TRANSANAL RECTOPEXY      Family History: Family History  Problem Relation Age of Onset  . Heart disease Mother   . Hypertension Father   . Breast cancer Sister   . Lung cancer Sister   . Skin cancer Brother     Social History: Social History   Social History  . Marital status: Married    Spouse name: N/A  . Number of children: N/A  . Years of education: N/A   Social History Main Topics  . Smoking status: Former Smoker    Packs/day: 2.00    Years: 10.00    Types: Cigarettes    Quit date: 07/09/1989  . Smokeless tobacco: Never Used     Comment: 2 ys ago.  . Alcohol use No  . Drug use: No  . Sexual activity: Not Asked   Other Topics Concern  . None   Social History Narrative  . None    Allergies:  Allergies  Allergen Reactions  . Effexor [Venlafaxine] Other (See Comments)    Caused depression, negative thoughts, and crying   . Hydrocodone-Acetaminophen Itching and Nausea And Vomiting    Patient can tolerate with Zofran    Objective:    Vital Signs:   Temp:  [97.4 F (36.3 C)] 97.4 F (36.3 C) (07/30 1427) Pulse Rate:  [81-119] 95 (07/31 0715) Resp:  [12-24] 15 (07/31 0715) BP: (90-126)/(59-97) 101/65 (07/31 0715) SpO2:  [91 %-100 %] 100 % (07/31 0715)    Weight change: There were no vitals filed for this visit.  Intake/Output:   Intake/Output Summary (Last 24 hours) at 02/05/17 1049 Last data filed at 02/04/17 2052  Gross per 24 hour  Intake            24.15 ml  Output                0 ml  Net            24.15 ml      Physical Exam    General:  Well appearing. No resp difficulty. In bed  HEENT: normal Neck: supple. JVP 7-8  . Carotids 2+ bilat; no bruits. No lymphadenopathy or thyromegaly appreciated. Cor: PMI nondisplaced. Tachy regular  No rubs,  gallops. 2-3+ MR Lungs: clear. No wheeze Abdomen: soft, nontender, nondistended. No hepatosplenomegaly. No bruits or masses. Good bowel sounds. Extremities: no cyanosis, clubbing, rash, R and LLE trace edema Neuro: alert & orientedx3, cranial nerves grossly intact. moves all 4 extremities w/o difficulty. Affect pleasant   Telemetry   Sinus Tach 100s +PVCs  EKG    SInus Tach with frequent PVCs 113 bpm  Labs   Basic Metabolic Panel:  Recent Labs Lab 02/04/17 1443  NA 139  K 4.0  CL 110  CO2 22  GLUCOSE 110*  BUN 15  CREATININE 0.68  CALCIUM 9.2    Liver Function Tests: No results for input(s): AST, ALT, ALKPHOS, BILITOT, PROT, ALBUMIN in the last 168 hours. No results for input(s): LIPASE, AMYLASE in the last 168 hours. No results for input(s): AMMONIA in the last 168 hours.  CBC:  Recent Labs Lab 02/04/17 1443 02/05/17 0259  WBC 6.6 5.6  HGB 14.2 13.4  HCT 42.2 39.8  MCV 88.7 88.4  PLT 193 160    Cardiac Enzymes:  Recent Labs Lab 02/04/17 2054 02/05/17 0259 02/05/17 0750  TROPONINI <0.03 <0.03 <0.03    BNP: BNP (last 3 results)  Recent Labs  02/04/17 2054  BNP 1,065.2*    ProBNP (last 3 results) No results for input(s): PROBNP in the last 8760 hours.   CBG: No results for input(s): GLUCAP in the last 168 hours.  Coagulation Studies: No results for input(s): LABPROT, INR in the last 72 hours.   Imaging   Dg Chest 2 View  Result Date: 02/04/2017 CLINICAL DATA:  Increased shortness of breath for the past 2 weeks. EXAM: CHEST  2 VIEW COMPARISON:  06/13/2010. FINDINGS: Interval enlarged cardiac silhouette and prominent pulmonary vasculature and interstitial markings with Kerley lines. There are also interval small bilateral pleural effusions. Mild thoracic spine degenerative changes. Cholecystectomy clips. IMPRESSION: Interval cardiomegaly and changes of congestive heart failure. Electronically Signed   By: Claudie Revering M.D.   On:  02/04/2017 17:46   Ct Angio Chest Pe W And/or Wo Contrast  Result Date: 02/04/2017 CLINICAL DATA:  Shortness of breath and chest tightness. Breast cancer 9 years ago. EXAM: CT ANGIOGRAPHY CHEST WITH CONTRAST TECHNIQUE: Multidetector CT imaging of the chest was performed using the standard protocol during bolus administration of intravenous contrast. Multiplanar CT image reconstructions and MIPs were obtained to evaluate the vascular anatomy. CONTRAST:  100 mL of Isovue 370 COMPARISON:  Chest x-ray from today.  Chest CT October 27, 2009 FINDINGS: Cardiovascular: Cardiomegaly is identified. Mild coronary artery calcifications. Evaluation of the thoracic aorta is limited due to timing of contrast but no aneurysm is seen. Atherosclerotic changes noted. No pulmonary emboli are identified. Mediastinum/Nodes: Small bilateral pleural effusions. No pericardial effusion. No adenopathy. The thyroid and esophagus are normal. Lungs/Pleura: Central airways are normal. No pneumothorax. Interlobular septal thickening, ground-glass, and bilateral pleural effusions are consistent with pulmonary edema. There is a flat nodular density in the medial right apex on series 6, image 27 not seen in 2011 measuring up to 6 mm. No other nodules. No masses. No evidence of pneumonia or aspiration. Upper Abdomen: No acute abnormality. Musculoskeletal: No chest wall abnormality. No acute or significant osseous findings. Review of the MIP images confirms the above findings. IMPRESSION: 1. No pulmonary emboli. 2. Cardiomegaly, small bilateral effusions, and pulmonary edema. 3. Atherosclerotic change in the thoracic aorta. 4. Flat nodular density in the medial right apex may be part of the broader process. However, this finding was not seen in 2011 and the patient has a history of breast cancer. As a result, recommend a short-term follow-up in approximately 3-6 months after resolution of symptoms to ensure resolution. Aortic Atherosclerosis  (ICD10-I70.0). Electronically Signed   By: Dorise Bullion III M.D   On: 02/04/2017 18:57      Medications:  Current Medications: . enoxaparin (LOVENOX) injection  40 mg Subcutaneous Q24H  . sodium chloride flush  3 mL Intravenous Q12H     Infusions: . sodium chloride         Patient Profile  Patricia Collier is 71 year old with history of breast cancer treated with doxorubicin in 8032, diastolic heart failure admitted with increased dyspnea and new systolic heart failure.   Assessment/Plan   1. Dyspnea---> New Acute Systolic Heart Failure Bedside ECHO today by Dr Haroldine Laws. EF ~20% with MR.  ? Chemo induced. TSH 3.24. No recent virus. Troponin low. Will need RHC/LHC tomorrow. May need intropes but will see the results of RHC.  Formal ECHO today . Mild volume overload. Start lasix 40 mg twice a day. Add digoxin 0.125 mg dialy.  Add 12.5 mg spiro daily.   2. MR - formal echo today.  3. PVCs 3.H/O Breast Cancer- treated with doxorubicin in 2009  5. R pulmonary nodule    Length of Stay: 1  Amy Clegg, NP  02/05/2017, 10:49 AM  Advanced Heart Failure Team Pager [phone] (M-F; [place])  Please contact [facility] for night-coverage after hours (4p -7a ) and weekends on [email or web address]  Patient seen and examined with Darrick Grinder, NP. We discussed all aspects of the encounter. I agree with the assessment and plan as stated above.   She has acute onset of systolic HF NYHA III-IIIb symptoms. Bedside echo shows EF 20% with moderate MR. RV normal. BP low. Possible low output.   ? Adriamycin toxicity vs ischemic vs PVC related vs valvular   Will get formal echo to better evaluate MV  Start IV lasix, Spiro and digoxin. Plan R?L cath tomorrow.   Glori Bickers, MD  12:10 PM

## 2017-02-05 NOTE — Progress Notes (Signed)
PROGRESS NOTE  Patricia Collier YIA:[id number] DOB: Jun 18, 1946 DOA: 02/04/2017 PCP: [name], MD  HPI/Recap of past 77 hours: 71 year old female with past medical history of breast cancer almost 10 years ago treated with doxorubicin plus diastolic heart failure who following toe surgery 5 weeks ago had noted that she started having increased dyspnea on exertion, occasional episodes of tachycardia. She occasionally had an rare nonproductive cough, but otherwise noted this persistent shortness of breath with activity. Symptoms became worse in the last few days she came into the emergency room.  Pulmonary emboli ruled out, but CT scan noted cardiomegaly with small bilateral effusions. Patient's oxygen saturations were in the low 90s, unusual for her. Initially Lasix held due to softer blood pressures, but started this morning.  Seen by cardiology and patient underwent echocardiogram, final results pending. However bedside read by cardiology notes ejection fraction of 20%. Plans are for right and left heart cath tomorrow. Patient herself doing okay. Patricia Collier. Does not have any dyspnea while at rest.  Assessment/Plan: Active Problems:   Dyspnea on exertion   Abnormal EKG:     Pulmonary nodule: Flat nodular density in the medial right apex noted on CT. Not seen in 2011. Recommendation for repeat CT in 3-6 months.   Acute systolic heart failure in the setting of chronic diastolic heart failure: IV Lasix. If final report confirms significant decreased ejection fraction, certainly concerning. Could be related to chemotherapy from patient's previous history of breast cancer. For right heart cath tomorrow.  History of breast cancer.   Code Status: Full code  Family Communication: Husband at the bedside   Disposition Plan: Depending on final results of echo plus cardiac cath, likely will be here for several days    Consultants:  Cardiology  Procedures:  Echo done  7/31: Results pending  Right and left heart cath planned for 8/1   Antimicrobials:  None   DVT prophylaxis: Lovenox   Objective: Vitals:   02/05/17 0400 02/05/17 0519 02/05/17 0715 02/05/17 1300  BP: 94/63 108/68 101/65   Pulse: 95 90 95   Resp: (!) 24 20 15    Temp:    98 F (36.7 C)  TempSrc:    Oral  SpO2: 96% 99% 100% 92%    Intake/Output Summary (Last 24 hours) at 02/05/17 1323 Last data filed at 02/04/17 2052  Gross per 24 hour  Intake            24.15 ml  Output                0 ml  Net            24.15 ml   There were no vitals filed for this visit.  Exam:   General:  Alert and oriented 3, no acute distress  HEENT: Normocephalic, Atraumatic, mucous members are moist  Neck: Supple, no carotid bruits   Cardiovascular: Regular rate and rhythm, S1-S2, very soft 2/6 systolic ejection murmur   Respiratory: Clear to auscultation bilaterally, no crackles or wheezes   Abdomen: Soft, nontender, nondistended, positive bowel sounds   Musculoskeletal: No clubbing or cyanosis or edema  Neuro: No focal deficits   Skin: No Skin breaks, tears or lesions  Psychiatry: Patient is appropriate, no evidence of psychoses    Data Reviewed: CBC:  Recent Labs Lab 02/04/17 1443 02/05/17 0259  WBC 6.6 5.6  HGB 14.2 13.4  HCT 42.2 39.8  MCV 88.7 88.4  PLT 193 160   Basic  Metabolic Panel:  Recent Labs Lab 02/04/17 1443  NA 139  K 4.0  CL 110  CO2 22  GLUCOSE 110*  BUN 15  CREATININE 0.68  CALCIUM 9.2   GFR: Estimated Creatinine Clearance: 63 mL/min (by C-G formula based on SCr of 0.68 mg/dL). Liver Function Tests: No results for input(s): AST, ALT, ALKPHOS, BILITOT, PROT, ALBUMIN in the last 168 hours. No results for input(s): LIPASE, AMYLASE in the last 168 hours. No results for input(s): AMMONIA in the last 168 hours. Coagulation Profile: No results for input(s): INR, PROTIME in the last 168 hours. Cardiac Enzymes:  Recent Labs Lab  02/04/17 2054 02/05/17 0259 02/05/17 0750  TROPONINI <0.03 <0.03 <0.03   BNP (last 3 results) No results for input(s): PROBNP in the last 8760 hours. HbA1C: No results for input(s): HGBA1C in the last 72 hours. CBG: No results for input(s): GLUCAP in the last 168 hours. Lipid Profile: No results for input(s): CHOL, HDL, LDLCALC, TRIG, CHOLHDL, LDLDIRECT in the last 72 hours. Thyroid Function Tests:  Recent Labs  02/05/17 0259  TSH 3.421   Anemia Panel: No results for input(s): VITAMINB12, FOLATE, FERRITIN, TIBC, IRON, RETICCTPCT in the last 72 hours. Urine analysis:    Component Value Date/Time   COLORURINE YELLOW 02/04/2017 2333   APPEARANCEUR CLEAR 02/04/2017 2333   LABSPEC 1.024 02/04/2017 2333   LABSPEC 1.015 01/19/2008 1209   PHURINE 5.0 02/04/2017 2333   GLUCOSEU NEGATIVE 02/04/2017 2333   HGBUR SMALL (A) 02/04/2017 2333   BILIRUBINUR NEGATIVE 02/04/2017 2333   BILIRUBINUR negative 06/21/2014 1641   BILIRUBINUR Negative 01/19/2008 [place] 02/04/2017 2333   PROTEINUR NEGATIVE 02/04/2017 2333   UROBILINOGEN 2.0 06/21/2014 1641   UROBILINOGEN 0.2 10/16/2007 0859   NITRITE NEGATIVE 02/04/2017 2333   LEUKOCYTESUR NEGATIVE 02/04/2017 2333   LEUKOCYTESUR Negative 01/19/2008 1209      Studies: Dg Chest 2 View  Result Date: 02/04/2017 CLINICAL DATA:  Increased shortness of breath for the past 2 weeks. EXAM: CHEST  2 VIEW COMPARISON:  06/13/2010. FINDINGS: Interval enlarged cardiac silhouette and prominent pulmonary vasculature and interstitial markings with Kerley lines. There are also interval small bilateral pleural effusions. Mild thoracic spine degenerative changes. Cholecystectomy clips. IMPRESSION: Interval cardiomegaly and changes of congestive heart failure. Electronically Signed   By: Claudie Revering M.D.   On: 02/04/2017 17:46   Ct Angio Chest Pe W And/or Wo Contrast  Result Date: 02/04/2017 CLINICAL DATA:  Shortness of breath and chest  tightness. Breast cancer 9 years ago. EXAM: CT ANGIOGRAPHY CHEST WITH CONTRAST TECHNIQUE: Multidetector CT imaging of the chest was performed using the standard protocol during bolus administration of intravenous contrast. Multiplanar CT image reconstructions and MIPs were obtained to evaluate the vascular anatomy. CONTRAST:  100 mL of Isovue 370 COMPARISON:  Chest x-ray from today.  Chest CT October 27, 2009 FINDINGS: Cardiovascular: Cardiomegaly is identified. Mild coronary artery calcifications. Evaluation of the thoracic aorta is limited due to timing of contrast but no aneurysm is seen. Atherosclerotic changes noted. No pulmonary emboli are identified. Mediastinum/Nodes: Small bilateral pleural effusions. No pericardial effusion. No adenopathy. The thyroid and esophagus are normal. Lungs/Pleura: Central airways are normal. No pneumothorax. Interlobular septal thickening, ground-glass, and bilateral pleural effusions are consistent with pulmonary edema. There is a flat nodular density in the medial right apex on series 6, image 27 not seen in 2011 measuring up to 6 mm. No other nodules. No masses. No evidence of pneumonia or aspiration. Upper Abdomen: No acute abnormality.  Musculoskeletal: No chest wall abnormality. No acute or significant osseous findings. Review of the MIP images confirms the above findings. IMPRESSION: 1. No pulmonary emboli. 2. Cardiomegaly, small bilateral effusions, and pulmonary edema. 3. Atherosclerotic change in the thoracic aorta. 4. Flat nodular density in the medial right apex may be part of the broader process. However, this finding was not seen in 2011 and the patient has a history of breast cancer. As a result, recommend a short-term follow-up in approximately 3-6 months after resolution of symptoms to ensure resolution. Aortic Atherosclerosis (ICD10-I70.0). Electronically Signed   By: Dorise Bullion III M.D   On: 02/04/2017 18:57    Scheduled Meds: . digoxin  0.125 mg Oral  Daily  . enoxaparin (LOVENOX) injection  40 mg Subcutaneous Q24H  . furosemide  40 mg Intravenous BID  . sodium chloride flush  3 mL Intravenous Q12H    Continuous Infusions: . sodium chloride       LOS: 1 day     Annita Brod, MD [facility] Pager [phone]  If 7PM-7AM, please contact night-coverage [email or web address] Password [email or web address] 02/05/2017, 1:23 PM

## 2017-02-05 NOTE — Progress Notes (Signed)
  Echocardiogram 2D Echocardiogram has been performed.  Patricia Collier 02/05/2017, 2:11 PM

## 2017-02-06 ENCOUNTER — Encounter (HOSPITAL_COMMUNITY): Payer: Self-pay | Admitting: Internal Medicine

## 2017-02-06 ENCOUNTER — Ambulatory Visit: Payer: 59 | Admitting: Family Medicine

## 2017-02-06 ENCOUNTER — Encounter (HOSPITAL_COMMUNITY)
Admission: EM | Disposition: A | Discharge status: Home / Self Care | Payer: Self-pay | Source: Home / Self Care | Attending: Internal Medicine

## 2017-02-06 DIAGNOSIS — I509 Heart failure, unspecified: Secondary | ICD-10-CM

## 2017-02-06 HISTORY — PX: RIGHT/LEFT HEART CATH AND CORONARY ANGIOGRAPHY: CATH118266

## 2017-02-06 LAB — POCT I-STAT 3, VENOUS BLOOD GAS (G3P V)
BICARBONATE: 25 mmol/L (ref 20.0–28.0)
Bicarbonate: 24.2 mmol/L (ref 20.0–28.0)
O2 Saturation: 58 %
O2 Saturation: 58 %
PCO2 VEN: 38.6 mmHg — AB (ref 44.0–60.0)
PH VEN: 7.405 (ref 7.250–7.430)
PH VEN: 7.411 (ref 7.250–7.430)
PO2 VEN: 30 mmHg — AB (ref 32.0–45.0)
PO2 VEN: 30 mmHg — AB (ref 32.0–45.0)
TCO2: 25 mmol/L (ref 0–100)
TCO2: 26 mmol/L (ref 0–100)
pCO2, Ven: 39.4 mmHg — ABNORMAL LOW (ref 44.0–60.0)

## 2017-02-06 LAB — CBC
HEMATOCRIT: 43.7 % (ref 36.0–46.0)
HEMOGLOBIN: 15 g/dL (ref 12.0–15.0)
MCH: 29.7 pg (ref 26.0–34.0)
MCHC: 34.3 g/dL (ref 30.0–36.0)
MCV: 86.5 fL (ref 78.0–100.0)
Platelets: 192 10*3/uL (ref 150–400)
RBC: 5.05 MIL/uL (ref 3.87–5.11)
RDW: 13.6 % (ref 11.5–15.5)
WBC: 5.1 10*3/uL (ref 4.0–10.5)

## 2017-02-06 LAB — POCT I-STAT 3, ART BLOOD GAS (G3+)
ACID-BASE DEFICIT: 1 mmol/L (ref 0.0–2.0)
BICARBONATE: 22.5 mmol/L (ref 20.0–28.0)
O2 Saturation: 94 %
PH ART: 7.45 (ref 7.350–7.450)
PO2 ART: 65 mmHg — AB (ref 83.0–108.0)
TCO2: 23 mmol/L (ref 0–100)
pCO2 arterial: 32.4 mmHg (ref 32.0–48.0)

## 2017-02-06 LAB — PROTIME-INR
INR: 1.17
Prothrombin Time: 14.9 seconds (ref 11.4–15.2)

## 2017-02-06 LAB — BASIC METABOLIC PANEL
Anion gap: 8 (ref 5–15)
BUN: 17 mg/dL (ref 6–20)
CHLORIDE: 106 mmol/L (ref 101–111)
CO2: 23 mmol/L (ref 22–32)
CREATININE: 0.75 mg/dL (ref 0.44–1.00)
Calcium: 9.2 mg/dL (ref 8.9–10.3)
GFR calc non Af Amer: >60 mL/min (ref >60)
Glucose, Bld: 105 mg/dL — ABNORMAL HIGH (ref 65–99)
Potassium: 3.4 mmol/L — ABNORMAL LOW (ref 3.5–5.1)
Sodium: 137 mmol/L (ref 135–145)

## 2017-02-06 LAB — CARDIAC CATHETERIZATION: CATHEFQUANT: 20 %

## 2017-02-06 SURGERY — RIGHT/LEFT HEART CATH AND CORONARY ANGIOGRAPHY
Anesthesia: LOCAL

## 2017-02-06 MED ORDER — MIDAZOLAM HCL 2 MG/2ML IJ SOLN
INTRAMUSCULAR | Status: AC
  Filled 2017-02-06: qty 2

## 2017-02-06 MED ORDER — MIDAZOLAM HCL 2 MG/2ML IJ SOLN
INTRAMUSCULAR | Status: DC | PRN
  Administered 2017-02-06: 1 mg via INTRAVENOUS

## 2017-02-06 MED ORDER — SODIUM CHLORIDE 0.9 % IV SOLN: INTRAVENOUS | Status: AC

## 2017-02-06 MED ORDER — ASPIRIN 81 MG PO CHEW
81.0000 mg | CHEWABLE_TABLET | ORAL | Status: AC
  Administered 2017-02-06: 81 mg via ORAL
  Filled 2017-02-06: qty 1

## 2017-02-06 MED ORDER — VERAPAMIL HCL 2.5 MG/ML IV SOLN
INTRAVENOUS | Status: AC
  Filled 2017-02-06: qty 2

## 2017-02-06 MED ORDER — SODIUM CHLORIDE 0.9 % IV SOLN: 250.0000 mL | INTRAVENOUS | Status: DC | PRN

## 2017-02-06 MED ORDER — ACETAMINOPHEN 325 MG PO TABS: 650.0000 mg | ORAL_TABLET | ORAL | Status: DC | PRN

## 2017-02-06 MED ORDER — ONDANSETRON HCL 4 MG/2ML IJ SOLN: 4.0000 mg | Freq: Four times a day (QID) | INTRAMUSCULAR | Status: DC | PRN

## 2017-02-06 MED ORDER — FENTANYL CITRATE (PF) 100 MCG/2ML IJ SOLN
INTRAMUSCULAR | Status: AC
  Filled 2017-02-06: qty 2

## 2017-02-06 MED ORDER — ENOXAPARIN SODIUM 40 MG/0.4ML ~~LOC~~ SOLN
40.0000 mg | SUBCUTANEOUS | Status: DC
  Filled 2017-02-06: qty 0.4

## 2017-02-06 MED ORDER — HEPARIN SODIUM (PORCINE) 1000 UNIT/ML IJ SOLN
INTRAMUSCULAR | Status: AC
  Filled 2017-02-06: qty 1

## 2017-02-06 MED ORDER — FENTANYL CITRATE (PF) 100 MCG/2ML IJ SOLN
INTRAMUSCULAR | Status: DC | PRN
  Administered 2017-02-06: 25 ug via INTRAVENOUS

## 2017-02-06 MED ORDER — POTASSIUM CHLORIDE CRYS ER 20 MEQ PO TBCR
40.0000 meq | EXTENDED_RELEASE_TABLET | Freq: Once | ORAL | Status: AC
  Administered 2017-02-06: 40 meq via ORAL
  Filled 2017-02-06: qty 2

## 2017-02-06 MED ORDER — HEPARIN (PORCINE) IN NACL 2-0.9 UNIT/ML-% IJ SOLN
INTRAMUSCULAR | Status: AC | PRN
  Administered 2017-02-06: 1000 mL

## 2017-02-06 MED ORDER — SODIUM CHLORIDE 0.9% FLUSH: 3.0000 mL | INTRAVENOUS | Status: DC | PRN

## 2017-02-06 MED ORDER — SODIUM CHLORIDE 0.9 % IV SOLN
INTRAVENOUS | Status: AC | PRN
  Administered 2017-02-06: 10 mL/h via INTRAVENOUS

## 2017-02-06 MED ORDER — SODIUM CHLORIDE 0.9 % IV SOLN: INTRAVENOUS | Status: DC

## 2017-02-06 MED ORDER — SODIUM CHLORIDE 0.9% FLUSH
3.0000 mL | Freq: Two times a day (BID) | INTRAVENOUS | Status: DC
  Administered 2017-02-06: 3 mL via INTRAVENOUS

## 2017-02-06 MED ORDER — LIDOCAINE HCL (PF) 1 % IJ SOLN
INTRAMUSCULAR | Status: DC | PRN
  Administered 2017-02-06 (×2): 2 mL

## 2017-02-06 MED ORDER — SODIUM CHLORIDE 0.9% FLUSH: 3.0000 mL | Freq: Two times a day (BID) | INTRAVENOUS | Status: DC

## 2017-02-06 MED ORDER — ASPIRIN 81 MG PO CHEW: 81.0000 mg | CHEWABLE_TABLET | ORAL | Status: DC

## 2017-02-06 MED ORDER — HEPARIN (PORCINE) IN NACL 2-0.9 UNIT/ML-% IJ SOLN
INTRAMUSCULAR | Status: AC
  Filled 2017-02-06: qty 1000

## 2017-02-06 MED ORDER — VERAPAMIL HCL 2.5 MG/ML IV SOLN
INTRAVENOUS | Status: DC | PRN
  Administered 2017-02-06: 10 mL via INTRA_ARTERIAL

## 2017-02-06 MED ORDER — LIDOCAINE HCL (PF) 1 % IJ SOLN
INTRAMUSCULAR | Status: AC
  Filled 2017-02-06: qty 30

## 2017-02-06 MED ORDER — IOPAMIDOL (ISOVUE-370) INJECTION 76%
INTRAVENOUS | Status: DC | PRN
Start: 2017-02-06 — End: 2017-02-06
  Administered 2017-02-06: 25 mL via INTRA_ARTERIAL

## 2017-02-06 MED ORDER — HEPARIN SODIUM (PORCINE) 1000 UNIT/ML IJ SOLN
INTRAMUSCULAR | Status: DC | PRN
Start: 2017-02-06 — End: 2017-02-06
  Administered 2017-02-06: 3000 [IU] via INTRAVENOUS

## 2017-02-06 SURGICAL SUPPLY — 12 items
CATH BALLN WEDGE 5F 110CM (CATHETERS) ×1 IMPLANT
CATH EXPO 5F FL3.5 (CATHETERS) ×1 IMPLANT
CATH INFINITI JR4 5F (CATHETERS) ×1 IMPLANT
DEVICE RAD COMP TR BAND LRG (VASCULAR PRODUCTS) ×1 IMPLANT
GLIDESHEATH SLEND SS 6F .021 (SHEATH) ×1 IMPLANT
GUIDEWIRE INQWIRE 1.5J.035X260 (WIRE) IMPLANT
INQWIRE 1.5J .035X260CM (WIRE) ×2
KIT HEART LEFT (KITS) ×2 IMPLANT
PACK CARDIAC CATHETERIZATION (CUSTOM PROCEDURE TRAY) ×2 IMPLANT
SHEATH GLIDE SLENDER 4/5FR (SHEATH) ×1 IMPLANT
TRANSDUCER W/STOPCOCK (MISCELLANEOUS) ×2 IMPLANT
TUBING CIL FLEX 10 FLL-RA (TUBING) ×2 IMPLANT

## 2017-02-06 NOTE — Progress Notes (Signed)
Advanced Heart Failure Rounding Note   Subjective:    Diuresed well overnight. Breathing better. BP soft.   Echo reviewed personally EF 15-205 moderate MR. RV ok.    Objective:   Weight Range:  Vital Signs:   Temp:  [97.6 F (36.4 C)-98 F (36.7 C)] 97.6 F (36.4 C) (08/01 0500) Pulse Rate:  [85-98] 85 (08/01 0500) Resp:  [16-20] 16 (07/31 2002) BP: (91-94)/(63-69) 94/69 (08/01 0500) SpO2:  [92 %-97 %] 94 % (08/01 0500) Weight:  [63 kg (139 lb)-64.1 kg (141 lb 6.4 oz)] 63 kg (139 lb) (08/01 0500)    Weight change: Filed Weights   02/05/17 1500 02/06/17 0500  Weight: 64.1 kg (141 lb 6.4 oz) 63 kg (139 lb)    Intake/Output:   Intake/Output Summary (Last 24 hours) at 02/06/17 0917 Last data filed at 02/06/17 0500  Gross per 24 hour  Intake              840 ml  Output             2000 ml  Net            -1160 ml     Physical Exam: General:  Well appearing. No resp difficulty HEENT: normal Neck: supple. JVP flat . Carotids 2+ bilat; no bruits. No lymphadenopathy or thryomegaly appreciated. Cor: PMI nondisplaced. Regular rate & rhythm. 2/6 MR Lungs: clear Abdomen: soft, nontender, nondistended. No hepatosplenomegaly. No bruits or masses. Good bowel sounds. Extremities: no cyanosis, clubbing, rash, edema Neuro: alert & orientedx3, cranial nerves grossly intact. moves all 4 extremities w/o difficulty. Affect pleasant  Telemetry:  NSR 80-90s. Personally reviewed   Labs: Basic Metabolic Panel:  Recent Labs Lab 02/04/17 1443 02/06/17 0625  NA 139 137  K 4.0 3.4*  CL 110 106  CO2 22 23  GLUCOSE 110* 105*  BUN 15 17  CREATININE 0.68 0.75  CALCIUM 9.2 9.2    Liver Function Tests: No results for input(s): AST, ALT, ALKPHOS, BILITOT, PROT, ALBUMIN in the last 168 hours. No results for input(s): LIPASE, AMYLASE in the last 168 hours. No results for input(s): AMMONIA in the last 168 hours.  CBC:  Recent Labs Lab 02/04/17 1443 02/05/17 0259  02/06/17 0625  WBC 6.6 5.6 5.1  HGB 14.2 13.4 15.0  HCT 42.2 39.8 43.7  MCV 88.7 88.4 86.5  PLT 193 160 192    Cardiac Enzymes:  Recent Labs Lab 02/04/17 2054 02/05/17 0259 02/05/17 0750  TROPONINI <0.03 <0.03 <0.03    BNP: BNP (last 3 results)  Recent Labs  02/04/17 2054  BNP 1,065.2*    ProBNP (last 3 results) No results for input(s): PROBNP in the last 8760 hours.    Other results:  Imaging: Dg Chest 2 View  Result Date: 02/04/2017 CLINICAL DATA:  Increased shortness of breath for the past 2 weeks. EXAM: CHEST  2 VIEW COMPARISON:  06/13/2010. FINDINGS: Interval enlarged cardiac silhouette and prominent pulmonary vasculature and interstitial markings with Kerley lines. There are also interval small bilateral pleural effusions. Mild thoracic spine degenerative changes. Cholecystectomy clips. IMPRESSION: Interval cardiomegaly and changes of congestive heart failure. Electronically Signed   By: Claudie Revering M.D.   On: 02/04/2017 17:46   Ct Angio Chest Pe W And/or Wo Contrast  Result Date: 02/04/2017 CLINICAL DATA:  Shortness of breath and chest tightness. Breast cancer 9 years ago. EXAM: CT ANGIOGRAPHY CHEST WITH CONTRAST TECHNIQUE: Multidetector CT imaging of the chest was performed using the standard protocol during  bolus administration of intravenous contrast. Multiplanar CT image reconstructions and MIPs were obtained to evaluate the vascular anatomy. CONTRAST:  100 mL of Isovue 370 COMPARISON:  Chest x-ray from today.  Chest CT October 27, 2009 FINDINGS: Cardiovascular: Cardiomegaly is identified. Mild coronary artery calcifications. Evaluation of the thoracic aorta is limited due to timing of contrast but no aneurysm is seen. Atherosclerotic changes noted. No pulmonary emboli are identified. Mediastinum/Nodes: Small bilateral pleural effusions. No pericardial effusion. No adenopathy. The thyroid and esophagus are normal. Lungs/Pleura: Central airways are normal. No  pneumothorax. Interlobular septal thickening, ground-glass, and bilateral pleural effusions are consistent with pulmonary edema. There is a flat nodular density in the medial right apex on series 6, image 27 not seen in 2011 measuring up to 6 mm. No other nodules. No masses. No evidence of pneumonia or aspiration. Upper Abdomen: No acute abnormality. Musculoskeletal: No chest wall abnormality. No acute or significant osseous findings. Review of the MIP images confirms the above findings. IMPRESSION: 1. No pulmonary emboli. 2. Cardiomegaly, small bilateral effusions, and pulmonary edema. 3. Atherosclerotic change in the thoracic aorta. 4. Flat nodular density in the medial right apex may be part of the broader process. However, this finding was not seen in 2011 and the patient has a history of breast cancer. As a result, recommend a short-term follow-up in approximately 3-6 months after resolution of symptoms to ensure resolution. Aortic Atherosclerosis (ICD10-I70.0). Electronically Signed   By: Dorise Bullion III M.D   On: 02/04/2017 18:57      Medications:     Scheduled Medications: . [MAR Hold] digoxin  0.125 mg Oral Daily  . [MAR Hold] enoxaparin (LOVENOX) injection  40 mg Subcutaneous Q24H  . [MAR Hold] furosemide  40 mg Intravenous BID  . [MAR Hold] sodium chloride flush  3 mL Intravenous Q12H  . sodium chloride flush  3 mL Intravenous Q12H  . [MAR Hold] spironolactone  12.5 mg Oral Daily     Infusions: . [MAR Hold] sodium chloride    . sodium chloride    . sodium chloride    . sodium chloride 10 mL/hr (02/06/17 0841)  . heparin       PRN Medications:  [MAR Hold] sodium chloride, sodium chloride, sodium chloride, [MAR Hold] acetaminophen, fentaNYL, heparin, heparin, lidocaine (PF), midazolam, [MAR Hold] ondansetron (ZOFRAN) IV, Radial Cocktail/Verapamil only, [MAR Hold] sodium chloride flush, sodium chloride flush   Assessment:   Ms Nahm is 71 year old with history of  breast cancer treated with doxorubicin in 1096, diastolic heart failure admitted with increased dyspnea and new systolic heart failure. EF 15%   Plan/Discussion:    1. Acute systolic HF --echo reviewed personally EF 15-20%.  --diuresed well overnight. Weight down 2 pounds. --BP soft. Stop lasix. --Continue spiro and digoxin. No b-blocker yet. Hopefully can start losartan soon. -- For cath today  2. H/o breast CA --H/O Breast Cancer- treated with doxorubicin in 2009   3. PVCs --improved overnight  4. Mitral regurgitation --Mild to moderate. Likely due to dilaed annulus  Length of Stay: 2   Mixtli Reno MD 02/06/2017, 9:17 AM  Advanced Heart Failure Team Pager [phone] (M-F; [place])  Please contact [facility] for night-coverage after hours (4p -7a ) and weekends on [email or web address]

## 2017-02-06 NOTE — H&P (View-Only) (Signed)
Advanced Heart Failure Team Consult Note   Primary Physician: Primary Cardiologist:  Dr Haroldine Laws Oncologist: Dr Jana Hakim  Reason for Consultation: Heart Failure   HPI:    Patricia Collier is seen today for evaluation of dyspnea and heart failure  at the request of Dr Darnell Level.   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 She has been very active and continues to work as Immunologist at [employer].   On June 22nd she had foot surgery and has not felt well since that time. SOB with steps and with exertion. Yesterday she saw Dr Birdie Riddle and had tachycardia and mild hypoxia so she was sent to [facility] ED for further evaluation.   She has not had recent viral illness.   Admitted with increased dyspnea. Pertinent admission labs include creatinine 0.68, BNP 1065, troponin 0.03, and hgb 14.2. CXR concerning for HF. CT negative for PE and with new nodule R medial apex. Repeat ECHO pending. SBP running in the 80s but typically in the 120s.   ECHO 2016 - EF 55%  Grade IDD.   Review of Systems: [y] = yes, [ ]  = no   General: Weight gain [ ] ; Weight loss [ ] ; Anorexia [ ] ; Fatigue [ Y]; Fever [ ] ; Chills [ ] ; Weakness [ ]   Cardiac: Chest pain/pressure [ ] ; Resting SOB [ ] ; Exertional SOB [Y ]; Orthopnea [ ] ; Pedal Edema [ ] ; Palpitations [ ] ; Syncope [ ] ; Presyncope [ ] ; Paroxysmal nocturnal dyspnea[ ]   Pulmonary: Cough [ ] ; Wheezing[ ] ; Hemoptysis[ ] ; Sputum [ ] ; Snoring [ ]   GI: Vomiting[ ] ; Dysphagia[ ] ; Melena[ ] ; Hematochezia [ ] ; Heartburn[ ] ; Abdominal pain [ ] ; Constipation [ ] ; Diarrhea [ ] ; BRBPR [ ]   GU: Hematuria[ ] ; Dysuria [ ] ; Nocturia[ ]   Vascular: Pain in legs with walking [ ] ; Pain in feet with lying flat [ ] ; Non-healing sores [ ] ; Stroke [ ] ; TIA [ ] ; Slurred speech [ ] ;  Neuro:  Headaches[ ] ; Vertigo[ ] ; Seizures[ ] ; Paresthesias[ ] ;Blurred vision [ ] ; Diplopia [ ] ; Vision changes [ ]   Ortho/Skin: Arthritis [ ] ; Joint pain [Y ]; Muscle pain [ ] ; Joint swelling [ ] ; Back Pain [ ] ; Rash [ ]   Psych: Depression[ ] ; Anxiety[ ]   Heme: Bleeding problems [ ] ; Clotting disorders [ ] ; Anemia [ ]   Endocrine: Diabetes [ ] ; Thyroid dysfunction[ ]   Home Medications Prior to Admission medications   Medication Sig Start Date End Date Taking? Authorizing Provider  Cholecalciferol (VITAMIN D-3 PO) Take 1 capsule by mouth daily.   Yes [provider]  ibuprofen (ADVIL,MOTRIN) 200 MG tablet Take 200-400 mg by mouth every 6 (six) hours as needed (for pain or muscle soreness).   Yes [provider]  Magnesium 250 MG TABS Take 250 mg by mouth daily.   Yes [provider]    Past Medical History: Past Medical History:  Diagnosis Date  . Arthritis    ostearthritis -joints, greater left knee.  . Breast cancer [facility])    surgery, radiation, chemotherapy.-no further oncology visits.  . Foot joint disorder    pain greater left foot ? related to retained hardware.  . Hyperlipidemia   . Neuromuscular disorder ([place])    neuropathy due to chemotherapy  . Personal history of chemotherapy 2009  .  Personal history of radiation therapy 2009  . SVT (supraventricular tachycardia) (HCC)    x1 episode -3 yrs ago- no issues since.All heart testing negative. No further cardiac visits needed.    Past Surgical History: Past Surgical History:  Procedure Laterality Date  . BREAST LUMPECTOMY Left 2009   sentinel node dissection  . BUNIONECTOMY     past has appointment to have foot check on -Pain presently more in left foot, ? related to retained hardware"  . CARDIAC CATHETERIZATION    . CHOLECYSTECTOMY     laparscopic  . DIAGNOSTIC LAPAROSCOPY     multiple for adhesions  . HAMMER TOE SURGERY    . KNEE ARTHROSCOPY Left 09/21/2015   Procedure: LEFT ARTHROSCOPY KNEE WITH  LATERAL MENISCUS DEBRIDEMENT;  Surgeon: Gaynelle Arabian, MD;  Location: [facility]S;  Service: Orthopedics;  Laterality: Left;  . NASAL SINUS SURGERY  1979  . TOTAL ABDOMINAL HYSTERECTOMY  1984  . TRANSANAL RECTOPEXY      Family History: Family History  Problem Relation Age of Onset  . Heart disease Mother   . Hypertension Father   . Breast cancer Sister   . Lung cancer Sister   . Skin cancer Brother     Social History: Social History   Social History  . Marital status: Married    Spouse name: N/A  . Number of children: N/A  . Years of education: N/A   Social History Main Topics  . Smoking status: Former Smoker    Packs/day: 2.00    Years: 10.00    Types: Cigarettes    Quit date: 07/09/1989  . Smokeless tobacco: Never Used     Comment: 92 ys ago.  . Alcohol use No  . Drug use: No  . Sexual activity: Not Asked   Other Topics Concern  . None   Social History Narrative  . None    Allergies:  Allergies  Allergen Reactions  . Effexor [Venlafaxine] Other (See Comments)    Caused depression, negative thoughts, and crying   . Hydrocodone-Acetaminophen Itching and Nausea And Vomiting    Patient can tolerate with Zofran    Objective:    Vital Signs:   Temp:  [97.4 F (36.3 C)] 97.4 F (36.3 C) (07/30 1427) Pulse Rate:  [81-119] 95 (07/31 0715) Resp:  [12-24] 15 (07/31 0715) BP: (90-126)/(59-97) 101/65 (07/31 0715) SpO2:  [91 %-100 %] 100 % (07/31 0715)    Weight change: There were no vitals filed for this visit.  Intake/Output:   Intake/Output Summary (Last 24 hours) at 02/05/17 1049 Last data filed at 02/04/17 2052  Gross per 24 hour  Intake            24.15 ml  Output                0 ml  Net            24.15 ml      Physical Exam    General:  Well appearing. No resp difficulty. In bed  HEENT: normal Neck: supple. JVP 7-8  . Carotids 2+ bilat; no bruits. No lymphadenopathy or thyromegaly appreciated. Cor: PMI nondisplaced. Tachy regular  No rubs,  gallops. 2-3+ MR Lungs: clear. No wheeze Abdomen: soft, nontender, nondistended. No hepatosplenomegaly. No bruits or masses. Good bowel sounds. Extremities: no cyanosis, clubbing, rash, R and LLE trace edema Neuro: alert & orientedx3, cranial nerves grossly intact. moves all 4 extremities w/o difficulty. Affect pleasant   Telemetry   Sinus Tach 100s +PVCs  EKG    SInus Tach with frequent PVCs 113 bpm  Labs   Basic Metabolic Panel:  Recent Labs Lab 02/04/17 1443  NA 139  K 4.0  CL 110  CO2 22  GLUCOSE 110*  BUN 15  CREATININE 0.68  CALCIUM 9.2    Liver Function Tests: No results for input(s): AST, ALT, ALKPHOS, BILITOT, PROT, ALBUMIN in the last 168 hours. No results for input(s): LIPASE, AMYLASE in the last 168 hours. No results for input(s): AMMONIA in the last 168 hours.  CBC:  Recent Labs Lab 02/04/17 1443 02/05/17 0259  WBC 6.6 5.6  HGB 14.2 13.4  HCT 42.2 39.8  MCV 88.7 88.4  PLT 193 160    Cardiac Enzymes:  Recent Labs Lab 02/04/17 2054 02/05/17 0259 02/05/17 0750  TROPONINI <0.03 <0.03 <0.03    BNP: BNP (last 3 results)  Recent Labs  02/04/17 2054  BNP 1,065.2*    ProBNP (last 3 results) No results for input(s): PROBNP in the last 8760 hours.   CBG: No results for input(s): GLUCAP in the last 168 hours.  Coagulation Studies: No results for input(s): LABPROT, INR in the last 72 hours.   Imaging   Dg Chest 2 View  Result Date: 02/04/2017 CLINICAL DATA:  Increased shortness of breath for the past 2 weeks. EXAM: CHEST  2 VIEW COMPARISON:  06/13/2010. FINDINGS: Interval enlarged cardiac silhouette and prominent pulmonary vasculature and interstitial markings with Kerley lines. There are also interval small bilateral pleural effusions. Mild thoracic spine degenerative changes. Cholecystectomy clips. IMPRESSION: Interval cardiomegaly and changes of congestive heart failure. Electronically Signed   By: Claudie Revering M.D.   On:  02/04/2017 17:46   Ct Angio Chest Pe W And/or Wo Contrast  Result Date: 02/04/2017 CLINICAL DATA:  Shortness of breath and chest tightness. Breast cancer 9 years ago. EXAM: CT ANGIOGRAPHY CHEST WITH CONTRAST TECHNIQUE: Multidetector CT imaging of the chest was performed using the standard protocol during bolus administration of intravenous contrast. Multiplanar CT image reconstructions and MIPs were obtained to evaluate the vascular anatomy. CONTRAST:  100 mL of Isovue 370 COMPARISON:  Chest x-ray from today.  Chest CT October 27, 2009 FINDINGS: Cardiovascular: Cardiomegaly is identified. Mild coronary artery calcifications. Evaluation of the thoracic aorta is limited due to timing of contrast but no aneurysm is seen. Atherosclerotic changes noted. No pulmonary emboli are identified. Mediastinum/Nodes: Small bilateral pleural effusions. No pericardial effusion. No adenopathy. The thyroid and esophagus are normal. Lungs/Pleura: Central airways are normal. No pneumothorax. Interlobular septal thickening, ground-glass, and bilateral pleural effusions are consistent with pulmonary edema. There is a flat nodular density in the medial right apex on series 6, image 27 not seen in 2011 measuring up to 6 mm. No other nodules. No masses. No evidence of pneumonia or aspiration. Upper Abdomen: No acute abnormality. Musculoskeletal: No chest wall abnormality. No acute or significant osseous findings. Review of the MIP images confirms the above findings. IMPRESSION: 1. No pulmonary emboli. 2. Cardiomegaly, small bilateral effusions, and pulmonary edema. 3. Atherosclerotic change in the thoracic aorta. 4. Flat nodular density in the medial right apex may be part of the broader process. However, this finding was not seen in 2011 and the patient has a history of breast cancer. As a result, recommend a short-term follow-up in approximately 3-6 months after resolution of symptoms to ensure resolution. Aortic Atherosclerosis  (ICD10-I70.0). Electronically Signed   By: Dorise Bullion III M.D   On: 02/04/2017 18:57      Medications:  Current Medications: . enoxaparin (LOVENOX) injection  40 mg Subcutaneous Q24H  . sodium chloride flush  3 mL Intravenous Q12H     Infusions: . sodium chloride         Patient Profile  Patricia Collier is 71 year old with history of breast cancer treated with doxorubicin in 7588, diastolic heart failure admitted with increased dyspnea and new systolic heart failure.   Assessment/Plan   1. Dyspnea---> New Acute Systolic Heart Failure Bedside ECHO today by Dr Haroldine Laws. EF ~20% with MR.  ? Chemo induced. TSH 3.24. No recent virus. Troponin low. Will need RHC/LHC tomorrow. May need intropes but will see the results of RHC.  Formal ECHO today . Mild volume overload. Start lasix 40 mg twice a day. Add digoxin 0.125 mg dialy.  Add 12.5 mg spiro daily.   2. MR - formal echo today.  3. PVCs 3.H/O Breast Cancer- treated with doxorubicin in 2009  5. R pulmonary nodule    Length of Stay: 1  Amy Clegg, NP  02/05/2017, 10:49 AM  Advanced Heart Failure Team Pager [phone] (M-F; [place])  Please contact [facility] for night-coverage after hours (4p -7a ) and weekends on [email or web address]  Patient seen and examined with Darrick Grinder, NP. We discussed all aspects of the encounter. I agree with the assessment and plan as stated above.   She has acute onset of systolic HF NYHA III-IIIb symptoms. Bedside echo shows EF 20% with moderate MR. RV normal. BP low. Possible low output.   ? Adriamycin toxicity vs ischemic vs PVC related vs valvular   Will get formal echo to better evaluate MV  Start IV lasix, Spiro and digoxin. Plan R?L cath tomorrow.   Glori Bickers, MD  12:10 PM

## 2017-02-06 NOTE — Progress Notes (Signed)
PROGRESS NOTE    Patricia Collier  XAJ:[id number] DOB: 08-09-1945 DOA: 02/04/2017 PCP: [name], MD   Brief Narrative: Patricia Collier is a 71 y.o. female with medical history significant of breast cancer remote, chronic diastolic heart failure, comes in for persistent DOE. 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   Assessment & Plan:   Active Problems:   Dyspnea on exertion   Abnormal EKG   Cardiomegaly   Fluid overload   Pulmonary nodule   CHF (congestive heart failure) (HCC)   Acute systolic heart failure: Etiology unclear, possibly from doxorubicin.  Echo showed LVEF of 15% .  Cardiac catheterization today showed severely depressed cardiac output, non obstructive mild CAD, and NICM with LVEF of 15%.  Currently on lasix.    H/o breast cancer:  In remission.   DVT prophylaxis: (Lovenox/) Code Status: (Full code) Family Communication: (none at bedside.  Disposition Plan: possibly home in 1 to 2 days.    Consultants:   Cardiology .   Procedures: cardiac cath.    Antimicrobials: none.   Subjective: No new complaints. No chest pain. Reports some sob on ambulation, much better than on admission.   Objective: Vitals:   02/06/17 0909 02/06/17 0913 02/06/17 0918 02/06/17 1415  BP: 98/62 107/67 104/65 (!) 108/51  Pulse: 92 89 89 (!) 104  Resp: 17 14 14 14   Temp:    98.7 F (37.1 C)  TempSrc:    Oral  SpO2: 91% 95% 97% 100%  Weight:      Height:        Intake/Output Summary (Last 24 hours) at 02/06/17 1845 Last data filed at 02/06/17 1250  Gross per 24 hour  Intake               75 ml  Output              500 ml  Net             -425 ml   Filed Weights   02/05/17 1500 02/06/17 0500  Weight: 64.1 kg (141 lb 6.4 oz) 63 kg (139 lb)    Examination:  General exam: Appears calm and comfortable  Respiratory system: Clear to auscultation. Respiratory effort normal. Cardiovascular system: S1 & S2 heard, RRR. No JVD, , rubs, gallops or clicks.  Soft  systolic murmer. No pedal edema. Gastrointestinal system: Abdomen is nondistended, soft and nontender. No organomegaly or masses felt. Normal bowel sounds heard. Central nervous system: Alert and oriented. No focal neurological deficits. Extremities: Symmetric 5 x 5 power. Skin: No rashes, lesions or ulcers Psychiatry: Judgement and insight appear normal. Mood & affect appropriate.     Data Reviewed: I have personally reviewed following labs and imaging studies  CBC:  Recent Labs Lab 02/04/17 1443 02/05/17 0259 02/06/17 0625  WBC 6.6 5.6 5.1  HGB 14.2 13.4 15.0  HCT 42.2 39.8 43.7  MCV 88.7 88.4 86.5  PLT 193 160 094   Basic Metabolic Panel:  Recent Labs Lab 02/04/17 1443 02/06/17 0625  NA 139 137  K 4.0 3.4*  CL 110 106  CO2 22 23  GLUCOSE 110* 105*  BUN 15 17  CREATININE 0.68 0.75  CALCIUM 9.2 9.2   GFR: Estimated Creatinine Clearance: 58.1 mL/min (by C-G formula based on SCr of 0.75 mg/dL). Liver Function Tests: No results for input(s): AST, ALT, ALKPHOS, BILITOT, PROT, ALBUMIN in the last 168 hours. No results for input(s): LIPASE, AMYLASE in the last 168 hours. No results for  input(s): AMMONIA in the last 168 hours. Coagulation Profile:  Recent Labs Lab 02/06/17 0628  INR 1.17   Cardiac Enzymes:  Recent Labs Lab 02/04/17 2054 02/05/17 0259 02/05/17 0750  TROPONINI <0.03 <0.03 <0.03   BNP (last 3 results) No results for input(s): PROBNP in the last 8760 hours. HbA1C: No results for input(s): HGBA1C in the last 72 hours. CBG: No results for input(s): GLUCAP in the last 168 hours. Lipid Profile: No results for input(s): CHOL, HDL, LDLCALC, TRIG, CHOLHDL, LDLDIRECT in the last 72 hours. Thyroid Function Tests:  Recent Labs  02/05/17 0259  TSH 3.421   Anemia Panel: No results for input(s): VITAMINB12, FOLATE, FERRITIN, TIBC, IRON, RETICCTPCT in the last 72 hours. Sepsis Labs: No results for input(s): PROCALCITON, LATICACIDVEN in the last  168 hours.  No results found for this or any previous visit (from the past 240 hour(s)).       Radiology Studies: No results found.      Scheduled Meds: . digoxin  0.125 mg Oral Daily  . [START ON 02/07/2017] enoxaparin (LOVENOX) injection  40 mg Subcutaneous Q24H  . sodium chloride flush  3 mL Intravenous Q12H  . sodium chloride flush  3 mL Intravenous Q12H  . spironolactone  12.5 mg Oral Daily   Continuous Infusions: . sodium chloride    . sodium chloride       LOS: 2 days    Time spent:35 minutes.     Hosie Poisson, MD [facility] Pager [phone] If 7PM-7AM, please contact night-coverage [email or web address] Password [facility] 02/06/2017, 6:45 PM

## 2017-02-06 NOTE — Interval H&P Note (Signed)
History and Physical Interval Note:  02/06/2017 8:52 AM  Patricia Collier  has presented today for surgery, with the diagnosis of hf  The various methods of treatment have been discussed with the patient and family. After consideration of risks, benefits and other options for treatment, the patient has consented to  Procedure(s): Right/Left Heart Cath and Coronary Angiography (N/A) and possible percutaneous coronary angioplasty as a surgical intervention .  The patient's history has been reviewed, patient examined, no change in status, stable for surgery.  I have reviewed the patient's chart and labs.  Questions were answered to the patient's satisfaction.     Levy Cedano, Quillian Quince

## 2017-02-07 DIAGNOSIS — R0609 Other forms of dyspnea: Secondary | ICD-10-CM

## 2017-02-07 DIAGNOSIS — I5023 Acute on chronic systolic (congestive) heart failure: Secondary | ICD-10-CM

## 2017-02-07 LAB — CBC
HCT: 43.1 % (ref 36.0–46.0)
Hemoglobin: 14.5 g/dL (ref 12.0–15.0)
MCH: 29.8 pg (ref 26.0–34.0)
MCHC: 33.6 g/dL (ref 30.0–36.0)
MCV: 88.5 fL (ref 78.0–100.0)
PLATELETS: 207 10*3/uL (ref 150–400)
RBC: 4.87 MIL/uL (ref 3.87–5.11)
RDW: 13.8 % (ref 11.5–15.5)
WBC: 5.9 10*3/uL (ref 4.0–10.5)

## 2017-02-07 LAB — BASIC METABOLIC PANEL
Anion gap: 8 (ref 5–15)
BUN: 19 mg/dL (ref 6–20)
CALCIUM: 9.2 mg/dL (ref 8.9–10.3)
CO2: 24 mmol/L (ref 22–32)
Chloride: 106 mmol/L (ref 101–111)
Creatinine, Ser: 0.67 mg/dL (ref 0.44–1.00)
GFR calc Af Amer: >60 mL/min (ref >60)
GLUCOSE: 104 mg/dL — AB (ref 65–99)
Potassium: 3.9 mmol/L (ref 3.5–5.1)
SODIUM: 138 mmol/L (ref 135–145)

## 2017-02-07 MED ORDER — SPIRONOLACTONE 25 MG PO TABS: 12.5000 mg | ORAL_TABLET | Freq: Every day | ORAL | 0 refills | Status: DC

## 2017-02-07 MED ORDER — LOSARTAN POTASSIUM 25 MG PO TABS: 12.5000 mg | ORAL_TABLET | Freq: Every day | ORAL | Status: DC

## 2017-02-07 MED ORDER — DIGOXIN 125 MCG PO TABS: 0.1250 mg | ORAL_TABLET | Freq: Every day | ORAL | 1 refills | Status: DC

## 2017-02-07 MED ORDER — FUROSEMIDE 20 MG PO TABS: 20.0000 mg | ORAL_TABLET | ORAL | 1 refills | Status: DC | PRN

## 2017-02-07 MED ORDER — IBUPROFEN 200 MG PO TABS: 200.0000 mg | ORAL_TABLET | Freq: Three times a day (TID) | ORAL | 0 refills | Status: DC | PRN

## 2017-02-07 MED ORDER — FUROSEMIDE 20 MG PO TABS: 20.0000 mg | ORAL_TABLET | Freq: Three times a day (TID) | ORAL | Status: DC | PRN

## 2017-02-07 MED ORDER — LOSARTAN POTASSIUM 25 MG PO TABS: 12.5000 mg | ORAL_TABLET | Freq: Every day | ORAL | 0 refills | Status: DC

## 2017-02-07 MED FILL — LOSARTAN POTASSIUM 25 MG TA: 25 | 60 days supply | Qty: 30 | Fill #0

## 2017-02-07 MED FILL — SPIRONOLACTONE 25 MG TABLET: 25 | 60 days supply | Qty: 30 | Fill #0

## 2017-02-07 MED FILL — FUROSEMIDE 20 MG TABLET: 20 | 30 days supply | Qty: 30 | Fill #0

## 2017-02-07 MED FILL — DIGOXIN 0.125 MG TABLET: 125 | 30 days supply | Qty: 30 | Fill #0

## 2017-02-07 NOTE — Care Management Note (Signed)
Case Management Note  Patient Details  Name: Patricia Collier MRN: [id number] Date of Birth: April 13, 1946  Subjective/Objective:  Pt presented for Dyspne on exertion- Acute systolic HF.  Post cardiac cath 02-06-17. Pt is from home with husband. Plan for d/c home today.                     Action/Plan:  We did discuss the use of pulse oximetry for home and places to purchase. No further needs identified at this time from CM.  Expected Discharge Date:  02/07/17               Expected Discharge Plan:  Home/Self Care  In-House Referral:  NA  Discharge planning Services  CM Consult  Post Acute Care Choice:  NA Choice offered to:  NA  DME Arranged:  N/A DME Agency:  NA  HH Arranged:  NA HH Agency:  NA  Status of Service:  Completed, signed off  If discussed at [place] of Stay Meetings, dates discussed:    Additional Comments:  Bethena Roys, RN 02/07/2017, 10:44 AM

## 2017-02-07 NOTE — Discharge Summary (Addendum)
Physician Discharge Summary  Patricia Collier [id number] DOB: Apr 30, 1946 DOA: 02/04/2017  PCP: [name], MD  Admit date: 02/04/2017 Discharge date: 02/07/2017  Admitted From: Home.  Disposition:  Home.   Recommendations for Outpatient Follow-up:  1. Follow up with PCP in 1-2 weeks 2. Please obtain BMP/CBC in one week Please follow up with Heart failure team in one week.  Please follow up with a repeat CT angio in 3 to 6 months to evaluate for resolution of the pulmonary nodule.   Discharge Condition:stable.  CODE STATUS: full code.  Diet recommendation: Heart Healthy   Brief/Interim Summary: Patricia Clyatt Collinsis a 71 y.o.femalewith medical history significant of breast cancer remote, chronic diastolic heart failure, comes in for persistent DOE. 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 Cardiac cath doneon 8/1 showed non obstructive CAD AND NICM.   Discharge Diagnoses:  Active Problems:   Dyspnea on exertion   Abnormal EKG   Cardiomegaly   Fluid overload   Pulmonary nodule   CHF (congestive heart failure) (HCC)   Acute on chronic systolic (congestive) heart failure (HCC)  Acute systolic heart failure: Etiology unclear, possibly from doxorubicin.  Echo showed LVEF of 15% .  Cardiac catheterization today showed severely depressed cardiac output, non obstructive mild CAD, and NICM with LVEF of 15%.  Currently on lasix PRN, cardiology added dig, spironolactone and losartan.    H/o breast cancer:  Follow up with oncology as needed.   Pulmonary nodule:  Flat nodular density in the medial right apex may be part of the broader process. However, this finding was not seen in 2011 and the patient has a history of breast cancer. As a result, recommend a short-term follow-up in approximately 3-6 months after resolution of symptoms to ensure resolution.   Discharge Instructions  Discharge Instructions    Diet - low sodium heart healthy    Complete  by:  As directed    Discharge instructions    Complete by:  As directed    Please follow up with Dr Sung Amabile in one week as recommended.     Allergies as of 02/07/2017      Reactions   Effexor [venlafaxine] Other (See Comments)   Caused depression, negative thoughts, and crying    Hydrocodone-acetaminophen Itching, Nausea And Vomiting   Patient can tolerate with Zofran      Medication List    TAKE these medications   digoxin 0.125 MG tablet Commonly known as:  LANOXIN Take 1 tablet (0.125 mg total) by mouth daily.   furosemide 20 MG tablet Commonly known as:  LASIX Take 1 tablet (20 mg total) by mouth as needed for edema (weight gain).   ibuprofen 200 MG tablet Commonly known as:  ADVIL,MOTRIN Take 1 tablet (200 mg total) by mouth every 8 (eight) hours as needed (for pain or muscle soreness). What changed:  how much to take  when to take this   losartan 25 MG tablet Commonly known as:  COZAAR Take 0.5 tablets (12.5 mg total) by mouth at bedtime.   Magnesium 250 MG Tabs Take 250 mg by mouth daily.   spironolactone 25 MG tablet Commonly known as:  ALDACTONE Take 0.5 tablets (12.5 mg total) by mouth daily.   VITAMIN D-3 PO Take 1 capsule by mouth daily.      Follow-up Information    [name], MD. Schedule an appointment as soon as possible for a visit in 1 week(s).   Specialty:  Family Medicine Contact information:  [address] [place] [address] [phone]        Bensimhon, Shaune Pascal, MD Follow up on 02/21/2017.   Specialty:  Cardiology Why:  at 3:20 for follow up. Garage code [id number]. We are located at [facility] on the first floor in the Heart and Vascular center. May enter garage through construction entrance.  Contact information: [place] [address] [phone]          Allergies  Allergen Reactions  . Effexor [Venlafaxine] Other (See Comments)    Caused depression, negative thoughts,  and crying   . Hydrocodone-Acetaminophen Itching and Nausea And Vomiting    Patient can tolerate with Zofran    Consultations:  HF team.    Procedures/Studies: Dg Chest 2 View  Result Date: 02/04/2017 CLINICAL DATA:  Increased shortness of breath for the past 2 weeks. EXAM: CHEST  2 VIEW COMPARISON:  06/13/2010. FINDINGS: Interval enlarged cardiac silhouette and prominent pulmonary vasculature and interstitial markings with Kerley lines. There are also interval small bilateral pleural effusions. Mild thoracic spine degenerative changes. Cholecystectomy clips. IMPRESSION: Interval cardiomegaly and changes of congestive heart failure. Electronically Signed   By: Claudie Revering M.D.   On: 02/04/2017 17:46   Ct Angio Chest Pe W And/or Wo Contrast  Result Date: 02/04/2017 CLINICAL DATA:  Shortness of breath and chest tightness. Breast cancer 9 years ago. EXAM: CT ANGIOGRAPHY CHEST WITH CONTRAST TECHNIQUE: Multidetector CT imaging of the chest was performed using the standard protocol during bolus administration of intravenous contrast. Multiplanar CT image reconstructions and MIPs were obtained to evaluate the vascular anatomy. CONTRAST:  100 mL of Isovue 370 COMPARISON:  Chest x-ray from today.  Chest CT October 27, 2009 FINDINGS: Cardiovascular: Cardiomegaly is identified. Mild coronary artery calcifications. Evaluation of the thoracic aorta is limited due to timing of contrast but no aneurysm is seen. Atherosclerotic changes noted. No pulmonary emboli are identified. Mediastinum/Nodes: Small bilateral pleural effusions. No pericardial effusion. No adenopathy. The thyroid and esophagus are normal. Lungs/Pleura: Central airways are normal. No pneumothorax. Interlobular septal thickening, ground-glass, and bilateral pleural effusions are consistent with pulmonary edema. There is a flat nodular density in the medial right apex on series 6, image 27 not seen in 2011 measuring up to 6 mm. No other nodules. No  masses. No evidence of pneumonia or aspiration. Upper Abdomen: No acute abnormality. Musculoskeletal: No chest wall abnormality. No acute or significant osseous findings. Review of the MIP images confirms the above findings. IMPRESSION: 1. No pulmonary emboli. 2. Cardiomegaly, small bilateral effusions, and pulmonary edema. 3. Atherosclerotic change in the thoracic aorta. 4. Flat nodular density in the medial right apex may be part of the broader process. However, this finding was not seen in 2011 and the patient has a history of breast cancer. As a result, recommend a short-term follow-up in approximately 3-6 months after resolution of symptoms to ensure resolution. Aortic Atherosclerosis (ICD10-I70.0). Electronically Signed   By: Dorise Bullion III M.D   On: 02/04/2017 18:57   Dg Foot Complete Right  Result Date: 01/14/2017 Please see detailed radiograph report in office note.   Echocardiogram Cardiac catheterization.    Subjective: Chest pressure improved.  No sob. Or pedal edema.   Discharge Exam: Vitals:   02/07/17 0510 02/07/17 0512  BP: (!) 85/51 (!) 92/55  Pulse: 87   Resp:    Temp: 97.8 F (36.6 C)    Vitals:   02/06/17 1415 02/06/17 2059 02/07/17 0510 02/07/17  0512  BP: (!) 108/51 (!) 103/57 (!) 85/51 (!) 92/55  Pulse: (!) 104 93 87   Resp: 14 16    Temp: 98.7 F (37.1 C) 97.9 F (36.6 C) 97.8 F (36.6 C)   TempSrc: Oral Oral Oral   SpO2: 100% 96% 97%   Weight:    63.8 kg (140 lb 9.6 oz)  Height:        General: Pt is alert, awake, not in acute distress Cardiovascular: RRR, S1/S2 +, no rubs, no gallops Respiratory: CTA bilaterally, no wheezing, no rhonchi Abdominal: Soft, NT, ND, bowel sounds + Extremities: no edema, no cyanosis    The results of significant diagnostics from this hospitalization (including imaging, microbiology, ancillary and laboratory) are listed below for reference.     Microbiology: No results found for this or any previous visit (from  the past 240 hour(s)).   Labs: BNP (last 3 results)  Recent Labs  02/04/17 2054  BNP 3,846.6*   Basic Metabolic Panel:  Recent Labs Lab 02/04/17 1443 02/06/17 0625 02/07/17 0238  NA 139 137 138  K 4.0 3.4* 3.9  CL 110 106 106  CO2 22 23 24   GLUCOSE 110* 105* 104*  BUN 15 17 19   CREATININE 0.68 0.75 0.67  CALCIUM 9.2 9.2 9.2   Liver Function Tests: No results for input(s): AST, ALT, ALKPHOS, BILITOT, PROT, ALBUMIN in the last 168 hours. No results for input(s): LIPASE, AMYLASE in the last 168 hours. No results for input(s): AMMONIA in the last 168 hours. CBC:  Recent Labs Lab 02/04/17 1443 02/05/17 0259 02/06/17 0625 02/07/17 0238  WBC 6.6 5.6 5.1 5.9  HGB 14.2 13.4 15.0 14.5  HCT 42.2 39.8 43.7 43.1  MCV 88.7 88.4 86.5 88.5  PLT 193 160 192 207   Cardiac Enzymes:  Recent Labs Lab 02/04/17 2054 02/05/17 0259 02/05/17 0750  TROPONINI <0.03 <0.03 <0.03   BNP: Invalid input(s): POCBNP CBG: No results for input(s): GLUCAP in the last 168 hours. D-Dimer No results for input(s): DDIMER in the last 72 hours. Hgb A1c No results for input(s): HGBA1C in the last 72 hours. Lipid Profile No results for input(s): CHOL, HDL, LDLCALC, TRIG, CHOLHDL, LDLDIRECT in the last 72 hours. Thyroid function studies  Recent Labs  02/05/17 0259  TSH 3.421   Anemia work up No results for input(s): VITAMINB12, FOLATE, FERRITIN, TIBC, IRON, RETICCTPCT in the last 72 hours. Urinalysis    Component Value Date/Time   COLORURINE YELLOW 02/04/2017 2333   APPEARANCEUR CLEAR 02/04/2017 2333   LABSPEC 1.024 02/04/2017 2333   LABSPEC 1.015 01/19/2008 1209   PHURINE 5.0 02/04/2017 2333   GLUCOSEU NEGATIVE 02/04/2017 2333   HGBUR SMALL (A) 02/04/2017 2333   BILIRUBINUR NEGATIVE 02/04/2017 2333   BILIRUBINUR negative 06/21/2014 1641   BILIRUBINUR Negative 01/19/2008 1209   KETONESUR NEGATIVE 02/04/2017 2333   PROTEINUR NEGATIVE 02/04/2017 2333   UROBILINOGEN 2.0 06/21/2014  1641   UROBILINOGEN 0.2 10/16/2007 0859   NITRITE NEGATIVE 02/04/2017 2333   LEUKOCYTESUR NEGATIVE 02/04/2017 2333   LEUKOCYTESUR Negative 01/19/2008 1209   Sepsis Labs Invalid input(s): PROCALCITONIN,  WBC,  LACTICIDVEN Microbiology No results found for this or any previous visit (from the past 240 hour(s)).   Time coordinating discharge: Over 30 minutes  SIGNED:   Hosie Poisson, MD  [facility] 02/07/2017, 11:43 AM Pager   If 7PM-7AM, please contact night-coverage [email or web address] Password [email or web address]

## 2017-02-07 NOTE — Progress Notes (Signed)
Advanced Heart Failure Rounding Note   Subjective:    Right/Left heart cath 02/06/17 Findings:  Ao = 80/56 (68) LV = 86/14 RA = 2  RV = 22/2 PA = 24/11 (17) PCW = 6 Fick cardiac output/index = 3.1/1.8 PVR = 3.6 WU Ao sat = 94% PA sat = 58%, 58%  Assessment:  1. Mild coronary artery disease (non-obstructive) 2. Low filling pressures with moderately to severely depressed cardiac output 3. NICM EF 15-20% - ? Doxorubicin toxicity  Feels great today, has walked in the halls without SOB. She denies orthopnea and PND.     Objective:   Weight Range:  Vital Signs:   Temp:  [97.8 F (36.6 C)-98.7 F (37.1 C)] 97.8 F (36.6 C) (08/02 0510) Pulse Rate:  [87-104] 87 (08/02 0510) Resp:  [9-23] 16 (08/01 2059) BP: (85-113)/(51-72) 92/55 (08/02 0512) SpO2:  [89 %-100 %] 97 % (08/02 0510) Weight:  [140 lb 9.6 oz (63.8 kg)] 140 lb 9.6 oz (63.8 kg) (08/02 0512) Last BM Date: 02/04/17  Weight change: Filed Weights   02/05/17 1500 02/06/17 0500 02/07/17 0512  Weight: 141 lb 6.4 oz (64.1 kg) 139 lb (63 kg) 140 lb 9.6 oz (63.8 kg)    Intake/Output:   Intake/Output Summary (Last 24 hours) at 02/07/17 0758 Last data filed at 02/07/17 0630  Gross per 24 hour  Intake               75 ml  Output              850 ml  Net             -775 ml     Physical Exam: General: Well appearing. No resp difficulty. HEENT: Normal Neck: Supple. JVP flat. Carotids 2+ bilat; no bruits. No thyromegaly or nodule noted. Cor: PMI nondisplaced. RRR, No M/G/R noted Lungs: CTAB, normal effort. Abdomen: Soft, non-tender, non-distended, no HSM. No bruits or masses. +BS  Extremities: No cyanosis, clubbing, rash, R and LLE no edema.  Neuro: Alert & orientedx3, cranial nerves grossly intact. moves all 4 extremities w/o difficulty. Affect pleasant   Telemetry:  NSR 90's - personally reviewed.    Labs: Basic Metabolic Panel:  Recent Labs Lab 02/04/17 1443 02/06/17 0625 02/07/17 0238    NA 139 137 138  K 4.0 3.4* 3.9  CL 110 106 106  CO2 22 23 24   GLUCOSE 110* 105* 104*  BUN 15 17 19   CREATININE 0.68 0.75 0.67  CALCIUM 9.2 9.2 9.2    Liver Function Tests: No results for input(s): AST, ALT, ALKPHOS, BILITOT, PROT, ALBUMIN in the last 168 hours. No results for input(s): LIPASE, AMYLASE in the last 168 hours. No results for input(s): AMMONIA in the last 168 hours.  CBC:  Recent Labs Lab 02/04/17 1443 02/05/17 0259 02/06/17 0625 02/07/17 0238  WBC 6.6 5.6 5.1 5.9  HGB 14.2 13.4 15.0 14.5  HCT 42.2 39.8 43.7 43.1  MCV 88.7 88.4 86.5 88.5  PLT 193 160 192 207    Cardiac Enzymes:  Recent Labs Lab 02/04/17 2054 02/05/17 0259 02/05/17 0750  TROPONINI <0.03 <0.03 <0.03    BNP: BNP (last 3 results)  Recent Labs  02/04/17 2054  BNP 1,065.2*    ProBNP (last 3 results) No results for input(s): PROBNP in the last 8760 hours.  Transthoracic Echocardiography 02/05/17 Study Conclusions  - Left ventricle: The cavity size was mildly dilated. Wall   thickness was normal. Systolic function was severely reduced. The  estimated ejection fraction was in the range of 15% to 20%.   Severe diffuse hypokinesis with no identifiable regional   variations. Doppler parameters are consistent with abnormal left   ventricular relaxation (grade 1 diastolic dysfunction). No   evidence of thrombus. - Aortic valve: Valve area (VTI): 1.21 cm^2. Valve area (Vmax):   1.03 cm^2. Valve area (Vmean): 1.41 cm^2. - Mitral valve: Calcified annulus. There was mild to moderate   regurgitation directed centrally. - Left atrium: The atrium was mildly dilated. - Pericardium, extracardiac: There was a left pleural effusion.  Impressions:  - Compared to May 2016, left ventricular systolic function is much   worse and the left ventricle has dilated.   Medications:     Scheduled Medications: . digoxin  0.125 mg Oral Daily  . enoxaparin (LOVENOX) injection  40 mg  Subcutaneous Q24H  . sodium chloride flush  3 mL Intravenous Q12H  . sodium chloride flush  3 mL Intravenous Q12H  . spironolactone  12.5 mg Oral Daily    Infusions: . sodium chloride    . sodium chloride      PRN Medications: sodium chloride, sodium chloride, acetaminophen, ondansetron (ZOFRAN) IV, sodium chloride flush, sodium chloride flush   Assessment:   Patricia Collier is 71 year old with history of breast cancer treated with doxorubicin in 7867, diastolic heart failure admitted with increased dyspnea and new systolic heart failure. EF 15%   Plan/Discussion:    1. Acute systolic HF: NICM, Echo EF 15-20%. Treated with doxorubicin in 2009.  - Right heart cath results above - NYHA II - Volume status stable on exam, RA pressure 2. Would use prn Lasix when she goes home or send home on low dose.  - Continue digoxin - Continue Spiro.  - BP too soft for losartan or Entresto.  - BP too soft for Coreg.  - Discussed with her dietary and sodium restrictions.   2. H/o breast CA --H/O Breast Cancer- treated with doxorubicin in 2009  - No change.   3. PVCs - Improved.   4. Mitral regurgitation - Due to dilated annulus.   Length of Stay: [place] NP-C 02/07/2017, 7:58 AM  Advanced Heart Failure Team Pager [phone] (M-F; 7a - 4p)  Please contact [facility] for night-coverage after hours (4p -7a ) and weekends on [email or web address]  Patient seen and examined with Jettie Booze, NP. We discussed all aspects of the encounter. I agree with the assessment and plan as stated above.   Cath results reviewed with her. She is feeling much better. Concern for doxorubicin toxicity.  Can go home today on   Digoxin 0.125 Spiro 12.5 daily Losartan 12.5 qhs Lasix 20 mg prn weight gain. F/u next week.  Glori Bickers, MD  9:03 AM

## 2017-02-08 ENCOUNTER — Other Ambulatory Visit: Payer: Self-pay | Admitting: *Deleted

## 2017-02-08 ENCOUNTER — Telehealth: Payer: Self-pay

## 2017-02-08 NOTE — Telephone Encounter (Signed)
Transition Care Management Follow-up Telephone Call   Date discharged? 02/07/2017   How have you been since you were released from the hospital? "okay"   Do you understand why you were in the hospital? yes, Heart failure   Do you understand the discharge instructions? yes   Where were you discharged to? Home with husband.    Items Reviewed:  Medications reviewed: yes  Allergies reviewed: yes  Dietary changes reviewed: yes, heart healthy diet discussed  Referrals reviewed: yes   Functional Questionnaire:   Activities of Daily Living (ADLs):   She states they are independent in the following: ambulation, bathing and hygiene, feeding, continence, grooming, toileting and dressing States they require assistance with the following: None.   Any transportation issues/concerns?: no   Any patient concerns? no   Confirmed importance and date/time of follow-up visits scheduled yes  Provider Appointment booked with Elyn Aquas, PA-C 02/12/17 @ 11:30  Confirmed with patient if condition begins to worsen call PCP or go to the ER.  Patient was given the office number and encouraged to call back with question or concerns.  : yes

## 2017-02-08 NOTE — Patient Outreach (Addendum)
Attempted first telephone outreach to this [employer] member and nurse anesthetist at [facility] to assess her recovery and identify any care management needs related to her recent hospitalization for heart failure.Patricia Collier was hospitalized on 7/30 with a new diagnosis of cardiomegaly in addition to heart failure causing dyspnea on exertion. 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 .A cardiac cath done on 8/1 showed non obstructive CAD and nonischemic cardiomyopathy (NICM) and severely depressed cardiac output with a LEVF=15%.   A chest CT done 7/30 showed a new right apex pulmonary nodule with recommendation for repeat CT of chest in 3-6 months.She has a history of diastolic  dysfunction and has been hospitalized in the past with heart failure. She was discharged on 02/07/17 to home on lanoxin, lasix, cozaar and aldactone.She was instructed to weigh daily and report 3-4 lb weight gain in 1-2 days or 2 lbs overnight.  Left message on member's cell phone requesting return call in order to complete post discharge assessment for care management  Needs. If no return call today, another outreach will be made on 02/11/17. Barrington Ellison RN,CCM,CDE [place] Management Coordinator [facility] [employer] [phone] Office Fax [phone]

## 2017-02-12 ENCOUNTER — Ambulatory Visit (INDEPENDENT_AMBULATORY_CARE_PROVIDER_SITE_OTHER): Payer: 59 | Admitting: Physician Assistant

## 2017-02-12 ENCOUNTER — Telehealth (HOSPITAL_COMMUNITY): Payer: Self-pay | Admitting: *Deleted

## 2017-02-12 ENCOUNTER — Encounter: Payer: Self-pay | Admitting: Physician Assistant

## 2017-02-12 ENCOUNTER — Encounter (HOSPITAL_COMMUNITY): Payer: Self-pay | Admitting: *Deleted

## 2017-02-12 VITALS — BP 96/62 | HR 97 | Temp 97.8°F | Resp 14 | Ht 64.0 in | Wt 146.0 lb

## 2017-02-12 DIAGNOSIS — I5021 Acute systolic (congestive) heart failure: Secondary | ICD-10-CM | POA: Diagnosis not present

## 2017-02-12 LAB — COMPREHENSIVE METABOLIC PANEL
ALT: 14 U/L (ref 0–35)
AST: 14 U/L (ref 0–37)
Albumin: 4 g/dL (ref 3.5–5.2)
Alkaline Phosphatase: 116 U/L (ref 39–117)
BILIRUBIN TOTAL: 0.4 mg/dL (ref 0.2–1.2)
BUN: 19 mg/dL (ref 6–23)
CALCIUM: 9.1 mg/dL (ref 8.4–10.5)
CHLORIDE: 105 meq/L (ref 96–112)
CO2: 27 meq/L (ref 19–32)
CREATININE: 0.86 mg/dL (ref 0.40–1.20)
GFR: 69.61 mL/min (ref >60.00)
GLUCOSE: 62 mg/dL — AB (ref 70–99)
Potassium: 4.5 mEq/L (ref 3.5–5.1)
Sodium: 139 mEq/L (ref 135–145)
Total Protein: 6.4 g/dL (ref 6.0–8.3)

## 2017-02-12 LAB — CBC WITH DIFFERENTIAL/PLATELET
BASOS ABS: 0.1 10*3/uL (ref 0.0–0.1)
BASOS PCT: 1.3 % (ref 0.0–3.0)
EOS ABS: 0.1 10*3/uL (ref 0.0–0.7)
Eosinophils Relative: 3 % (ref 0.0–5.0)
HEMATOCRIT: 45.2 % (ref 36.0–46.0)
Hemoglobin: 14.9 g/dL (ref 12.0–15.0)
LYMPHS PCT: 23.4 % (ref 12.0–46.0)
Lymphs Abs: 1 10*3/uL (ref 0.7–4.0)
MCHC: 33 g/dL (ref 30.0–36.0)
MCV: 92.2 fl (ref 78.0–100.0)
MONO ABS: 0.4 10*3/uL (ref 0.1–1.0)
Monocytes Relative: 9.3 % (ref 3.0–12.0)
NEUTROS PCT: 63 % (ref 43.0–77.0)
Neutro Abs: 2.8 10*3/uL (ref 1.4–7.7)
PLATELETS: 224 10*3/uL (ref 150.0–400.0)
RBC: 4.9 Mil/uL (ref 3.87–5.11)
RDW: 14 % (ref 11.5–15.5)
WBC: 4.4 10*3/uL (ref 4.0–10.5)

## 2017-02-12 NOTE — Progress Notes (Signed)
Patient presents to clinic today for hospital follow-up. Patient presented to the ER on 02/04/2017 after being sent from PCP office for concern of PE versus other acute cause of SOB. ER workup included  Patient was subsequently admitted to hospital for further assessment. CTA obtained and negative for PE. Did reveal cardiomegaly and pulmonary edema along with a flat nodular density in the right apex. Repeat imaging in 3-6 months recommended per Radiology. Cardiac cath performed 02/06/17 revealing non-obstructive CAD, NICM with LVEF at 15%. Patient was subsequently placed on digoxin, spironolactone, losartan per Cardiology. Lasix was continued. Patient stabilized and discharge with close follow-up with Cardiology/Heart Clinic.  Since discharge, patient endorses taking medications as directed. Denies chest pain, SOB, lightheadedness or dizziness. Still having intermittent pressure. Has noted occasional extra beat. Checking BP daily as directed. Has noted decrease in BP over the past few days. Is now averaging measurements around 90/60. Heart rate has been fluctuating as low as the 50s to the 90s.  Patient is scheduled for follow-up with Heart Failure clinic but not until 02/21/17.Marland Kitchen   Past Medical History:  Diagnosis Date  . Arthritis    ostearthritis -joints, greater left knee.  . Breast cancer, left breast ([place]) 2009   S/P surgery, radiation, chemotherapy.-no further oncology visits.  . CHF (congestive heart failure) ([place]) dx'd 02/05/2017  . Hyperlipidemia   . Neuromuscular disorder ([place])    neuropathy due to chemotherapy  . Personal history of chemotherapy 2009  . Personal history of radiation therapy 2009  . 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 SVT (supraventricular tachycardia) ([facility])    x1 episode -3 yrs ago- no issues since.All heart testing negative. No further cardiac visits needed.    Current Outpatient Prescriptions on File Prior to Visit  Medication Sig Dispense Refill  .  Cholecalciferol (VITAMIN D-3) 1000 units CAPS Take 2 capsules by mouth daily.     . digoxin (LANOXIN) 0.125 MG tablet Take 1 tablet (0.125 mg total) by mouth daily. 30 tablet 1  . furosemide (LASIX) 20 MG tablet Take 1 tablet (20 mg total) by mouth as needed for edema (weight gain). 30 tablet 1  . losartan (COZAAR) 25 MG tablet Take 0.5 tablets (12.5 mg total) by mouth at bedtime. 30 tablet 0  . Magnesium 400 MG TABS Take 1 tablet by mouth daily.     Marland Kitchen spironolactone (ALDACTONE) 25 MG tablet Take 0.5 tablets (12.5 mg total) by mouth daily. 30 tablet 0   No current facility-administered medications on file prior to visit.     Allergies  Allergen Reactions  . Effexor [Venlafaxine] Other (See Comments)    Caused depression, negative thoughts, and crying   . Hydrocodone-Acetaminophen Itching and Nausea And Vomiting    Patient can tolerate with Zofran    Family History  Problem Relation Age of Onset  . Heart disease Mother   . Hypertension Father   . Breast cancer Sister   . Lung cancer Sister   . Skin cancer Brother     Social History   Social History  . Marital status: Married    Spouse name: N/A  . Number of children: N/A  . Years of education: N/A   Social History Main Topics  . Smoking status: Former Smoker    Packs/day: 2.00    Years: 10.00    Types: Cigarettes    Quit date: 07/09/1989  . Smokeless tobacco: Never Used  . Alcohol use No  . Drug use: No  . Sexual  activity: Yes   Other Topics Concern  . None   Social History Narrative  . None   Review of Systems - See HPI.  All other ROS are negative.  BP 96/62   Pulse 97   Temp 97.8 F (36.6 C) (Oral)   Resp 14   Ht '5\' 4"'$  (1.626 m)   Wt 146 lb (66.2 kg)   SpO2 99%   BMI 25.06 kg/m   Physical Exam  Constitutional: She is oriented to person, place, and time and well-developed, well-nourished, and in no distress.  HENT:  Head: Normocephalic and atraumatic.  Eyes: Conjunctivae are normal.  Neck: Neck  supple.  Cardiovascular: Normal rate, regular rhythm, normal heart sounds and intact distal pulses.   Pulses:      Dorsalis pedis pulses are 2+ on the right side, and 2+ on the left side.       Posterior tibial pulses are 2+ on the right side, and 2+ on the left side.  No evidence of peripheral edema on examination  Pulmonary/Chest: Effort normal and breath sounds normal. No respiratory distress. She has no wheezes. She has no rales. She exhibits no tenderness.  Neurological: She is alert and oriented to person, place, and time.  Skin: Skin is warm and dry. No rash noted.  Psychiatric: Affect normal.  Vitals reviewed.  Recent Results (from the past 2160 hour(s))  Lipid panel     Status: Abnormal   Collection Time: 01/03/17  3:14 PM  Result Value Ref Range   Cholesterol 200 0 - 200 mg/dL    Comment: ATP III Classification       Desirable:  < 200 mg/dL               Borderline High:  200 - 239 mg/dL          High:  > = 240 mg/dL   Triglycerides 225.0 (H) 0.0 - 149.0 mg/dL    Comment: Normal:  <150 mg/dLBorderline High:  150 - 199 mg/dL   HDL 45.40 >39.00 mg/dL   VLDL 45.0 (H) 0.0 - 40.0 mg/dL   Total CHOL/HDL Ratio 4     Comment:                Men          Women1/2 Average Risk     3.4          3.3Average Risk          5.0          4.42X Average Risk          9.6          7.13X Average Risk          15.0          11.0                       NonHDL 154.67     Comment: NOTE:  Non-HDL goal should be 30 mg/dL higher than patient's LDL goal (i.e. LDL goal of < 70 mg/dL, would have non-HDL goal of < 100 mg/dL)  Basic metabolic panel     Status: None   Collection Time: 01/03/17  3:14 PM  Result Value Ref Range   Sodium 143 135 - 145 mEq/L   Potassium 3.9 3.5 - 5.1 mEq/L   Chloride 108 96 - 112 mEq/L   CO2 28 19 - 32 mEq/L   Glucose, Bld 75 70 - 99 mg/dL  BUN 21 6 - 23 mg/dL   Creatinine, Ser 9.99 0.40 - 1.20 mg/dL   Calcium 9.2 8.4 - 54.4 mg/dL   GFR 81.35 >38.74 mL/min  TSH     Status:  None   Collection Time: 01/03/17  3:14 PM  Result Value Ref Range   TSH 1.54 0.35 - 4.50 uIU/mL  Hepatic function panel     Status: None   Collection Time: 01/03/17  3:14 PM  Result Value Ref Range   Total Bilirubin 0.4 0.2 - 1.2 mg/dL   Bilirubin, Direct 0.1 0.0 - 0.3 mg/dL   Alkaline Phosphatase 117 39 - 117 U/L   AST 15 0 - 37 U/L   ALT 16 0 - 35 U/L   Total Protein 6.0 6.0 - 8.3 g/dL   Albumin 4.1 3.5 - 5.2 g/dL  CBC with Differential/Platelet     Status: None   Collection Time: 01/03/17  3:14 PM  Result Value Ref Range   WBC 4.9 4.0 - 10.5 K/uL   RBC 4.66 3.87 - 5.11 Mil/uL   Hemoglobin 14.2 12.0 - 15.0 g/dL   HCT 94.1 70.8 - 63.3 %   MCV 89.3 78.0 - 100.0 fl   MCHC 34.1 30.0 - 36.0 g/dL   RDW 45.4 44.9 - 98.2 %   Platelets 189.0 150.0 - 400.0 K/uL   Neutrophils Relative % 66.3 43.0 - 77.0 %   Lymphocytes Relative 21.1 12.0 - 46.0 %   Monocytes Relative 8.9 3.0 - 12.0 %   Eosinophils Relative 2.9 0.0 - 5.0 %   Basophils Relative 0.8 0.0 - 3.0 %   Neutro Abs 3.2 1.4 - 7.7 K/uL   Lymphs Abs 1.0 0.7 - 4.0 K/uL   Monocytes Absolute 0.4 0.1 - 1.0 K/uL   Eosinophils Absolute 0.1 0.0 - 0.7 K/uL   Basophils Absolute 0.0 0.0 - 0.1 K/uL  VITAMIN D 25 Hydroxy (Vit-D Deficiency, Fractures)     Status: Abnormal   Collection Time: 01/03/17  3:14 PM  Result Value Ref Range   VITD 29.66 (L) 30.00 - 100.00 ng/mL  LDL cholesterol, direct     Status: None   Collection Time: 01/03/17  3:14 PM  Result Value Ref Range   Direct LDL 109.0 mg/dL    Comment: Optimal:  <167 mg/dLNear or Above Optimal:  100-129 mg/dLBorderline High:  130-159 mg/dLHigh:  160-189 mg/dLVery High:  >190 mg/dL  Basic metabolic panel     Status: Abnormal   Collection Time: 02/04/17  2:43 PM  Result Value Ref Range   Sodium 139 135 - 145 mmol/L   Potassium 4.0 3.5 - 5.1 mmol/L   Chloride 110 101 - 111 mmol/L   CO2 22 22 - 32 mmol/L   Glucose, Bld 110 (H) 65 - 99 mg/dL   BUN 15 6 - 20 mg/dL   Creatinine, Ser  0.27 0.44 - 1.00 mg/dL   Calcium 9.2 8.9 - 73.8 mg/dL   GFR calc non Af Amer >60 >60 mL/min   GFR calc Af Amer >60 >60 mL/min    Comment: (NOTE) The eGFR has been calculated using the CKD EPI equation. This calculation has not been validated in all clinical situations. eGFR's persistently <60 mL/min signify possible Chronic Kidney Disease.    Anion gap 7 5 - 15  CBC     Status: None   Collection Time: 02/04/17  2:43 PM  Result Value Ref Range   WBC 6.6 4.0 - 10.5 K/uL   RBC 4.76 3.87 - 5.11 MIL/uL  Hemoglobin 14.2 12.0 - 15.0 g/dL   HCT 42.2 36.0 - 46.0 %   MCV 88.7 78.0 - 100.0 fL   MCH 29.8 26.0 - 34.0 pg   MCHC 33.6 30.0 - 36.0 g/dL   RDW 13.8 11.5 - 15.5 %   Platelets 193 150 - 400 K/uL  I-stat troponin, ED     Status: None   Collection Time: 02/04/17  2:58 PM  Result Value Ref Range   Troponin i, poc 0.01 0.00 - 0.08 ng/mL   Comment 3            Comment: Due to the release kinetics of cTnI, a negative result within the first hours of the onset of symptoms does not rule out myocardial infarction with certainty. If myocardial infarction is still suspected, repeat the test at appropriate intervals.   Troponin I (q 6hr x 3)     Status: None   Collection Time: 02/04/17  8:54 PM  Result Value Ref Range   Troponin I <0.03 <0.03 ng/mL  Brain natriuretic peptide     Status: Abnormal   Collection Time: 02/04/17  8:54 PM  Result Value Ref Range   B Natriuretic Peptide 1,065.2 (H) 0.0 - 100.0 pg/mL  Urinalysis, Routine w reflex microscopic     Status: Abnormal   Collection Time: 02/04/17 11:33 PM  Result Value Ref Range   Color, Urine YELLOW YELLOW   APPearance CLEAR CLEAR   Specific Gravity, Urine 1.024 1.005 - 1.030   pH 5.0 5.0 - 8.0   Glucose, UA NEGATIVE NEGATIVE mg/dL   Hgb urine dipstick SMALL (A) NEGATIVE   Bilirubin Urine NEGATIVE NEGATIVE   Ketones, ur NEGATIVE NEGATIVE mg/dL   Protein, ur NEGATIVE NEGATIVE mg/dL   Nitrite NEGATIVE NEGATIVE   Leukocytes, UA  NEGATIVE NEGATIVE   RBC / HPF 0-5 0 - 5 RBC/hpf   WBC, UA 0-5 0 - 5 WBC/hpf   Bacteria, UA NONE SEEN NONE SEEN   Squamous Epithelial / LPF 0-5 (A) NONE SEEN   Mucous PRESENT   CBC     Status: None   Collection Time: 02/05/17  2:59 AM  Result Value Ref Range   WBC 5.6 4.0 - 10.5 K/uL   RBC 4.50 3.87 - 5.11 MIL/uL   Hemoglobin 13.4 12.0 - 15.0 g/dL   HCT 39.8 36.0 - 46.0 %   MCV 88.4 78.0 - 100.0 fL   MCH 29.8 26.0 - 34.0 pg   MCHC 33.7 30.0 - 36.0 g/dL   RDW 13.7 11.5 - 15.5 %   Platelets 160 150 - 400 K/uL  Troponin I (q 6hr x 3)     Status: None   Collection Time: 02/05/17  2:59 AM  Result Value Ref Range   Troponin I <0.03 <0.03 ng/mL  TSH     Status: None   Collection Time: 02/05/17  2:59 AM  Result Value Ref Range   TSH 3.421 0.350 - 4.500 uIU/mL    Comment: Performed by a 3rd Generation assay with a functional sensitivity of <=0.01 uIU/mL.  Troponin I (q 6hr x 3)     Status: None   Collection Time: 02/05/17  7:50 AM  Result Value Ref Range   Troponin I <0.03 <0.03 ng/mL  HIV antibody     Status: None   Collection Time: 02/05/17  8:45 AM  Result Value Ref Range   HIV Screen 4th Generation wRfx Non Reactive Non Reactive    Comment: (NOTE) Performed At: [facility] [address]  [address] [place], [place] [id number] Mila Homer MD IF:[id number]   ECHOCARDIOGRAM COMPLETE     Status: None   Collection Time: 02/05/17  2:15 PM  Result Value Ref Range   Weight 2,338 oz   Height 64.000 in   BP 101/65 mmHg  CBC     Status: None   Collection Time: 02/06/17  6:25 AM  Result Value Ref Range   WBC 5.1 4.0 - 10.5 K/uL   RBC 5.05 3.87 - 5.11 MIL/uL   Hemoglobin 15.0 12.0 - 15.0 g/dL   HCT 47.0 92.9 - 57.4 %   MCV 86.5 78.0 - 100.0 fL   MCH 29.7 26.0 - 34.0 pg   MCHC 34.3 30.0 - 36.0 g/dL   RDW 73.4 03.7 - 09.6 %   Platelets 192 150 - 400 K/uL  Basic metabolic panel     Status: Abnormal   Collection Time: 02/06/17  6:25 AM  Result Value Ref Range   Sodium 137 135  - 145 mmol/L   Potassium 3.4 (L) 3.5 - 5.1 mmol/L   Chloride 106 101 - 111 mmol/L   CO2 23 22 - 32 mmol/L   Glucose, Bld 105 (H) 65 - 99 mg/dL   BUN 17 6 - 20 mg/dL   Creatinine, Ser 4.38 0.44 - 1.00 mg/dL   Calcium 9.2 8.9 - 38.1 mg/dL   GFR calc non Af Amer >60 >60 mL/min   GFR calc Af Amer >60 >60 mL/min    Comment: (NOTE) The eGFR has been calculated using the CKD EPI equation. This calculation has not been validated in all clinical situations. eGFR's persistently <60 mL/min signify possible Chronic Kidney Disease.    Anion gap 8 5 - 15  Protime-INR     Status: None   Collection Time: 02/06/17  6:28 AM  Result Value Ref Range   Prothrombin Time 14.9 11.4 - 15.2 seconds   INR 1.17   Cardiac catheterization     Status: None   Collection Time: 02/06/17  8:53 AM  Result Value Ref Range   Cath EF Quantitative 20 %  I-STAT 3, arterial blood gas (G3+)     Status: Abnormal   Collection Time: 02/06/17  9:02 AM  Result Value Ref Range   pH, Arterial 7.450 7.350 - 7.450   pCO2 arterial 32.4 32.0 - 48.0 mmHg   pO2, Arterial 65.0 (L) 83.0 - 108.0 mmHg   Bicarbonate 22.5 20.0 - 28.0 mmol/L   TCO2 23 0 - 100 mmol/L   O2 Saturation 94.0 %   Acid-base deficit 1.0 0.0 - 2.0 mmol/L   Patient temperature HIDE    Sample type ARTERIAL   I-STAT 3, venous blood gas (G3P V)     Status: Abnormal   Collection Time: 02/06/17  9:06 AM  Result Value Ref Range   pH, Ven 7.405 7.250 - 7.430   pCO2, Ven 38.6 (L) 44.0 - 60.0 mmHg   pO2, Ven 30.0 (LL) 32.0 - 45.0 mmHg   Bicarbonate 24.2 20.0 - 28.0 mmol/L   TCO2 25 0 - 100 mmol/L   O2 Saturation 58.0 %   Patient temperature HIDE    Sample type VENOUS    Comment NOTIFIED PHYSICIAN   I-STAT 3, venous blood gas (G3P V)     Status: Abnormal   Collection Time: 02/06/17  9:07 AM  Result Value Ref Range   pH, Ven 7.411 7.250 - 7.430   pCO2, Ven 39.4 (L) 44.0 - 60.0 mmHg   pO2, Ven 30.0 (LL)  32.0 - 45.0 mmHg   Bicarbonate 25.0 20.0 - 28.0 mmol/L    TCO2 26 0 - 100 mmol/L   O2 Saturation 58.0 %   Patient temperature HIDE    Sample type VENOUS    Comment NOTIFIED PHYSICIAN   CBC     Status: None   Collection Time: 02/07/17  2:38 AM  Result Value Ref Range   WBC 5.9 4.0 - 10.5 K/uL   RBC 4.87 3.87 - 5.11 MIL/uL   Hemoglobin 14.5 12.0 - 15.0 g/dL   HCT 43.1 36.0 - 46.0 %   MCV 88.5 78.0 - 100.0 fL   MCH 29.8 26.0 - 34.0 pg   MCHC 33.6 30.0 - 36.0 g/dL   RDW 13.8 11.5 - 15.5 %   Platelets 207 150 - 400 K/uL  Basic metabolic panel     Status: Abnormal   Collection Time: 02/07/17  2:38 AM  Result Value Ref Range   Sodium 138 135 - 145 mmol/L   Potassium 3.9 3.5 - 5.1 mmol/L   Chloride 106 101 - 111 mmol/L   CO2 24 22 - 32 mmol/L   Glucose, Bld 104 (H) 65 - 99 mg/dL   BUN 19 6 - 20 mg/dL   Creatinine, Ser 0.67 0.44 - 1.00 mg/dL   Calcium 9.2 8.9 - 10.3 mg/dL   GFR calc non Af Amer >60 >60 mL/min   GFR calc Af Amer >60 >60 mL/min    Comment: (NOTE) The eGFR has been calculated using the CKD EPI equation. This calculation has not been validated in all clinical situations. eGFR's persistently <60 mL/min signify possible Chronic Kidney Disease.    Anion gap 8 5 - 15   Assessment/Plan: 1. Acute systolic heart failure ([place]) Now with decreased BP after initiation of multiple medication -- Digoxin, Losartan and Spironolactone. Asymptomatic hypotensive episodes. Has noted some skipped beats. EKG today unchanged from prior readings other than some PACs noted. Will repeat labs today. Will hold Spironolactone for now. Contacted RN at [facility] who endorses she will speak with Dr. Missy Sabins regarding BP levels to see if they can move up paitent's follow-up and for further recommendations in medication changes. Strict ER precautions reviewed with patient who voices understanding.  - CBC w/Diff - Comp Met (CMET) - EKG 12-Lead   Leeanne Rio, PA-C

## 2017-02-12 NOTE — Progress Notes (Signed)
Pre visit review using our clinic review tool, if applicable. No additional management support is needed unless otherwise documented below in the visit note. 

## 2017-02-12 NOTE — Progress Notes (Signed)
Received disability forms from [facility] on 02/08/2017.  Forms will be completed after follow up appointment on 02/21/2017.

## 2017-02-12 NOTE — Telephone Encounter (Signed)
Raiford Noble, PA for [facility] called and spoke with me about seeing patient today in his office. Patient was complaining of HR dropping down into the 50's at times and having low BP.  Patient's vitals were stable in their office today and EKG did show some PAC's.  He wanted me to forward message to Dr. Haroldine Laws letting him know that he is holding patient's spironolactone but wants him to look at EKG and see if he agrees with plan.   I will forward to Dr. Haroldine Laws and Barrington Ellison, PA to review and call the patient back if she needs to restart Spironolactone.

## 2017-02-12 NOTE — Patient Instructions (Addendum)
Please go to the lab for blood work. Stay well hydrated and get plenty of rest. I am waiting on a callback from Dr. Missy Sabins regarding further management and follow-up. For now I would recommend holding the Spironolactone and continuing the other medications as directed to gently raise BP while waiting on their input. I am going to try to expedite your follow-up with them.   Any new or worsening symptoms, please go to the ER.

## 2017-02-14 NOTE — Telephone Encounter (Signed)
Spoke with patient to see if she heard anything from Dr Haroldine Laws office. She has not. I did follow up with her and she states her blood pressure is still running from 76'E systolic to 831 and 51'V-61'Y diastolic. She is feeling stable. No dizziness or weakness. She is out doing regular activity. She is taking Lasix as needed and checking her weight daily. She will notify the office if anything changes. She is agreeable with keeping appointment at 02/21/17.

## 2017-02-14 NOTE — Telephone Encounter (Signed)
  Spironolactone would not affect her HR, but per notes pt BP was low as well.   Per pt call back to Mr Hassell Done office she felt better and chose to continue on her spironolactone. Should stop if her BPs are < 90 systolic or if she feels dizzy with pressures in the 90s. Make sure she is only taking lasix AS NEEDED and that her weight has not trended down s/p discharge.  Keep appt with Dr. Haroldine Laws next week.    Legrand Como [address]" [place], PA-C 02/14/2017 9:19 AM

## 2017-02-18 ENCOUNTER — Other Ambulatory Visit: Payer: Self-pay | Admitting: *Deleted

## 2017-02-18 NOTE — Patient Outreach (Addendum)
[facility]) Care Management  02/18/2017  LASYA VETTER 02-23-1946 [id number]   Subjective: Telephone call to patient's home  / mobile number, no answer, left HIPAA compliant voicemail message, and requested call back.   Objective: Per chart review, patient hospitalized 02/04/17 - 02/07/17 for Acute on chronic systolic (congestive) heart failure.   Patient has a history of pulmonary nodule, CAD, and breast cancer.    Assessment: Received [facility] Transition of care referral on 02/05/17.   Transition of care follow up pending patient contact.    Plan: RNCM will call patient for 3rd telephone outreach attempt, transition of care follow up, within 10 business days if no return call.   Gal Feldhaus H. Annia Friendly, BSN, [place] Management [facility] Telephonic CM Phone: [phone] Fax: [phone]

## 2017-02-19 ENCOUNTER — Encounter: Payer: Self-pay | Admitting: *Deleted

## 2017-02-19 ENCOUNTER — Other Ambulatory Visit: Payer: Self-pay | Admitting: *Deleted

## 2017-02-19 ENCOUNTER — Ambulatory Visit: Payer: Self-pay | Admitting: *Deleted

## 2017-02-19 NOTE — Telephone Encounter (Signed)
Called and spoke with patient she has continued taking her spiro and knows to hold it if her BP is less than 90.  She also stated she only takes lasix if she has a 3 lb or more weight gain overnight or 5 lbs in a week.  Stated she is feeling much better and plans on seeing [place] this Thursday. No further questions.

## 2017-02-19 NOTE — Patient Outreach (Signed)
[facility]) Care Management  02/19/2017  Patricia Collier 03/18/1946 [id number]   Subjective: Received voicemail message from patient, states she is returning call, and requested call back. Telephone call to patient's home / mobile number, spoke with patient, and HIPAA verified.  Discussed [facility] Care Management [facility] Transition of care follow up, patient voiced understanding, and is in agreement to follow up.   Patient states she is doing well, improving, finding her new normal, and currently out shopping at [employer].  States had her follow up appointment with primary provider on 02/12/17 and the appointment went well.  States she is accessing the following [facility] benefits: outpatient pharmacy, hospital indemnity (did not choose this benefit), and has spoken with [employer] today to start ADA (adult disability act, accommodations part of family medical leave act (FMLA) process.  States she has been advised that she is not eligible for FMLA because she has not worked enough hours in the last year.  States she has also started the short term disability process and has spoken with [facility] [employer] regarding her benefit questions.  Patient states she does not have any education material, transition of care, care coordination, disease management, disease monitoring, transportation, community resource, or pharmacy needs at this time. States she is very appreciative of the follow up and is in agreement to receive [facility] Management information.   Objective: Per chart review, patient hospitalized 02/04/17 - 02/07/17 for Acute on chronic systolic (congestive) heart failure.   Patient has a history of pulmonary nodule, CAD, and breast cancer.    Assessment: Received [facility] Transition of care referral on 02/05/17.   Transition of care follow up completed, no care management needs, and will proceed with case closure.    Plan:RNCM will send patient successful outreach letter, [facility]  pamphlet, and magnet. RNCM will send case closure due to follow up completed / no care management needs request to [facility].   Patricia Collier, BSN, [place] Management [facility] Telephonic CM Phone: [phone] Fax: [phone]

## 2017-02-21 ENCOUNTER — Inpatient Hospital Stay (HOSPITAL_COMMUNITY): Payer: 59 | Admitting: Internal Medicine

## 2017-02-21 ENCOUNTER — Ambulatory Visit (HOSPITAL_COMMUNITY)
Admission: RE | Admit: 2017-02-21 | Discharge: 2017-02-21 | Disposition: A | Discharge status: Home / Self Care | Payer: 59 | Source: Ambulatory Visit | Attending: Internal Medicine | Admitting: Internal Medicine

## 2017-02-21 ENCOUNTER — Encounter (HOSPITAL_COMMUNITY): Payer: Self-pay | Admitting: Internal Medicine

## 2017-02-21 VITALS — BP 122/73 | HR 103 | Wt 150.0 lb

## 2017-02-21 DIAGNOSIS — I429 Cardiomyopathy, unspecified: Secondary | ICD-10-CM | POA: Diagnosis not present

## 2017-02-21 DIAGNOSIS — Z923 Personal history of irradiation: Secondary | ICD-10-CM | POA: Diagnosis not present

## 2017-02-21 DIAGNOSIS — I471 Supraventricular tachycardia: Secondary | ICD-10-CM | POA: Diagnosis not present

## 2017-02-21 DIAGNOSIS — I959 Hypotension, unspecified: Secondary | ICD-10-CM | POA: Diagnosis not present

## 2017-02-21 DIAGNOSIS — G709 Myoneural disorder, unspecified: Secondary | ICD-10-CM | POA: Insufficient documentation

## 2017-02-21 DIAGNOSIS — E785 Hyperlipidemia, unspecified: Secondary | ICD-10-CM | POA: Diagnosis not present

## 2017-02-21 DIAGNOSIS — Z9221 Personal history of antineoplastic chemotherapy: Secondary | ICD-10-CM | POA: Insufficient documentation

## 2017-02-21 DIAGNOSIS — R002 Palpitations: Secondary | ICD-10-CM | POA: Diagnosis not present

## 2017-02-21 DIAGNOSIS — G629 Polyneuropathy, unspecified: Secondary | ICD-10-CM | POA: Insufficient documentation

## 2017-02-21 DIAGNOSIS — I509 Heart failure, unspecified: Secondary | ICD-10-CM

## 2017-02-21 DIAGNOSIS — I5022 Chronic systolic (congestive) heart failure: Secondary | ICD-10-CM | POA: Diagnosis not present

## 2017-02-21 DIAGNOSIS — Z87891 Personal history of nicotine dependence: Secondary | ICD-10-CM | POA: Insufficient documentation

## 2017-02-21 DIAGNOSIS — Z853 Personal history of malignant neoplasm of breast: Secondary | ICD-10-CM | POA: Diagnosis not present

## 2017-02-21 LAB — BASIC METABOLIC PANEL
ANION GAP: 9 (ref 5–15)
BUN: 20 mg/dL (ref 6–20)
CHLORIDE: 108 mmol/L (ref 101–111)
CO2: 24 mmol/L (ref 22–32)
Calcium: 9.3 mg/dL (ref 8.9–10.3)
Creatinine, Ser: 0.77 mg/dL (ref 0.44–1.00)
GFR calc Af Amer: >60 mL/min (ref >60)
GLUCOSE: 124 mg/dL — AB (ref 65–99)
POTASSIUM: 4.1 mmol/L (ref 3.5–5.1)
SODIUM: 141 mmol/L (ref 135–145)

## 2017-02-21 LAB — MAGNESIUM: Magnesium: 2.2 mg/dL (ref 1.7–2.4)

## 2017-02-21 LAB — DIGOXIN LEVEL: DIGOXIN LVL: 0.4 ng/mL — AB (ref 0.8–2.0)

## 2017-02-21 MED ORDER — IVABRADINE HCL 5 MG PO TABS: 2.5000 mg | ORAL_TABLET | Freq: Two times a day (BID) | ORAL | 3 refills | Status: DC

## 2017-02-21 NOTE — Patient Instructions (Signed)
Start Corlanor 1/2 tablet twice daily.  Routine lab work today. Will notify you of abnormal results  Follow up with Dr.Bensimhon in 2 weeks.   Your provider requests you have a 48 hour holter monitor.

## 2017-02-21 NOTE — Progress Notes (Signed)
Advanced Heart Failure Clinic Note   Referring Physician:  Primary Care: Primary Cardiologist:  HPI: Ms Pyeatt is a 71 year old with a history of breast cancer s/p left lumpectomy T1No Grade III invasive ductal carcinoma and received dose-dense doxorubicin in 2009 and cyclophosphamidex4, paclitaxel x12, SVT, and former smoker (quit 20 years ago). 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   Admitted 02/04/17-02/07/17 with new onset acute systolic CHF. EF 15-20%. Right and left heart cath done and showed Fick output/index 3.1/1.8. No CAD. She was started on losartan, Spiro and digoxin. Discharge weight was 140 pounds.   She presents today for HF follow up. Last week she was seen by her PCP and was feeling poorly, felt like she was having some PVC's, and also felt like she was having some pauses (she was listening with her stethoscope). Today, she feels ok, but her weight has increased about 10 pounds since discharge and thinks that her feet are more swollen. Weights have been labile at home. She has taken lasix 3 times in the past 2 weeks. Denies orthopnea, PND and chest pain. She has chest pressure in the center of her chest. She takes her blood pressures at home 3 times a day and BP's are as low as 78/57 at times. She denies dizziness and orthostatic symptoms.    Review of Systems: [y] = yes, [ ]  = no   General: Weight gain [ y]; Weight loss [ ] ; Anorexia [ ] ; Fatigue [ y]; Fever [ ] ; Chills [ ] ; Weakness [ ]   Cardiac: Chest pain/pressure [ ] ; Resting SOB [ ] ; Exertional SOB [ ] ; Orthopnea [ ] ; Pedal Edema Blue.Reese ]; Palpitations [ ] ; Syncope [ ] ; Presyncope [ ] ; Paroxysmal nocturnal dyspnea[ ]   Pulmonary: Cough [ ] ; Wheezing[ ] ; Hemoptysis[ ] ; Sputum [ ] ; Snoring [ ]   GI: Vomiting[ ] ; Dysphagia[ ] ; Melena[ ] ; Hematochezia [ ] ; Heartburn[ ] ; Abdominal pain [ ] ; Constipation [ ] ; Diarrhea [ ] ; BRBPR [ ]   GU: Hematuria[ ] ; Dysuria [ ] ; Nocturia[ ]   Vascular: Pain in  legs with walking [ ] ; Pain in feet with lying flat [ ] ; Non-healing sores [ ] ; Stroke [ ] ; TIA [ ] ; Slurred speech [ ] ;  Neuro: Headaches[ ] ; Vertigo[ ] ; Seizures[ ] ; Paresthesias[ ] ;Blurred vision [ ] ; Diplopia [ ] ; Vision changes [ ]   Ortho/Skin: Arthritis Blue.Reese ]; Joint pain [ ] ; Muscle pain [ ] ; Joint swelling [ ] ; Back Pain [ ] ; Rash [ ]   Psych: Depression[ ] ; Anxiety[ ]   Heme: Bleeding problems [ ] ; Clotting disorders [ ] ; Anemia [ ]   Endocrine: Diabetes [ ] ; Thyroid dysfunction[ ]    Past Medical History:  Diagnosis Date  . Arthritis    ostearthritis -joints, greater left knee.  . Breast cancer, left breast ([place]) 2009   S/P surgery, radiation, chemotherapy.-no further oncology visits.  . CHF (congestive heart failure) ([place]) dx'd 02/05/2017  . Hyperlipidemia   . Neuromuscular disorder ([place])    neuropathy due to chemotherapy  . Personal history of chemotherapy 2009  . Personal history of radiation therapy 2009  . Seizures ([place])    "childbirth related"  . SVT (supraventricular tachycardia) (HCC)    x1 episode -3 yrs ago- no issues since.All heart testing negative. No further cardiac visits needed.    Current Outpatient Prescriptions  Medication Sig Dispense Refill  . Cholecalciferol (VITAMIN D-3) 1000 units CAPS Take 2 capsules by mouth daily.     . digoxin (LANOXIN)  0.125 MG tablet Take 1 tablet (0.125 mg total) by mouth daily. 30 tablet 1  . losartan (COZAAR) 25 MG tablet Take 0.5 tablets (12.5 mg total) by mouth at bedtime. 30 tablet 0  . Magnesium 400 MG TABS Take 1 tablet by mouth daily.     Marland Kitchen spironolactone (ALDACTONE) 25 MG tablet Take 0.5 tablets (12.5 mg total) by mouth daily. 30 tablet 0  . furosemide (LASIX) 20 MG tablet Take 1 tablet (20 mg total) by mouth as needed for edema (weight gain). (Patient not taking: Reported on 02/21/2017) 30 tablet 1   No current facility-administered medications for this encounter.     Allergies  Allergen Reactions  . Effexor  [Venlafaxine] Other (See Comments)    Caused depression, negative thoughts, and crying   . Hydrocodone-Acetaminophen Itching and Nausea And Vomiting    Patient can tolerate with Zofran      Social History   Social History  . Marital status: Married    Spouse name: N/A  . Number of children: N/A  . Years of education: N/A   Occupational History  . Not on file.   Social History Main Topics  . Smoking status: Former Smoker    Packs/day: 2.00    Years: 10.00    Types: Cigarettes    Quit date: 07/09/1989  . Smokeless tobacco: Never Used  . Alcohol use No  . Drug use: No  . Sexual activity: Yes   Other Topics Concern  . Not on file   Social History Narrative  . No narrative on file      Family History  Problem Relation Age of Onset  . Heart disease Mother   . Hypertension Father   . Breast cancer Sister   . Lung cancer Sister   . Skin cancer Brother     Vitals:   02/21/17 1533  BP: 122/73  Pulse: (!) 103  SpO2: 99%  Weight: 150 lb (68 kg)     PHYSICAL EXAM: General:  Well appearing. No respiratory difficulty HEENT: normal Neck: supple. 5- 6 cm JVD Carotids 2+ bilat; no bruits. No lymphadenopathy or thyromegaly appreciated. Cor: PMI nondisplaced. Regular rate & rhythm. 3/6 MR murmur.  Lungs: clear Abdomen: soft, nontender, nondistended. No hepatosplenomegaly. No bruits or masses. Good bowel sounds. Extremities: no cyanosis, clubbing, rash. Trace pedal edema bilaterally.  Neuro: alert & oriented x 3, cranial nerves grossly intact. moves all 4 extremities w/o difficulty. Affect pleasant.     ASSESSMENT & PLAN:  1. Chronic systolic CHF: NICM, EF 16-10%, felt to be related to doxorubicin therapy.  - NYHA III - Volume status elevated on exam.  - Continue Spiro 12.5 mg hs - Continue losartan 12.5 mg hs - Continue digoxin 0.125 mg  - Hypotension prohibits uptitration of meds. Will add 2.5 mg Corlanor BID to reduce heart rate and gain some filling time.  -  She is tenuous, she does a really great job of making sure that she appears she is doing well but she was significantly dyspneic with walking back to clinic today. Worried that she might need advanced therapies soon.  - BMET, Mg today.   2. Palpitations - will place 48 hour monitor.  - Check Mg today.    Follow up with Dr. Haroldine Laws in 2 weeks.   Arbutus Leas, NP 02/21/17   Patient seen and examined with Jettie Booze, NP. We discussed all aspects of the encounter. I agree with the assessment and plan as stated above.   She  remains extremely tenuous in the setting of recently diagnosed systolic HD likely due to delayed-onset adriamycin toxicity. Today she is volume overloaded, tachycardic and likely has marginal cardiac output. That said, she is managing her daily activities. She will take lasix today. Start corlanor. She knows to call me immediately with worsening symptoms. Place monitor to assess palpitations.   Glori Bickers, MD  8:34 PM

## 2017-03-01 ENCOUNTER — Other Ambulatory Visit (HOSPITAL_COMMUNITY): Payer: Self-pay | Admitting: Internal Medicine

## 2017-03-01 ENCOUNTER — Ambulatory Visit (INDEPENDENT_AMBULATORY_CARE_PROVIDER_SITE_OTHER): Payer: 59

## 2017-03-01 ENCOUNTER — Encounter (HOSPITAL_COMMUNITY): Payer: Self-pay | Admitting: *Deleted

## 2017-03-01 ENCOUNTER — Telehealth (HOSPITAL_COMMUNITY): Payer: Self-pay | Admitting: Pharmacist

## 2017-03-01 DIAGNOSIS — R002 Palpitations: Secondary | ICD-10-CM

## 2017-03-01 DIAGNOSIS — I509 Heart failure, unspecified: Secondary | ICD-10-CM

## 2017-03-01 MED FILL — CORLANOR 5 MG TABLET: 5 | 30 days supply | Qty: 30 | Fill #0

## 2017-03-01 NOTE — Progress Notes (Signed)
Received disability form from [employer] on 02/08/2017.  Forms completed/signed and faxed today to [phone]. Claim # [id number]  Original forms will be scanned to patient's electronic medical record.

## 2017-03-01 NOTE — Progress Notes (Signed)
Received forms from [place] on 02/22/2017.  Forms completed/signed and faxed today to [phone]. Claim # [id number].  Original forms will be scanned to patient's electronic medical record.

## 2017-03-01 NOTE — Progress Notes (Signed)
Received FMLA papers from [employer] on 02/19/2017.  Forms completed/signed and faxed to [phone] today.    Original forms will be scanned to patient's electronic medical record.

## 2017-03-01 NOTE — Progress Notes (Signed)
Received medical certification form from [employer].  Form completed/signed and faxed today to [phone].  Original form will be scanned to patient's electronic medical record.

## 2017-03-01 NOTE — Telephone Encounter (Signed)
Corlanor 5 mg PA approved by [employer] through 02/28/18.   Ruta Hinds. Velva Harman, PharmD, BCPS, CPP Clinical Pharmacist Pager: [phone] Phone: [phone] 03/01/2017 3:49 PM

## 2017-03-05 MED FILL — DIGOXIN 0.125 MG TABLET: 125 | 30 days supply | Qty: 30 | Fill #1

## 2017-03-14 ENCOUNTER — Ambulatory Visit (HOSPITAL_COMMUNITY)
Admission: RE | Admit: 2017-03-14 | Discharge: 2017-03-14 | Disposition: A | Discharge status: Home / Self Care | Payer: 59 | Source: Ambulatory Visit | Attending: Internal Medicine | Admitting: Internal Medicine

## 2017-03-14 ENCOUNTER — Encounter (HOSPITAL_COMMUNITY): Payer: Self-pay | Admitting: Internal Medicine

## 2017-03-14 VITALS — BP 98/72 | HR 80 | Wt 149.4 lb

## 2017-03-14 DIAGNOSIS — I340 Nonrheumatic mitral (valve) insufficiency: Secondary | ICD-10-CM | POA: Insufficient documentation

## 2017-03-14 DIAGNOSIS — I493 Ventricular premature depolarization: Secondary | ICD-10-CM | POA: Diagnosis not present

## 2017-03-14 DIAGNOSIS — Z79899 Other long term (current) drug therapy: Secondary | ICD-10-CM | POA: Insufficient documentation

## 2017-03-14 DIAGNOSIS — Z87891 Personal history of nicotine dependence: Secondary | ICD-10-CM | POA: Diagnosis not present

## 2017-03-14 DIAGNOSIS — R002 Palpitations: Secondary | ICD-10-CM | POA: Diagnosis not present

## 2017-03-14 DIAGNOSIS — E785 Hyperlipidemia, unspecified: Secondary | ICD-10-CM | POA: Insufficient documentation

## 2017-03-14 DIAGNOSIS — I959 Hypotension, unspecified: Secondary | ICD-10-CM | POA: Insufficient documentation

## 2017-03-14 DIAGNOSIS — I5022 Chronic systolic (congestive) heart failure: Secondary | ICD-10-CM | POA: Insufficient documentation

## 2017-03-14 DIAGNOSIS — I471 Supraventricular tachycardia: Secondary | ICD-10-CM | POA: Diagnosis not present

## 2017-03-14 DIAGNOSIS — Z853 Personal history of malignant neoplasm of breast: Secondary | ICD-10-CM | POA: Diagnosis not present

## 2017-03-14 DIAGNOSIS — I428 Other cardiomyopathies: Secondary | ICD-10-CM | POA: Insufficient documentation

## 2017-03-14 LAB — CBC
HEMATOCRIT: 42.4 % (ref 36.0–46.0)
HEMOGLOBIN: 14.6 g/dL (ref 12.0–15.0)
MCH: 30.9 pg (ref 26.0–34.0)
MCHC: 34.4 g/dL (ref 30.0–36.0)
MCV: 89.6 fL (ref 78.0–100.0)
PLATELETS: 193 10*3/uL (ref 150–400)
RBC: 4.73 MIL/uL (ref 3.87–5.11)
RDW: 13.3 % (ref 11.5–15.5)
WBC: 7.3 10*3/uL (ref 4.0–10.5)

## 2017-03-14 LAB — BASIC METABOLIC PANEL
ANION GAP: 8 (ref 5–15)
BUN: 16 mg/dL (ref 6–20)
CHLORIDE: 106 mmol/L (ref 101–111)
CO2: 25 mmol/L (ref 22–32)
Calcium: 9.4 mg/dL (ref 8.9–10.3)
Creatinine, Ser: 0.73 mg/dL (ref 0.44–1.00)
GFR calc non Af Amer: >60 mL/min (ref >60)
GLUCOSE: 98 mg/dL (ref 65–99)
POTASSIUM: 3.9 mmol/L (ref 3.5–5.1)
Sodium: 139 mmol/L (ref 135–145)

## 2017-03-14 LAB — BRAIN NATRIURETIC PEPTIDE: B Natriuretic Peptide: 431.4 pg/mL — ABNORMAL HIGH (ref 0.0–100.0)

## 2017-03-14 MED ORDER — FUROSEMIDE 20 MG PO TABS: ORAL_TABLET | ORAL | 3 refills | Status: DC

## 2017-03-14 MED ORDER — AMIODARONE HCL 200 MG PO TABS: ORAL_TABLET | ORAL | 3 refills | Status: DC

## 2017-03-14 MED FILL — FUROSEMIDE 20 MG TABLET: 20 | 84 days supply | Qty: 24 | Fill #0

## 2017-03-14 MED FILL — AMIODARONE HCL 200 MG TAB: 200 | 76 days supply | Qty: 90 | Fill #0

## 2017-03-14 NOTE — Patient Instructions (Addendum)
START Amiodarone 200 mg (1 tab) twice daily for two weeks, then reduce dose to 200 mg (1 tab) once daily.  INCREASE Lasix to 20 mg tablet once on Monday and Friday mornings.  Routine lab work today. Will notify you of abnormal results, otherwise no news is good news!  Follow up 2-3 weeks.  _______________________________________________________________  _______________________________________________________________  Take all medication as prescribed the day of your appointment. Bring all medications with you to your appointment.  Do the following things EVERYDAY: 1) Weigh yourself in the morning before breakfast. Write it down and keep it in a log. 2) Take your medicines as prescribed 3) Eat low salt foods-Limit salt (sodium) to 2000 mg per day.  4) Stay as active as you can everyday 5) Limit all fluids for the day to less than 2 liters

## 2017-03-14 NOTE — Progress Notes (Signed)
Advanced Heart Failure Clinic Note   Primary Cardiologist: Dr. 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   Admitted 02/04/17-02/07/17 with new onset acute systolic CHF. EF 15-20%. Right and left heart cath done and showed Fick output/index 3.1/1.8. No CAD. She was started on losartan, Spiro and digoxin. Discharge weight was 140 pounds.   She presents today for regular follow up. She feels about the same. Continues to have intermittent chest pain throughout the day.  No relation to activity, and not necessarily relieved by rest. She is SOB with mild exertion. No SOB with her daily activities, but SOB with stairs and after walking < 1 block. Denies swelling in her feet, hasn't needed her lasix. She has taken lasix 4 times in the past 3 weeks. Weight at home stable 148 -151. BPs as low as 73X systolic. She denies dizziness, lightheadedness, or near syncope. States these symptoms have "NEVER" happened to her. She is experiencing phosphene visual changes on corlanor. She works in Conservation officer, historic buildings. She states overall she would rate how she feels a 7/10 (10 being th ebest)  Echo 02/05/17 LVEF 15-20%, Grade 1 DD, Mild/Mod central MR, Mild LAE  R/LHC 02/06/17  There is severe left ventricular systolic dysfunction.  Dist Cx lesion, 20 %stenosed.  1st Mrg lesion, 60 %stenosed.  Dist RCA lesion, 20 %stenosed.  Mid LAD to Dist LAD lesion, 20 %stenosed.   Findings:  Ao = 80/56 (68) LV = 86/14 RA = 2  RV = 22/2 PA = 24/11 (17) PCW = 6 Fick cardiac output/index = 3.1/1.8 PVR = 3.6 WU Ao sat = 94% PA sat = 58%, 58%  Review of systems complete and found to be negative unless listed in  HPI.    Past Medical History:  Diagnosis Date  . Arthritis    ostearthritis -joints, greater left knee.  . Breast cancer, left breast ([place]) 2009   S/P surgery, radiation, chemotherapy.-no further oncology visits.  . CHF (congestive heart failure) ([place]) dx'd 02/05/2017  . Hyperlipidemia   . Neuromuscular disorder ([place])    neuropathy due to chemotherapy  . Personal history of chemotherapy 2009  . Personal history of radiation therapy 2009  . Seizures ([place])    "childbirth related"  . SVT (supraventricular tachycardia) (HCC)    x1 episode -3 yrs ago- no issues since.All heart testing negative. No further cardiac visits needed.    Current Outpatient Prescriptions  Medication Sig Dispense Refill  . Cholecalciferol (VITAMIN D-3) 1000 units CAPS Take 2 capsules by mouth daily.     . digoxin (LANOXIN) 0.125 MG tablet Take 1 tablet (0.125 mg total) by mouth daily. 30 tablet 1  . furosemide (LASIX) 20 MG tablet Take 1 tablet (20 mg total) by mouth as needed for edema (weight gain). 30 tablet 1  . ivabradine (CORLANOR) 5 MG TABS tablet Take 0.5 tablets (2.5 mg total) by mouth 2 (two) times daily with a meal. 30 tablet 3  . losartan (COZAAR) 25 MG tablet Take 0.5 tablets (12.5 mg total) by mouth at bedtime. 30 tablet 0  . Magnesium 400 MG TABS Take 1 tablet by mouth daily.     Marland Kitchen spironolactone (ALDACTONE) 25 MG tablet Take 0.5  tablets (12.5 mg total) by mouth daily. 30 tablet 0   No current facility-administered medications for this encounter.     Allergies  Allergen Reactions  . Effexor [Venlafaxine] Other (See Comments)    Caused depression, negative thoughts, and crying   . Hydrocodone-Acetaminophen Itching and Nausea And Vomiting    Patient can tolerate with Zofran      Social History   Social History  . Marital status: Married    Spouse name: N/A  . Number of children: N/A  . Years of education: N/A   Occupational History  . Not on file.   Social History Main Topics  .  Smoking status: Former Smoker    Packs/day: 2.00    Years: 10.00    Types: Cigarettes    Quit date: 07/09/1989  . Smokeless tobacco: Never Used  . Alcohol use No  . Drug use: No  . Sexual activity: Yes   Other Topics Concern  . Not on file   Social History Narrative  . No narrative on file      Family History  Problem Relation Age of Onset  . Heart disease Mother   . Hypertension Father   . Breast cancer Sister   . Lung cancer Sister   . Skin cancer Brother     Vitals:   03/14/17 1521 03/14/17 1529  BP: 138/82 122/78  Pulse: 76   SpO2: 91%   Weight: 149 lb 6.4 oz (67.8 kg)    Wt Readings from Last 3 Encounters:  03/14/17 149 lb 6.4 oz (67.8 kg)  02/21/17 150 lb (68 kg)  02/12/17 146 lb (66.2 kg)    PHYSICAL EXAM: General: Well appearing. No resp difficulty. HEENT: Normal Neck: Supple. JVP 6-7 cm. Carotids 2+ bilat; no bruits. No thyromegaly or nodule noted. Cor: PMI nondisplaced. RRR, 3/6 MR murmur. +S3 Lungs: CTAB, normal effort. Abdomen: Soft, non-tender, non-distended, no HSM. No bruits or masses. +BS  Extremities: No cyanosis, clubbing, or rash. Trace pedal edema bilaterally.  Neuro: Alert & orientedx3, cranial nerves grossly intact. moves all 4 extremities w/o difficulty. Affect pleasant   ASSESSMENT & PLAN:  1. Chronic systolic CHF: NICM, EF 85-27%, felt to be related to doxorubicin therapy.  - NYHA class III symptoms.  - Volume status looks OK on exam.  - Continue Spiro 12.5 mg hs - Continue losartan 12.5 mg hs - Increase lasix to 20 mg Monday and Friday. - Continue digoxin 0.125 mg daily. Level stable 02/21/17. - Hypotension has prohibited uptitration of meds.  - Continue corlanor 2.5 mg BID.    2. Palpitations - Holter monitor with PVC burden <1%. By her symptoms, suspect this was underestimated.  - Add amiodarone 200 mg BID x 2 weeks then do 200 mg daily moving forward for PVC suppression.   3. Mitral Regurgitation - Mild/Mod on Echo. Suspect  functional. Focus on GDMT for her CHF.   Stable currently, but very tenuous. Gently increase meds as above.  She is very close to advance therapy consideration. Keep close follow up for the time being. Labs today.   Shirley Friar, PA-C 03/14/17   Patient seen and examined with the above-signed Advanced Practice Provider and/or Housestaff. I personally reviewed laboratory data, imaging studies and relevant notes. I independently examined the patient and formulated the important aspects of the plan. I have edited the note to reflect any of my changes or salient points. I have personally discussed the plan with the patient and/or family.  She remains quite tenuous  though she is clearly minimizing her symptoms. NYHA III. Volume status looks ok. Continues to be tachycardic and struggle with frequent PVCs. Holter monitor reviewed with her ans shows only 1% PVCs but seem to be more frequent here and by report. Will start amiodarone. Increase lasix slightly. BP otherwise too soft to titrate meds further. Continue to follow closely for need for advanced therapies. Repeat echo soon. Will need repeat RHC if getting any worse.   Glori Bickers, MD  8:51 PM

## 2017-03-18 ENCOUNTER — Encounter (HOSPITAL_COMMUNITY): Payer: Self-pay | Admitting: *Deleted

## 2017-03-18 NOTE — Progress Notes (Signed)
Received medical records request on 03/15/17 from [facility]  Requested records faxed today to [phone].  Claim# [id number].  Original request will be scanned to patient's electronic medical record.

## 2017-04-02 ENCOUNTER — Ambulatory Visit (HOSPITAL_COMMUNITY)
Admission: RE | Admit: 2017-04-02 | Discharge: 2017-04-02 | Disposition: A | Discharge status: Home / Self Care | Payer: 59 | Source: Ambulatory Visit | Attending: Cardiology | Admitting: Cardiology

## 2017-04-02 VITALS — BP 98/68 | HR 74 | Wt 150.4 lb

## 2017-04-02 DIAGNOSIS — Z87891 Personal history of nicotine dependence: Secondary | ICD-10-CM | POA: Insufficient documentation

## 2017-04-02 DIAGNOSIS — I517 Cardiomegaly: Secondary | ICD-10-CM | POA: Insufficient documentation

## 2017-04-02 DIAGNOSIS — I5022 Chronic systolic (congestive) heart failure: Secondary | ICD-10-CM | POA: Diagnosis not present

## 2017-04-02 DIAGNOSIS — I493 Ventricular premature depolarization: Secondary | ICD-10-CM | POA: Diagnosis not present

## 2017-04-02 DIAGNOSIS — Z9221 Personal history of antineoplastic chemotherapy: Secondary | ICD-10-CM | POA: Diagnosis not present

## 2017-04-02 DIAGNOSIS — M1990 Unspecified osteoarthritis, unspecified site: Secondary | ICD-10-CM | POA: Insufficient documentation

## 2017-04-02 DIAGNOSIS — Z923 Personal history of irradiation: Secondary | ICD-10-CM | POA: Diagnosis not present

## 2017-04-02 DIAGNOSIS — Z853 Personal history of malignant neoplasm of breast: Secondary | ICD-10-CM | POA: Insufficient documentation

## 2017-04-02 DIAGNOSIS — E785 Hyperlipidemia, unspecified: Secondary | ICD-10-CM | POA: Diagnosis not present

## 2017-04-02 DIAGNOSIS — R002 Palpitations: Secondary | ICD-10-CM | POA: Insufficient documentation

## 2017-04-02 DIAGNOSIS — I340 Nonrheumatic mitral (valve) insufficiency: Secondary | ICD-10-CM | POA: Insufficient documentation

## 2017-04-02 LAB — DIGOXIN LEVEL: DIGOXIN LVL: 0.6 ng/mL — AB (ref 0.8–2.0)

## 2017-04-02 LAB — BASIC METABOLIC PANEL
ANION GAP: 7 (ref 5–15)
BUN: 26 mg/dL — AB (ref 6–20)
CHLORIDE: 101 mmol/L (ref 101–111)
CO2: 26 mmol/L (ref 22–32)
Calcium: 9.4 mg/dL (ref 8.9–10.3)
Creatinine, Ser: 0.93 mg/dL (ref 0.44–1.00)
GFR calc Af Amer: >60 mL/min (ref >60)
GLUCOSE: 89 mg/dL (ref 65–99)
POTASSIUM: 3.8 mmol/L (ref 3.5–5.1)
Sodium: 134 mmol/L — ABNORMAL LOW (ref 135–145)

## 2017-04-02 MED ORDER — DIGOXIN 125 MCG PO TABS: 0.1250 mg | ORAL_TABLET | Freq: Every day | ORAL | 1 refills | Status: DC

## 2017-04-02 MED ORDER — LOSARTAN POTASSIUM 25 MG PO TABS: 12.5000 mg | ORAL_TABLET | Freq: Every day | ORAL | 0 refills | Status: DC

## 2017-04-02 MED ORDER — SPIRONOLACTONE 25 MG PO TABS: 12.5000 mg | ORAL_TABLET | Freq: Every day | ORAL | 0 refills | Status: DC

## 2017-04-02 MED ORDER — AMIODARONE HCL 100 MG PO TABS: 100.0000 mg | ORAL_TABLET | Freq: Every day | ORAL | 3 refills | Status: DC

## 2017-04-02 MED FILL — LOSARTAN POTASSIUM 25 MG TA: 25 | 30 days supply | Qty: 30 | Fill #0

## 2017-04-02 MED FILL — SPIRONOLACTONE 25 MG TABLET: 25 | 60 days supply | Qty: 30 | Fill #0

## 2017-04-02 MED FILL — DIGOXIN 0.125 MG TABLET: 125 | 30 days supply | Qty: 30 | Fill #0

## 2017-04-02 NOTE — Progress Notes (Signed)
Advanced Heart Failure Clinic Note   Primary Cardiologist: Dr. 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   Admitted 02/04/17-02/07/17 with new onset acute systolic CHF. EF 15-20%. Right and left heart cath done and showed Fick output/index 3.1/1.8. No CAD. She was started on losartan, Spiro and digoxin. Discharge weight was 140 pounds.   She returns today for HF follow up. At last visit, she was noted to have frequent PVC's on exam, also with awareness of her PVC's and started on Amiodarone. Since starting amio, she has noted bradycardia only at night, when she checks her HR with her pulse ox. She then confirms the low HR (low as 30-40's) by checking her carotid pulse. This resolves when she sits up. Also notes chest pressure when HR is in the 30-40's. Bradycardia lasts about a minute or until she sits up. Weights stable at home at 147-150 pounds. Denies orthopnea, PND, lower extremity edema. Taking all medications. Currently not working and wants to go back to work at the end of Oct. 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 Denies SOB, can walk around the block without SOB but does continue to feel fatigued with activity.   Echo 02/05/17 LVEF 15-20%, Grade 1 DD, Mild/Mod central MR, Mild LAE  R/LHC 02/06/17  There is severe left ventricular systolic dysfunction.  Dist Cx lesion, 20 %stenosed.  1st Mrg lesion, 60 %stenosed.  Dist RCA lesion, 20 %stenosed.  Mid LAD to Dist LAD lesion, 20 %stenosed.   Findings:  Ao = 80/56 (68) LV = 86/14 RA = 2  RV = 22/2 PA = 24/11 (17) PCW = 6 Fick  cardiac output/index = 3.1/1.8 PVR = 3.6 WU Ao sat = 94% PA sat = 58%, 58%  Review of systems complete and found to be negative unless listed in HPI.    Past Medical History:  Diagnosis Date  . Arthritis    ostearthritis -joints, greater left knee.  . Breast cancer, left breast ([place]) 2009   S/P surgery, radiation, chemotherapy.-no further oncology visits.  . CHF (congestive heart failure) ([place]) dx'd 02/05/2017  . Hyperlipidemia   . Neuromuscular disorder ([place])    neuropathy due to chemotherapy  . Personal history of chemotherapy 2009  . Personal history of radiation therapy 2009  . Seizures ([place])    "childbirth related"  . SVT (supraventricular tachycardia) ([facility])    x1 episode -3 yrs ago- no issues since.All heart testing negative. No further cardiac visits needed.    Current Outpatient Prescriptions  Medication Sig Dispense Refill  . amiodarone (PACERONE) 200 MG tablet Take 200 mg (1 tab) twice daily for TWO WEEKS, then reduce to 200 mg once daily. 90 tablet 3  . Cholecalciferol (VITAMIN D-3) 1000 units CAPS Take 2 capsules by mouth daily.     . digoxin (LANOXIN) 0.125 MG tablet Take 1 tablet (0.125 mg total) by mouth daily. 30 tablet 1  . furosemide (LASIX) 20 MG tablet Take 20 mg (1 tab) once on Monday and Friday mornings. 30 tablet 3  . ivabradine (CORLANOR) 5 MG TABS tablet Take 0.5 tablets (  2.5 mg total) by mouth 2 (two) times daily with a meal. 30 tablet 3  . losartan (COZAAR) 25 MG tablet Take 0.5 tablets (12.5 mg total) by mouth at bedtime. 30 tablet 0  . Magnesium 400 MG TABS Take 1 tablet by mouth daily.     Marland Kitchen spironolactone (ALDACTONE) 25 MG tablet Take 0.5 tablets (12.5 mg total) by mouth daily. 30 tablet 0   No current facility-administered medications for this encounter.     Allergies  Allergen Reactions  . Effexor [Venlafaxine] Other (See Comments)    Caused depression, negative thoughts, and crying   . Hydrocodone-Acetaminophen Itching and Nausea And  Vomiting    Patient can tolerate with Zofran      Social History   Social History  . Marital status: Married    Spouse name: N/A  . Number of children: N/A  . Years of education: N/A   Occupational History  . Not on file.   Social History Main Topics  . Smoking status: Former Smoker    Packs/day: 2.00    Years: 10.00    Types: Cigarettes    Quit date: 07/09/1989  . Smokeless tobacco: Never Used  . Alcohol use No  . Drug use: No  . Sexual activity: Yes   Other Topics Concern  . Not on file   Social History Narrative  . No narrative on file      Family History  Problem Relation Age of Onset  . Heart disease Mother   . Hypertension Father   . Breast cancer Sister   . Lung cancer Sister   . Skin cancer Brother     Vitals:   04/02/17 1508  BP: 98/68  Pulse: 74  SpO2: 98%  Weight: 150 lb 6.4 oz (68.2 kg)   Wt Readings from Last 3 Encounters:  04/02/17 150 lb 6.4 oz (68.2 kg)  03/14/17 149 lb 6.4 oz (67.8 kg)  02/21/17 150 lb (68 kg)    PHYSICAL EXAM: General: Well appearing. No resp difficulty. HEENT: Normal Neck: Supple. JVP 5-6. Carotids 2+ bilat; no bruits. No thyromegaly or nodule noted. Cor: PMI nondisplaced. RRR, 2/6 HSM at LUSB.  Lungs: CTAB, normal effort. Abdomen: Soft, non-tender, non-distended, no HSM. No bruits or masses. +BS  Extremities: No cyanosis, clubbing, rash, R and LLE no edema.  Neuro: Alert & orientedx3, cranial nerves grossly intact. moves all 4 extremities w/o difficulty. Affect pleasant   ASSESSMENT & PLAN:  1. Chronic systolic CHF: NICM, EF 94-85%, felt to be related to doxorubicin therapy. But also with frequent PVC's, holter monitor showed 1% PVC's but patient complained of very frequent PVC's.  - NYHA II-III. - Volume stable on exam.  - Continue lasix 20 mg of Monday and Friday.  - Continue Spiro 12.5 mg hs - Continue losartan 12.5 mg hs - Continue digoxin 0.125 mg daily. Check level today. - Continue corlanor for  now, however may need to stop with bradycardia. Reduce Amio to 100 mg dialy.  I have asked her to keep a log of her HR (she is already doing so), and call us next week. If continues to have bradycardia will stop corlanor.  - Further medication titrations prohibited by bradycardia.   2. Palpitations - Holter monitor with PVC burden <1%. Per Dr. Haroldine Laws: By her symptoms, suspect this was underestimated.  - Reduce amio to 100 mg day as above.   3. Mitral Regurgitation - Mild to moderate on Echo.   Follow up in one month with an  Echo. Remains tenuous. BMET, dig level today.    Arbutus Leas, NP 04/02/17

## 2017-04-02 NOTE — Patient Instructions (Signed)
DECREASE Amiodarone to 100 mg once daily. Can half your current 200 mg tablets you have at home (take 1/2 tablet once daily). New Rx has been sent to your pharmacy for 100 mg tablets (Take 1 tablet once daily).  Monitor heart rate for 1 week, call CHF clinic to report trends. [phone] Option 5  Routine lab work today. Will notify you of abnormal results, otherwise no news is good news!  Follow up 1 month with Dr. Haroldine Laws and echocardiogram.  Take all medication as prescribed the day of your appointment. Bring all medications with you to your appointment.  Do the following things EVERYDAY: 1) Weigh yourself in the morning before breakfast. Write it down and keep it in a log. 2) Take your medicines as prescribed 3) Eat low salt foods-Limit salt (sodium) to 2000 mg per day.  4) Stay as active as you can everyday 5) Limit all fluids for the day to less than 2 liters

## 2017-04-04 MED FILL — CORLANOR 5 MG TABLET: 5 | 30 days supply | Qty: 30 | Fill #1

## 2017-04-09 ENCOUNTER — Telehealth (HOSPITAL_COMMUNITY): Payer: Self-pay | Admitting: *Deleted

## 2017-04-09 NOTE — Telephone Encounter (Signed)
Advanced Heart Failure Triage Encounter  Patient Name: Patricia Collier  Date of Call: 04/09/17  Problem: HR readings  Per Dr. Clayborne Dana request patient has been monitoring her HR for the past week.  During the day it averages 81.  At night when she lays down she palpates it dropping and can drop as low as 34.  She can cough or sit up and it jumps back up to 70's.  In the mornings when she wakes up it averages in the 70's.  Patient feels a small amout of chest pressure when she lays down at night.   Plan:  Will send to Dr. Haroldine Laws to review.   Darron Doom, RN

## 2017-04-14 NOTE — Telephone Encounter (Signed)
Will continue to follow and discuss at f/u. Recent monitor was ok.

## 2017-04-15 ENCOUNTER — Encounter: Payer: Self-pay | Admitting: Family Medicine

## 2017-05-02 DIAGNOSIS — Z01419 Encounter for gynecological examination (general) (routine) without abnormal findings: Secondary | ICD-10-CM | POA: Diagnosis not present

## 2017-05-02 DIAGNOSIS — Z6826 Body mass index (BMI) 26.0-26.9, adult: Secondary | ICD-10-CM | POA: Diagnosis not present

## 2017-05-06 MED FILL — TRANSDERM-SCOP 1.5 MG/3 DAY: 1 | 9 days supply | Qty: 3 | Fill #0

## 2017-05-07 MED FILL — DIGOXIN 0.125 MG TABLET: 125 | 30 days supply | Qty: 30 | Fill #1

## 2017-05-07 MED FILL — CORLANOR 5 MG TABLET: 5 | 30 days supply | Qty: 30 | Fill #2

## 2017-05-09 ENCOUNTER — Encounter (HOSPITAL_COMMUNITY): Payer: Self-pay

## 2017-05-09 ENCOUNTER — Encounter (HOSPITAL_COMMUNITY): Payer: Self-pay | Admitting: Internal Medicine

## 2017-05-09 ENCOUNTER — Ambulatory Visit (HOSPITAL_COMMUNITY)
Admission: RE | Admit: 2017-05-09 | Discharge: 2017-05-09 | Disposition: A | Discharge status: Home / Self Care | Payer: 59 | Source: Ambulatory Visit | Attending: Internal Medicine | Admitting: Internal Medicine

## 2017-05-09 ENCOUNTER — Encounter (HOSPITAL_COMMUNITY): Payer: Self-pay | Admitting: *Deleted

## 2017-05-09 ENCOUNTER — Ambulatory Visit (HOSPITAL_BASED_OUTPATIENT_CLINIC_OR_DEPARTMENT_OTHER)
Admission: RE | Admit: 2017-05-09 | Discharge: 2017-05-09 | Disposition: A | Discharge status: Home / Self Care | Payer: 59 | Source: Ambulatory Visit | Attending: Internal Medicine | Admitting: Internal Medicine

## 2017-05-09 VITALS — BP 121/60 | HR 71 | Wt 151.8 lb

## 2017-05-09 DIAGNOSIS — I340 Nonrheumatic mitral (valve) insufficiency: Secondary | ICD-10-CM | POA: Diagnosis not present

## 2017-05-09 DIAGNOSIS — R911 Solitary pulmonary nodule: Secondary | ICD-10-CM | POA: Diagnosis not present

## 2017-05-09 DIAGNOSIS — I429 Cardiomyopathy, unspecified: Secondary | ICD-10-CM | POA: Insufficient documentation

## 2017-05-09 DIAGNOSIS — I5022 Chronic systolic (congestive) heart failure: Secondary | ICD-10-CM | POA: Insufficient documentation

## 2017-05-09 DIAGNOSIS — R918 Other nonspecific abnormal finding of lung field: Secondary | ICD-10-CM

## 2017-05-09 DIAGNOSIS — M1990 Unspecified osteoarthritis, unspecified site: Secondary | ICD-10-CM | POA: Insufficient documentation

## 2017-05-09 DIAGNOSIS — Z853 Personal history of malignant neoplasm of breast: Secondary | ICD-10-CM | POA: Diagnosis not present

## 2017-05-09 DIAGNOSIS — R002 Palpitations: Secondary | ICD-10-CM | POA: Insufficient documentation

## 2017-05-09 DIAGNOSIS — Z87891 Personal history of nicotine dependence: Secondary | ICD-10-CM | POA: Diagnosis not present

## 2017-05-09 DIAGNOSIS — E785 Hyperlipidemia, unspecified: Secondary | ICD-10-CM | POA: Diagnosis not present

## 2017-05-09 DIAGNOSIS — I471 Supraventricular tachycardia: Secondary | ICD-10-CM | POA: Insufficient documentation

## 2017-05-09 DIAGNOSIS — I517 Cardiomegaly: Secondary | ICD-10-CM | POA: Diagnosis not present

## 2017-05-09 MED ORDER — LOSARTAN POTASSIUM 25 MG PO TABS: 25.0000 mg | ORAL_TABLET | Freq: Every day | ORAL | 3 refills | Status: DC

## 2017-05-09 MED ORDER — SPIRONOLACTONE 25 MG PO TABS: 25.0000 mg | ORAL_TABLET | Freq: Every day | ORAL | 3 refills | Status: DC

## 2017-05-09 MED FILL — SPIRONOLACTONE 25 MG TABLET: 25 | 30 days supply | Qty: 30 | Fill #0

## 2017-05-09 MED FILL — LOSARTAN POTASSIUM 25 MG TA: 25 | 30 days supply | Qty: 30 | Fill #0

## 2017-05-09 NOTE — Patient Instructions (Addendum)
Increase Losartan 25 mg (1 tab) every night  Increase Spironolactone 25 mg (1 tab) every night  Non-Cardiac CT scanning, (CAT scanning), is a noninvasive, special x-ray that produces cross-sectional images of the body using x-rays and a computer. CT scans help physicians diagnose and treat medical conditions. For some CT exams, a contrast material is used to enhance visibility in the area of the body being studied. CT scans provide greater clarity and reveal more details than regular x-ray exams.  Your physician recommends that you schedule a follow-up appointment in: 6 weeks

## 2017-05-09 NOTE — Progress Notes (Signed)
Advanced Heart Failure Clinic Note   Primary Cardiologist: Dr. 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   Admitted 02/04/17-02/07/17 with new onset acute systolic CHF. EF 15-20%. Right and left heart cath done and showed Fick output/index 3.1/1.8. No CAD. She was started on losartan, Spiro and digoxin. Discharge weight was 140 pounds.   Holter monitor 8/18: Multiform PVCs, couplets, and triplets were noted with periods of bigeminy. PVC burden < 1%.No VT observed.   She presents today for regular follow up.   Here for routine visit. Feeling better. More active. SBP now 110-120s. Still with bradycardia at night into 30-40s. Can walk a mile "slowly" (15-18 mins). Has to stop a few times. Has walked up to 3 miles. SOB with inclines. No edema. Orthopnea or PND. Some air hunger on occasional nights. No LE edema, but does get ab bloating. Takes lasix about every 4 days. Weight stable.   Echo 05/09/17 EF ~30% RV ok. Personally reviewed  Echo 02/05/17 LVEF 15-20%, Grade 1 DD, Mild/Mod central MR, Mild LAE  R/LHC 02/06/17  There is severe left ventricular systolic dysfunction.  Dist Cx lesion, 20 %stenosed.  1st Mrg lesion, 60 %stenosed.  Dist RCA lesion, 20 %stenosed.  Mid LAD to Dist LAD lesion, 20 %stenosed.   Findings:  Ao = 80/56 (68) LV = 86/14 RA = 2  RV = 22/2 PA = 24/11 (17) PCW = 6 Fick cardiac output/index = 3.1/1.8 PVR = 3.6 WU Ao sat = 94% PA sat = 58%, 58%  Review of systems complete and found to be negative unless listed in HPI.       Past Medical History:  Diagnosis Date  . Arthritis    ostearthritis -joints, greater left knee.  . Breast cancer,  left breast ([place]) 2009   S/P surgery, radiation, chemotherapy.-no further oncology visits.  . CHF (congestive heart failure) ([place]) dx'd 02/05/2017  . Hyperlipidemia   . Neuromuscular disorder ([place])    neuropathy due to chemotherapy  . Personal history of chemotherapy 2009  . Personal history of radiation therapy 2009  . Seizures ([place])    "childbirth related"  . SVT (supraventricular tachycardia) ([facility])    x1 episode -3 yrs ago- no issues since.All heart testing negative. No further cardiac visits needed.   Current Outpatient Prescriptions  Medication Sig Dispense Refill  . amiodarone (PACERONE) 100 MG tablet Take 1 tablet (100 mg total) by mouth daily. 90 tablet 3  . Cholecalciferol (VITAMIN D-3) 1000 units CAPS Take 2 capsules by mouth daily.     . digoxin (LANOXIN) 0.125 MG tablet Take 1 tablet (0.125 mg total) by mouth daily. 30 tablet 1  . furosemide (LASIX) 20 MG tablet Take 20 mg (1 tab) once on Monday and Friday mornings. 30 tablet 3  . ivabradine (CORLANOR) 5 MG TABS tablet Take 0.5 tablets (2.5 mg total) by mouth 2 (two) times daily with a meal. 30 tablet 3  . losartan (COZAAR) 25 MG tablet Take 0.5 tablets (12.5 mg total) by mouth at bedtime. 30 tablet 0  . spironolactone (ALDACTONE) 25 MG tablet Take 0.5 tablets (12.5 mg total) by mouth daily. 30 tablet 0  . Magnesium 400 MG TABS Take  1 tablet by mouth daily.      No current facility-administered medications for this encounter.    Allergies  Allergen Reactions  . Effexor [Venlafaxine] Other (See Comments)    Caused depression, negative thoughts, and crying   . Hydrocodone-Acetaminophen Itching and Nausea And Vomiting    Patient can tolerate with Zofran    Social History   Social History  . Marital status: Married    Spouse name: N/A  . Number of children: N/A  . Years of education: N/A   Occupational History  . Not on file.   Social History Main Topics  . Smoking status: Former Smoker    Packs/day: 2.00     Years: 10.00    Types: Cigarettes    Quit date: 07/09/1989  . Smokeless tobacco: Never Used  . Alcohol use No  . Drug use: No  . Sexual activity: Yes   Other Topics Concern  . Not on file   Social History Narrative  . No narrative on file   Family History  Problem Relation Age of Onset  . Heart disease Mother   . Hypertension Father   . Breast cancer Sister   . Lung cancer Sister   . Skin cancer Brother    Vitals:   05/09/17 0925  BP: 121/60  Pulse: 71  SpO2: 100%  Weight: 151 lb 12 oz (68.8 kg)   Wt Readings from Last 3 Encounters:  05/09/17 151 lb 12 oz (68.8 kg)  04/02/17 150 lb 6.4 oz (68.2 kg)  03/14/17 149 lb 6.4 oz (67.8 kg)    PHYSICAL EXAM: General: Well appearing. No resp difficulty. HEENT: Normal Neck: Supple. JVP 5-6. Carotids 2+ bilat; no bruits. No thyromegaly or nodule noted. Cor: PMI nondisplaced. RRR, 2/6 HSM at LUSB.  Lungs: CTAB, normal effort. Abdomen: Soft, non-tender, non-distended, no HSM. No bruits or masses. +BS  Extremities: No cyanosis, clubbing, or rash. R and LLE no edema.  Neuro: Alert & orientedx3, cranial nerves grossly intact. moves all 4 extremities w/o difficulty. Affect pleasant   ECG: NSR 75 LAFB Personally reviewed   ASSESSMENT & PLAN:  1. Chronic systolic CHF: NICM, EF 25-36%, felt to be related to doxorubicin therapy. But also with frequent PVC's, holter monitor showed 1% PVC's but patient complained of very frequent PVC's. Echo reviewed personally today EF improved ~30%. LV size also improved.  - Improved NYHA II - Volume status looks good - Continue lasix 20 mg on Monday and Friday.   - Increase Spiro to 25 mg qhs. - Increase losartan to 25 mg qhs - Continue digoxin 0.125 mg daily. Level 0.6 04/02/17.  - Continue corlanor 2.5 mg BID.  - Continue amiodarone 100 mg daily.  - B-blocker prohibited by night timebradycardia.  - Reinforced fluid restriction to < 2 L daily, sodium restriction to less than 2000 mg daily, and  the importance of daily weights. - Check BMET  2. Palpitations - Holter monitor with PVC burden <1%. Per Dr. Haroldine Laws: By her symptoms, suspect this was underestimated.  -Continue amiodarone 100 mg daily.   3. Mitral Regurgitation - Mild on Echo.   4. Pulmonary nodule - repeat CXR  Shirley Friar, PA-C 05/09/17   Patient seen and examined with the above-signed Advanced Practice Provider and/or Housestaff. I personally reviewed laboratory data, imaging studies and relevant notes. I independently examined the patient and formulated the important aspects of the plan. I have edited the note to reflect any of my changes or salient points. I have  personally discussed the plan with the patient and/or family.  Echo reviewed personally. EF getting better. Now ~30%. Functional class also improved NYHA II. Continue lasix as she is doing for volume control. Increase spiro and losartan. Continue amio for now for PVC suppression.   Glori Bickers, MD  10:17 AM

## 2017-05-09 NOTE — Addendum Note (Signed)
Encounter addended by: Shirley Muscat, RN on: 05/09/2017 10:40 AM<BR>    Actions taken: Visit diagnoses modified, Order list changed, Diagnosis association updated

## 2017-05-09 NOTE — Progress Notes (Signed)
  Echocardiogram 2D Echocardiogram has been performed.  Patricia Collier 05/09/2017, 8:50 AM

## 2017-05-09 NOTE — Addendum Note (Signed)
Encounter addended by: Shirley Muscat, RN on: 05/09/2017 10:32 AM<BR>    Actions taken: Order list changed, Diagnosis association updated, Sign clinical note

## 2017-05-21 ENCOUNTER — Ambulatory Visit (HOSPITAL_COMMUNITY)
Admission: RE | Admit: 2017-05-21 | Discharge: 2017-05-21 | Disposition: A | Discharge status: Home / Self Care | Payer: 59 | Source: Ambulatory Visit | Attending: Internal Medicine | Admitting: Internal Medicine

## 2017-05-21 ENCOUNTER — Ambulatory Visit (HOSPITAL_COMMUNITY)
Admission: RE | Admit: 2017-05-21 | Discharge: 2017-05-21 | Disposition: A | Discharge status: Home / Self Care | Payer: 59 | Source: Ambulatory Visit | Attending: Cardiology | Admitting: Cardiology

## 2017-05-21 DIAGNOSIS — R918 Other nonspecific abnormal finding of lung field: Secondary | ICD-10-CM | POA: Insufficient documentation

## 2017-05-21 DIAGNOSIS — I5022 Chronic systolic (congestive) heart failure: Secondary | ICD-10-CM

## 2017-05-21 DIAGNOSIS — R911 Solitary pulmonary nodule: Secondary | ICD-10-CM | POA: Insufficient documentation

## 2017-05-21 DIAGNOSIS — J9811 Atelectasis: Secondary | ICD-10-CM | POA: Diagnosis not present

## 2017-05-21 DIAGNOSIS — J984 Other disorders of lung: Secondary | ICD-10-CM | POA: Diagnosis not present

## 2017-05-21 DIAGNOSIS — I700 Atherosclerosis of aorta: Secondary | ICD-10-CM | POA: Insufficient documentation

## 2017-05-21 DIAGNOSIS — I2510 Atherosclerotic heart disease of native coronary artery without angina pectoris: Secondary | ICD-10-CM | POA: Diagnosis not present

## 2017-05-21 LAB — BASIC METABOLIC PANEL
ANION GAP: 9 (ref 5–15)
BUN: 13 mg/dL (ref 6–20)
CO2: 25 mmol/L (ref 22–32)
Calcium: 9.3 mg/dL (ref 8.9–10.3)
Chloride: 102 mmol/L (ref 101–111)
Creatinine, Ser: 0.83 mg/dL (ref 0.44–1.00)
Glucose, Bld: 91 mg/dL (ref 65–99)
Potassium: 4 mmol/L (ref 3.5–5.1)
SODIUM: 136 mmol/L (ref 135–145)

## 2017-05-29 ENCOUNTER — Telehealth (HOSPITAL_COMMUNITY): Payer: Self-pay | Admitting: *Deleted

## 2017-05-29 NOTE — Telephone Encounter (Signed)
Result Notes for CT Chest Wo Contrast   Notes recorded by Darron Doom, RN on 05/29/2017 at 2:09 PM EST Called and spoke with patient, she is aware and no further questions. ------  Notes recorded by Jolaine Artist, MD on 05/22/2017 at 9:40 PM EST Nodule is stable.

## 2017-06-03 DIAGNOSIS — H25812 Combined forms of age-related cataract, left eye: Secondary | ICD-10-CM | POA: Diagnosis not present

## 2017-06-03 MED FILL — RESTASIS 0.05% EYE EMULSION: 0.05 | 90 days supply | Qty: 180 | Fill #0

## 2017-06-07 MED FILL — TRANSDERM-SCOP 1.5 MG/3 DAY: 1 | 9 days supply | Qty: 3 | Fill #1

## 2017-06-07 MED FILL — CORLANOR 5 MG TABLET: 5 | 30 days supply | Qty: 30 | Fill #3

## 2017-06-07 MED FILL — LOSARTAN POTASSIUM 25 MG TA: 25 | 30 days supply | Qty: 30 | Fill #1

## 2017-06-07 MED FILL — SPIRONOLACTONE 25 MG TABLET: 25 | 30 days supply | Qty: 30 | Fill #1

## 2017-06-10 ENCOUNTER — Other Ambulatory Visit (HOSPITAL_COMMUNITY): Payer: Self-pay | Admitting: *Deleted

## 2017-06-10 ENCOUNTER — Telehealth (HOSPITAL_COMMUNITY): Payer: Self-pay | Admitting: *Deleted

## 2017-06-10 ENCOUNTER — Ambulatory Visit (HOSPITAL_COMMUNITY)
Admission: RE | Admit: 2017-06-10 | Discharge: 2017-06-10 | Disposition: A | Discharge status: Home / Self Care | Payer: 59 | Source: Ambulatory Visit | Attending: Cardiology | Admitting: Cardiology

## 2017-06-10 DIAGNOSIS — I5022 Chronic systolic (congestive) heart failure: Secondary | ICD-10-CM | POA: Insufficient documentation

## 2017-06-10 LAB — BASIC METABOLIC PANEL
Anion gap: 9 (ref 5–15)
BUN: 15 mg/dL (ref 6–20)
CALCIUM: 9.5 mg/dL (ref 8.9–10.3)
CHLORIDE: 106 mmol/L (ref 101–111)
CO2: 24 mmol/L (ref 22–32)
Creatinine, Ser: 0.81 mg/dL (ref 0.44–1.00)
GFR calc Af Amer: >60 mL/min (ref >60)
Glucose, Bld: 105 mg/dL — ABNORMAL HIGH (ref 65–99)
POTASSIUM: 4 mmol/L (ref 3.5–5.1)
SODIUM: 139 mmol/L (ref 135–145)

## 2017-06-10 MED ORDER — SPIRONOLACTONE 25 MG PO TABS: 12.5000 mg | ORAL_TABLET | Freq: Every day | ORAL | 3 refills | Status: DC

## 2017-06-10 MED FILL — AMIODARONE HCL 200 MG TAB: 200 | 76 days supply | Qty: 90 | Fill #1

## 2017-06-10 MED FILL — FUROSEMIDE 20 MG TABS: 20 | 84 days supply | Qty: 24 | Fill #1

## 2017-06-10 NOTE — Telephone Encounter (Signed)
-----   Message from Conrad [place], NP sent at 06/10/2017 11:32 AM EST ----- Regarding: RE: medication changes Please call and ask her to come in for BMET today.  Cut spiro back to 12.5 mg daily.   Thanks Amy  ----- Message ----- From: Candy Sledge, RN Sent: 06/10/2017  10:02 AM To: Darron Doom, RN, Amy D Ninfa Meeker, NP Subject: medication changes                             Patient called with low back pain, diarrhea, and very fatigued. Increased Losartan to 25 mg and SPiro to 25 mg. Has felt this way since change made on 11/10. Any change??

## 2017-06-10 NOTE — Telephone Encounter (Signed)
Scheduled patient for labs 1200 today. Informed of medication change.  Balinda Quails RN, VAD Coordinator 24/7 pager [phone]

## 2017-06-14 ENCOUNTER — Other Ambulatory Visit (HOSPITAL_COMMUNITY): Payer: Self-pay | Admitting: Cardiology

## 2017-06-14 MED ORDER — DIGOXIN 125 MCG PO TABS: 0.1250 mg | ORAL_TABLET | Freq: Every day | ORAL | 3 refills | Status: DC

## 2017-06-14 MED FILL — DIGOXIN 0.125 MG TABLET: 125 | 30 days supply | Qty: 30 | Fill #0

## 2017-07-03 ENCOUNTER — Encounter (HOSPITAL_COMMUNITY): Payer: Self-pay | Admitting: Internal Medicine

## 2017-07-03 ENCOUNTER — Ambulatory Visit (HOSPITAL_COMMUNITY)
Admission: RE | Admit: 2017-07-03 | Discharge: 2017-07-03 | Disposition: A | Discharge status: Home / Self Care | Payer: 59 | Source: Ambulatory Visit | Attending: Internal Medicine | Admitting: Internal Medicine

## 2017-07-03 VITALS — BP 108/80 | HR 73 | Wt 146.0 lb

## 2017-07-03 DIAGNOSIS — E785 Hyperlipidemia, unspecified: Secondary | ICD-10-CM | POA: Diagnosis not present

## 2017-07-03 DIAGNOSIS — R002 Palpitations: Secondary | ICD-10-CM | POA: Diagnosis not present

## 2017-07-03 DIAGNOSIS — I471 Supraventricular tachycardia: Secondary | ICD-10-CM | POA: Insufficient documentation

## 2017-07-03 DIAGNOSIS — I340 Nonrheumatic mitral (valve) insufficiency: Secondary | ICD-10-CM | POA: Diagnosis not present

## 2017-07-03 DIAGNOSIS — I428 Other cardiomyopathies: Secondary | ICD-10-CM

## 2017-07-03 DIAGNOSIS — I5022 Chronic systolic (congestive) heart failure: Secondary | ICD-10-CM | POA: Diagnosis not present

## 2017-07-03 DIAGNOSIS — Z853 Personal history of malignant neoplasm of breast: Secondary | ICD-10-CM | POA: Diagnosis not present

## 2017-07-03 DIAGNOSIS — R911 Solitary pulmonary nodule: Secondary | ICD-10-CM | POA: Insufficient documentation

## 2017-07-03 DIAGNOSIS — I493 Ventricular premature depolarization: Secondary | ICD-10-CM | POA: Diagnosis not present

## 2017-07-03 DIAGNOSIS — R001 Bradycardia, unspecified: Secondary | ICD-10-CM | POA: Insufficient documentation

## 2017-07-03 DIAGNOSIS — Z79899 Other long term (current) drug therapy: Secondary | ICD-10-CM | POA: Diagnosis not present

## 2017-07-03 DIAGNOSIS — R0602 Shortness of breath: Secondary | ICD-10-CM | POA: Diagnosis not present

## 2017-07-03 DIAGNOSIS — Z87891 Personal history of nicotine dependence: Secondary | ICD-10-CM | POA: Diagnosis not present

## 2017-07-03 DIAGNOSIS — I429 Cardiomyopathy, unspecified: Secondary | ICD-10-CM | POA: Diagnosis not present

## 2017-07-03 MED ORDER — LOSARTAN POTASSIUM 25 MG PO TABS: 25.0000 mg | ORAL_TABLET | Freq: Two times a day (BID) | ORAL | 3 refills | Status: DC

## 2017-07-03 MED ORDER — IVABRADINE HCL 5 MG PO TABS: 2.5000 mg | ORAL_TABLET | Freq: Two times a day (BID) | ORAL | 3 refills | Status: DC

## 2017-07-03 MED FILL — LOSARTAN POTASSIUM 25 MG TA: 25 | 60 days supply | Qty: 60 | Fill #2

## 2017-07-03 MED FILL — CORLANOR 5 MG TABLET: 5 | 30 days supply | Qty: 30 | Fill #0

## 2017-07-03 MED FILL — SPIRONOLACTONE 25 MG TABLET: 25 | 60 days supply | Qty: 60 | Fill #2

## 2017-07-03 NOTE — Progress Notes (Signed)
Advanced Heart Failure Clinic Note   Primary Cardiologist: Dr. 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   Admitted 02/04/17-02/07/17 with new onset acute systolic CHF. EF 15-20%. Right and left heart cath done and showed Fick output/index 3.1/1.8. No CAD. She was started on losartan, Spiro and digoxin. Discharge weight was 140 pounds.   Holter monitor 8/18: Multiform PVCs, couplets, and triplets were noted with periods of bigeminy. PVC burden < 1%.No VT observed.   She presents today for regular follow up.   05/09/2017: Here for routine visit. Feeling better. More active. SBP now 110-120s. Still with bradycardia at night into 30-40s. Can walk a mile "slowly" (15-18 mins). Has to stop a few times. Has walked up to 3 miles. SOB with inclines. No edema. Orthopnea or PND. Some air hunger on occasional nights. No LE edema, but does get ab bloating. Takes lasix about every 4 days. Weight stable.   Here for routine visit. Overall feels much better now though she says she had abdominal pain, nausea, fatigue, SOB and diarrhea 2 days after increasing spiro after returning from [place] in November. Kept taking for two weeks, but felt awful. Called and had labs, which where unchanged. She stopped the increased dose of spiro and felt better after 2 days. She takes her lasix PRN only when she has increased abdominal swelling. Has only needed 1 dose in last 1.5 weeks. Some fatigue. Feels tachycardic and then feels SOB with exertion (walking a long distance, stairs, carrying heavy items). Can do all grocery shopping without stopping. Trying to lose weight. Does not sleep well because of  nocturia. Less PND. Sleeps with 1 pillow, +orthopnea with flat. Denies ectopy except when she is tachycardic. Retiring next month. Weight 146 at home.  No edema, lungs clear, murmur.  Echo 05/09/17 EF ~30% RV ok. Personally reviewed  Echo 02/05/17 LVEF 15-20%, Grade 1 DD, Mild/Mod central MR, Mild LAE  R/LHC 02/06/17  There is severe left ventricular systolic dysfunction.  Dist Cx lesion, 20 %stenosed.  1st Mrg lesion, 60 %stenosed.  Dist RCA lesion, 20 %stenosed.  Mid LAD to Dist LAD lesion, 20 %stenosed.   Findings:  Ao = 80/56 (68) LV = 86/14 RA = 2  RV = 22/2 PA = 24/11 (17) PCW = 6 Fick cardiac output/index = 3.1/1.8 PVR = 3.6 WU Ao sat = 94% PA sat = 58%, 58%  Review of systems complete and found to be negative unless listed in HPI.       Past Medical History:  Diagnosis Date  . Arthritis    ostearthritis -joints, greater left knee.  . Breast cancer, left breast ([place]) 2009   S/P surgery, radiation, chemotherapy.-no further oncology visits.  . CHF (congestive heart failure) ([place]) dx'd 02/05/2017  . Hyperlipidemia   . Neuromuscular disorder ([place])    neuropathy due to chemotherapy  . Personal history of chemotherapy 2009  . Personal history of radiation therapy 2009  . Seizures ([place])    "childbirth related"  . SVT (supraventricular tachycardia) ([facility])    x1 episode -3 yrs ago- no issues since.All heart testing negative. No further cardiac visits needed.   Current Outpatient Medications  Medication Sig  Dispense Refill  . amiodarone (PACERONE) 100 MG tablet Take 1 tablet (100 mg total) by mouth daily. 90 tablet 3  . Cholecalciferol (VITAMIN D-3) 1000 units CAPS Take 2 capsules by mouth daily.     . digoxin (LANOXIN) 0.125 MG tablet Take 1 tablet (0.125 mg total) by mouth daily. 30 tablet 3  . furosemide (LASIX) 20 MG tablet Take 20 mg by mouth daily as needed.    . ivabradine (CORLANOR) 5 MG TABS tablet Take 0.5 tablets (2.5 mg total) by mouth 2 (two) times  daily with a meal. 30 tablet 3  . losartan (COZAAR) 25 MG tablet Take 1 tablet (25 mg total) by mouth at bedtime. 30 tablet 3  . Magnesium 400 MG TABS Take 1 tablet by mouth daily.     Marland Kitchen spironolactone (ALDACTONE) 25 MG tablet Take 0.5 tablets (12.5 mg total) by mouth daily. 30 tablet 3   No current facility-administered medications for this encounter.    Allergies  Allergen Reactions  . Effexor [Venlafaxine] Other (See Comments)    Caused depression, negative thoughts, and crying   . Hydrocodone-Acetaminophen Itching and Nausea And Vomiting    Patient can tolerate with Zofran    Social History   Socioeconomic History  . Marital status: Married    Spouse name: Not on file  . Number of children: Not on file  . Years of education: Not on file  . Highest education level: Not on file  Social Needs  . Financial resource strain: Not on file  . Food insecurity - worry: Not on file  . Food insecurity - inability: Not on file  . Transportation needs - medical: Not on file  . Transportation needs - non-medical: Not on file  Occupational History  . Not on file  Tobacco Use  . Smoking status: Former Smoker    Packs/day: 2.00    Years: 10.00    Pack years: 20.00    Types: Cigarettes    Last attempt to quit: 07/09/1989    Years since quitting: 28.0  . Smokeless tobacco: Never Used  Substance and Sexual Activity  . Alcohol use: No  . Drug use: No  . Sexual activity: Yes  Other Topics Concern  . Not on file  Social History Narrative  . Not on file   Family History  Problem Relation Age of Onset  . Heart disease Mother   . Hypertension Father   . Breast cancer Sister   . Lung cancer Sister   . Skin cancer Brother    Vitals:   07/03/17 1053  BP: 108/80  Pulse: 73  SpO2: 96%  Weight: 146 lb (66.2 kg)   Wt Readings from Last 3 Encounters:  07/03/17 146 lb (66.2 kg)  05/09/17 151 lb 12 oz (68.8 kg)  04/02/17 150 lb 6.4 oz (68.2 kg)    PHYSICAL EXAM: General: Well  appearing. No resp difficulty. HEENT: Normal Neck: Supple. JVP flat. Carotids 2+ bilat; no bruits. No thyromegaly or nodule noted. Cor: PMI nondisplaced. RRR, + murmur at RUSB Lungs: CTAB, normal effort. Abdomen: Soft, non-tender, non-distended, no HSM. No bruits or masses. +BS  Extremities: No cyanosis, clubbing, or rash. R and LLE no edema.  Neuro: Alert & orientedx3, cranial nerves grossly intact. moves all 4 extremities w/o difficulty. Affect pleasant  ECG: NSR 75 LAFB Personally reviewed   ASSESSMENT & PLAN:  1. Chronic systolic CHF: NICM, EF 81-19%, felt to be related to doxorubicin therapy. But also with frequent PVC's, holter monitor showed  1% PVC's but patient complained of very frequent PVC's. Echo reviewed personally today EF improved ~30%. LV size also improved.  - Echo at bedside today EF 25%  - Stable NYHA II - Volume status looks good - Only takes lasix PRN. - Taking Spiro 12.5 mg daily. Did not tolerate higher dose. - Increase losartan to 25 mg bid - Continue digoxin 0.125 mg daily. Level 0.6 04/02/17.  - Continue corlanor 2.5 mg BID.  - Continue amiodarone 100 mg daily.  - B-blocker prohibited by night time bradycardia.  - Reinforced fluid restriction to < 2 L daily, sodium restriction to less than 2000 mg daily, and the importance of daily weights. - Check BMET - Will do cMRI  2. Palpitations - Holter monitor with PVC burden <1%. Per Dr. Haroldine Laws: By her symptoms, suspect this was underestimated.  -Continue amiodarone 100 mg daily.  - Improved. Now only feels when she is exerting herself.  3. Mitral Regurgitation - Mild on Echo.   4. Pulmonary nodule - repeat CXR  Georgiana Shore, NP 07/03/17   Patient seen and examined with the above-signed Advanced Practice Provider and/or Housestaff. I personally reviewed laboratory data, imaging studies and relevant notes. I independently examined the patient and formulated the important aspects of the plan. I have  edited the note to reflect any of my changes or salient points. I have personally discussed the plan with the patient and/or family.  Overall she is improving from a functional standpoint but bedside echo done today personally and EF still in 25% range. No real improvement with PVC suppression. I continue to worry about adriamycin toxicity. Will get cMRI to help look for other etiologies. If EF <= 35% will likely need ICS. Volume status ok. Did not tolerate titration of spiro. Will increase losartan to 25 bid. Ideally would like to consider Entresto but not sure she can handle the diuresis given response to increasing spiro. Will try to switch in the near future.   Glori Bickers, MD  5:21 PM

## 2017-07-03 NOTE — Patient Instructions (Signed)
Increase Losartan to 25 mg Twice daily   Your physician has requested that you have a cardiac MRI. Cardiac MRI uses a computer to create images of your heart as its beating, producing both still and moving pictures of your heart and major blood vessels. For further information please visit [email or web address] Please follow the instruction sheet given to you today for more information.  Your physician recommends that you schedule a follow-up appointment in: 2 months

## 2017-07-08 ENCOUNTER — Ambulatory Visit (HOSPITAL_COMMUNITY)
Admission: RE | Admit: 2017-07-08 | Discharge: 2017-07-08 | Disposition: A | Discharge status: Home / Self Care | Payer: 59 | Source: Ambulatory Visit | Attending: Internal Medicine | Admitting: Internal Medicine

## 2017-07-08 DIAGNOSIS — I428 Other cardiomyopathies: Secondary | ICD-10-CM | POA: Diagnosis not present

## 2017-07-08 DIAGNOSIS — R9439 Abnormal result of other cardiovascular function study: Secondary | ICD-10-CM | POA: Insufficient documentation

## 2017-07-08 DIAGNOSIS — I5189 Other ill-defined heart diseases: Secondary | ICD-10-CM | POA: Diagnosis not present

## 2017-07-08 MED ORDER — GADOBENATE DIMEGLUMINE 529 MG/ML IV SOLN
25.0000 mL | Freq: Once | INTRAVENOUS | Status: AC
  Administered 2017-07-08: 22 mL via INTRAVENOUS

## 2017-07-15 MED FILL — DIGOXIN 0.125 MG TABLET: 125 | 30 days supply | Qty: 30 | Fill #1

## 2017-08-01 MED FILL — CORLANOR 5 MG TABLET: 5 | 30 days supply | Qty: 30 | Fill #1

## 2017-08-19 ENCOUNTER — Other Ambulatory Visit (HOSPITAL_COMMUNITY): Payer: Self-pay | Admitting: Internal Medicine

## 2017-08-19 MED FILL — DIGOXIN 0.125 MG TABLET: 125 | 30 days supply | Qty: 30 | Fill #2

## 2017-08-20 MED FILL — LOSARTAN POTASSIUM 25 MG TA: 25 | 30 days supply | Qty: 60 | Fill #0

## 2017-09-12 ENCOUNTER — Encounter (HOSPITAL_COMMUNITY): Payer: Self-pay | Admitting: Internal Medicine

## 2017-09-12 ENCOUNTER — Ambulatory Visit (HOSPITAL_COMMUNITY)
Admission: RE | Admit: 2017-09-12 | Discharge: 2017-09-12 | Disposition: A | Discharge status: Home / Self Care | Payer: PPO | Source: Ambulatory Visit | Attending: Internal Medicine | Admitting: Internal Medicine

## 2017-09-12 ENCOUNTER — Other Ambulatory Visit: Payer: Self-pay

## 2017-09-12 VITALS — Wt 145.0 lb

## 2017-09-12 DIAGNOSIS — M1990 Unspecified osteoarthritis, unspecified site: Secondary | ICD-10-CM | POA: Insufficient documentation

## 2017-09-12 DIAGNOSIS — I5022 Chronic systolic (congestive) heart failure: Secondary | ICD-10-CM | POA: Insufficient documentation

## 2017-09-12 DIAGNOSIS — Z808 Family history of malignant neoplasm of other organs or systems: Secondary | ICD-10-CM | POA: Insufficient documentation

## 2017-09-12 DIAGNOSIS — Z923 Personal history of irradiation: Secondary | ICD-10-CM | POA: Insufficient documentation

## 2017-09-12 DIAGNOSIS — Z885 Allergy status to narcotic agent status: Secondary | ICD-10-CM | POA: Insufficient documentation

## 2017-09-12 DIAGNOSIS — I471 Supraventricular tachycardia: Secondary | ICD-10-CM | POA: Diagnosis not present

## 2017-09-12 DIAGNOSIS — I340 Nonrheumatic mitral (valve) insufficiency: Secondary | ICD-10-CM | POA: Insufficient documentation

## 2017-09-12 DIAGNOSIS — R079 Chest pain, unspecified: Secondary | ICD-10-CM

## 2017-09-12 DIAGNOSIS — I429 Cardiomyopathy, unspecified: Secondary | ICD-10-CM | POA: Diagnosis not present

## 2017-09-12 DIAGNOSIS — Z803 Family history of malignant neoplasm of breast: Secondary | ICD-10-CM | POA: Diagnosis not present

## 2017-09-12 DIAGNOSIS — E785 Hyperlipidemia, unspecified: Secondary | ICD-10-CM | POA: Diagnosis not present

## 2017-09-12 DIAGNOSIS — R911 Solitary pulmonary nodule: Secondary | ICD-10-CM | POA: Insufficient documentation

## 2017-09-12 DIAGNOSIS — Z9221 Personal history of antineoplastic chemotherapy: Secondary | ICD-10-CM | POA: Insufficient documentation

## 2017-09-12 DIAGNOSIS — R0789 Other chest pain: Secondary | ICD-10-CM | POA: Diagnosis not present

## 2017-09-12 DIAGNOSIS — Z79899 Other long term (current) drug therapy: Secondary | ICD-10-CM | POA: Diagnosis not present

## 2017-09-12 DIAGNOSIS — Z8249 Family history of ischemic heart disease and other diseases of the circulatory system: Secondary | ICD-10-CM | POA: Diagnosis not present

## 2017-09-12 DIAGNOSIS — R002 Palpitations: Secondary | ICD-10-CM | POA: Diagnosis not present

## 2017-09-12 DIAGNOSIS — Z9889 Other specified postprocedural states: Secondary | ICD-10-CM | POA: Insufficient documentation

## 2017-09-12 DIAGNOSIS — I493 Ventricular premature depolarization: Secondary | ICD-10-CM | POA: Insufficient documentation

## 2017-09-12 DIAGNOSIS — Z801 Family history of malignant neoplasm of trachea, bronchus and lung: Secondary | ICD-10-CM | POA: Diagnosis not present

## 2017-09-12 DIAGNOSIS — Z87891 Personal history of nicotine dependence: Secondary | ICD-10-CM | POA: Insufficient documentation

## 2017-09-12 DIAGNOSIS — Z853 Personal history of malignant neoplasm of breast: Secondary | ICD-10-CM | POA: Diagnosis not present

## 2017-09-12 DIAGNOSIS — Z888 Allergy status to other drugs, medicaments and biological substances status: Secondary | ICD-10-CM | POA: Diagnosis not present

## 2017-09-12 LAB — BASIC METABOLIC PANEL
ANION GAP: 11 (ref 5–15)
BUN: 12 mg/dL (ref 6–20)
CO2: 20 mmol/L — AB (ref 22–32)
Calcium: 9.4 mg/dL (ref 8.9–10.3)
Chloride: 105 mmol/L (ref 101–111)
Creatinine, Ser: 0.68 mg/dL (ref 0.44–1.00)
GFR calc Af Amer: >60 mL/min (ref >60)
GFR calc non Af Amer: >60 mL/min (ref >60)
GLUCOSE: 99 mg/dL (ref 65–99)
Potassium: 4.2 mmol/L (ref 3.5–5.1)
Sodium: 136 mmol/L (ref 135–145)

## 2017-09-12 MED ORDER — IVABRADINE HCL 5 MG PO TABS: 5.0000 mg | ORAL_TABLET | Freq: Two times a day (BID) | ORAL | 3 refills | Status: DC

## 2017-09-12 NOTE — Progress Notes (Signed)
Advanced Heart Failure Clinic Note   Primary Cardiologist: Dr. 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   Admitted 02/04/17-02/07/17 with new onset acute systolic CHF. EF 15-20%. Right and left heart cath done and showed Fick output/index 3.1/1.8. No CAD. She was started on losartan, Spiro and digoxin. Discharge weight was 140 pounds.   Holter monitor 8/18: Multiform PVCs, couplets, and triplets were noted with periods of bigeminy. PVC burden < 1%.No VT observed.   cMRI 12/18 EF 35%. Minimal LGE  She presents today for HF follow up. Multiple complaints. Has been checking her BP sitting to standing with BP drop of 10-30 mmHg (as low as 70s). Resting HR 75 at rest and then 90 when on her feet. HR up to 100 with walking. SOB is worse - having SOB at rest. Also having CP on left side 6/10 that radiates down left arm and back that lasts minutes to hours. These episodes occur when at rest and go away spontaneously. +orthopnea and new dry cough. No edema.  Taking PRN lasix 20-40mg  once every 7-10 days for weight gain/abdominal bloating. Weights ~145 lbs at home. Compliant with medications. Limits salt and fluid intake.   Echo 05/09/17 EF ~30% RV ok. Personally reviewed  Echo 02/05/17 LVEF 15-20%, Grade 1 DD, Mild/Mod central MR, Mild LAE  R/LHC 02/06/17  There is severe left ventricular systolic dysfunction.  Dist Cx lesion, 20 %stenosed.  1st Mrg lesion, 60 %stenosed.  Dist RCA lesion, 20 %stenosed.  Mid LAD to Dist LAD lesion, 20 %stenosed.   Findings:  Ao = 80/56 (68) LV = 86/14 RA = 2  RV = 22/2 PA = 24/11 (17) PCW = 6 Fick cardiac output/index =  3.1/1.8 PVR = 3.6 WU Ao sat = 94% PA sat = 58%, 58%  Cardiac MRI 06/2017 1.  Moderate LVE with diffuse hypokinesis EF 35% 2. Small area of apical hyperenhancement on delayed gadolinium images 3.  Mild MR 4.  Mild LAE 5. Thickened tri leaflet AV with restricted leaflet motion suggest echo for AS correlation 6.  Normal RV size and function 7.  Normal aortic root 3.0 cm  Review of systems complete and found to be negative unless listed in HPI.       Past Medical History:  Diagnosis Date  . Arthritis    ostearthritis -joints, greater left knee.  . Breast cancer, left breast ([place]) 2009   S/P surgery, radiation, chemotherapy.-no further oncology visits.  . CHF (congestive heart failure) ([place]) dx'd 02/05/2017  . Hyperlipidemia   . Neuromuscular disorder ([place])    neuropathy due to chemotherapy  . Personal history of chemotherapy 2009  . Personal history of radiation therapy 2009  . Seizures ([place])    "childbirth related"  . SVT (supraventricular tachycardia) (H[facility])    x1 episode -3 yrs ago- no issues since.All heart testing negative. No further cardiac visits needed.   Current Outpatient Medications  Medication Sig Dispense Refill  . amiodarone (PACERONE) 100 MG tablet Take 1 tablet (100 mg total) by mouth daily. 90 tablet 3  . Cholecalciferol (VITAMIN D-3) 1000 units CAPS Take 2 capsules by mouth daily.     . digoxin (LANOXIN) 0.125  MG tablet Take 1 tablet (0.125 mg total) by mouth daily. 30 tablet 3  . furosemide (LASIX) 20 MG tablet Take 20 mg by mouth daily as needed.    . ivabradine (CORLANOR) 5 MG TABS tablet Take 0.5 tablets (2.5 mg total) by mouth 2 (two) times daily with a meal. 90 tablet 3  . losartan (COZAAR) 25 MG tablet Take 1 tablet (25 mg total) by mouth 2 (two) times daily. 60 tablet 3  . Magnesium 400 MG TABS Take 1 tablet by mouth daily.     Marland Kitchen spironolactone (ALDACTONE) 25 MG tablet Take 0.5 tablets (12.5 mg total) by mouth daily. 30 tablet 3   No current  facility-administered medications for this encounter.    Allergies  Allergen Reactions  . Effexor [Venlafaxine] Other (See Comments)    Caused depression, negative thoughts, and crying   . Hydrocodone-Acetaminophen Itching and Nausea And Vomiting    Patient can tolerate with Zofran    Social History   Socioeconomic History  . Marital status: Married    Spouse name: Not on file  . Number of children: Not on file  . Years of education: Not on file  . Highest education level: Not on file  Social Needs  . Financial resource strain: Not on file  . Food insecurity - worry: Not on file  . Food insecurity - inability: Not on file  . Transportation needs - medical: Not on file  . Transportation needs - non-medical: Not on file  Occupational History  . Not on file  Tobacco Use  . Smoking status: Former Smoker    Packs/day: 2.00    Years: 10.00    Pack years: 20.00    Types: Cigarettes    Last attempt to quit: 07/09/1989    Years since quitting: 28.1  . Smokeless tobacco: Never Used  Substance and Sexual Activity  . Alcohol use: No  . Drug use: No  . Sexual activity: Yes  Other Topics Concern  . Not on file  Social History Narrative  . Not on file   Family History  Problem Relation Age of Onset  . Heart disease Mother   . Hypertension Father   . Breast cancer Sister   . Lung cancer Sister   . Skin cancer Brother    Vitals:   09/12/17 1346 09/12/17 1348  SpO2: 100% 100%  Weight: 145 lb (65.8 kg)      Wt Readings from Last 3 Encounters:  09/12/17 145 lb (65.8 kg)  07/03/17 146 lb (66.2 kg)  05/09/17 151 lb 12 oz (68.8 kg)    BP 115/69 HR 88  PHYSICAL EXAM: General: Well appearing. No resp difficulty. HEENT: Normal. anicteric Neck: Supple. JVP flat. Carotids 2+ bilat; no bruits. No thyromegaly or nodule noted. Cor: PMI nondisplaced. RRR, 2/6 TR Lungs: CTAB, normal effort. No wheeze Abdomen: Soft, non-tender, non-distended, no HSM. No bruits or masses. +BS   Extremities: No cyanosis, clubbing, or rash. R and LLE no edema. Warm Neuro: Alert & orientedx3, cranial nerves grossly intact. moves all 4 extremities w/o difficulty. Affect pleasant but anxious  ASSESSMENT & PLAN:  1. Chronic systolic CHF: NICM, EF 65-99%, felt to be related to doxorubicin therapy. But also with frequent PVC's, holter monitor showed 1% PVC's but patient complained of very frequent PVC's. Echo reviewed personally today EF improved ~30%. LV size also improved. Cardiac MRI 12/18 EF 35% minimal LGE   - NYHA II-III - Volume status dry - Only takes lasix PRN. - Continue  Spiro 12.5 mg daily. Did not tolerate higher dose. - Continue losartan 25 mg bid - Continue digoxin 0.125 mg daily. Level 0.6 04/02/17.  - Increase corlanor to 5 mg BID.  - Continue amiodarone 100 mg daily.  - B-blocker prohibited by orthostasis. - Reinforced fluid restriction to < 2 L daily, sodium restriction to less than 2000 mg daily, and the importance of daily weights.  2. Palpitations - Holter monitor with PVC burden <1%. Per Dr. Haroldine Laws: By her symptoms, suspect this was underestimated.  - Continue amiodarone 100 mg daily.   3. Mitral Regurgitation - Mild on Echo. No change.   4. Pulmonary nodule - stable on f/u CT 11/18  5. Chest Pain - suspect costochrondirits - but will check CT chest to exclude PE and recurrent breast CA - check ECG today   EKG today CT chest with contrast Increase corlanor to 5 mg BID  Georgiana Shore, NP 09/12/17   Patient seen and examined with the above-signed Advanced Practice Provider and/or Housestaff. I personally reviewed laboratory data, imaging studies and relevant notes. I independently examined the patient and formulated the important aspects of the plan. I have edited the note to reflect any of my changes or salient points. I have personally discussed the plan with the patient and/or family.   Overall her HF seems to be getting better slowly but  still NYHA II-III symptoms.  cmRI reviewed with her EF 35% minimal LGE. PVCs appears suppressed. Volume status looks a bit low and suspect that is what is causing her orthostasis. Instructed her to be careful with prn diuretic dosing. She is having Chest wall pain. Suspect costochrondritis but given previous breast CA will get CT to exclude PE or recurrent disease. Will increase ivabradine.   Glori Bickers, MD  10:06 PM

## 2017-09-12 NOTE — Patient Instructions (Signed)
Increase Corlanor to 5 mg Twice daily   CT of Chest  Your physician recommends that you schedule a follow-up appointment in: 3 months

## 2017-09-13 ENCOUNTER — Telehealth (HOSPITAL_COMMUNITY): Payer: Self-pay | Admitting: Pharmacist

## 2017-09-13 NOTE — Telephone Encounter (Signed)
Corlanor 5 mg BID PA approved by [employer] through 07/08/18.   Ruta Hinds. Velva Harman, PharmD, BCPS, CPP Clinical Pharmacist Phone: [phone] 09/13/2017 11:45 AM

## 2017-09-16 MED FILL — DIGOXIN 0.125 MG TABLET: 125 | 30 days supply | Qty: 30 | Fill #3

## 2017-09-16 MED FILL — CORLANOR 5 MG TABLET: 5 | 90 days supply | Qty: 180 | Fill #0

## 2017-09-17 ENCOUNTER — Ambulatory Visit (HOSPITAL_COMMUNITY)
Admission: RE | Admit: 2017-09-17 | Discharge: 2017-09-17 | Disposition: A | Discharge status: Home / Self Care | Payer: PPO | Source: Ambulatory Visit | Attending: Internal Medicine | Admitting: Internal Medicine

## 2017-09-17 DIAGNOSIS — K449 Diaphragmatic hernia without obstruction or gangrene: Secondary | ICD-10-CM | POA: Diagnosis not present

## 2017-09-17 DIAGNOSIS — R079 Chest pain, unspecified: Secondary | ICD-10-CM | POA: Diagnosis not present

## 2017-09-17 MED ORDER — IOPAMIDOL (ISOVUE-300) INJECTION 61%
INTRAVENOUS | Status: AC
  Administered 2017-09-17: 75 mL
  Filled 2017-09-17: qty 75

## 2017-09-30 ENCOUNTER — Encounter: Payer: Self-pay | Admitting: Family Medicine

## 2017-09-30 ENCOUNTER — Other Ambulatory Visit: Payer: Self-pay

## 2017-09-30 ENCOUNTER — Ambulatory Visit (INDEPENDENT_AMBULATORY_CARE_PROVIDER_SITE_OTHER): Payer: PPO | Admitting: Family Medicine

## 2017-09-30 ENCOUNTER — Ambulatory Visit (INDEPENDENT_AMBULATORY_CARE_PROVIDER_SITE_OTHER): Payer: PPO

## 2017-09-30 VITALS — BP 116/76 | HR 88 | Temp 98.8°F | Resp 16 | Ht 64.0 in | Wt 145.0 lb

## 2017-09-30 DIAGNOSIS — R0602 Shortness of breath: Secondary | ICD-10-CM | POA: Diagnosis not present

## 2017-09-30 DIAGNOSIS — J209 Acute bronchitis, unspecified: Secondary | ICD-10-CM | POA: Diagnosis not present

## 2017-09-30 DIAGNOSIS — R05 Cough: Secondary | ICD-10-CM | POA: Diagnosis not present

## 2017-09-30 MED ORDER — PROMETHAZINE-DM 6.25-15 MG/5ML PO SYRP: 5.0000 mL | ORAL_SOLUTION | Freq: Four times a day (QID) | ORAL | 0 refills | Status: DC | PRN

## 2017-09-30 MED FILL — PROMETHAZINE-DM SYRUP: 6.25-15 | 12 days supply | Qty: 240 | Fill #0

## 2017-09-30 NOTE — Progress Notes (Signed)
   Subjective:    Patient ID: Patricia Collier, female    DOB: 05-08-1946, 72 y.o.   MRN: [id number]  HPI URI- 'it could be the flu, it could be bronchitis, it could be a sinus infxn, a really bad cold or PNA'.  + congestion, body aches, cough- mostly dry.  Nasal drainage is thick and yellow.  + SOB.  Low grade subjective temps.  + sick contacts.  + HA.  Denies sinus pain/pressure.  sxs started ~1 week ago.  Pt took left over Amoxicillin 500mg  BID x5 days (she got this from [place])   Review of Systems For ROS see HPI     Objective:   Physical Exam  Constitutional: She appears well-developed and well-nourished. No distress.  HENT:  Head: Normocephalic and atraumatic.  TMs normal bilaterally Mild nasal congestion Throat w/out erythema, edema, or exudate  Eyes: Pupils are equal, round, and reactive to light. Conjunctivae and EOM are normal.  Neck: Normal range of motion. Neck supple.  Cardiovascular: Normal rate, regular rhythm, normal heart sounds and intact distal pulses.  No murmur heard. Pulmonary/Chest: Effort normal and breath sounds normal. No respiratory distress. She has no wheezes.  + hacking cough  Lymphadenopathy:    She has no cervical adenopathy.  Vitals reviewed.         Assessment & Plan:  Bronchitis- no wheezing or crackles on PE but pt does have hacking cough.  Given her CHF, will get CXR to assess.  I do not hear anything suggestive of bacterial infxn so will hold on abx at this time.  No wheezing so will hold albuterol as this could increase her HR and exacerbate her CHF.  No Prednisone at this time for the same reason.  Start cough meds prn.  Reviewed supportive care and red flags that should prompt return.  Pt expressed understanding and is in agreement w/ plan.

## 2017-09-30 NOTE — Patient Instructions (Addendum)
Follow up as scheduled or as needed Please go to our [place] office at [place] (near [employer]) to get your chest xray Use the cough syrup as needed REST! Call with any questions or concerns Hang in there!!!

## 2017-10-25 ENCOUNTER — Other Ambulatory Visit (HOSPITAL_COMMUNITY): Payer: Self-pay | Admitting: Internal Medicine

## 2017-10-25 MED FILL — FUROSEMIDE 20 MG TABS: 20 | 84 days supply | Qty: 24 | Fill #2

## 2017-10-25 MED FILL — LOSARTAN POTASSIUM 25 MG TA: 25 | 30 days supply | Qty: 60 | Fill #1

## 2017-10-29 MED FILL — DIGOXIN 0.125 MG TABLET: 125 | 30 days supply | Qty: 30 | Fill #0

## 2017-11-04 ENCOUNTER — Other Ambulatory Visit: Payer: Self-pay | Admitting: Family Medicine

## 2017-11-04 DIAGNOSIS — Z1231 Encounter for screening mammogram for malignant neoplasm of breast: Secondary | ICD-10-CM

## 2017-11-25 MED FILL — LOSARTAN POTASSIUM 25 MG TA: 25 | 30 days supply | Qty: 60 | Fill #2

## 2017-11-25 MED FILL — DIGOXIN 0.125 MG TABLET: 125 | 30 days supply | Qty: 30 | Fill #1

## 2017-12-19 ENCOUNTER — Other Ambulatory Visit (HOSPITAL_COMMUNITY): Payer: Self-pay | Admitting: *Deleted

## 2017-12-19 ENCOUNTER — Ambulatory Visit (HOSPITAL_COMMUNITY)
Admission: RE | Admit: 2017-12-19 | Discharge: 2017-12-19 | Disposition: A | Discharge status: Home / Self Care | Payer: PPO | Source: Ambulatory Visit | Attending: Internal Medicine | Admitting: Internal Medicine

## 2017-12-19 ENCOUNTER — Ambulatory Visit
Admission: RE | Admit: 2017-12-19 | Discharge: 2017-12-19 | Disposition: A | Discharge status: Home / Self Care | Payer: PPO | Source: Ambulatory Visit | Attending: Family Medicine | Admitting: Family Medicine

## 2017-12-19 ENCOUNTER — Encounter (HOSPITAL_COMMUNITY): Payer: Self-pay | Admitting: Internal Medicine

## 2017-12-19 ENCOUNTER — Telehealth (HOSPITAL_COMMUNITY): Payer: Self-pay | Admitting: *Deleted

## 2017-12-19 VITALS — BP 118/82 | HR 73 | Wt 145.8 lb

## 2017-12-19 DIAGNOSIS — Z79899 Other long term (current) drug therapy: Secondary | ICD-10-CM | POA: Diagnosis not present

## 2017-12-19 DIAGNOSIS — I429 Cardiomyopathy, unspecified: Secondary | ICD-10-CM | POA: Insufficient documentation

## 2017-12-19 DIAGNOSIS — I471 Supraventricular tachycardia: Secondary | ICD-10-CM | POA: Insufficient documentation

## 2017-12-19 DIAGNOSIS — Z9221 Personal history of antineoplastic chemotherapy: Secondary | ICD-10-CM | POA: Insufficient documentation

## 2017-12-19 DIAGNOSIS — Z853 Personal history of malignant neoplasm of breast: Secondary | ICD-10-CM | POA: Insufficient documentation

## 2017-12-19 DIAGNOSIS — Z1231 Encounter for screening mammogram for malignant neoplasm of breast: Secondary | ICD-10-CM | POA: Diagnosis not present

## 2017-12-19 DIAGNOSIS — I493 Ventricular premature depolarization: Secondary | ICD-10-CM | POA: Insufficient documentation

## 2017-12-19 DIAGNOSIS — R002 Palpitations: Secondary | ICD-10-CM | POA: Diagnosis not present

## 2017-12-19 DIAGNOSIS — Z9889 Other specified postprocedural states: Secondary | ICD-10-CM | POA: Insufficient documentation

## 2017-12-19 DIAGNOSIS — Z888 Allergy status to other drugs, medicaments and biological substances status: Secondary | ICD-10-CM | POA: Diagnosis not present

## 2017-12-19 DIAGNOSIS — R05 Cough: Secondary | ICD-10-CM | POA: Insufficient documentation

## 2017-12-19 DIAGNOSIS — I340 Nonrheumatic mitral (valve) insufficiency: Secondary | ICD-10-CM | POA: Diagnosis not present

## 2017-12-19 DIAGNOSIS — Z923 Personal history of irradiation: Secondary | ICD-10-CM | POA: Diagnosis not present

## 2017-12-19 DIAGNOSIS — R42 Dizziness and giddiness: Secondary | ICD-10-CM | POA: Diagnosis not present

## 2017-12-19 DIAGNOSIS — Z803 Family history of malignant neoplasm of breast: Secondary | ICD-10-CM | POA: Diagnosis not present

## 2017-12-19 DIAGNOSIS — Z885 Allergy status to narcotic agent status: Secondary | ICD-10-CM | POA: Diagnosis not present

## 2017-12-19 DIAGNOSIS — R911 Solitary pulmonary nodule: Secondary | ICD-10-CM | POA: Diagnosis not present

## 2017-12-19 DIAGNOSIS — I5022 Chronic systolic (congestive) heart failure: Secondary | ICD-10-CM | POA: Diagnosis not present

## 2017-12-19 DIAGNOSIS — Z801 Family history of malignant neoplasm of trachea, bronchus and lung: Secondary | ICD-10-CM | POA: Insufficient documentation

## 2017-12-19 DIAGNOSIS — Z87891 Personal history of nicotine dependence: Secondary | ICD-10-CM | POA: Insufficient documentation

## 2017-12-19 DIAGNOSIS — R0602 Shortness of breath: Secondary | ICD-10-CM | POA: Insufficient documentation

## 2017-12-19 DIAGNOSIS — E785 Hyperlipidemia, unspecified: Secondary | ICD-10-CM | POA: Diagnosis not present

## 2017-12-19 DIAGNOSIS — I2510 Atherosclerotic heart disease of native coronary artery without angina pectoris: Secondary | ICD-10-CM | POA: Insufficient documentation

## 2017-12-19 DIAGNOSIS — R0789 Other chest pain: Secondary | ICD-10-CM | POA: Diagnosis not present

## 2017-12-19 DIAGNOSIS — Z8249 Family history of ischemic heart disease and other diseases of the circulatory system: Secondary | ICD-10-CM | POA: Diagnosis not present

## 2017-12-19 DIAGNOSIS — Z808 Family history of malignant neoplasm of other organs or systems: Secondary | ICD-10-CM | POA: Insufficient documentation

## 2017-12-19 LAB — BASIC METABOLIC PANEL
Anion gap: 11 (ref 5–15)
BUN: 17 mg/dL (ref 6–20)
CO2: 21 mmol/L — ABNORMAL LOW (ref 22–32)
CREATININE: 0.77 mg/dL (ref 0.44–1.00)
Calcium: 9.3 mg/dL (ref 8.9–10.3)
Chloride: 106 mmol/L (ref 101–111)
GFR calc Af Amer: >60 mL/min (ref >60)
GLUCOSE: 104 mg/dL — AB (ref 65–99)
Potassium: 4.3 mmol/L (ref 3.5–5.1)
Sodium: 138 mmol/L (ref 135–145)

## 2017-12-19 LAB — BRAIN NATRIURETIC PEPTIDE: B Natriuretic Peptide: 64.2 pg/mL (ref 0.0–100.0)

## 2017-12-19 MED ORDER — DIGOXIN 125 MCG PO TABS: 0.1250 mg | ORAL_TABLET | Freq: Every day | ORAL | 3 refills | Status: DC

## 2017-12-19 MED FILL — DIGOXIN 0.125 MG TABLET: 125 | 90 days supply | Qty: 90 | Fill #0

## 2017-12-19 NOTE — Patient Instructions (Signed)
Labs today (will call for abnormal results, otherwise no news is good news)  Cardiopulmonary Stress Test has been ordered for you, please see instruction sheet.  Follow up in 2-3 Months.

## 2017-12-19 NOTE — Telephone Encounter (Signed)
Patient requested for 6 month temporary disability parking tag.  Dr. Haroldine Laws signed formed and I have mailed to patient's home address per pt request.

## 2017-12-19 NOTE — Progress Notes (Signed)
Advanced Heart Failure Clinic Note   Primary Cardiologist: Dr. 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   Admitted 02/04/17-02/07/17 with new onset acute systolic CHF. EF 15-20%. Right and left heart cath done and showed Fick output/index 3.1/1.8. No CAD. She was started on losartan, Spiro and digoxin. Discharge weight was 140 pounds.   Holter monitor 8/18: Multiform PVCs, couplets, and triplets were noted with periods of bigeminy. PVC burden < 1%.No VT observed.  She presents for regular f/u. Says she was doing fine until about a month ago. Now feels much worse. Very tired. Hard to get around much. Gets SOB with ADLs. No LE edema. +Ab bloating. + worsening cough. A lot of chest heaviness. Weight stable. Occasional dizziness. Feels so bad she is now resigning from work   cMRI 12/18 EF 35%. Minimal LGE  Echo 05/09/17 EF ~30% RV ok. Personally reviewed  Echo 02/05/17 LVEF 15-20%, Grade 1 DD, Mild/Mod central MR, Mild LAE  R/LHC 02/06/17  There is severe left ventricular systolic dysfunction.  Dist Cx lesion, 20 %stenosed.  1st Mrg lesion, 60 %stenosed.  Dist RCA lesion, 20 %stenosed.  Mid LAD to Dist LAD lesion, 20 %stenosed.   Findings:  Ao = 80/56 (68) LV = 86/14 RA = 2  RV = 22/2 PA = 24/11 (17) PCW = 6 Fick cardiac output/index = 3.1/1.8 PVR = 3.6 WU Ao sat = 94% PA sat = 58%, 58%  Cardiac MRI 06/2017 1.  Moderate LVE with diffuse hypokinesis EF 35% 2. Small area of apical hyperenhancement on delayed gadolinium images 3.  Mild MR 4.  Mild LAE 5. Thickened tri leaflet AV with restricted leaflet motion suggest echo for AS correlation 6.   Normal RV size and function 7.  Normal aortic root 3.0 cm  Review of systems complete and found to be negative unless listed in HPI.    Past Medical History:  Diagnosis Date  . Arthritis    ostearthritis -joints, greater left knee.  . Breast cancer, left breast ([place]) 2009   S/P surgery, radiation, chemotherapy.-no further oncology visits.  . CHF (congestive heart failure) ([place]) dx'd 02/05/2017  . Hyperlipidemia   . Neuromuscular disorder ([place])    neuropathy due to chemotherapy  . Personal history of chemotherapy 2009  . Personal history of radiation therapy 2009  . Seizures ([place])    "childbirth related"  . SVT (supraventricular tachycardia) ([facility])    x1 episode -3 yrs ago- no issues since.All heart testing negative. No further cardiac visits needed.   Current Outpatient Medications  Medication Sig Dispense Refill  . amiodarone (PACERONE) 100 MG tablet Take 1 tablet (100 mg total) by mouth daily. 90 tablet 3  . Cholecalciferol (VITAMIN D-3) 1000 units CAPS Take 2 capsules by mouth daily.     . digoxin (LANOXIN) 0.125 MG tablet TAKE 1 TABLET (0.125 MG TOTAL) BY MOUTH DAILY. 30 tablet 3  . furosemide (LASIX) 20 MG tablet Take 20 mg by mouth daily as needed.    . ivabradine (CORLANOR) 5 MG TABS tablet Take 1 tablet (5 mg total) by mouth 2 (two) times daily with a meal. 180 tablet 3  . losartan (COZAAR) 25 MG  tablet Take 1 tablet (25 mg total) by mouth 2 (two) times daily. 60 tablet 3  . Magnesium 400 MG TABS Take 1 tablet by mouth daily.     . promethazine-dextromethorphan (PROMETHAZINE-DM) 6.25-15 MG/5ML syrup Take 5 mLs by mouth 4 (four) times daily as needed. 240 mL 0  . spironolactone (ALDACTONE) 25 MG tablet Take 0.5 tablets (12.5 mg total) by mouth daily. 30 tablet 3   No current facility-administered medications for this encounter.    Allergies  Allergen Reactions  . Effexor [Venlafaxine] Other (See Comments)    Caused depression, negative thoughts, and crying   .  Hydrocodone-Acetaminophen Itching and Nausea And Vomiting    Patient can tolerate with Zofran    Social History   Socioeconomic History  . Marital status: Married    Spouse name: Not on file  . Number of children: Not on file  . Years of education: Not on file  . Highest education level: Not on file  Occupational History  . Not on file  Social Needs  . Financial resource strain: Not on file  . Food insecurity:    Worry: Not on file    Inability: Not on file  . Transportation needs:    Medical: Not on file    Non-medical: Not on file  Tobacco Use  . Smoking status: Former Smoker    Packs/day: 2.00    Years: 10.00    Pack years: 20.00    Types: Cigarettes    Last attempt to quit: 07/09/1989    Years since quitting: 28.4  . Smokeless tobacco: Never Used  Substance and Sexual Activity  . Alcohol use: No  . Drug use: No  . Sexual activity: Yes  Lifestyle  . Physical activity:    Days per week: Not on file    Minutes per session: Not on file  . Stress: Not on file  Relationships  . Social connections:    Talks on phone: Not on file    Gets together: Not on file    Attends religious service: Not on file    Active member of club or organization: Not on file    Attends meetings of clubs or organizations: Not on file    Relationship status: Not on file  . Intimate partner violence:    Fear of current or ex partner: Not on file    Emotionally abused: Not on file    Physically abused: Not on file    Forced sexual activity: Not on file  Other Topics Concern  . Not on file  Social History Narrative  . Not on file   Family History  Problem Relation Age of Onset  . Heart disease Mother   . Hypertension Father   . Breast cancer Sister   . Lung cancer Sister   . Skin cancer Brother    Vitals:   12/19/17 1004  BP: 118/82  Pulse: 73  SpO2: 98%  Weight: 145 lb 12.8 oz (66.1 kg)    Wt Readings from Last 3 Encounters:  12/19/17 145 lb 12.8 oz (66.1 kg)  09/30/17 145  lb (65.8 kg)  09/12/17 145 lb (65.8 kg)     PHYSICAL EXAM: General:  Appears well. No resp difficulty HEENT: normal Neck: supple. no JVD. Carotids 2+ bilat; no bruits. No lymphadenopathy or thryomegaly appreciated. Cor: PMI nondisplaced. Regular rate & rhythm. No rubs, gallops or murmurs. Lungs: clear Abdomen: soft, nontender, nondistended. No hepatosplenomegaly. No bruits or masses. Good bowel sounds. Extremities: no cyanosis, clubbing,  rash, edema Neuro: alert & orientedx3, cranial nerves grossly intact. moves all 4 extremities w/o difficulty. Affect pleasant  ECG: NSR 71 LAFBP. u-waves Personally reviewed   ASSESSMENT & PLAN:  1. Chronic systolic CHF: NICM, EF 41-74%, felt to be related to doxorubicin therapy. But also with frequent PVC's, holter monitor showed 1% PVC's but patient complained of very frequent PVC's. Echo reviewed personally today EF improved ~30%. LV size also improved. Cardiac MRI 12/18 EF 35% minimal LGE   - Much worse over the past month despite improving EF - NYHA IIIB-IV - Volume status status on prn lasix - Continue Spiro 12.5 mg daily. Did not tolerate higher dose. - Continue losartan 25 mg bid - Continue digoxin 0.125 mg daily. Level 0.6 04/02/17.  - Continue corlanor 5 mg BID.  - Continue amiodarone 100 mg daily.  - B-blocker prohibited by orthostasis. - Symptoms much worse but exam appears fairly normal. Will plan CPX testing +/- RHC to further assess. Reinforced fluid restriction to < 2 L daily, sodium restriction to less than 2000 mg daily, and the importance of daily weights.    2. Palpitations - Holter monitor with PVC burden <1%. - Continue amiodarone 100 mg daily. No PVCs on ECG. Would like to wean off amio eventually  3. Mitral Regurgitation - Mild on cMRI 07/07/2017. No change.   4. Pulmonary nodule - stable on f/u CT 11/18  5. Chest Pain/pressure - Unclear etiology. 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

## 2017-12-20 ENCOUNTER — Encounter: Payer: Self-pay | Admitting: General Practice

## 2017-12-20 MED FILL — LOSARTAN POTASSIUM 25 MG TA: 25 | 30 days supply | Qty: 60 | Fill #3

## 2018-01-06 MED FILL — CORLANOR 5 MG TABLET: 5 | 90 days supply | Qty: 180 | Fill #1

## 2018-01-06 MED FILL — AMIODARONE HCL 200 MG TAB: 200 | 90 days supply | Qty: 90 | Fill #2

## 2018-01-07 ENCOUNTER — Ambulatory Visit (HOSPITAL_COMMUNITY): Payer: PPO | Attending: Internal Medicine

## 2018-01-07 DIAGNOSIS — I5022 Chronic systolic (congestive) heart failure: Secondary | ICD-10-CM | POA: Diagnosis not present

## 2018-01-08 ENCOUNTER — Other Ambulatory Visit (HOSPITAL_COMMUNITY): Payer: Self-pay | Admitting: Internal Medicine

## 2018-01-08 ENCOUNTER — Other Ambulatory Visit (HOSPITAL_COMMUNITY): Payer: Self-pay | Admitting: *Deleted

## 2018-01-08 DIAGNOSIS — I5022 Chronic systolic (congestive) heart failure: Secondary | ICD-10-CM

## 2018-01-23 ENCOUNTER — Other Ambulatory Visit (HOSPITAL_COMMUNITY): Payer: Self-pay | Admitting: Internal Medicine

## 2018-01-23 MED FILL — SPIRONOLACTONE 25 MG TABLET: 25 | 30 days supply | Qty: 15 | Fill #0

## 2018-02-19 ENCOUNTER — Ambulatory Visit (HOSPITAL_COMMUNITY)
Admission: RE | Admit: 2018-02-19 | Discharge: 2018-02-19 | Disposition: A | Discharge status: Home / Self Care | Payer: PPO | Source: Ambulatory Visit | Attending: Internal Medicine | Admitting: Internal Medicine

## 2018-02-19 VITALS — BP 106/62 | HR 84 | Wt 150.4 lb

## 2018-02-19 DIAGNOSIS — Z87891 Personal history of nicotine dependence: Secondary | ICD-10-CM | POA: Diagnosis not present

## 2018-02-19 DIAGNOSIS — R0683 Snoring: Secondary | ICD-10-CM | POA: Diagnosis not present

## 2018-02-19 DIAGNOSIS — I2510 Atherosclerotic heart disease of native coronary artery without angina pectoris: Secondary | ICD-10-CM | POA: Diagnosis not present

## 2018-02-19 DIAGNOSIS — R002 Palpitations: Secondary | ICD-10-CM | POA: Diagnosis not present

## 2018-02-19 DIAGNOSIS — Z853 Personal history of malignant neoplasm of breast: Secondary | ICD-10-CM | POA: Diagnosis not present

## 2018-02-19 DIAGNOSIS — E785 Hyperlipidemia, unspecified: Secondary | ICD-10-CM | POA: Insufficient documentation

## 2018-02-19 DIAGNOSIS — Z9221 Personal history of antineoplastic chemotherapy: Secondary | ICD-10-CM | POA: Insufficient documentation

## 2018-02-19 DIAGNOSIS — Z79899 Other long term (current) drug therapy: Secondary | ICD-10-CM | POA: Diagnosis not present

## 2018-02-19 DIAGNOSIS — I340 Nonrheumatic mitral (valve) insufficiency: Secondary | ICD-10-CM | POA: Insufficient documentation

## 2018-02-19 DIAGNOSIS — R911 Solitary pulmonary nodule: Secondary | ICD-10-CM | POA: Diagnosis not present

## 2018-02-19 DIAGNOSIS — I471 Supraventricular tachycardia: Secondary | ICD-10-CM | POA: Diagnosis not present

## 2018-02-19 DIAGNOSIS — I429 Cardiomyopathy, unspecified: Secondary | ICD-10-CM | POA: Insufficient documentation

## 2018-02-19 DIAGNOSIS — R079 Chest pain, unspecified: Secondary | ICD-10-CM | POA: Insufficient documentation

## 2018-02-19 DIAGNOSIS — Z923 Personal history of irradiation: Secondary | ICD-10-CM | POA: Insufficient documentation

## 2018-02-19 DIAGNOSIS — I5022 Chronic systolic (congestive) heart failure: Secondary | ICD-10-CM | POA: Diagnosis not present

## 2018-02-19 DIAGNOSIS — I493 Ventricular premature depolarization: Secondary | ICD-10-CM | POA: Diagnosis not present

## 2018-02-19 NOTE — Progress Notes (Addendum)
Advanced Heart Failure Clinic Note   Primary Cardiologist: Dr. 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   Admitted 02/04/17-02/07/17 with new onset acute systolic CHF. EF 15-20%. Right and left heart cath done and showed Fick output/index 3.1/1.8. No CAD. She was started on losartan, Spiro and digoxin. Discharge weight was 140 pounds.   Holter monitor 8/18: Multiform PVCs, couplets, and triplets were noted with periods of bigeminy. PVC burden < 1%.No VT observed.  Today she returns for 2 month follow-up. Overall feeling ok. Says every day she has bad moments. Complaining of chest discomfort on a daily basis at rest or with exertion. Having some day time fatigue. Really no change. Does not feel it is getting better. Says it eventually goes away. SOB with steps.  Denies PND/Orthopnea. Active and plays golf. Appetite ok. She has recently started fasting diet.  No fever or chills. Weight at home 144-148 pounds. Taking all medications. Now fully retired from [employer]. Dizziness/orthostasis much improved.    CPX 01/2018 Peak VO2: 21.5 (108% predicted peak VO2) VE/VCO2 slope: 48 OUES: 1.84 Peak RER: 0.93  Echo 05/09/17 EF ~30% RV ok. Personally reviewed  Echo 02/05/17 LVEF 15-20%, Grade 1 DD, Mild/Mod central MR, Mild LAE  R/LHC 02/06/17  There is severe left ventricular systolic dysfunction.  Dist Cx lesion, 20 %stenosed.  1st Mrg lesion, 60 %stenosed.  Dist RCA lesion, 20 %stenosed.  Mid LAD to Dist LAD lesion, 20 %stenosed.   Findings: Ao = 80/56 (68) LV = 86/14 RA = 2  RV = 22/2 PA = 24/11 (17) PCW = 6 Fick cardiac output/index = 3.1/1.8 PVR = 3.6  WU Ao sat = 94% PA sat = 58%, 58%  Cardiac MRI 06/2017 1.  Moderate LVE with diffuse hypokinesis EF 35% 2. Small area of apical hyperenhancement on delayed gadolinium images 3.  Mild MR 4.  Mild LAE 5. Thickened tri leaflet AV with restricted leaflet motion suggest echo for AS correlation 6.  Normal RV size and function 7.  Normal aortic root 3.0 cm  Review of systems complete and found to be negative unless listed in HPI.    Past Medical History:  Diagnosis Date  . Arthritis    ostearthritis -joints, greater left knee.  . Breast cancer, left breast ([place]) 2009   S/P surgery, radiation, chemotherapy.-no further oncology visits.  . CHF (congestive heart failure) ([place]) dx'd 02/05/2017  . Hyperlipidemia   . Neuromuscular disorder ([employer])    neuropathy due to chemotherapy  . Personal history of chemotherapy 2009  . Personal history of radiation therapy 2009  . Seizures ([place])    "childbirth related"  . SVT (supraventricular tachycardia) ([facility])    x1 episode -3 yrs ago- no issues since.All heart testing negative. No further cardiac visits needed.   Current Outpatient Medications  Medication Sig Dispense Refill  . amiodarone (PACERONE) 100 MG tablet Take 1 tablet (100 mg total) by mouth daily. 90 tablet 3  . Cholecalciferol (VITAMIN D-3) 1000 units CAPS Take 2 capsules by mouth daily.     . digoxin (LANOXIN) 0.125 MG tablet Take 1 tablet (0.125 mg total) by mouth daily. 90 tablet 3  . furosemide (LASIX) 20  MG tablet Take 20 mg by mouth daily as needed.    . ivabradine (CORLANOR) 5 MG TABS tablet Take 1 tablet (5 mg total) by mouth 2 (two) times daily with a meal. 180 tablet 3  . losartan (COZAAR) 25 MG tablet Take 1 tablet (25 mg total) by mouth 2 (two) times daily. 60 tablet 3  . Magnesium 400 MG TABS Take 1 tablet by mouth daily.     Marland Kitchen spironolactone (ALDACTONE) 25 MG tablet Take 0.5 tablets (12.5 mg total) by mouth daily. 30 tablet 3   No current facility-administered medications  for this encounter.    Allergies  Allergen Reactions  . Effexor [Venlafaxine] Other (See Comments)    Caused depression, negative thoughts, and crying   . Hydrocodone-Acetaminophen Itching and Nausea And Vomiting    Patient can tolerate with Zofran    Social History   Socioeconomic History  . Marital status: Married    Spouse name: Not on file  . Number of children: Not on file  . Years of education: Not on file  . Highest education level: Not on file  Occupational History  . Not on file  Social Needs  . Financial resource strain: Not on file  . Food insecurity:    Worry: Not on file    Inability: Not on file  . Transportation needs:    Medical: Not on file    Non-medical: Not on file  Tobacco Use  . Smoking status: Former Smoker    Packs/day: 2.00    Years: 10.00    Pack years: 20.00    Types: Cigarettes    Last attempt to quit: 07/09/1989    Years since quitting: 28.6  . Smokeless tobacco: Never Used  Substance and Sexual Activity  . Alcohol use: No  . Drug use: No  . Sexual activity: Yes  Lifestyle  . Physical activity:    Days per week: Not on file    Minutes per session: Not on file  . Stress: Not on file  Relationships  . Social connections:    Talks on phone: Not on file    Gets together: Not on file    Attends religious service: Not on file    Active member of club or organization: Not on file    Attends meetings of clubs or organizations: Not on file    Relationship status: Not on file  . Intimate partner violence:    Fear of current or ex partner: Not on file    Emotionally abused: Not on file    Physically abused: Not on file    Forced sexual activity: Not on file  Other Topics Concern  . Not on file  Social History Narrative  . Not on file   Family History  Problem Relation Age of Onset  . Heart disease Mother   . Hypertension Father   . Breast cancer Sister   . Lung cancer Sister   . Skin cancer Brother    Vitals:   02/19/18 1401    BP: 106/62  Pulse: 84  SpO2: 100%  Weight: 68.2 kg (150 lb 6.4 oz)    Wt Readings from Last 3 Encounters:  02/19/18 68.2 kg (150 lb 6.4 oz)  12/19/17 66.1 kg (145 lb 12.8 oz)  09/30/17 65.8 kg (145 lb)     PHYSICAL EXAM: General:  Well appearing. No resp difficulty HEENT: normal Neck: supple. no JVD. Carotids 2+ bilat; no bruits. No lymphadenopathy or thryomegaly appreciated. Cor: PMI nondisplaced. Regular rate &  rhythm. No rubs, gallops or murmurs. Lungs: clear Abdomen: soft, nontender, nondistended. No hepatosplenomegaly. No bruits or masses. Good bowel sounds. Extremities: no cyanosis, clubbing, rash, edema Neuro: alert & orientedx3, cranial nerves grossly intact. moves all 4 extremities w/o difficulty. Affect pleasant  ECG: NSR 74 LAFB. No significant ST-T changes Personally reviewed    ASSESSMENT & PLAN:  1. Chronic systolic CHF: NICM, EF 16-10%, felt to be related to doxorubicin therapy. But also with frequent PVC's, holter monitor showed 1% PVC's but patient complained of very frequent PVC's.  - Echo 05/2017 ~30-35%. LV size - CPX 01/2018 Peak VO2: 21.5 (108% predicted peak VO2) VE/VCO2 slope: 48 Peak RER: 0.93 - Remains NYHA III - Volume status status on prn lasix. Takes rarely. - Continue Spiro 12.5 mg daily. Did not tolerate higher dose. - Continue losartan 25 mg bid - Continue digoxin 0.125 mg daily. Level 0.6 04/02/17.  - Continue corlanor 5 mg BID.  - Continue amiodarone 100 mg daily.  - B-blocker prohibited by orthostasis.  2. Palpitations - Holter monitor with PVC burden <1%. - Continue amiodarone 100 mg daily. No PVCs on ECG. Would like to wean off amio eventually  3. Mitral Regurgitation - Mild on cMRI 07/07/2017. No change.   4. Pulmonary nodule - stable on f/u CT 11/18  5. Chest Pain/pressure - Unclear etiology. 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 Day time fatigue Set up for sleep study.   Follow up in 3  months.   Darrick Grinder, NP  2:07 PM  Patient seen and examined with Darrick Grinder, NP. We discussed all aspects of the encounter. I agree with the assessment and plan as stated above.   Difficult situation. NYHA II-III. Volume status ok. Still with frequent bouts of CP. I reviewed cath films with her and doubt CAD is causing her CP. Unable to tolerate higher doses of HF meds. Will continue current meds. Repeat echo. Refer to CR.   Glori Bickers, MD  3:29 PM

## 2018-02-19 NOTE — Patient Instructions (Addendum)
Referral has been placed for Cardiac Rehab, their office will contact you to setup initial visit.  Sleep Study has been ordered for you, once Insurance approves we'll call you to schedule.   Echocardiogram and Follow up in 2-3 Months with Dr. Haroldine Laws.

## 2018-02-27 ENCOUNTER — Telehealth (HOSPITAL_COMMUNITY): Payer: Self-pay

## 2018-02-27 NOTE — Telephone Encounter (Signed)
Patients insurance is active and benefits verified through [facility] - $15.00 co-pay, no deductible, out of pocket amount of $3,400/$419.47 has been met, no co-insurance, and no pre-authorization is required. Reference #[id number]  Passed referral to RN Navigator for review.

## 2018-02-28 ENCOUNTER — Telehealth (HOSPITAL_COMMUNITY): Payer: Self-pay

## 2018-02-28 NOTE — Telephone Encounter (Signed)
Called patient regarding CR, pt was not able to talk at the time. States she will call back later today.  If no call from patient, will follow up with pt in a week.

## 2018-02-28 NOTE — Telephone Encounter (Signed)
Patient called back to schedule CR, she will attend orientation on 04/08/18 @ 1:30PM and will exercise at 9:45AM.  Mailed homework package.  Went over insurance, patient verbalized understanding.

## 2018-03-14 ENCOUNTER — Ambulatory Visit (HOSPITAL_BASED_OUTPATIENT_CLINIC_OR_DEPARTMENT_OTHER): Payer: PPO | Attending: Internal Medicine | Admitting: Cardiology

## 2018-03-14 VITALS — Ht 64.0 in | Wt 150.0 lb

## 2018-03-14 DIAGNOSIS — R0683 Snoring: Secondary | ICD-10-CM

## 2018-03-14 DIAGNOSIS — G4733 Obstructive sleep apnea (adult) (pediatric): Secondary | ICD-10-CM | POA: Insufficient documentation

## 2018-03-15 NOTE — Procedures (Addendum)
   Patient Name: Patricia Collier, Patricia Collier Date: 03/14/2018 Gender: Female D.O.B: 11/04/1945 Age (years): 71 Referring Provider: Shaune Pascal Bensimhon Height (inches): 14 Interpreting Physician: Fransico Him MD, ABSM Weight (lbs): 150 RPSGT: Lanae Boast BMI: 26 MRN: [id number] Neck Size: 13.50  CLINICAL INFORMATION Sleep Study Type: NPSG  Indication for sleep study: Snoring  SLEEP STUDY TECHNIQUE As per the AASM Manual for the Scoring of Sleep and Associated Events v2.3 (April 2016) with a hypopnea requiring 4% desaturations.  The channels recorded and monitored were frontal, central and occipital EEG, electrooculogram (EOG), submentalis EMG (chin), nasal and oral airflow, thoracic and abdominal wall motion, anterior tibialis EMG, snore microphone, electrocardiogram, and pulse oximetry.  MEDICATIONS Medications self-administered by patient taken the night of the study : N/A  SLEEP ARCHITECTURE The study was initiated at 11:19:57 PM and ended at 5:25:43 AM.  Sleep onset time was 51.9 minutes and the sleep efficiency was 73.8%%. The total sleep time was 269.9 minutes.  Stage REM latency was 198.0 minutes.  The patient spent 17.0%% of the night in stage N1 sleep, 79.1%% in stage N2 sleep, 0.0%% in stage N3 and 3.9% in REM.  Alpha intrusion was absent.  Supine sleep was 20.19%.  RESPIRATORY PARAMETERS The overall apnea/hypopnea index (AHI) was 5.8 per hour. There were 5 total apneas, including 0 obstructive, 5 central and 0 mixed apneas. There were 21 hypopneas and 22 RERAs.  The AHI during Stage REM sleep was 51.4 per hour.  AHI while supine was 20.9 per hour.  The mean oxygen saturation was 94.3%. The minimum SpO2 during sleep was 82.0%.  loud snoring was noted during this study.  CARDIAC DATA The 2 lead EKG demonstrated sinus rhythm. The mean heart rate was 56.8 beats per minute. Other EKG findings include: None.  LEG MOVEMENT DATA The total PLMS were 0 with  a resulting PLMS index of 0.0. Associated arousal with leg movement index was 5.8 .  IMPRESSIONS - Mild obstructive sleep apnea occurred during this study (AHI = 5.8/h). - No significant central sleep apnea occurred during this study (CAI = 1.1/h). - Mild oxygen desaturation was noted during this study (Min O2 = 82.0%). - The patient snored with loud snoring volume. - No cardiac abnormalities were noted during this study. - Clinically significant periodic limb movements did not occur during sleep. Associated arousals were significant.  DIAGNOSIS - Obstructive Sleep Apnea (327.23 [G47.33 ICD-10])  RECOMMENDATIONS - Given severity of sleep disordered breathing during REM sleep and hypoxemia, recommend in lab CPAP titration.  - Positional therapy avoiding supine position during sleep. - Avoid alcohol, sedatives and other CNS depressants that may worsen sleep apnea and disrupt normal sleep architecture. - Sleep hygiene should be reviewed to assess factors that may improve sleep quality. - Weight management and regular exercise should be initiated or continued if appropriate.  [Electronically signed] 03/15/2018 06:44 PM  Fransico Him MD, ABSM Diplomate, American Board of Sleep Medicine

## 2018-03-17 MED FILL — LOSARTAN POTASSIUM 25 MG TA: 25 | 30 days supply | Qty: 60 | Fill #0

## 2018-03-17 MED FILL — SPIRONOLACTONE 25 MG TABLET: 25 | 30 days supply | Qty: 15 | Fill #1

## 2018-03-17 MED FILL — DIGOXIN 0.125 MG TABLET: 125 | 90 days supply | Qty: 90 | Fill #1

## 2018-03-19 ENCOUNTER — Telehealth (HOSPITAL_COMMUNITY): Payer: Self-pay

## 2018-03-19 NOTE — Telephone Encounter (Signed)
Patient returned call, she will come in for orientation on 04/08/18 @ 8:30.

## 2018-03-19 NOTE — Telephone Encounter (Signed)
Attempted to call pt in regards to moving her to an AM slot for orientation on 10/1. LMTCB

## 2018-03-20 ENCOUNTER — Telehealth (HOSPITAL_COMMUNITY): Payer: Self-pay

## 2018-03-21 ENCOUNTER — Other Ambulatory Visit: Payer: Self-pay | Admitting: Cardiology

## 2018-03-21 ENCOUNTER — Telehealth: Payer: Self-pay | Admitting: *Deleted

## 2018-03-21 ENCOUNTER — Ambulatory Visit (HOSPITAL_COMMUNITY)
Admission: RE | Admit: 2018-03-21 | Discharge: 2018-03-21 | Disposition: A | Discharge status: Home / Self Care | Payer: PPO | Source: Ambulatory Visit | Attending: Family Medicine | Admitting: Family Medicine

## 2018-03-21 DIAGNOSIS — Z853 Personal history of malignant neoplasm of breast: Secondary | ICD-10-CM | POA: Insufficient documentation

## 2018-03-21 DIAGNOSIS — Z87891 Personal history of nicotine dependence: Secondary | ICD-10-CM | POA: Diagnosis not present

## 2018-03-21 DIAGNOSIS — G4733 Obstructive sleep apnea (adult) (pediatric): Secondary | ICD-10-CM

## 2018-03-21 DIAGNOSIS — E785 Hyperlipidemia, unspecified: Secondary | ICD-10-CM | POA: Diagnosis not present

## 2018-03-21 DIAGNOSIS — I080 Rheumatic disorders of both mitral and aortic valves: Secondary | ICD-10-CM | POA: Diagnosis not present

## 2018-03-21 DIAGNOSIS — I5022 Chronic systolic (congestive) heart failure: Secondary | ICD-10-CM | POA: Diagnosis not present

## 2018-03-21 NOTE — Telephone Encounter (Signed)
-----   Message from Lauralee Evener, [place] sent at 03/21/2018  2:19 PM EDT ----- CPAP titration

## 2018-03-21 NOTE — Progress Notes (Signed)
  Echocardiogram 2D Echocardiogram has been performed.  Patricia Collier 03/21/2018, 1:51 PM

## 2018-03-21 NOTE — Telephone Encounter (Signed)
Informed patient of sleep study results and patient understanding was verbalized.  

## 2018-03-21 NOTE — Telephone Encounter (Signed)
-----   Message from Sueanne Margarita, MD sent at 03/15/2018  6:56 PM EDT ----- Please let patient know that they have sleep apnea and recommend CPAP titration. Please set up titration in the sleep lab.

## 2018-03-21 NOTE — Telephone Encounter (Signed)
PA submitted via web portal for CPAP titration. 

## 2018-03-26 ENCOUNTER — Telehealth: Payer: Self-pay | Admitting: *Deleted

## 2018-03-26 ENCOUNTER — Encounter: Payer: Self-pay | Admitting: *Deleted

## 2018-03-26 NOTE — Telephone Encounter (Signed)
Staff message sent to Patricia Collier received. Ok to schedule CPAP titration. Information provided to assign referral.

## 2018-03-26 NOTE — Telephone Encounter (Addendum)
Patient is scheduled for CPAP Titration on 05/06/18. Patient understands her titration study will be done at [facility] sleep lab. Patient understands she will receive a letter in a week or so detailing appointment, date, time, and location. Patient understands to call if she does not receive the letter  in a timely manner.

## 2018-03-26 NOTE — Telephone Encounter (Signed)
-----   Message from Lauralee Evener, [place] sent at 03/21/2018  2:19 PM EDT ----- CPAP titration

## 2018-03-26 NOTE — Telephone Encounter (Signed)
Informed patient of sleep study results and patient understanding was verbalized. Patient understands her sleep study showed they have sleep apnea and recommend CPAP titration. Pt is aware and agreeable to these results. 

## 2018-03-26 NOTE — Telephone Encounter (Signed)
-----   Message from Lauralee Evener, [place] sent at 03/26/2018 12:24 PM EDT -----   ----- Message ----- From: Lauralee Evener, CMA Sent: 03/26/2018  12:20 PM EDT To: Lauralee Evener, CMA  Received approval from [employer]. Ok to schedule. Auth # [id number]. Valid 03/21/18 to 06/19/18. Assign referral. ----- Message ----- From: Lauralee Evener, CMA Sent: 03/21/2018   2:19 PM EDT To: [facility]  CPAP titration

## 2018-03-26 NOTE — Telephone Encounter (Signed)
-----   Message from Sueanne Margarita, MD sent at 03/15/2018  6:56 PM EDT ----- Please let patient know that they have sleep apnea and recommend CPAP titration. Please set up titration in the sleep lab.

## 2018-04-03 ENCOUNTER — Telehealth (HOSPITAL_COMMUNITY): Payer: Self-pay

## 2018-04-03 NOTE — Telephone Encounter (Signed)
Cardiac Rehab Medication Review by a Pharmacist  Does the patient  feel that his/her medications are working for him/her?  yes  Has the patient been experiencing any side effects to the medications prescribed?  no  Does the patient measure his/her own blood pressure or blood glucose at home?  yes - BP 106-120/60-70  Does the patient have any problems obtaining medications due to transportation or finances?   no  Understanding of regimen: excellent Understanding of indications: excellent Potential of compliance: excellent    Pharmacist comments: Pt reports doing well on medications with no issues.     Burnard Leigh [facility] 04/03/2018 5:13 PM

## 2018-04-06 ENCOUNTER — Encounter (HOSPITAL_BASED_OUTPATIENT_CLINIC_OR_DEPARTMENT_OTHER): Payer: PPO

## 2018-04-07 ENCOUNTER — Telehealth (HOSPITAL_COMMUNITY): Payer: Self-pay | Admitting: *Deleted

## 2018-04-07 NOTE — Telephone Encounter (Signed)
Returned call to pt from conversation earlier today.  Pt scheduled for cardiac rehab orientation on tomorrow.  Upon review in preparation for orientation appt, pt with recent TEE on 9/12 which showed improved EF to 40-45%.  Pt has health team advantage which follows medicare guidelines. Per medicare guidelines pt EF must be 35% or below.  This was verified by phone with support staff.  In basket message sent to referring provider, Dr. Haroldine Laws for advisement of situation and to see in he felt pulmonary rehab (non tele) was appropriate for this pt.  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  Advised pt of this information, pt wished to cancel appt for tomorrow and speak with Dr. Haroldine Laws in greater detail during upcoming appt in November. Pt did not feel she was acutely having any issues and is delighted her EF has improved so much.  Asked pt to please keep [place] in the loop. Pt verbalized that she would and she would se if she could be seen sooner in the heart failure clinic. [name], BSN Cardiac and Training and development officer

## 2018-04-07 NOTE — Progress Notes (Signed)
PRICSILLA LINDVALL 72 y.o. female DOB July 11, 1945 MRN [id number]       Nutrition  No diagnosis found. Past Medical History:  Diagnosis Date  . Arthritis    ostearthritis -joints, greater left knee.  . Breast cancer, left breast ([place]) 2009   S/P surgery, radiation, chemotherapy.-no further oncology visits.  . CHF (congestive heart failure) ([place]) dx'd 02/05/2017  . Hyperlipidemia   . Neuromuscular disorder ([place])    neuropathy due to chemotherapy  . Personal history of chemotherapy 2009  . Personal history of radiation therapy 2009  . Seizures ([place])    "childbirth related"  . SVT (supraventricular tachycardia) ([facility])    x1 episode -3 yrs ago- no issues since.All heart testing negative. No further cardiac visits needed.   Meds reviewed.     Current Outpatient Medications (Cardiovascular):  .  amiodarone (PACERONE) 100 MG tablet, Take 1 tablet (100 mg total) by mouth daily. .  digoxin (LANOXIN) 0.125 MG tablet, Take 1 tablet (0.125 mg total) by mouth daily. .  furosemide (LASIX) 20 MG tablet, Take 20 mg by mouth daily as needed. .  ivabradine (CORLANOR) 5 MG TABS tablet, Take 1 tablet (5 mg total) by mouth 2 (two) times daily with a meal. .  losartan (COZAAR) 25 MG tablet, Take 1 tablet (25 mg total) by mouth 2 (two) times daily. Marland Kitchen  spironolactone (ALDACTONE) 25 MG tablet, Take 0.5 tablets (12.5 mg total) by mouth daily.     Current Outpatient Medications (Other):  Marland Kitchen  Cholecalciferol (VITAMIN D-3) 1000 units CAPS, Take 2 capsules by mouth daily.  .  Magnesium 400 MG TABS, Take 1 tablet by mouth daily.    HT: Ht Readings from Last 1 Encounters:  03/14/18 5\' 4"  (1.626 m)    WT: Wt Readings from Last 5 Encounters:  03/14/18 150 lb (68 kg)  02/19/18 150 lb 6.4 oz (68.2 kg)  12/19/17 145 lb 12.8 oz (66.1 kg)  09/30/17 145 lb (65.8 kg)  09/12/17 145 lb (65.8 kg)     There is no height or weight on file to calculate BMI.   Current tobacco use? No       Labs:  Lipid Panel      Component Value Date/Time   CHOL 200 01/03/2017 1514   TRIG 225.0 (H) 01/03/2017 1514   HDL 45.40 01/03/2017 1514   CHOLHDL 4 01/03/2017 1514   VLDL 45.0 (H) 01/03/2017 1514   LDLCALC 175 (H) 11/16/2013 1437   LDLDIRECT 109.0 01/03/2017 1514    No results found for: HGBA1C CBG (last 3)  No results for input(s): GLUCAP in the last 72 hours.  Nutrition Diagnosis ? Food-and nutrition-related knowledge deficit related to lack of exposure to information as related to diagnosis of: ? CVD ? CHF  Nutrition Goal(s):  ? To be determined  Plan:  Pt to attend nutrition classes ? Nutrition I ? Nutrition II ? Portion Distortion  Will provide client-centered nutrition education as part of interdisciplinary care.   Monitor and evaluate progress toward nutrition goal with team.  Laurina Bustle, MS, RD, LDN 04/07/2018 11:46 AM

## 2018-04-08 ENCOUNTER — Inpatient Hospital Stay (HOSPITAL_COMMUNITY)
Admission: RE | Admit: 2018-04-08 | Discharge: 2018-04-08 | Disposition: A | Discharge status: Home / Self Care | Payer: PPO | Source: Ambulatory Visit

## 2018-04-16 ENCOUNTER — Ambulatory Visit (HOSPITAL_COMMUNITY): Payer: PPO

## 2018-04-18 ENCOUNTER — Ambulatory Visit (HOSPITAL_COMMUNITY): Payer: PPO

## 2018-04-21 ENCOUNTER — Ambulatory Visit (HOSPITAL_COMMUNITY): Payer: PPO

## 2018-04-23 ENCOUNTER — Ambulatory Visit (HOSPITAL_COMMUNITY): Payer: PPO

## 2018-04-23 MED FILL — LOSARTAN POTASSIUM 25 MG TA: 25 | 30 days supply | Qty: 60 | Fill #1

## 2018-04-23 MED FILL — SPIRONOLACTONE 25 MG TABLET: 25 | 30 days supply | Qty: 15 | Fill #2

## 2018-04-25 ENCOUNTER — Ambulatory Visit (HOSPITAL_COMMUNITY): Payer: PPO

## 2018-04-28 ENCOUNTER — Ambulatory Visit (HOSPITAL_COMMUNITY): Payer: PPO

## 2018-04-30 ENCOUNTER — Ambulatory Visit (HOSPITAL_COMMUNITY): Payer: PPO

## 2018-05-02 ENCOUNTER — Ambulatory Visit (HOSPITAL_COMMUNITY): Payer: PPO

## 2018-05-02 ENCOUNTER — Encounter (HOSPITAL_BASED_OUTPATIENT_CLINIC_OR_DEPARTMENT_OTHER): Payer: PPO

## 2018-05-05 ENCOUNTER — Ambulatory Visit (HOSPITAL_COMMUNITY): Payer: PPO

## 2018-05-06 ENCOUNTER — Ambulatory Visit (HOSPITAL_BASED_OUTPATIENT_CLINIC_OR_DEPARTMENT_OTHER): Payer: PPO | Attending: Cardiology | Admitting: Cardiology

## 2018-05-06 VITALS — Ht 64.0 in | Wt 150.0 lb

## 2018-05-06 DIAGNOSIS — G4733 Obstructive sleep apnea (adult) (pediatric): Secondary | ICD-10-CM | POA: Diagnosis not present

## 2018-05-07 ENCOUNTER — Ambulatory Visit (HOSPITAL_COMMUNITY): Payer: PPO

## 2018-05-07 NOTE — Procedures (Signed)
   Patient Name: Patricia Collier, Patricia Collier Date: 05/06/2018 Gender: Female D.O.B: Nov 19, 1945 Age (years): 7 Referring Provider: Shaune Pascal Bensimhon Height (inches): 64 Interpreting Physician: Fransico Him MD, ABSM Weight (lbs): 150 RPSGT: Laren Everts BMI: 26 MRN: [id number] Neck Size: 13.50  CLINICAL INFORMATION  The patient is referred for a CPAP titration to treat sleep apnea.  SLEEP STUDY TECHNIQUE  As per the AASM Manual for the Scoring of Sleep and Associated Events v2.3 (April 2016) with a hypopnea requiring 4% desaturations. The channels recorded and monitored were frontal, central and occipital EEG, electrooculogram (EOG), submentalis EMG (chin), nasal and oral airflow, thoracic and abdominal wall motion, anterior tibialis EMG, snore microphone, electrocardiogram, and pulse oximetry. Continuous positive airway pressure (CPAP) was initiated at the beginning of the study and titrated to treat sleep-disordered breathing.  MEDICATIONS  Medications self-administered by patient taken the night of the study : N/A  TECHNICIAN COMMENTS  Comments added by technician: NONE Comments added by scorer: N/A  RESPIRATORY PARAMETERS  Optimal PAP Pressure (cm):9 AHI at Optimal Pressure (/hr):0.4 Overall Minimal O2 (%):87.0 Supine % at Optimal Pressure (%):0 Minimal O2 at Optimal Pressure (%):93.0  SLEEP ARCHITECTURE  The study was initiated at 10:48:02 PM and ended at 5:15:18 AM. Sleep onset time was 21.0 minutes and the sleep efficiency was 87.5%%. The total sleep time was 339 minutes. The patient spent 6.0%% of the night in stage N1 sleep, 68.9%% in stage N2 sleep, 0.0%% in stage N3 and 25.1% in REM.Stage REM latency was 101.0 minutes Wake after sleep onset was 27.2. Alpha intrusion was absent. Supine sleep was 35.10%.  CARDIAC DATA  The 2 lead EKG demonstrated sinus rhythm. The mean heart rate was 55.1 beats per minute. Other EKG findings include: None.  LEG MOVEMENT DATA  The  total Periodic Limb Movements of Sleep (PLMS) were 0. The PLMS index was 0.0. A PLMS index of <15 is considered normal in adults.  IMPRESSIONS  - The optimal PAP pressure was 9 cm of water. - Central sleep apnea was not noted during this titration (CAI = 0.2/h). - Mild oxygen desaturations were observed during this titration (min O2 = 87.0%). - The patient snored with soft snoring volume during this titration study. - No cardiac abnormalities were observed during this study. - Clinically significant periodic limb movements were not noted during this study. Arousals associated with PLMs were rare.  DIAGNOSIS  - Obstructive Sleep Apnea (327.23 [G47.33 ICD-10])  RECOMMENDATIONS  - Trial of CPAP therapy on 9 cm H2O with a Small size Resmed Full Face Mask AirFit F20 mask and heated humidification. - Avoid alcohol, sedatives and other CNS depressants that may worsen sleep apnea and disrupt normal sleep architecture. - Sleep hygiene should be reviewed to assess factors that may improve sleep quality. - Weight management and regular exercise should be initiated or continued. - Return to Sleep Center for re-evaluation after 10 weeks of therapy  [Electronically signed] 05/07/2018 08:40 PM  Fransico Him MD, ABSM Diplomate, American Board of Sleep Medicine

## 2018-05-09 ENCOUNTER — Ambulatory Visit (HOSPITAL_COMMUNITY): Payer: PPO

## 2018-05-09 ENCOUNTER — Telehealth: Payer: Self-pay | Admitting: *Deleted

## 2018-05-09 NOTE — Telephone Encounter (Signed)
-----   Message from Sueanne Margarita, MD sent at 05/07/2018  8:44 PM EDT ----- Please let patient know that they had a successful PAP titration and let DME know that orders are in EPIC.  Please set up 10 week OV with me.

## 2018-05-09 NOTE — Telephone Encounter (Signed)
Informed patient of sleep study results and patient understanding was verbalized. Patient understands her titration study showed they had a successful PAP titration and orders are in Epic. Upon patient request DME selection is [facility]. Patient understands she will be contacted by [facility] to set up her cpap. Patient understands to call if [facility] does not contact her with new setup in a timely manner. Patient understands they will be called once confirmation has been received from [facility] that they have received their new machine to schedule 10 week follow up appointment.  [facility] notified of new cpap order  Please add to airview Patient was grateful for the call and thanked me.

## 2018-05-12 ENCOUNTER — Ambulatory Visit (HOSPITAL_COMMUNITY): Payer: PPO

## 2018-05-14 ENCOUNTER — Ambulatory Visit (HOSPITAL_COMMUNITY): Payer: PPO

## 2018-05-16 ENCOUNTER — Ambulatory Visit (HOSPITAL_COMMUNITY): Payer: PPO

## 2018-05-19 ENCOUNTER — Ambulatory Visit (HOSPITAL_COMMUNITY): Payer: PPO

## 2018-05-20 ENCOUNTER — Ambulatory Visit (HOSPITAL_COMMUNITY)
Admission: RE | Admit: 2018-05-20 | Discharge: 2018-05-20 | Disposition: A | Discharge status: Home / Self Care | Payer: PPO | Source: Ambulatory Visit | Attending: Internal Medicine | Admitting: Internal Medicine

## 2018-05-20 VITALS — BP 128/74 | HR 87 | Wt 151.8 lb

## 2018-05-20 DIAGNOSIS — Z888 Allergy status to other drugs, medicaments and biological substances status: Secondary | ICD-10-CM | POA: Diagnosis not present

## 2018-05-20 DIAGNOSIS — Z87891 Personal history of nicotine dependence: Secondary | ICD-10-CM | POA: Diagnosis not present

## 2018-05-20 DIAGNOSIS — Z885 Allergy status to narcotic agent status: Secondary | ICD-10-CM | POA: Diagnosis not present

## 2018-05-20 DIAGNOSIS — I5022 Chronic systolic (congestive) heart failure: Secondary | ICD-10-CM

## 2018-05-20 DIAGNOSIS — Z79899 Other long term (current) drug therapy: Secondary | ICD-10-CM | POA: Diagnosis not present

## 2018-05-20 DIAGNOSIS — Z8249 Family history of ischemic heart disease and other diseases of the circulatory system: Secondary | ICD-10-CM | POA: Insufficient documentation

## 2018-05-20 DIAGNOSIS — I340 Nonrheumatic mitral (valve) insufficiency: Secondary | ICD-10-CM | POA: Insufficient documentation

## 2018-05-20 DIAGNOSIS — I493 Ventricular premature depolarization: Secondary | ICD-10-CM | POA: Insufficient documentation

## 2018-05-20 DIAGNOSIS — R911 Solitary pulmonary nodule: Secondary | ICD-10-CM | POA: Diagnosis not present

## 2018-05-20 DIAGNOSIS — Z801 Family history of malignant neoplasm of trachea, bronchus and lung: Secondary | ICD-10-CM | POA: Diagnosis not present

## 2018-05-20 DIAGNOSIS — I2510 Atherosclerotic heart disease of native coronary artery without angina pectoris: Secondary | ICD-10-CM | POA: Diagnosis not present

## 2018-05-20 DIAGNOSIS — I5023 Acute on chronic systolic (congestive) heart failure: Secondary | ICD-10-CM | POA: Insufficient documentation

## 2018-05-20 LAB — BASIC METABOLIC PANEL
Anion gap: 4 — ABNORMAL LOW (ref 5–15)
BUN: 13 mg/dL (ref 8–23)
CHLORIDE: 109 mmol/L (ref 98–111)
CO2: 23 mmol/L (ref 22–32)
CREATININE: 0.83 mg/dL (ref 0.44–1.00)
Calcium: 9.2 mg/dL (ref 8.9–10.3)
GFR calc Af Amer: >60 mL/min (ref >60)
GFR calc non Af Amer: >60 mL/min (ref >60)
GLUCOSE: 114 mg/dL — AB (ref 70–99)
Potassium: 4.3 mmol/L (ref 3.5–5.1)
SODIUM: 136 mmol/L (ref 135–145)

## 2018-05-20 LAB — BRAIN NATRIURETIC PEPTIDE: B NATRIURETIC PEPTIDE 5: 47.2 pg/mL (ref 0.0–100.0)

## 2018-05-20 MED ORDER — SPIRONOLACTONE 25 MG PO TABS: 12.5000 mg | ORAL_TABLET | Freq: Every day | ORAL | 3 refills | Status: DC

## 2018-05-20 MED ORDER — CARVEDILOL 3.125 MG PO TABS: 3.1250 mg | ORAL_TABLET | Freq: Two times a day (BID) | ORAL | 6 refills | Status: DC

## 2018-05-20 MED ORDER — CARVEDILOL 3.125 MG PO TABS: 3.1250 mg | ORAL_TABLET | Freq: Two times a day (BID) | ORAL | 2 refills | Status: DC

## 2018-05-20 MED FILL — SPIRONOLACTONE 25 MG TABLET: 25 | 90 days supply | Qty: 45 | Fill #0

## 2018-05-20 MED FILL — CARVEDILOL 3.125 MG TABLET: 3.125 | 90 days supply | Qty: 180 | Fill #0

## 2018-05-20 NOTE — Addendum Note (Signed)
Encounter addended by: Scarlette Calico, RN on: 05/20/2018 11:14 AM  Actions taken: Order list changed

## 2018-05-20 NOTE — Progress Notes (Signed)
Advanced Heart Failure Clinic Note   Primary Cardiologist: Dr. 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   Admitted 02/04/17-02/07/17 with new onset acute systolic CHF. EF 15-20%. Right and left heart cath done and showed Fick output/index 3.1/1.8. No CAD. She was started on losartan, Spiro and digoxin. Discharge weight was 140 pounds.   Holter monitor 8/18: Multiform PVCs, couplets, and triplets were noted with periods of bigeminy. PVC burden < 1%.No VT observed.  Echo 9/19: EF 45-50%  She presents today for regular follow up. Feels great. Very active. Taking a lot of trips. No CP. HR jumps up and gets SOB if moves too quickly or lifts something heavy. Can shop without problems. Volume status looks good. Taking all medications. Playing golf.    CPX 01/2018 Peak VO2: 21.5 (108% predicted peak VO2) VE/VCO2 slope: 48 OUES: 1.84 Peak RER: 0.93  Echo 05/09/17 EF ~30% RV ok. Personally reviewed  Echo 02/05/17 LVEF 15-20%, Grade 1 DD, Mild/Mod central MR, Mild LAE  R/LHC 02/06/17  There is severe left ventricular systolic dysfunction.  Dist Cx lesion, 20 %stenosed.  1st Mrg lesion, 60 %stenosed.  Dist RCA lesion, 20 %stenosed.  Mid LAD to Dist LAD lesion, 20 %stenosed.   Findings: Ao = 80/56 (68) LV = 86/14 RA = 2  RV = 22/2 PA = 24/11 (17) PCW = 6 Fick cardiac output/index = 3.1/1.8 PVR = 3.6 WU Ao sat = 94% PA sat = 58%, 58%  Cardiac MRI 06/2017 1.  Moderate LVE with diffuse hypokinesis EF 35% 2. Small area of apical hyperenhancement on delayed gadolinium images 3.  Mild MR 4.  Mild LAE 5. Thickened tri leaflet AV with restricted leaflet  motion suggest echo for AS correlation 6.  Normal RV size and function 7.  Normal aortic root 3.0 cm  Review of systems complete and found to be negative unless listed in HPI.    Past Medical History:  Diagnosis Date  . Arthritis    ostearthritis -joints, greater left knee.  . Breast cancer, left breast ([place]) 2009   S/P surgery, radiation, chemotherapy.-no further oncology visits.  . CHF (congestive heart failure) ([place]) dx'd 02/05/2017  . Hyperlipidemia   . Neuromuscular disorder ([place])    neuropathy due to chemotherapy  . Personal history of chemotherapy 2009  . Personal history of radiation therapy 2009  . Seizures ([place])    "childbirth related"  . SVT (supraventricular tachycardia) ([facility])    x1 episode -3 yrs ago- no issues since.All heart testing negative. No further cardiac visits needed.   Current Outpatient Medications  Medication Sig Dispense Refill  . amiodarone (PACERONE) 100 MG tablet Take 1 tablet (100 mg total) by mouth daily. 90 tablet 3  . Cholecalciferol (VITAMIN D-3) 1000 units CAPS Take 2 capsules by mouth daily.     . digoxin (LANOXIN) 0.125 MG tablet Take 1 tablet (0.125 mg total) by mouth daily. 90 tablet 3  . ivabradine (CORLANOR) 5 MG TABS tablet Take 1 tablet (5 mg total) by mouth 2 (two) times daily with a meal. 180 tablet 3  . losartan (COZAAR) 25 MG tablet Take 1 tablet (25 mg total) by mouth 2 (two) times  daily. 60 tablet 3  . Magnesium 400 MG TABS Take 1 tablet by mouth daily.     Marland Kitchen spironolactone (ALDACTONE) 25 MG tablet Take 0.5 tablets (12.5 mg total) by mouth daily. 30 tablet 3  . furosemide (LASIX) 20 MG tablet Take 20 mg by mouth daily as needed.     No current facility-administered medications for this encounter.    Allergies  Allergen Reactions  . Effexor [Venlafaxine] Other (See Comments)    Caused depression, negative thoughts, and crying   . Hydrocodone-Acetaminophen Itching and Nausea And Vomiting    Patient can tolerate with Zofran      Social History   Socioeconomic History  . Marital status: Married    Spouse name: Not on file  . Number of children: Not on file  . Years of education: Not on file  . Highest education level: Not on file  Occupational History  . Not on file  Social Needs  . Financial resource strain: Not on file  . Food insecurity:    Worry: Not on file    Inability: Not on file  . Transportation needs:    Medical: Not on file    Non-medical: Not on file  Tobacco Use  . Smoking status: Former Smoker    Packs/day: 2.00    Years: 10.00    Pack years: 20.00    Types: Cigarettes    Last attempt to quit: 07/09/1989    Years since quitting: 28.8  . Smokeless tobacco: Never Used  Substance and Sexual Activity  . Alcohol use: No  . Drug use: No  . Sexual activity: Yes  Lifestyle  . Physical activity:    Days per week: Not on file    Minutes per session: Not on file  . Stress: Not on file  Relationships  . Social connections:    Talks on phone: Not on file    Gets together: Not on file    Attends religious service: Not on file    Active member of club or organization: Not on file    Attends meetings of clubs or organizations: Not on file    Relationship status: Not on file  . Intimate partner violence:    Fear of current or ex partner: Not on file    Emotionally abused: Not on file    Physically abused: Not on file    Forced sexual activity: Not on file  Other Topics Concern  . Not on file  Social History Narrative  . Not on file   Family History  Problem Relation Age of Onset  . Heart disease Mother   . Hypertension Father   . Breast cancer Sister   . Lung cancer Sister   . Skin cancer Brother    Vitals:   05/20/18 1025  BP: 128/74  Pulse: 87  SpO2: 98%  Weight: 68.9 kg (151 lb 12.8 oz)   Wt Readings from Last 3 Encounters:  05/20/18 68.9 kg (151 lb 12.8 oz)  05/06/18 68 kg (150 lb)  03/14/18 68 kg (150 lb)    PHYSICAL EXAM: General:  Well appearing. No resp  difficulty HEENT: normal Neck: supple. no JVD. Carotids 2+ bilat; no bruits. No lymphadenopathy or thryomegaly appreciated. Cor: PMI nondisplaced. Regular rate & rhythm. 2/6 SEM at RUSB  Lungs: clear Abdomen: soft, nontender, nondistended. No hepatosplenomegaly. No bruits or masses. Good bowel sounds. Extremities: no cyanosis, clubbing, rash, edema Neuro: alert & orientedx3, cranial nerves grossly intact. moves all 4 extremities w/o difficulty. Affect  pleasant  ASSESSMENT & PLAN:  1. Chronic systolic CHF: NICM, EF 01-09%, felt to be related to doxorubicin therapy. But also with frequent PVC's, holter monitor showed 1% PVC's but patient complained of very frequent PVC's.  - Echo 05/2017 ~30-35%. LV size - Echo 9/1/9 EF 45-50% - CPX 01/2018 Peak VO2: 21.5 (108% predicted peak VO2) VE/VCO2 slope: 48 Peak RER: 0.93 - Still NYHA II-III - Volume status looks good, - Continue lasix as needed.  - Continue Spiro 12.5 mg daily. Did not tolerate higher dose. - Continue losartan 25 mg bid - Stop digoxin.  - Continue corlanor 5 mg BID.  - Continue amiodarone 100 mg daily.  - Will try low-dose carvedilol 3.125 to try and wean corlanor and amio - Needs to build her stamina back. Not motivated to do Silver Sneakers yet   2. Palpitations - Holter monitor 03/2017 with PVC burden <1%. - Continue amiodarone 100 mg daily. No PVCs on ECG. Would like to wean off amio eventually. See above  3. Mitral Regurgitation - Mild on cMRI 07/07/2017. No change.    4. Pulmonary nodule - Stable on f/u CT 11/18. No change.   5. Chest Pain/pressure - Unclear etiology. 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  - Had CT chest 3/19 no PE  Glori Bickers, MD  10:46 AM

## 2018-05-20 NOTE — Patient Instructions (Signed)
Stop Digoxin   Start Carvedilol 3.125 mg Twice daily   Labs done today  Your physician recommends that you schedule a follow-up appointment in: 3 months

## 2018-05-21 ENCOUNTER — Ambulatory Visit (HOSPITAL_COMMUNITY): Payer: PPO

## 2018-05-23 ENCOUNTER — Ambulatory Visit (HOSPITAL_COMMUNITY): Payer: PPO

## 2018-05-26 ENCOUNTER — Ambulatory Visit (HOSPITAL_COMMUNITY): Payer: PPO

## 2018-05-28 ENCOUNTER — Ambulatory Visit (HOSPITAL_COMMUNITY): Payer: PPO

## 2018-05-30 ENCOUNTER — Ambulatory Visit (HOSPITAL_COMMUNITY): Payer: PPO

## 2018-06-02 ENCOUNTER — Ambulatory Visit (HOSPITAL_COMMUNITY): Payer: PPO

## 2018-06-03 NOTE — Addendum Note (Signed)
Encounter addended by: Conrad Clarks, NP on: 06/03/2018 2:14 PM  Actions taken: Sign clinical note

## 2018-06-04 ENCOUNTER — Ambulatory Visit (HOSPITAL_COMMUNITY): Payer: PPO

## 2018-06-06 ENCOUNTER — Ambulatory Visit (HOSPITAL_COMMUNITY): Payer: PPO

## 2018-06-09 ENCOUNTER — Ambulatory Visit (HOSPITAL_COMMUNITY): Payer: PPO

## 2018-06-10 DIAGNOSIS — H353131 Nonexudative age-related macular degeneration, bilateral, early dry stage: Secondary | ICD-10-CM | POA: Diagnosis not present

## 2018-06-11 ENCOUNTER — Ambulatory Visit (HOSPITAL_COMMUNITY): Payer: PPO

## 2018-06-13 ENCOUNTER — Ambulatory Visit (HOSPITAL_COMMUNITY): Payer: PPO

## 2018-06-16 ENCOUNTER — Ambulatory Visit (HOSPITAL_COMMUNITY): Payer: PPO

## 2018-06-18 ENCOUNTER — Ambulatory Visit (HOSPITAL_COMMUNITY): Payer: PPO

## 2018-06-20 ENCOUNTER — Ambulatory Visit (HOSPITAL_COMMUNITY): Payer: PPO

## 2018-06-23 ENCOUNTER — Ambulatory Visit (HOSPITAL_COMMUNITY): Payer: PPO

## 2018-06-25 ENCOUNTER — Ambulatory Visit (HOSPITAL_COMMUNITY): Payer: PPO

## 2018-06-27 ENCOUNTER — Ambulatory Visit (HOSPITAL_COMMUNITY): Payer: PPO

## 2018-06-30 ENCOUNTER — Ambulatory Visit (HOSPITAL_COMMUNITY): Payer: PPO

## 2018-07-03 MED FILL — LOSARTAN POTASSIUM 25 MG TA: 25 | 30 days supply | Qty: 60 | Fill #2

## 2018-07-04 ENCOUNTER — Ambulatory Visit (HOSPITAL_COMMUNITY): Payer: PPO

## 2018-07-07 ENCOUNTER — Ambulatory Visit (HOSPITAL_COMMUNITY): Payer: PPO

## 2018-07-11 ENCOUNTER — Ambulatory Visit (HOSPITAL_COMMUNITY): Payer: PPO

## 2018-07-14 ENCOUNTER — Ambulatory Visit (HOSPITAL_COMMUNITY): Payer: PPO

## 2018-07-15 DIAGNOSIS — G4733 Obstructive sleep apnea (adult) (pediatric): Secondary | ICD-10-CM | POA: Diagnosis not present

## 2018-07-16 ENCOUNTER — Ambulatory Visit (HOSPITAL_COMMUNITY): Payer: PPO

## 2018-07-18 ENCOUNTER — Ambulatory Visit (HOSPITAL_COMMUNITY): Payer: PPO

## 2018-07-21 DIAGNOSIS — L821 Other seborrheic keratosis: Secondary | ICD-10-CM | POA: Diagnosis not present

## 2018-07-21 DIAGNOSIS — L570 Actinic keratosis: Secondary | ICD-10-CM | POA: Diagnosis not present

## 2018-07-21 DIAGNOSIS — D225 Melanocytic nevi of trunk: Secondary | ICD-10-CM | POA: Diagnosis not present

## 2018-07-24 ENCOUNTER — Encounter: Payer: Self-pay | Admitting: Family Medicine

## 2018-07-24 ENCOUNTER — Other Ambulatory Visit: Payer: Self-pay

## 2018-07-24 ENCOUNTER — Ambulatory Visit (INDEPENDENT_AMBULATORY_CARE_PROVIDER_SITE_OTHER): Payer: PPO | Admitting: Family Medicine

## 2018-07-24 VITALS — BP 117/78 | HR 76 | Temp 98.1°F | Resp 17 | Ht 64.0 in | Wt 163.1 lb

## 2018-07-24 DIAGNOSIS — I5022 Chronic systolic (congestive) heart failure: Secondary | ICD-10-CM | POA: Diagnosis not present

## 2018-07-24 DIAGNOSIS — R631 Polydipsia: Secondary | ICD-10-CM | POA: Diagnosis not present

## 2018-07-24 DIAGNOSIS — R319 Hematuria, unspecified: Secondary | ICD-10-CM | POA: Diagnosis not present

## 2018-07-24 DIAGNOSIS — R5383 Other fatigue: Secondary | ICD-10-CM | POA: Diagnosis not present

## 2018-07-24 DIAGNOSIS — R350 Frequency of micturition: Secondary | ICD-10-CM

## 2018-07-24 DIAGNOSIS — R635 Abnormal weight gain: Secondary | ICD-10-CM | POA: Diagnosis not present

## 2018-07-24 LAB — CBC WITH DIFFERENTIAL/PLATELET
BASOS PCT: 0.8 % (ref 0.0–3.0)
Basophils Absolute: 0.1 10*3/uL (ref 0.0–0.1)
EOS ABS: 0.2 10*3/uL (ref 0.0–0.7)
EOS PCT: 3.6 % (ref 0.0–5.0)
HEMATOCRIT: 43.5 % (ref 36.0–46.0)
HEMOGLOBIN: 14.8 g/dL (ref 12.0–15.0)
LYMPHS PCT: 17.8 % (ref 12.0–46.0)
Lymphs Abs: 1.1 10*3/uL (ref 0.7–4.0)
MCHC: 33.9 g/dL (ref 30.0–36.0)
MCV: 92.2 fl (ref 78.0–100.0)
MONOS PCT: 9.6 % (ref 3.0–12.0)
Monocytes Absolute: 0.6 10*3/uL (ref 0.1–1.0)
NEUTROS ABS: 4.3 10*3/uL (ref 1.4–7.7)
Neutrophils Relative %: 68.2 % (ref 43.0–77.0)
PLATELETS: 228 10*3/uL (ref 150.0–400.0)
RBC: 4.72 Mil/uL (ref 3.87–5.11)
RDW: 14 % (ref 11.5–15.5)
WBC: 6.3 10*3/uL (ref 4.0–10.5)

## 2018-07-24 LAB — POCT URINALYSIS DIPSTICK
Bilirubin, UA: NEGATIVE
GLUCOSE UA: NEGATIVE
KETONES UA: NEGATIVE
Leukocytes, UA: NEGATIVE
Nitrite, UA: NEGATIVE
Protein, UA: NEGATIVE
Spec Grav, UA: 1.02 (ref 1.010–1.025)
Urobilinogen, UA: 0.2 E.U./dL
pH, UA: 5 (ref 5.0–8.0)

## 2018-07-24 LAB — TSH: TSH: 3.04 u[IU]/mL (ref 0.35–4.50)

## 2018-07-24 LAB — HEPATIC FUNCTION PANEL
ALK PHOS: 116 U/L (ref 39–117)
ALT: 15 U/L (ref 0–35)
AST: 17 U/L (ref 0–37)
Albumin: 4.3 g/dL (ref 3.5–5.2)
BILIRUBIN DIRECT: 0.1 mg/dL (ref 0.0–0.3)
BILIRUBIN TOTAL: 0.5 mg/dL (ref 0.2–1.2)
Total Protein: 6.7 g/dL (ref 6.0–8.3)

## 2018-07-24 LAB — LIPID PANEL
CHOL/HDL RATIO: 4
Cholesterol: 254 mg/dL — ABNORMAL HIGH (ref 0–200)
HDL: 62.5 mg/dL (ref >39.00)
LDL CALC: 162 mg/dL — AB (ref 0–99)
NONHDL: 191.12
TRIGLYCERIDES: 144 mg/dL (ref 0.0–149.0)
VLDL: 28.8 mg/dL (ref 0.0–40.0)

## 2018-07-24 LAB — BASIC METABOLIC PANEL
BUN: 17 mg/dL (ref 6–23)
CHLORIDE: 102 meq/L (ref 96–112)
CO2: 30 meq/L (ref 19–32)
CREATININE: 0.74 mg/dL (ref 0.40–1.20)
Calcium: 10.1 mg/dL (ref 8.4–10.5)
GFR: 77.57 mL/min (ref >60.00)
Glucose, Bld: 98 mg/dL (ref 70–99)
Potassium: 4.3 mEq/L (ref 3.5–5.1)
Sodium: 139 mEq/L (ref 135–145)

## 2018-07-24 LAB — HEMOGLOBIN A1C: Hgb A1c MFr Bld: 5.8 % (ref 4.6–6.5)

## 2018-07-24 MED ORDER — CEPHALEXIN 500 MG PO CAPS: 500.0000 mg | ORAL_CAPSULE | Freq: Two times a day (BID) | ORAL | 0 refills | Status: AC

## 2018-07-24 MED FILL — CEPHALEXIN 500 MG CAPSULE: 500 | 5 days supply | Qty: 10 | Fill #0

## 2018-07-24 NOTE — Progress Notes (Signed)
   Subjective:    Patient ID: Patricia Collier, female    DOB: 03/28/1946, 73 y.o.   MRN: [id number]  HPI 'I don't feel well'- doesn't feel it's heart related.  EF is up from 15%-->40%.  Following w/ Dr Haroldine Laws.  At last visit stopped Dig and started Carvedilol.  + fatigue.  Increased urinary frequency, suprapubic pressure, urgency 'for the last month at least'.  Increased thirst.  + weight gain.  No dysuria or change in odor.  Pt is walking for an hour- doesn't make her feel better or worse.  Currently on CPAP   Review of Systems For ROS see HPI     Objective:   Physical Exam Vitals signs reviewed.  Constitutional:      General: She is not in acute distress.    Appearance: She is well-developed.  HENT:     Head: Normocephalic and atraumatic.  Eyes:     Conjunctiva/sclera: Conjunctivae normal.     Pupils: Pupils are equal, round, and reactive to light.  Neck:     Musculoskeletal: Normal range of motion and neck supple.     Thyroid: No thyromegaly.  Cardiovascular:     Rate and Rhythm: Normal rate and regular rhythm.     Heart sounds: Normal heart sounds.  Pulmonary:     Effort: Pulmonary effort is normal. No respiratory distress.     Breath sounds: Normal breath sounds.  Abdominal:     General: There is no distension.     Palpations: Abdomen is soft.     Tenderness: There is abdominal tenderness (suprapubic TTP).  Lymphadenopathy:     Cervical: No cervical adenopathy.  Skin:    General: Skin is warm and dry.  Neurological:     Mental Status: She is alert and oriented to person, place, and time.  Psychiatric:        Behavior: Behavior normal.           Assessment & Plan:  Urinary frequency- new to provider, ongoing for pt x1 month.  No fevers, no back pain.  + suprapubic pressure.  RBC on UA.  Start Keflex.  Await culture results.  Fatigue- new.  Pt feels this coincides w/ starting Coreg but is out of proportion to low dose medication.  Check labs to r/o  metabolic cause.  Increased thirst/weight gain- new.  Pt is concerned about DM as mom had this.  Check labs to assess.  Will follow.

## 2018-07-24 NOTE — Patient Instructions (Signed)
Follow up as needed or as scheduled We'll notify you of your lab results and make any changes if needed START the Cephalexin as directed Make sure you are drinking enough fluids Continue to walk regularly Call with any questions or concerns Hang in there!!!

## 2018-07-25 LAB — URINE CULTURE
MICRO NUMBER:: 66354
RESULT: NO GROWTH
SPECIMEN QUALITY:: ADEQUATE

## 2018-07-29 ENCOUNTER — Encounter: Payer: Self-pay | Admitting: Family Medicine

## 2018-07-30 ENCOUNTER — Other Ambulatory Visit: Payer: Self-pay | Admitting: Family Medicine

## 2018-07-30 DIAGNOSIS — R102 Pelvic and perineal pain: Secondary | ICD-10-CM

## 2018-08-01 MED FILL — CORLANOR 5 MG TABLET: 5 | 90 days supply | Qty: 180 | Fill #2

## 2018-08-01 MED FILL — LOSARTAN POTASSIUM 25 MG TA: 25 | 30 days supply | Qty: 60 | Fill #3

## 2018-08-12 ENCOUNTER — Other Ambulatory Visit: Payer: Self-pay

## 2018-08-12 NOTE — Patient Outreach (Signed)
[place] [facility]) Care Management  08/12/2018  Patricia Collier [birth year] [id number]   Medication Adherence call to Patricia Collier left a message for patient to call back patient is due on Losartan 25 mg. on her med list doctor has a note not to fill this medication it has been change to other mg or other medication.   [facility] Management Direct Dial [phone]  Fax [phone] [email or web address]

## 2018-08-13 ENCOUNTER — Other Ambulatory Visit: Payer: Self-pay

## 2018-08-13 NOTE — Patient Outreach (Signed)
[place] [facility]) Care Management  08/13/2018  LEYLAH TARNOW 1945-07-26 [id number]   Medication Adherence call back from Mrs. Puneet Selden patient pick up Losartan 25 mg two weeks ago, patient does not have a problem taking this medication  and fills her pill box every 2 weeks and picks up every month.   [place] Management Direct Dial [phone]  Fax [phone] [email or web address]

## 2018-08-15 DIAGNOSIS — G4733 Obstructive sleep apnea (adult) (pediatric): Secondary | ICD-10-CM | POA: Diagnosis not present

## 2018-08-20 ENCOUNTER — Ambulatory Visit (HOSPITAL_COMMUNITY)
Admission: RE | Admit: 2018-08-20 | Discharge: 2018-08-20 | Disposition: A | Discharge status: Home / Self Care | Payer: PPO | Source: Ambulatory Visit | Attending: Internal Medicine | Admitting: Internal Medicine

## 2018-08-20 ENCOUNTER — Encounter (HOSPITAL_COMMUNITY): Payer: Self-pay | Admitting: Internal Medicine

## 2018-08-20 VITALS — BP 118/84 | HR 61 | Wt 162.6 lb

## 2018-08-20 DIAGNOSIS — Z885 Allergy status to narcotic agent status: Secondary | ICD-10-CM | POA: Insufficient documentation

## 2018-08-20 DIAGNOSIS — Z888 Allergy status to other drugs, medicaments and biological substances status: Secondary | ICD-10-CM | POA: Diagnosis not present

## 2018-08-20 DIAGNOSIS — R911 Solitary pulmonary nodule: Secondary | ICD-10-CM | POA: Diagnosis not present

## 2018-08-20 DIAGNOSIS — I340 Nonrheumatic mitral (valve) insufficiency: Secondary | ICD-10-CM | POA: Diagnosis not present

## 2018-08-20 DIAGNOSIS — Z801 Family history of malignant neoplasm of trachea, bronchus and lung: Secondary | ICD-10-CM | POA: Insufficient documentation

## 2018-08-20 DIAGNOSIS — I444 Left anterior fascicular block: Secondary | ICD-10-CM | POA: Insufficient documentation

## 2018-08-20 DIAGNOSIS — R079 Chest pain, unspecified: Secondary | ICD-10-CM | POA: Diagnosis not present

## 2018-08-20 DIAGNOSIS — I5023 Acute on chronic systolic (congestive) heart failure: Secondary | ICD-10-CM | POA: Insufficient documentation

## 2018-08-20 DIAGNOSIS — I2510 Atherosclerotic heart disease of native coronary artery without angina pectoris: Secondary | ICD-10-CM | POA: Diagnosis not present

## 2018-08-20 DIAGNOSIS — Z8249 Family history of ischemic heart disease and other diseases of the circulatory system: Secondary | ICD-10-CM | POA: Diagnosis not present

## 2018-08-20 DIAGNOSIS — I5022 Chronic systolic (congestive) heart failure: Secondary | ICD-10-CM | POA: Diagnosis not present

## 2018-08-20 DIAGNOSIS — R0989 Other specified symptoms and signs involving the circulatory and respiratory systems: Secondary | ICD-10-CM | POA: Diagnosis not present

## 2018-08-20 DIAGNOSIS — Z79899 Other long term (current) drug therapy: Secondary | ICD-10-CM | POA: Diagnosis not present

## 2018-08-20 DIAGNOSIS — Z803 Family history of malignant neoplasm of breast: Secondary | ICD-10-CM | POA: Insufficient documentation

## 2018-08-20 DIAGNOSIS — Z808 Family history of malignant neoplasm of other organs or systems: Secondary | ICD-10-CM | POA: Insufficient documentation

## 2018-08-20 DIAGNOSIS — Z853 Personal history of malignant neoplasm of breast: Secondary | ICD-10-CM | POA: Insufficient documentation

## 2018-08-20 DIAGNOSIS — I493 Ventricular premature depolarization: Secondary | ICD-10-CM | POA: Insufficient documentation

## 2018-08-20 DIAGNOSIS — R002 Palpitations: Secondary | ICD-10-CM | POA: Insufficient documentation

## 2018-08-20 DIAGNOSIS — Z87891 Personal history of nicotine dependence: Secondary | ICD-10-CM | POA: Insufficient documentation

## 2018-08-20 MED ORDER — LOSARTAN POTASSIUM 25 MG PO TABS: 25.0000 mg | ORAL_TABLET | Freq: Two times a day (BID) | ORAL | 3 refills | Status: DC

## 2018-08-20 MED ORDER — AMIODARONE HCL 100 MG PO TABS: 100.0000 mg | ORAL_TABLET | Freq: Every day | ORAL | 3 refills | Status: DC

## 2018-08-20 MED ORDER — IVABRADINE HCL 5 MG PO TABS: 2.5000 mg | ORAL_TABLET | Freq: Two times a day (BID) | ORAL | 3 refills | Status: DC

## 2018-08-20 MED FILL — CARVEDILOL 3.125 MG TABLET: 3.125 | 90 days supply | Qty: 180 | Fill #1

## 2018-08-20 MED FILL — SPIRONOLACTONE 25 MG TABLET: 25 | 90 days supply | Qty: 45 | Fill #1

## 2018-08-20 MED FILL — AMIODARONE HCL 100 MG TABS: 100 | 90 days supply | Qty: 90 | Fill #0

## 2018-08-20 NOTE — Progress Notes (Signed)
Advanced Heart Failure Clinic Note   Primary Cardiologist: Dr. 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   Admitted 02/04/17-02/07/17 with new onset acute systolic CHF. EF 15-20%. Right and left heart cath done and showed Fick output/index 3.1/1.8. No CAD. She was started on losartan, Spiro and digoxin. Discharge weight was 140 pounds.   Holter monitor 8/18: Multiform PVCs, couplets, and triplets were noted with periods of bigeminy. PVC burden < 1%.No VT observed.  Echo 9/19: EF 45-50%  She returns today for HF follow up. Last visit dig was stopped and she was started on low dose coreg. Overall doing okay from HF perspective. She was very fatigued x1 month. Has been struggling recently. Diagnosed with institial cystitis and sees urology soon. Also struggling with her daughter, who is a drug addict. She has been stress eating. She is walking 3-4 miles on flat ground slowly 5x/day week. She reinjured her knee trying to walk on the treadmill. No SOB except with incline. Has occ mild LE edema. Sleeps with 2 pillows at baseline. Wearing CPAP qHS for around 4 hours/night. Hates it. No CP or palpitations.No orthopnea or PND.  No dizziness. Weight is up 10 lbs. Has not needed lasix. Taking all medications.   CPX 01/2018 Peak VO2: 21.5 (108% predicted peak VO2) VE/VCO2 slope: 48 OUES: 1.84 Peak RER: 0.93  Echo 05/09/17 EF ~30% RV ok. Personally reviewed  Echo 02/05/17 LVEF 15-20%, Grade 1 DD, Mild/Mod central MR, Mild LAE  R/LHC 02/06/17  There is severe left ventricular systolic dysfunction.  Dist Cx lesion, 20 %stenosed.  1st Mrg lesion, 60 %stenosed.  Dist RCA  lesion, 20 %stenosed.  Mid LAD to Dist LAD lesion, 20 %stenosed.   Findings: Ao = 80/56 (68) LV = 86/14 RA = 2  RV = 22/2 PA = 24/11 (17) PCW = 6 Fick cardiac output/index = 3.1/1.8 PVR = 3.6 WU Ao sat = 94% PA sat = 58%, 58%  Cardiac MRI 06/2017 1.  Moderate LVE with diffuse hypokinesis EF 35% 2. Small area of apical hyperenhancement on delayed gadolinium images 3.  Mild MR 4.  Mild LAE 5. Thickened tri leaflet AV with restricted leaflet motion suggest echo for AS correlation 6.  Normal RV size and function 7.  Normal aortic root 3.0 cm  Review of systems complete and found to be negative unless listed in HPI.   Past Medical History:  Diagnosis Date  . Arthritis    ostearthritis -joints, greater left knee.  . Breast cancer, left breast ([place]) 2009   S/P surgery, radiation, chemotherapy.-no further oncology visits.  . CHF (congestive heart failure) ([place]) dx'd 02/05/2017  . Hyperlipidemia   . Neuromuscular disorder ([place])    neuropathy due to chemotherapy  . Personal history of chemotherapy 2009  . Personal history of radiation therapy 2009  . Seizures ([place])    "childbirth related"  . SVT (supraventricular tachycardia) (H[facility])    x1 episode -3 yrs ago- no issues since.All heart testing negative. No further cardiac visits needed.   Current Outpatient Medications  Medication Sig Dispense Refill  . amiodarone (PACERONE) 100 MG tablet Take 1 tablet (100 mg total) by mouth daily. 90 tablet 3  .  carvedilol (COREG) 3.125 MG tablet Take 1 tablet (3.125 mg total) by mouth 2 (two) times daily. 180 tablet 2  . Cholecalciferol (VITAMIN D-3) 1000 units CAPS Take 2 capsules by mouth daily.     . ivabradine (CORLANOR) 5 MG TABS tablet Take 1 tablet (5 mg total) by mouth 2 (two) times daily with a meal. 180 tablet 3  . losartan (COZAAR) 25 MG tablet Take 1 tablet (25 mg total) by mouth 2 (two) times daily. 60 tablet 3  . Magnesium 400 MG TABS Take 1 tablet by mouth daily.     Marland Kitchen  spironolactone (ALDACTONE) 25 MG tablet Take 0.5 tablets (12.5 mg total) by mouth daily. 45 tablet 3  . furosemide (LASIX) 20 MG tablet Take 20 mg by mouth daily as needed.     No current facility-administered medications for this encounter.    Allergies  Allergen Reactions  . Effexor [Venlafaxine] Other (See Comments)    Caused depression, negative thoughts, and crying   . Hydrocodone-Acetaminophen Itching and Nausea And Vomiting    Patient can tolerate with Zofran    Social History   Socioeconomic History  . Marital status: Married    Spouse name: Not on file  . Number of children: Not on file  . Years of education: Not on file  . Highest education level: Not on file  Occupational History  . Not on file  Social Needs  . Financial resource strain: Not on file  . Food insecurity:    Worry: Not on file    Inability: Not on file  . Transportation needs:    Medical: Not on file    Non-medical: Not on file  Tobacco Use  . Smoking status: Former Smoker    Packs/day: 2.00    Years: 10.00    Pack years: 20.00    Types: Cigarettes    Last attempt to quit: 07/09/1989    Years since quitting: 29.1  . Smokeless tobacco: Never Used  Substance and Sexual Activity  . Alcohol use: No  . Drug use: No  . Sexual activity: Yes  Lifestyle  . Physical activity:    Days per week: Not on file    Minutes per session: Not on file  . Stress: Not on file  Relationships  . Social connections:    Talks on phone: Not on file    Gets together: Not on file    Attends religious service: Not on file    Active member of club or organization: Not on file    Attends meetings of clubs or organizations: Not on file    Relationship status: Not on file  . Intimate partner violence:    Fear of current or ex partner: Not on file    Emotionally abused: Not on file    Physically abused: Not on file    Forced sexual activity: Not on file  Other Topics Concern  . Not on file  Social History Narrative    . Not on file   Family History  Problem Relation Age of Onset  . Heart disease Mother   . Hypertension Father   . Breast cancer Sister   . Lung cancer Sister   . Skin cancer Brother    Vitals:   08/20/18 1105  BP: 118/84  Pulse: 61  SpO2: 95%  Weight: 73.8 kg (162 lb 9.6 oz)   Wt Readings from Last 3 Encounters:  08/20/18 73.8 kg (162 lb 9.6 oz)  07/24/18 74 kg (163 lb 2  oz)  05/20/18 68.9 kg (151 lb 12.8 oz)    PHYSICAL EXAM: General: Well appearing. No resp difficulty. HEENT: Normal Neck: Supple. JVP flat. Carotids 2+ bilat; soft carotid bruit on right. No thyromegaly or nodule noted. Cor: PMI nondisplaced. RRR, 2/6 SEM at RUSB Lungs: CTAB, normal effort. Abdomen: Soft, non-tender, non-distended, no HSM. No bruits or masses. +BS  Extremities: No cyanosis, clubbing, or rash. R and LLE trace edema.  Neuro: Alert & orientedx3, cranial nerves grossly intact. moves all 4 extremities w/o difficulty. Affect pleasant  EKG: NSR 61 bpm, LAFB. No PVCs   ASSESSMENT & PLAN:  1. Chronic systolic CHF: NICM, EF 52-84%, felt to be related to doxorubicin therapy. But also with frequent PVC's, holter monitor showed 1% PVC's but patient complained of very frequent PVC's.  - Echo 05/2017 ~30-35%. LV size - Echo 03/09/78 EF 45-50% - CPX 01/2018 Peak VO2: 21.5 (108% predicted peak VO2) VE/VCO2 slope: 48 Peak RER: 0.93 - Improved NYHA II - Volume stable on exam despite weight gain. ReDS Clip 31% - Continue lasix 20 mg as needed. Has not needed recently.  - Continue Spiro 12.5 mg daily. Did not tolerate higher dose. Labs last month stable.  - Continue losartan 25 mg bid - Decrease corlanor to 2.5 mg BID x 2 weeks, then stop.  - Continue amiodarone 100 mg daily for now.  - Continue carvedilol 3.125 mg BID - She is walking 3-4 miles 5x/week on the indoor track at the [facility].  2. Palpitations - Holter monitor 03/2017 with PVC burden <1%. - Continue amiodarone 100 mg daily. TSH and LFTs  normal 07/24/18. EKG shows no PVCs today.   3. Mitral Regurgitation - Mild on cMRI 07/07/2017. No change.   4. Pulmonary nodule - Stable on f/u CT 11/18. No change.   5. Chest Pain/pressure - Unclear etiology. 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 Right carotid bruit - Check carotid US  Follow up in 4 months  Georgiana Shore, NP  11:45 AM   Patient seen and examined with the above-signed Advanced Practice Provider and/or Housestaff. I personally reviewed laboratory data, imaging studies and relevant notes. I independently examined the patient and formulated the important aspects of the plan. I have edited the note to reflect any of my changes or salient points. I have personally discussed the plan with the patient and/or family.  She is doing much better. EF improved. Volume status looks good on exam and by ReDS (31%) Will start to wean off corlanor. PVCs suppressed.  Continue low-dose amio for now. Can wean in the future. Continue exercise. Check u/s for right carotid bruit.   Glori Bickers, MD  2:53 PM

## 2018-08-20 NOTE — Progress Notes (Signed)
ReDS Vest - 08/20/18 1200      ReDS Vest   MR   Moderate  (Pended)     Estimated volume prior to reading  Low  (Pended)     Fitting Posture  Sitting  (Pended)     Height Marker  Short  (Pended)     Ruler Value  34  (Pended)     Center Strip  Aligned  (Pended)     ReDS Value  31  (Pended)

## 2018-08-20 NOTE — Patient Instructions (Signed)
A vest reading was completed to assess the amount of fluid in your chest.  DECREASE Corlanor to 2.5mg  TWICE A DAY for 2 weeks, then STOP taking.  You have been referred to [facility]ascular [facility] for a Carotid ultrasound, this office will call you to schedule an appointment.   Your physician recommends that you schedule a follow-up appointment in: 4 MONTHS.  Refills of Amiodarone and Losartan have been sent to your pharmacy.

## 2018-08-22 ENCOUNTER — Other Ambulatory Visit (HOSPITAL_COMMUNITY): Payer: Self-pay | Admitting: Internal Medicine

## 2018-08-22 DIAGNOSIS — R0989 Other specified symptoms and signs involving the circulatory and respiratory systems: Secondary | ICD-10-CM

## 2018-08-25 ENCOUNTER — Ambulatory Visit (HOSPITAL_COMMUNITY)
Admission: RE | Admit: 2018-08-25 | Discharge: 2018-08-25 | Disposition: A | Discharge status: Home / Self Care | Payer: PPO | Source: Ambulatory Visit | Attending: Cardiology | Admitting: Cardiology

## 2018-08-25 DIAGNOSIS — R0989 Other specified symptoms and signs involving the circulatory and respiratory systems: Secondary | ICD-10-CM | POA: Diagnosis not present

## 2018-08-25 MED FILL — LOSARTAN POTASSIUM 25 MG TA: 25 | 30 days supply | Qty: 60 | Fill #0

## 2018-08-26 ENCOUNTER — Telehealth (HOSPITAL_COMMUNITY): Payer: Self-pay

## 2018-08-26 NOTE — Telephone Encounter (Signed)
Gave pt results of carotid ultrasound. Pt verbalized understanding and is grateful for call.

## 2018-08-26 NOTE — Telephone Encounter (Signed)
-----   Message from Jolaine Artist, MD sent at 08/25/2018  5:31 PM EST ----- Mild plaque 1-39% bilaterally. Continue current therapy.

## 2018-09-13 DIAGNOSIS — G4733 Obstructive sleep apnea (adult) (pediatric): Secondary | ICD-10-CM | POA: Diagnosis not present

## 2018-09-18 DIAGNOSIS — N952 Postmenopausal atrophic vaginitis: Secondary | ICD-10-CM | POA: Diagnosis not present

## 2018-09-18 DIAGNOSIS — R3915 Urgency of urination: Secondary | ICD-10-CM | POA: Diagnosis not present

## 2018-09-18 DIAGNOSIS — R350 Frequency of micturition: Secondary | ICD-10-CM | POA: Diagnosis not present

## 2018-09-18 MED FILL — PREMARIN VAGINAL CREAM-APPL: 0.625 | 90 days supply | Qty: 30 | Fill #0

## 2018-09-30 MED FILL — LOSARTAN POTASSIUM 25 MG TA: 25 | 30 days supply | Qty: 60 | Fill #1 | Status: TO

## 2018-10-14 DIAGNOSIS — G4733 Obstructive sleep apnea (adult) (pediatric): Secondary | ICD-10-CM | POA: Diagnosis not present

## 2018-11-03 MED FILL — LOSARTAN POTASSIUM 25 MG TA: 25 | 30 days supply | Qty: 60 | Fill #0

## 2018-11-13 DIAGNOSIS — G4733 Obstructive sleep apnea (adult) (pediatric): Secondary | ICD-10-CM | POA: Diagnosis not present

## 2018-12-02 MED FILL — CARVEDILOL 3.125 MG TABLET: 3.125 | 90 days supply | Qty: 180 | Fill #0

## 2018-12-02 MED FILL — SPIRONOLACTONE 25 MG TABS: 25 | 90 days supply | Qty: 45 | Fill #0

## 2018-12-02 MED FILL — LOSARTAN POTASSIUM 25 MG TA: 25 | 30 days supply | Qty: 60 | Fill #1

## 2018-12-08 ENCOUNTER — Other Ambulatory Visit: Payer: Self-pay | Admitting: Family Medicine

## 2018-12-08 DIAGNOSIS — Z1231 Encounter for screening mammogram for malignant neoplasm of breast: Secondary | ICD-10-CM

## 2018-12-14 DIAGNOSIS — G4733 Obstructive sleep apnea (adult) (pediatric): Secondary | ICD-10-CM | POA: Diagnosis not present

## 2018-12-19 ENCOUNTER — Encounter (HOSPITAL_COMMUNITY): Payer: PPO | Admitting: Internal Medicine

## 2018-12-23 MED FILL — AMIODARONE HCL 100 MG TABS: 100 | 90 days supply | Qty: 90 | Fill #1

## 2018-12-30 ENCOUNTER — Other Ambulatory Visit (HOSPITAL_COMMUNITY): Payer: Self-pay | Admitting: Internal Medicine

## 2018-12-31 ENCOUNTER — Other Ambulatory Visit (HOSPITAL_COMMUNITY): Payer: Self-pay

## 2018-12-31 MED FILL — LOSARTAN POTASSIUM 25 MG TA: 25 | 30 days supply | Qty: 60 | Fill #0

## 2019-01-01 ENCOUNTER — Other Ambulatory Visit: Payer: Self-pay

## 2019-01-01 ENCOUNTER — Ambulatory Visit
Admission: RE | Admit: 2019-01-01 | Discharge: 2019-01-01 | Disposition: A | Discharge status: Home / Self Care | Payer: PPO | Source: Ambulatory Visit | Attending: Family Medicine | Admitting: Family Medicine

## 2019-01-01 DIAGNOSIS — Z1231 Encounter for screening mammogram for malignant neoplasm of breast: Secondary | ICD-10-CM

## 2019-01-13 DIAGNOSIS — G4733 Obstructive sleep apnea (adult) (pediatric): Secondary | ICD-10-CM | POA: Diagnosis not present

## 2019-01-26 DIAGNOSIS — L239 Allergic contact dermatitis, unspecified cause: Secondary | ICD-10-CM | POA: Diagnosis not present

## 2019-01-26 DIAGNOSIS — L650 Telogen effluvium: Secondary | ICD-10-CM | POA: Diagnosis not present

## 2019-01-26 DIAGNOSIS — L659 Nonscarring hair loss, unspecified: Secondary | ICD-10-CM | POA: Diagnosis not present

## 2019-02-02 MED FILL — LATISSE 0.03% EYELASH SOLN: 0.03 | 90 days supply | Qty: 5 | Fill #0

## 2019-02-02 MED FILL — LOSARTAN POTASSIUM 25 MG TA: 25 | 30 days supply | Qty: 60 | Fill #1

## 2019-02-13 DIAGNOSIS — G4733 Obstructive sleep apnea (adult) (pediatric): Secondary | ICD-10-CM | POA: Diagnosis not present

## 2019-02-17 ENCOUNTER — Other Ambulatory Visit: Payer: Self-pay

## 2019-02-17 DIAGNOSIS — Z20822 Contact with and (suspected) exposure to covid-19: Secondary | ICD-10-CM

## 2019-02-18 LAB — NOVEL CORONAVIRUS, NAA: SARS-CoV-2, NAA: NOT DETECTED

## 2019-03-16 DIAGNOSIS — G4733 Obstructive sleep apnea (adult) (pediatric): Secondary | ICD-10-CM | POA: Diagnosis not present

## 2019-03-19 ENCOUNTER — Telehealth: Payer: Self-pay | Admitting: Family Medicine

## 2019-03-19 ENCOUNTER — Other Ambulatory Visit (HOSPITAL_COMMUNITY): Payer: Self-pay | Admitting: Internal Medicine

## 2019-03-19 MED FILL — CARVEDILOL 3.125 MG TABLET: 3.125 | 90 days supply | Qty: 180 | Fill #0

## 2019-03-19 MED FILL — LOSARTAN POTASSIUM 25 MG TA: 25 | 30 days supply | Qty: 60 | Fill #0

## 2019-03-19 MED FILL — SPIRONOLACTONE 25 MG TABS: 25 | 90 days supply | Qty: 45 | Fill #1

## 2019-03-19 MED FILL — AMIODARONE HCL 100 MG TABS: 100 | 90 days supply | Qty: 90 | Fill #0

## 2019-03-19 NOTE — Telephone Encounter (Signed)
Pt called in asking if she needs to have the shingles shot done. Pt is aware that KT is out of the office this week.

## 2019-03-19 NOTE — Telephone Encounter (Signed)
Pt is listed as having [employer] insurance please advise?

## 2019-03-20 ENCOUNTER — Encounter: Payer: Self-pay | Admitting: Emergency Medicine

## 2019-03-20 NOTE — Telephone Encounter (Signed)
If patient has Pharmacist, community and over 60 should definitely be covered. If there are any concerns she can check with her insurance rep.

## 2019-03-20 NOTE — Telephone Encounter (Signed)
Mychart message sent to patient.

## 2019-04-15 DIAGNOSIS — G4733 Obstructive sleep apnea (adult) (pediatric): Secondary | ICD-10-CM | POA: Diagnosis not present

## 2019-04-22 ENCOUNTER — Ambulatory Visit (INDEPENDENT_AMBULATORY_CARE_PROVIDER_SITE_OTHER): Payer: PPO

## 2019-04-22 ENCOUNTER — Ambulatory Visit (HOSPITAL_COMMUNITY)
Admission: RE | Admit: 2019-04-22 | Discharge: 2019-04-22 | Disposition: A | Discharge status: Home / Self Care | Payer: PPO | Source: Ambulatory Visit | Attending: Internal Medicine | Admitting: Internal Medicine

## 2019-04-22 ENCOUNTER — Other Ambulatory Visit: Payer: Self-pay

## 2019-04-22 ENCOUNTER — Encounter (HOSPITAL_COMMUNITY): Payer: Self-pay | Admitting: Internal Medicine

## 2019-04-22 VITALS — BP 103/65 | HR 85 | Wt 145.6 lb

## 2019-04-22 DIAGNOSIS — Z888 Allergy status to other drugs, medicaments and biological substances status: Secondary | ICD-10-CM | POA: Diagnosis not present

## 2019-04-22 DIAGNOSIS — Z853 Personal history of malignant neoplasm of breast: Secondary | ICD-10-CM | POA: Diagnosis not present

## 2019-04-22 DIAGNOSIS — Z8249 Family history of ischemic heart disease and other diseases of the circulatory system: Secondary | ICD-10-CM | POA: Insufficient documentation

## 2019-04-22 DIAGNOSIS — R911 Solitary pulmonary nodule: Secondary | ICD-10-CM | POA: Insufficient documentation

## 2019-04-22 DIAGNOSIS — Z923 Personal history of irradiation: Secondary | ICD-10-CM | POA: Diagnosis not present

## 2019-04-22 DIAGNOSIS — Z79899 Other long term (current) drug therapy: Secondary | ICD-10-CM | POA: Insufficient documentation

## 2019-04-22 DIAGNOSIS — Z23 Encounter for immunization: Secondary | ICD-10-CM | POA: Diagnosis not present

## 2019-04-22 DIAGNOSIS — I428 Other cardiomyopathies: Secondary | ICD-10-CM | POA: Insufficient documentation

## 2019-04-22 DIAGNOSIS — I493 Ventricular premature depolarization: Secondary | ICD-10-CM | POA: Diagnosis not present

## 2019-04-22 DIAGNOSIS — Z9221 Personal history of antineoplastic chemotherapy: Secondary | ICD-10-CM | POA: Insufficient documentation

## 2019-04-22 DIAGNOSIS — Z885 Allergy status to narcotic agent status: Secondary | ICD-10-CM | POA: Insufficient documentation

## 2019-04-22 DIAGNOSIS — E785 Hyperlipidemia, unspecified: Secondary | ICD-10-CM | POA: Insufficient documentation

## 2019-04-22 DIAGNOSIS — Z803 Family history of malignant neoplasm of breast: Secondary | ICD-10-CM | POA: Insufficient documentation

## 2019-04-22 DIAGNOSIS — I340 Nonrheumatic mitral (valve) insufficiency: Secondary | ICD-10-CM | POA: Diagnosis not present

## 2019-04-22 DIAGNOSIS — Z87891 Personal history of nicotine dependence: Secondary | ICD-10-CM | POA: Diagnosis not present

## 2019-04-22 DIAGNOSIS — I5022 Chronic systolic (congestive) heart failure: Secondary | ICD-10-CM | POA: Insufficient documentation

## 2019-04-22 DIAGNOSIS — R002 Palpitations: Secondary | ICD-10-CM | POA: Diagnosis not present

## 2019-04-22 DIAGNOSIS — I350 Nonrheumatic aortic (valve) stenosis: Secondary | ICD-10-CM

## 2019-04-22 LAB — COMPREHENSIVE METABOLIC PANEL
ALT: 15 U/L (ref 0–44)
AST: 21 U/L (ref 15–41)
Albumin: 3.9 g/dL (ref 3.5–5.0)
Alkaline Phosphatase: 106 U/L (ref 38–126)
Anion gap: 11 (ref 5–15)
BUN: 21 mg/dL (ref 8–23)
CO2: 21 mmol/L — ABNORMAL LOW (ref 22–32)
Calcium: 9.5 mg/dL (ref 8.9–10.3)
Chloride: 106 mmol/L (ref 98–111)
Creatinine, Ser: 0.82 mg/dL (ref 0.44–1.00)
GFR calc Af Amer: >60 mL/min (ref >60)
GFR calc non Af Amer: >60 mL/min (ref >60)
Glucose, Bld: 100 mg/dL — ABNORMAL HIGH (ref 70–99)
Potassium: 4.5 mmol/L (ref 3.5–5.1)
Sodium: 138 mmol/L (ref 135–145)
Total Bilirubin: 0.6 mg/dL (ref 0.3–1.2)
Total Protein: 6.9 g/dL (ref 6.5–8.1)

## 2019-04-22 LAB — CBC
HCT: 42.6 % (ref 36.0–46.0)
Hemoglobin: 13.9 g/dL (ref 12.0–15.0)
MCH: 30.8 pg (ref 26.0–34.0)
MCHC: 32.6 g/dL (ref 30.0–36.0)
MCV: 94.5 fL (ref 80.0–100.0)
Platelets: 244 10*3/uL (ref 150–400)
RBC: 4.51 MIL/uL (ref 3.87–5.11)
RDW: 13.1 % (ref 11.5–15.5)
WBC: 5.6 10*3/uL (ref 4.0–10.5)
nRBC: 0 % (ref 0.0–0.2)

## 2019-04-22 LAB — T4, FREE: Free T4: 1.33 ng/dL — ABNORMAL HIGH (ref 0.61–1.12)

## 2019-04-22 LAB — TSH: TSH: 3.127 u[IU]/mL (ref 0.350–4.500)

## 2019-04-22 LAB — BRAIN NATRIURETIC PEPTIDE: B Natriuretic Peptide: 41 pg/mL (ref 0.0–100.0)

## 2019-04-22 NOTE — Patient Instructions (Addendum)
Labs done today. We will contact you only if your labs are abnormal.   STOP Ivabradine(Corlanor)  Your physician recommends that you schedule a follow-up appointment in: 6 months. We will contact you to schedule an appointment.   Your physician has requested that you have an echocardiogram Monday October 26,2020 at 9:15am. Echocardiography is a painless test that uses sound waves to create images of your heart. It provides your doctor with information about the size and shape of your heart and how well your heart's chambers and valves are working. This procedure takes approximately one hour. There are no restrictions for this procedure.  At the [facility], you and your health needs are our priority. As part of our continuing mission to provide you with exceptional heart care, we have created designated Provider Care Teams. These Care Teams include your primary Cardiologist (physician) and Advanced Practice Providers (APPs- Physician Assistants and Nurse Practitioners) who all work together to provide you with the care you need, when you need it.   You may see any of the following providers on your designated Care Team at your next follow up: Marland Kitchen Dr Glori Bickers . Dr Loralie Champagne . Darrick Grinder, NP . Lyda Jester, PA   Please be sure to bring in all your medications bottles to every appointment.

## 2019-04-22 NOTE — Progress Notes (Signed)
Advanced Heart Failure Clinic Note   Primary Cardiologist: Dr. 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 She has been very active and continues to work as Immunologist at [employer].   Admitted 02/04/17-02/07/17 with new onset acute systolic CHF. EF 15-20%. Right and left heart cath done and showed Fick output/index 3.1/1.8. No CAD. She was started on losartan, Spiro and digoxin. Discharge weight was 140 pounds.   Holter monitor 8/18: Multiform PVCs, couplets, and triplets were noted with periods of bigeminy. PVC burden < 1%.No VT observed.  Echo 9/19: EF 45-50% moderate AS mean gradient 82mmHG  She returns today for HF follow up. Doing great walking 3-5 miles per day. Lost 17 pounds. Denies CP or SOB. No orthopnea or PND. Took 1 lasix about 10 days for pressure in her chest or her throat. Wearing CPAP. Resting HR 70s. Goes to 90 quickly.   ReDS 28%   CPX 01/2018 Peak VO2: 21.5 (108% predicted peak VO2) VE/VCO2 slope: 48 OUES: 1.84 Peak RER: 0.93  Echo 05/09/17 EF ~30% RV ok. Personally reviewed  Echo 02/05/17 LVEF 15-20%, Grade 1 DD, Mild/Mod central MR, Mild LAE  R/LHC 02/06/17  There is severe left ventricular systolic dysfunction.  Dist Cx lesion, 20 %stenosed.  1st Mrg lesion, 60 %stenosed.  Dist RCA lesion, 20 %stenosed.  Mid LAD to Dist LAD lesion, 20 %stenosed.   Findings: Ao = 80/56 (68) LV = 86/14 RA = 2  RV = 22/2 PA = 24/11 (17) PCW = 6 Fick cardiac output/index = 3.1/1.8 PVR = 3.6 WU Ao sat = 94% PA sat = 58%, 58%  Cardiac MRI 06/2017 1.  Moderate LVE with diffuse hypokinesis EF 35% 2. Small area of apical hyperenhancement on delayed gadolinium images 3.  Mild MR 4.  Mild LAE 5. Thickened tri  leaflet AV with restricted leaflet motion suggest echo for AS correlation 6.  Normal RV size and function 7.  Normal aortic root 3.0 cm  Review of systems complete and found to be negative unless listed in HPI.   Past Medical History:  Diagnosis Date  . Arthritis    ostearthritis -joints, greater left knee.  . Breast cancer, left breast ([place]) 2009   S/P surgery, radiation, chemotherapy.-no further oncology visits.  . CHF (congestive heart failure) ([place]) dx'd 02/05/2017  . Hyperlipidemia   . Neuromuscular disorder ([place])    neuropathy due to chemotherapy  . Personal history of chemotherapy 2009  . Personal history of radiation therapy 2009  . Seizures ([place])    "childbirth related"  . SVT (supraventricular tachycardia) (H[facility])    x1 episode -3 yrs ago- no issues since.All heart testing negative. No further cardiac visits needed.   Current Outpatient Medications  Medication Sig Dispense Refill  . amiodarone (PACERONE) 100 MG tablet Take 1 tablet (100 mg total) by mouth daily. 90 tablet 3  . Biotin (BIOTIN 5000) 5 MG CAPS Take by mouth daily.    . carvedilol (COREG) 3.125 MG tablet TAKE 1 TABLET (3.125 MG TOTAL) BY MOUTH 2 TIMES DAILY. 180 tablet 0  . Cholecalciferol (VITAMIN D-3) 1000 units CAPS Take 2 capsules by mouth daily.     . furosemide (LASIX) 20 MG tablet Take 20 mg by mouth daily as needed.    Marland Kitchen  ivabradine (CORLANOR) 5 MG TABS tablet Take 0.5 tablets (2.5 mg total) by mouth 2 (two) times daily with a meal. 180 tablet 3  . losartan (COZAAR) 25 MG tablet TAKE 1 TABLET (25 MG TOTAL) BY MOUTH 2 (TWO) TIMES DAILY. 60 tablet 1  . Magnesium 400 MG TABS Take 1 tablet by mouth daily.     Marland Kitchen spironolactone (ALDACTONE) 25 MG tablet Take 0.5 tablets (12.5 mg total) by mouth daily. 45 tablet 3   No current facility-administered medications for this encounter.    Allergies  Allergen Reactions  . Effexor [Venlafaxine] Other (See Comments)    Caused depression, negative thoughts, and crying    . Hydrocodone-Acetaminophen Itching and Nausea And Vomiting    Patient can tolerate with Zofran    Social History   Socioeconomic History  . Marital status: Married    Spouse name: Not on file  . Number of children: Not on file  . Years of education: Not on file  . Highest education level: Not on file  Occupational History  . Not on file  Social Needs  . Financial resource strain: Not on file  . Food insecurity    Worry: Not on file    Inability: Not on file  . Transportation needs    Medical: Not on file    Non-medical: Not on file  Tobacco Use  . Smoking status: Former Smoker    Packs/day: 2.00    Years: 10.00    Pack years: 20.00    Types: Cigarettes    Quit date: 07/09/1989    Years since quitting: 29.8  . Smokeless tobacco: Never Used  Substance and Sexual Activity  . Alcohol use: No  . Drug use: No  . Sexual activity: Yes  Lifestyle  . Physical activity    Days per week: Not on file    Minutes per session: Not on file  . Stress: Not on file  Relationships  . Social Herbalist on phone: Not on file    Gets together: Not on file    Attends religious service: Not on file    Active member of club or organization: Not on file    Attends meetings of clubs or organizations: Not on file    Relationship status: Not on file  . Intimate partner violence    Fear of current or ex partner: Not on file    Emotionally abused: Not on file    Physically abused: Not on file    Forced sexual activity: Not on file  Other Topics Concern  . Not on file  Social History Narrative  . Not on file   Family History  Problem Relation Age of Onset  . Heart disease Mother   . Hypertension Father   . Breast cancer Sister   . Lung cancer Sister   . Skin cancer Brother    Vitals:   04/22/19 1146  BP: 103/65  Pulse: 85  SpO2: 95%  Weight: 66 kg (145 lb 9.6 oz)   Wt Readings from Last 3 Encounters:  04/22/19 66 kg (145 lb 9.6 oz)  08/20/18 73.8 kg (162 lb 9.6 oz)   07/24/18 74 kg (163 lb 2 oz)    PHYSICAL EXAM: General:  Well appearing. No resp difficulty HEENT: normal Neck: supple. no JVD. Carotids 2+ bilat; no bruits. No lymphadenopathy or thryomegaly appreciated. Cor: PMI nondisplaced. Regular rate & rhythm. No rubs, gallops or murmurs. Lungs: clear Abdomen: soft, nontender, nondistended. No hepatosplenomegaly. No bruits  or masses. Good bowel sounds. Extremities: no cyanosis, clubbing, rash, edema Neuro: alert & orientedx3, cranial nerves grossly intact. moves all 4 extremities w/o difficulty. Affect pleasant  ECG: NSR 73 LAFB no PVCs. No ST-T wave abnormalities.  Personally reviewed   ASSESSMENT & PLAN:  1. Chronic systolic CHF: NICM, EF 0000000, felt to be related to doxorubicin therapy. But also with frequent PVC's, holter monitor showed 1% PVC's but patient complained of very frequent PVC's.  - Echo 05/2017 ~30-35%. LV size - Echo 03/09/18 EF 45-50% - CPX 01/2018 Peak VO2: 21.5 (108% predicted peak VO2) VE/VCO2 slope: 48 Peak RER: 0.93 - Improved NYHA I-II - Volume looks good. ReDS Clip 28% - Continue lasix 20 mg as needed. Took one last week.  - Continue Spiro 12.5 mg daily. Did not tolerate higher dose. - Continue losartan 25 mg bid - Stop ivabradine for now. Watch resting HR. If goes > 90 will restart.  - Continue amiodarone 100 mg daily for now.  - Continue carvedilol 3.125 mg BID  2. Palpitations - Holter monitor 03/2017 with PVC burden <1%. - Continue amiodarone 100 mg daily. Recheck CMET and TFTs.   3. Mitral Regurgitation - Mild on cMRI 07/07/2017. No change.   4. Pulmonary nodule - Stable on f/u CT 11/18. No change.   5. Aortic stenosis - moderate by echo in 9/19. Will repeat echo    Glori Bickers, MD  12:38 PM

## 2019-04-23 DIAGNOSIS — M545 Low back pain: Secondary | ICD-10-CM | POA: Diagnosis not present

## 2019-04-23 LAB — T3, FREE: T3, Free: 2.2 pg/mL (ref 2.0–4.4)

## 2019-04-27 MED FILL — LOSARTAN POTASSIUM 25 MG TA: 25 | 30 days supply | Qty: 60 | Fill #1

## 2019-05-01 DIAGNOSIS — G4733 Obstructive sleep apnea (adult) (pediatric): Secondary | ICD-10-CM | POA: Diagnosis not present

## 2019-05-04 ENCOUNTER — Other Ambulatory Visit: Payer: Self-pay

## 2019-05-04 ENCOUNTER — Ambulatory Visit (HOSPITAL_COMMUNITY)
Admission: RE | Admit: 2019-05-04 | Discharge: 2019-05-04 | Disposition: A | Discharge status: Home / Self Care | Payer: PPO | Source: Ambulatory Visit | Attending: Internal Medicine | Admitting: Internal Medicine

## 2019-05-04 DIAGNOSIS — I083 Combined rheumatic disorders of mitral, aortic and tricuspid valves: Secondary | ICD-10-CM | POA: Diagnosis not present

## 2019-05-04 DIAGNOSIS — I5022 Chronic systolic (congestive) heart failure: Secondary | ICD-10-CM | POA: Insufficient documentation

## 2019-05-04 DIAGNOSIS — E785 Hyperlipidemia, unspecified: Secondary | ICD-10-CM | POA: Diagnosis not present

## 2019-05-04 NOTE — Progress Notes (Signed)
Echocardiogram 2D Echocardiogram has been performed.  Patricia Collier Deverick Pruss 05/04/2019, 10:19 AM

## 2019-05-16 DIAGNOSIS — G4733 Obstructive sleep apnea (adult) (pediatric): Secondary | ICD-10-CM | POA: Diagnosis not present

## 2019-05-25 ENCOUNTER — Other Ambulatory Visit (HOSPITAL_COMMUNITY): Payer: Self-pay | Admitting: Internal Medicine

## 2019-05-25 MED FILL — LOSARTAN POTASSIUM 25 MG TA: 25 | 30 days supply | Qty: 60 | Fill #0

## 2019-05-27 ENCOUNTER — Ambulatory Visit (INDEPENDENT_AMBULATORY_CARE_PROVIDER_SITE_OTHER): Payer: PPO

## 2019-05-27 ENCOUNTER — Other Ambulatory Visit: Payer: Self-pay

## 2019-05-27 ENCOUNTER — Encounter: Payer: Self-pay | Admitting: Family Medicine

## 2019-05-27 ENCOUNTER — Ambulatory Visit (INDEPENDENT_AMBULATORY_CARE_PROVIDER_SITE_OTHER): Payer: PPO | Admitting: Family Medicine

## 2019-05-27 VITALS — BP 122/68 | Temp 97.9°F | Ht 63.5 in | Wt 144.6 lb

## 2019-05-27 VITALS — BP 122/68 | Temp 98.7°F | Ht 63.5 in | Wt 144.6 lb

## 2019-05-27 DIAGNOSIS — Z0000 Encounter for general adult medical examination without abnormal findings: Secondary | ICD-10-CM

## 2019-05-27 DIAGNOSIS — E785 Hyperlipidemia, unspecified: Secondary | ICD-10-CM | POA: Diagnosis not present

## 2019-05-27 LAB — LIPID PANEL
Cholesterol: 279 mg/dL — ABNORMAL HIGH (ref 0–200)
HDL: 61.6 mg/dL (ref >39.00)
NonHDL: 217.47
Total CHOL/HDL Ratio: 5
Triglycerides: 248 mg/dL — ABNORMAL HIGH (ref 0.0–149.0)
VLDL: 49.6 mg/dL — ABNORMAL HIGH (ref 0.0–40.0)

## 2019-05-27 LAB — HEPATIC FUNCTION PANEL
ALT: 14 U/L (ref 0–35)
AST: 20 U/L (ref 0–37)
Albumin: 4.6 g/dL (ref 3.5–5.2)
Alkaline Phosphatase: 122 U/L — ABNORMAL HIGH (ref 39–117)
Bilirubin, Direct: 0.1 mg/dL (ref 0.0–0.3)
Total Bilirubin: 0.5 mg/dL (ref 0.2–1.2)
Total Protein: 7 g/dL (ref 6.0–8.3)

## 2019-05-27 LAB — CBC WITH DIFFERENTIAL/PLATELET
Basophils Absolute: 0.1 10*3/uL (ref 0.0–0.1)
Basophils Relative: 0.8 % (ref 0.0–3.0)
Eosinophils Absolute: 0.2 10*3/uL (ref 0.0–0.7)
Eosinophils Relative: 3.3 % (ref 0.0–5.0)
HCT: 42.7 % (ref 36.0–46.0)
Hemoglobin: 14.3 g/dL (ref 12.0–15.0)
Lymphocytes Relative: 18.5 % (ref 12.0–46.0)
Lymphs Abs: 1.1 10*3/uL (ref 0.7–4.0)
MCHC: 33.4 g/dL (ref 30.0–36.0)
MCV: 92.8 fl (ref 78.0–100.0)
Monocytes Absolute: 0.5 10*3/uL (ref 0.1–1.0)
Monocytes Relative: 7.4 % (ref 3.0–12.0)
Neutro Abs: 4.3 10*3/uL (ref 1.4–7.7)
Neutrophils Relative %: 70 % (ref 43.0–77.0)
Platelets: 257 10*3/uL (ref 150.0–400.0)
RBC: 4.6 Mil/uL (ref 3.87–5.11)
RDW: 14.4 % (ref 11.5–15.5)
WBC: 6.1 10*3/uL (ref 4.0–10.5)

## 2019-05-27 LAB — BASIC METABOLIC PANEL
BUN: 19 mg/dL (ref 6–23)
CO2: 26 mEq/L (ref 19–32)
Calcium: 9.4 mg/dL (ref 8.4–10.5)
Chloride: 102 mEq/L (ref 96–112)
Creatinine, Ser: 0.77 mg/dL (ref 0.40–1.20)
GFR: 73.91 mL/min (ref >60.00)
Glucose, Bld: 85 mg/dL (ref 70–99)
Potassium: 4.6 mEq/L (ref 3.5–5.1)
Sodium: 136 mEq/L (ref 135–145)

## 2019-05-27 LAB — TSH: TSH: 2.62 u[IU]/mL (ref 0.35–4.50)

## 2019-05-27 LAB — LDL CHOLESTEROL, DIRECT: Direct LDL: 175 mg/dL

## 2019-05-27 NOTE — Progress Notes (Signed)
   Subjective:    Patient ID: Patricia Collier, female    DOB: 05/07/1946, 73 y.o.   MRN: [id number]  HPI CPE- UTD on mammo, colonoscopy, flu shot.  Had wellness visit today.  Walking up to 5 miles daily.  Pt is down 20 lbs since last  visit   Review of Systems Patient reports no vision/ hearing changes, adenopathy,fever, persistant/recurrent hoarseness , swallowing issues, chest pain, palpitations, edema, persistant/recurrent cough, hemoptysis, dyspnea (rest/exertional/paroxysmal nocturnal), gastrointestinal bleeding (melena, rectal bleeding), abdominal pain, significant heartburn, bowel changes, GU symptoms (dysuria, hematuria, incontinence), Gyn symptoms (abnormal  bleeding, pain),  syncope, focal weakness, memory loss, numbness & tingling, skin/hair/nail changes, abnormal bruising or bleeding.   Anxiety/depression- adult daughter struggles w/ drugs and is back on the street.  Pt has had to cut her off.  Husband's health is failing.    Objective:   Physical Exam General Appearance:    Alert, cooperative, no distress, appears stated age  Head:    Normocephalic, without obvious abnormality, atraumatic  Eyes:    PERRL, conjunctiva/corneas clear, EOM's intact, fundi    benign, both eyes  Ears:    Normal TM's and external ear canals, both ears  Nose:   Deferred due to COVID  Throat:   Neck:   Supple, symmetrical, trachea midline, no adenopathy;    Thyroid: no enlargement/tenderness/nodules  Back:     Symmetric, no curvature, ROM normal, no CVA tenderness  Lungs:     Clear to auscultation bilaterally, respirations unlabored  Chest Wall:    No tenderness or deformity   Heart:    Regular rate and rhythm, S1 and S2 normal, no murmur, rub   or gallop  Breast Exam:    Deferred to mammo  Abdomen:     Soft, non-tender, bowel sounds active all four quadrants,    no masses, no organomegaly  Genitalia:    Deferred  Rectal:    Extremities:   Extremities normal, atraumatic, no cyanosis or edema   Pulses:   2+ and symmetric all extremities  Skin:   Skin color, texture, turgor normal, no rashes or lesions  Lymph nodes:   Cervical, supraclavicular, and axillary nodes normal  Neurologic:   CNII-XII intact, normal strength, sensation and reflexes    throughout          Assessment & Plan:

## 2019-05-27 NOTE — Assessment & Plan Note (Signed)
Pt's PE WNL.  UTD on mammo, colonoscopy, flu.  Check labs.  Anticipatory guidance provided.

## 2019-05-27 NOTE — Progress Notes (Signed)
Subjective:   Patricia Collier is a 73 y.o. female who presents for an Initial Medicare Annual Wellness Visit.  Review of Systems     Cardiac Risk Factors include: advanced age (>96men, >42 women);dyslipidemia    Objective:    Today's Vitals   05/27/19 1239  BP: 122/68  Temp: 98.7 F (37.1 C)  TempSrc: Temporal  Weight: 144 lb 9.6 oz (65.6 kg)  Height: 5' 3.5" (1.613 m)   Body mass index is 25.21 kg/m.  Advanced Directives 05/27/2019 05/06/2018 03/14/2018 02/05/2017 02/04/2017 09/21/2015 09/14/2015  Does Patient Have a Medical Advance Directive? Yes Yes Yes Yes Yes - Yes  Type of Advance Directive Living will;Healthcare Power of [place];Living will [place];Living will [place];Living will [place];Living will - Living will;Healthcare Power of Attorney  Does patient want to make changes to medical advance directive? No - Patient declined No - Patient declined No - Patient declined No - Patient declined - - No - Patient declined  Copy of [place] in Chart? No - copy requested No - copy requested No - copy requested No - copy requested - (No Data) No - copy requested    Current Medications (verified) Outpatient Encounter Medications as of 05/27/2019  Medication Sig  . amiodarone (PACERONE) 100 MG tablet Take 1 tablet (100 mg total) by mouth daily.  . Biotin (BIOTIN 5000) 5 MG CAPS Take by mouth daily.  . Cholecalciferol (VITAMIN D-3) 1000 units CAPS Take 2 capsules by mouth daily.   . furosemide (LASIX) 20 MG tablet Take 20 mg by mouth daily as needed.  Marland Kitchen losartan (COZAAR) 25 MG tablet TAKE 1 TABLET (25 MG TOTAL) BY MOUTH 2 (TWO) TIMES DAILY.  . Magnesium 400 MG TABS Take 1 tablet by mouth daily.   Marland Kitchen spironolactone (ALDACTONE) 25 MG tablet Take 0.5 tablets (12.5 mg total) by mouth daily.  . carvedilol (COREG) 3.125 MG tablet TAKE 1 TABLET (3.125 MG TOTAL) BY MOUTH 2 TIMES  DAILY.   No facility-administered encounter medications on file as of 05/27/2019.     Allergies (verified) Effexor [venlafaxine] and Hydrocodone-acetaminophen   History: Past Medical History:  Diagnosis Date  . Arthritis    ostearthritis -joints, greater left knee.  . Breast cancer, left breast ([place]) 2009   S/P surgery, radiation, chemotherapy.-no further oncology visits.  . CHF (congestive heart failure) ([place]) dx'd 02/05/2017  . Hyperlipidemia   . Neuromuscular disorder ([place])    neuropathy due to chemotherapy  . Personal history of chemotherapy 2009  . Personal history of radiation therapy 2009  . Seizures ([place])    "childbirth related"  . SVT (supraventricular tachycardia) ([facility])    x1 episode -3 yrs ago- no issues since.All heart testing negative. No further cardiac visits needed.   Past Surgical History:  Procedure Laterality Date  . ABDOMINAL HYSTERECTOMY  1984  . BILATERAL SALPINGOOPHORECTOMY Bilateral 2000s  . BREAST BIOPSY Left 2009  . BREAST LUMPECTOMY Left 2009   sentinel node dissection  . BUNIONECTOMY Bilateral early 2000s  . CARDIAC CATHETERIZATION  2015  . DIAGNOSTIC LAPAROSCOPY  1990s-early 2000s   multiple for adhesions  . DILATION AND CURETTAGE OF UTERUS     w/laparoscopies  . HAMMER TOE SURGERY Right 09/2015; 06/22/2016; 12/28/2016   2nd digit  . KNEE ARTHROSCOPY Left 09/21/2015   Procedure: LEFT ARTHROSCOPY KNEE WITH LATERAL MENISCUS DEBRIDEMENT;  Surgeon: Gaynelle Arabian, MD;  Location: [facility];  Service: Orthopedics;  Laterality: Left;  . LAPAROSCOPIC CHOLECYSTECTOMY    . RHINOPLASTY  1979  . RIGHT/LEFT HEART CATH AND CORONARY ANGIOGRAPHY N/A 02/06/2017   Procedure: Right/Left Heart Cath and Coronary Angiography;  Surgeon: Jolaine Artist, MD;  Location: [facility];  Service: Cardiovascular;  Laterality: N/A;  . TONSILLECTOMY AND ADENOIDECTOMY  1955  . TRANSANAL RECTOPEXY  1990s   Family History  Problem Relation Age of Onset  . Heart disease  Mother   . Hypertension Father   . Breast cancer Sister   . Lung cancer Sister   . Skin cancer Brother    Social History   Socioeconomic History  . Marital status: Married    Spouse name: Not on file  . Number of children: 2  . Years of education: Not on file  . Highest education level: Not on file  Occupational History  . Occupation: Retail banker    Employer: [employer]: retired June 2019  Social Needs  . Financial resource strain: Not on file  . Food insecurity    Worry: Not on file    Inability: Not on file  . Transportation needs    Medical: Not on file    Non-medical: Not on file  Tobacco Use  . Smoking status: Former Smoker    Packs/day: 2.00    Years: 10.00    Pack years: 20.00    Types: Cigarettes    Quit date: 07/09/1989    Years since quitting: 29.9  . Smokeless tobacco: Never Used  Substance and Sexual Activity  . Alcohol use: No  . Drug use: No  . Sexual activity: Yes  Lifestyle  . Physical activity    Days per week: 6 days    Minutes per session: 60 min  . Stress: Rather much  Relationships  . Social Herbalist on phone: Not on file    Gets together: Not on file    Attends religious service: Not on file    Active member of club or organization: Not on file    Attends meetings of clubs or organizations: Not on file    Relationship status: Not on file  Other Topics Concern  . Not on file  Social History Narrative   Patient with caregiver stress due to spouses declining health   Some conflicts with children and addictions (coping well)     Tobacco Counseling Counseling given: Not Answered   Clinical Intake:  Pre-visit preparation completed: Yes  Pain : No/denies pain  Diabetes: No  How often do you need to have someone help you when you read instructions, pamphlets, or other written materials from your doctor or pharmacy?: 1 - Never  Interpreter Needed?: No  Information entered by :: Denman George LPN    Activities of Daily Living In your present state of health, do you have any difficulty performing the following activities: 05/27/2019 07/24/2018  Hearing? N N  Vision? N N  Difficulty concentrating or making decisions? N N  Walking or climbing stairs? N N  Dressing or bathing? N N  Doing errands, shopping? N N  Preparing Food and eating ? N -  Using the Toilet? N -  In the past six months, have you accidently leaked urine? N -  Do you have problems with loss of bowel control? N -  Managing your Medications? N -  Managing your Finances? N -  Housekeeping or managing your Housekeeping? N -  Some recent data might be hidden  Immunizations and Health Maintenance Immunization History  Administered Date(s) Administered  . Fluad Quad(high Dose 65+) 04/22/2019  . Influenza Inj Mdck Quad With Preservative 04/23/2018  . Influenza Split 05/09/2013  . Influenza-Unspecified 03/29/2017  . Pneumococcal Conjugate-13 01/03/2017   There are no preventive care reminders to display for this patient.  Patient Care Team: [name], MD as PCP - General (Family Medicine) Maisie Fus, MD as Consulting Physician (Obstetrics and Gynecology) Haroldine Laws, Shaune Pascal, MD as Consulting Physician (Cardiology) Ulla Gallo, MD as Consulting Physician (Dermatology)  Indicate any recent Medical Services you may have received from other than [facility] providers in the past year (date may be approximate).     Assessment:   This is a routine wellness examination for [facility].  Hearing/Vision screen No exam data present  Dietary issues and exercise activities discussed: Current Exercise Habits: Home exercise routine, Type of exercise: walking;Other - see comments(golfing), Time (Minutes): 60, Frequency (Times/Week): 6, Weekly Exercise (Minutes/Week): 360, Intensity: Moderate  Goals   None    Depression Screen PHQ 2/9 Scores 05/27/2019 07/24/2018 01/03/2017 11/16/2013  PHQ - 2 Score 0 0 0 0   PHQ- 9 Score - 0 0 -    Fall Risk Fall Risk  05/27/2019 07/24/2018 01/03/2017 11/16/2013  Falls in the past year? 0 0 No No  Injury with Fall? 0 - - -  Follow up Falls evaluation completed;Education provided;Falls prevention discussed - - -    Is the patient's home free of loose throw rugs in walkways, pet beds, electrical cords, etc?   yes      Grab bars in the bathroom? yes      Handrails on the stairs?   yes      Adequate lighting?   yes  Timed Get Up and Go Performed completed and within normal timeframe; no gait abnormalities noted   Cognitive Function: no cognitive concerns at this time Cognitive Testing  Alert? Yes         Normal Appearance? Yes  Oriented to person? Yes           Place? Yes  Time? Yes  Recall of three objects? Yes  Can perform simple calculations? Yes  Displays appropriate judgment? Yes  Can read the correct time from a watch face? Yes   Screening Tests Health Maintenance  Topic Date Due  . TETANUS/TDAP  07/25/2019 (Originally 06/23/1965)  . PNA vac Low Risk Adult (2 of 2 - PPSV23) 07/25/2019 (Originally 01/03/2018)  . Hepatitis C Screening  06/07/2020 (Originally Oct 12, 1945)  . MAMMOGRAM  12/31/2020  . COLONOSCOPY  07/17/2021  . INFLUENZA VACCINE  Completed  . DEXA SCAN  Completed    Qualifies for Shingles Vaccine? Discussed and patient will check with pharmacy for coverage.  Patient education handout provided   Cancer Screenings: Lung: Low Dose CT Chest recommended if Age 42-80 years, 30 pack-year currently smoking OR have quit w/in 15years. Patient does not qualify. Breast: Up to date on Mammogram? Yes   Up to date of Bone Density/Dexa? Will request last records from Dr. Nori Riis  Colorectal: colonoscopy 07/18/11 with Dr. Watt Climes; repeat in 10 years   Additional Screenings:  Hepatitis C Screening: discussed; will check to see if this may have done through employee health      Plan:  I have personally reviewed and addressed the Medicare Annual  Wellness questionnaire and have noted the following in the patient's chart:  A. Medical and social history B. Use of alcohol, tobacco or illicit drugs  C. Current medications and supplements D. Functional ability and status E.  Nutritional status F.  Physical activity G. Advance directives H. List of other physicians I.  Hospitalizations, surgeries, and ER visits in previous 12 months J.  [place] such as hearing and vision if needed, cognitive and depression L. Referrals, records requested, and appointments- requesting records for immunizations from employee health and dexa results from Dr. Nori Riis   In addition, I have reviewed and discussed with patient certain preventive protocols, quality metrics, and best practice recommendations. A written personalized care plan for preventive services as well as general preventive health recommendations were provided to patient.   Signed,  Denman George, LPN  Nurse Health Advisor   Nurse Notes: no additional    Reviewed documentation provided above and agree.  Annye Asa, MD

## 2019-05-27 NOTE — Assessment & Plan Note (Signed)
Chronic problem.  She has refused a statin in the past.  Has lost ~20 lbs since last visit.  Exercising regularly.  Check labs to risk stratify.

## 2019-05-27 NOTE — Patient Instructions (Signed)
Follow up in 6 months to recheck BP and cholesterol We'll notify you of your lab results and make any changes if needed Keep up the good work!  You look great!!! Call with any questions or concerns Stay Safe!  Stay Healthy! Happy Holidays!!! 

## 2019-05-27 NOTE — Patient Instructions (Addendum)
Patricia Collier , Thank you for taking time to come for your Medicare Wellness Visit. I appreciate your ongoing commitment to your health goals. Please review the following plan we discussed and let me know if I can assist you in the future.   Screening recommendations/referrals: Colorectal Screening: up to date; last 07/18/11 with Dr. Watt Climes Mammogram: up to date; last 01/01/19 Bone Density: we will request records to see when your last dexa was   Vision and Dental Exams: Recommended annual ophthalmology exams for early detection of glaucoma and other disorders of the eye Recommended annual dental exams for proper oral hygiene  Vaccinations: Influenza vaccine: completed 04/22/19 Pneumococcal vaccine: Prevnar received 01/03/17; Pneumovax 23 recommended Tdap vaccine:we will request records from health @ work  Shingles vaccine: Please call your insurance company to determine your out of pocket expense for the Shingrix vaccine. You may receive this vaccine at your local pharmacy. (we will request records from health @ work for varicella titer)   Advanced directives: Please bring a copy of your POA (Power of [place]) and/or Living Will to your next appointment.  Goals: Recommend to drink at least 6-8 8oz glasses of water per day and consume a balanced diet rich in fresh fruits and vegetables.   Next appointment: Please schedule your Annual Wellness Visit with your Nurse Health Advisor in one year.  Preventive Care 71 Years and Older, Female Preventive care refers to lifestyle choices and visits with your health care provider that can promote health and wellness. What does preventive care include?  A yearly physical exam. This is also called an annual well check.  Dental exams once or twice a year.  Routine eye exams. Ask your health care provider how often you should have your eyes checked.  Personal lifestyle choices, including:  Daily care of your teeth and gums.  Regular physical  activity.  Eating a healthy diet.  Avoiding tobacco and drug use.  Limiting alcohol use.  Practicing safe sex.  Taking low-dose aspirin every day if recommended by your health care provider.  Taking vitamin and mineral supplements as recommended by your health care provider. What happens during an annual well check? The services and screenings done by your health care provider during your annual well check will depend on your age, overall health, lifestyle risk factors, and family history of disease. Counseling  Your health care provider may ask you questions about your:  Alcohol use.  Tobacco use.  Drug use.  Emotional well-being.  Home and relationship well-being.  Sexual activity.  Eating habits.  History of falls.  Memory and ability to understand (cognition).  Work and work Statistician.  Reproductive health. Screening  You may have the following tests or measurements:  Height, weight, and BMI.  Blood pressure.  Lipid and cholesterol levels. These may be checked every 5 years, or more frequently if you are over 71 years old.  Skin check.  Lung cancer screening. You may have this screening every year starting at age 71 if you have a 30-pack-year history of smoking and currently smoke or have quit within the past 15 years.  Fecal occult blood test (FOBT) of the stool. You may have this test every year starting at age 71.  Flexible sigmoidoscopy or colonoscopy. You may have a sigmoidoscopy every 5 years or a colonoscopy every 10 years starting at age 71.  Hepatitis C blood test.  Hepatitis B blood test.  Sexually transmitted disease (STD) testing.  Diabetes screening. This is done by checking your  blood sugar (glucose) after you have not eaten for a while (fasting). You may have this done every 1-3 years.  Bone density scan. This is done to screen for osteoporosis. You may have this done starting at age 71.  Mammogram. This may be done every 1-2  years. Talk to your health care provider about how often you should have regular mammograms. Talk with your health care provider about your test results, treatment options, and if necessary, the need for more tests. Vaccines  Your health care provider may recommend certain vaccines, such as:  Influenza vaccine. This is recommended every year.  Tetanus, diphtheria, and acellular pertussis (Tdap, Td) vaccine. You may need a Td booster every 10 years.  Zoster vaccine. You may need this after age 71.  Pneumococcal 13-valent conjugate (PCV13) vaccine. One dose is recommended after age 71.  Pneumococcal polysaccharide (PPSV23) vaccine. One dose is recommended after age 71. Talk to your health care provider about which screenings and vaccines you need and how often you need them. This information is not intended to replace advice given to you by your health care provider. Make sure you discuss any questions you have with your health care provider. Document Released: 07/22/2015 Document Revised: 03/14/2016 Document Reviewed: 04/26/2015 Elsevier Interactive Patient Education  2017 [place] Prevention in the Home Falls can cause injuries. They can happen to people of all ages. There are many things you can do to make your home safe and to help prevent falls. What can I do on the outside of my home?  Regularly fix the edges of walkways and driveways and fix any cracks.  Remove anything that might make you trip as you walk through a door, such as a raised step or threshold.  Trim any bushes or trees on the path to your home.  Use bright outdoor lighting.  Clear any walking paths of anything that might make someone trip, such as rocks or tools.  Regularly check to see if handrails are loose or broken. Make sure that both sides of any steps have handrails.  Any raised decks and porches should have guardrails on the edges.  Have any leaves, snow, or ice cleared regularly.  Use  sand or salt on walking paths during winter.  Clean up any spills in your garage right away. This includes oil or grease spills. What can I do in the bathroom?  Use night lights.  Install grab bars by the toilet and in the tub and shower. Do not use towel bars as grab bars.  Use non-skid mats or decals in the tub or shower.  If you need to sit down in the shower, use a plastic, non-slip stool.  Keep the floor dry. Clean up any water that spills on the floor as soon as it happens.  Remove soap buildup in the tub or shower regularly.  Attach bath mats securely with double-sided non-slip rug tape.  Do not have throw rugs and other things on the floor that can make you trip. What can I do in the bedroom?  Use night lights.  Make sure that you have a light by your bed that is easy to reach.  Do not use any sheets or blankets that are too big for your bed. They should not hang down onto the floor.  Have a firm chair that has side arms. You can use this for support while you get dressed.  Do not have throw rugs and other things on the floor that  can make you trip. What can I do in the kitchen?  Clean up any spills right away.  Avoid walking on wet floors.  Keep items that you use a lot in easy-to-reach places.  If you need to reach something above you, use a strong step stool that has a grab bar.  Keep electrical cords out of the way.  Do not use floor polish or wax that makes floors slippery. If you must use wax, use non-skid floor wax.  Do not have throw rugs and other things on the floor that can make you trip. What can I do with my stairs?  Do not leave any items on the stairs.  Make sure that there are handrails on both sides of the stairs and use them. Fix handrails that are broken or loose. Make sure that handrails are as long as the stairways.  Check any carpeting to make sure that it is firmly attached to the stairs. Fix any carpet that is loose or worn.  Avoid  having throw rugs at the top or bottom of the stairs. If you do have throw rugs, attach them to the floor with carpet tape.  Make sure that you have a light switch at the top of the stairs and the bottom of the stairs. If you do not have them, ask someone to add them for you. What else can I do to help prevent falls?  Wear shoes that:  Do not have high heels.  Have rubber bottoms.  Are comfortable and fit you well.  Are closed at the toe. Do not wear sandals.  If you use a stepladder:  Make sure that it is fully opened. Do not climb a closed stepladder.  Make sure that both sides of the stepladder are locked into place.  Ask someone to hold it for you, if possible.  Clearly mark and make sure that you can see:  Any grab bars or handrails.  First and last steps.  Where the edge of each step is.  Use tools that help you move around (mobility aids) if they are needed. These include:  Canes.  Walkers.  Scooters.  Crutches.  Turn on the lights when you go into a dark area. Replace any light bulbs as soon as they burn out.  Set up your furniture so you have a clear path. Avoid moving your furniture around.  If any of your floors are uneven, fix them.  If there are any pets around you, be aware of where they are.  Review your medicines with your doctor. Some medicines can make you feel dizzy. This can increase your chance of falling. Ask your doctor what other things that you can do to help prevent falls. This information is not intended to replace advice given to you by your health care provider. Make sure you discuss any questions you have with your health care provider. Document Released: 04/21/2009 Document Revised: 12/01/2015 Document Reviewed: 07/30/2014 Elsevier Interactive Patient Education  2017 [employer].

## 2019-05-28 ENCOUNTER — Other Ambulatory Visit: Payer: Self-pay | Admitting: General Practice

## 2019-05-28 ENCOUNTER — Encounter: Payer: Self-pay | Admitting: Family Medicine

## 2019-05-28 ENCOUNTER — Other Ambulatory Visit (INDEPENDENT_AMBULATORY_CARE_PROVIDER_SITE_OTHER): Payer: PPO

## 2019-05-28 DIAGNOSIS — R748 Abnormal levels of other serum enzymes: Secondary | ICD-10-CM

## 2019-05-28 DIAGNOSIS — E785 Hyperlipidemia, unspecified: Secondary | ICD-10-CM

## 2019-05-28 LAB — GAMMA GT: GGT: 19 U/L (ref 7–51)

## 2019-05-28 MED ORDER — ROSUVASTATIN CALCIUM 10 MG PO TABS: 10.0000 mg | ORAL_TABLET | Freq: Every day | ORAL | 6 refills | Status: DC

## 2019-06-01 MED FILL — ROSUVASTATIN CALCIUM 10 MG: 10 | 30 days supply | Qty: 30 | Fill #0

## 2019-06-05 IMAGING — CT CT ANGIO CHEST
2 of 7 series · 19 of 46 positions shown · IV contrast (APPLIED)
Comparison: Chest x-ray from today.  Chest CT October 27, 2009

CLINICAL DATA: Shortness of breath and chest tightness. Breast
cancer 9 years ago.

EXAM:
CT ANGIOGRAPHY CHEST WITH CONTRAST
TECHNIQUE: Multidetector CT imaging of the chest was performed using the
standard protocol during bolus administration of intravenous
contrast. Multiplanar CT image reconstructions and MIPs were
obtained to evaluate the vascular anatomy.
CONTRAST:  100 mL of Isovue 370

[Series 7: thins · axial · 0.56mm/px · z∈[+1252,+1526]mm · 16 of 442 slices shown]
[im 25/442  lung]
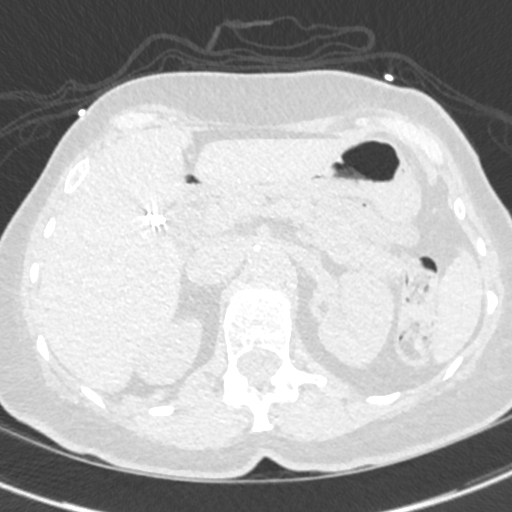
[im 50/442  soft-tissue]
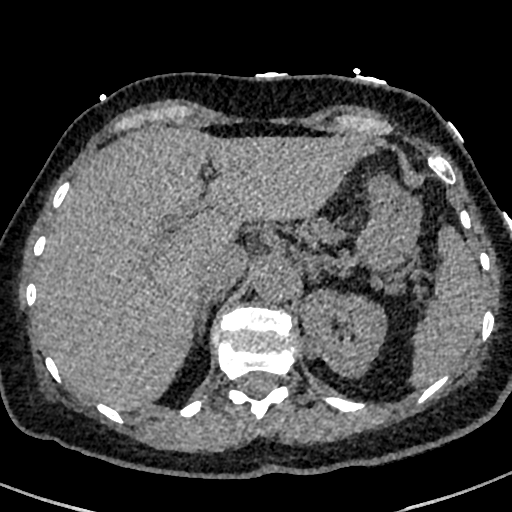
[im 74/442  lung]
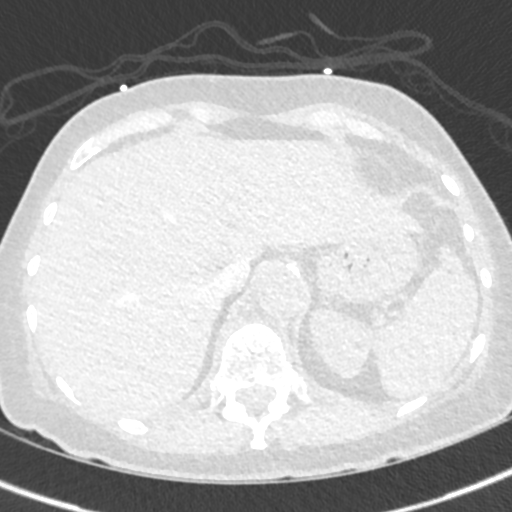
[im 99/442  soft-tissue]
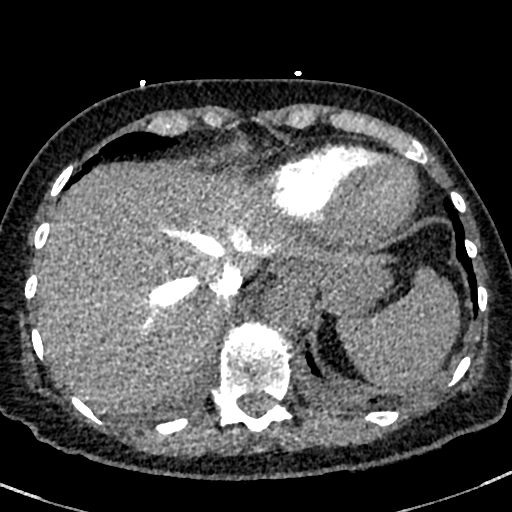
[im 123/442  lung]
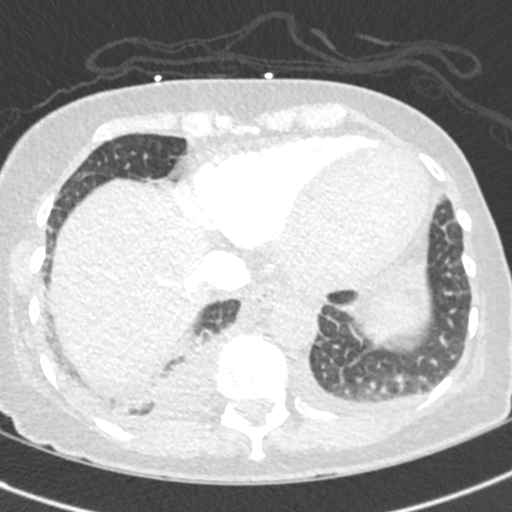
[im 148/442  soft-tissue]
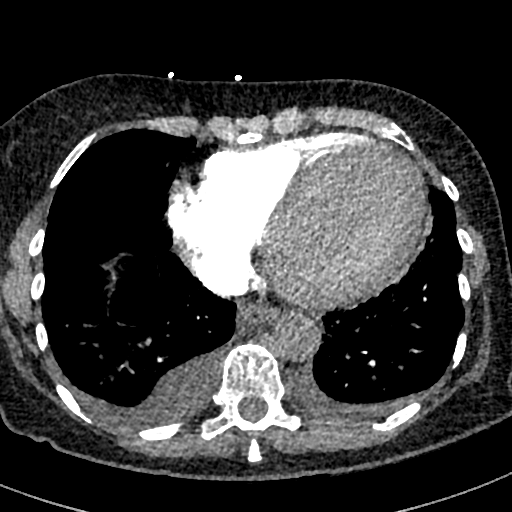
[im 172/442  lung]
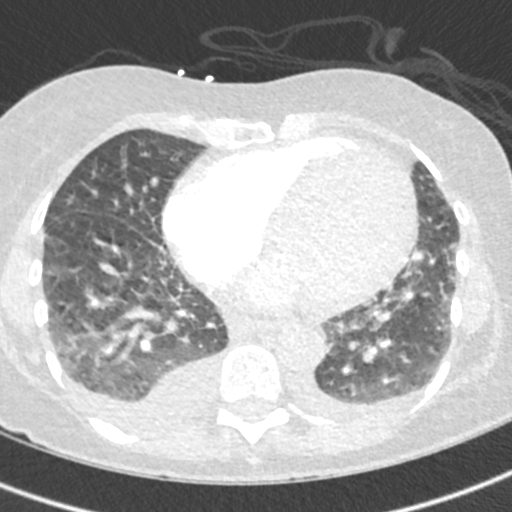
[im 197/442  soft-tissue]
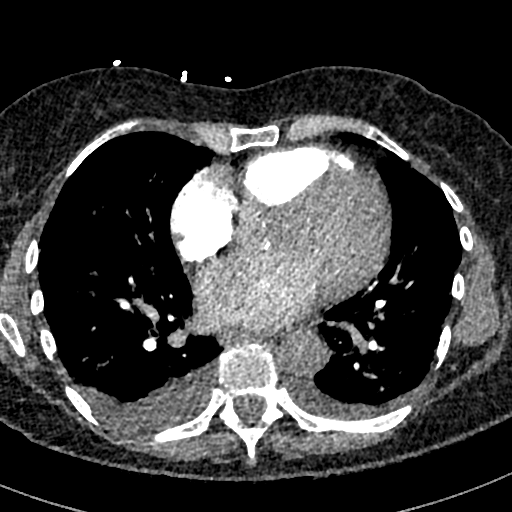
[im 246/442  lung]
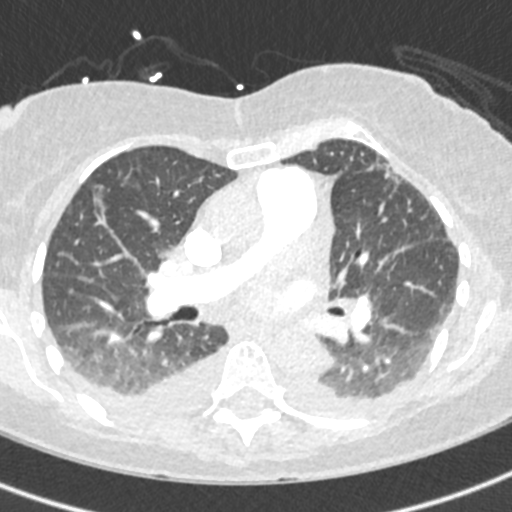
[im 270/442  soft-tissue]
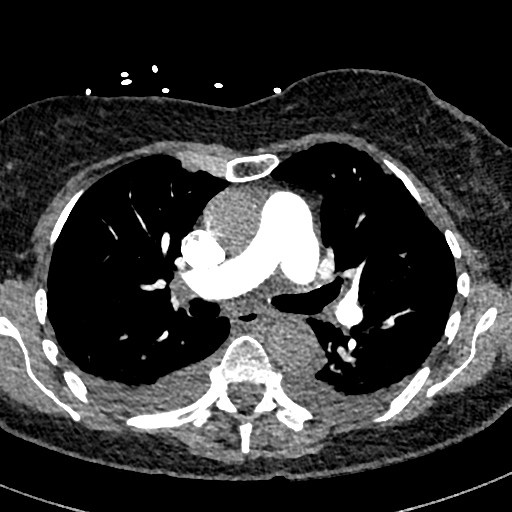
[im 295/442  lung]
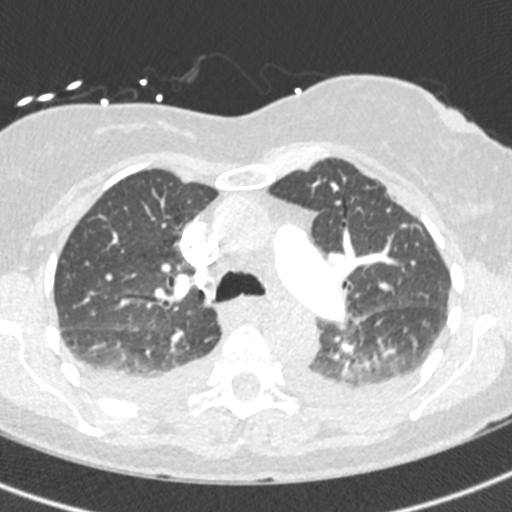
[im 319/442  soft-tissue]
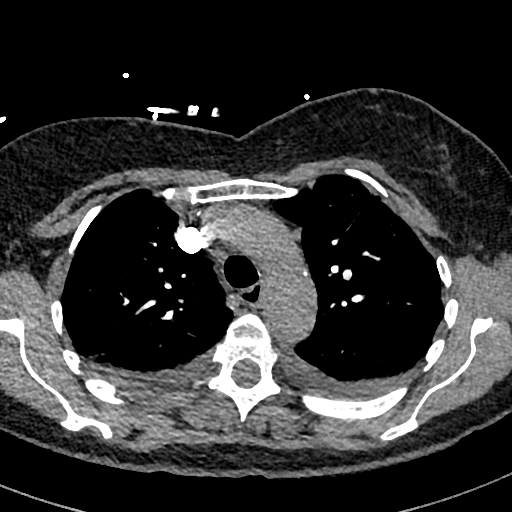
[im 344/442  lung]
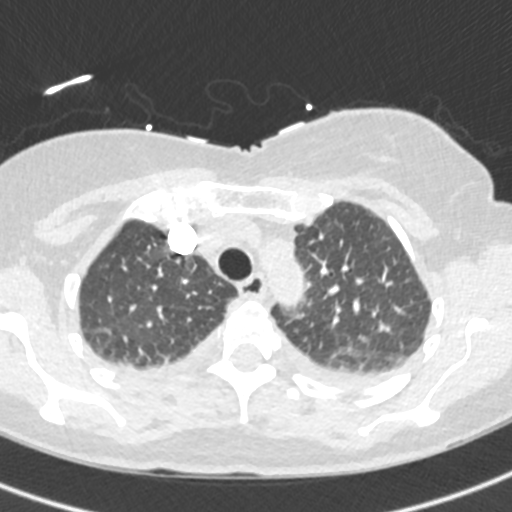
[im 368/442  soft-tissue]
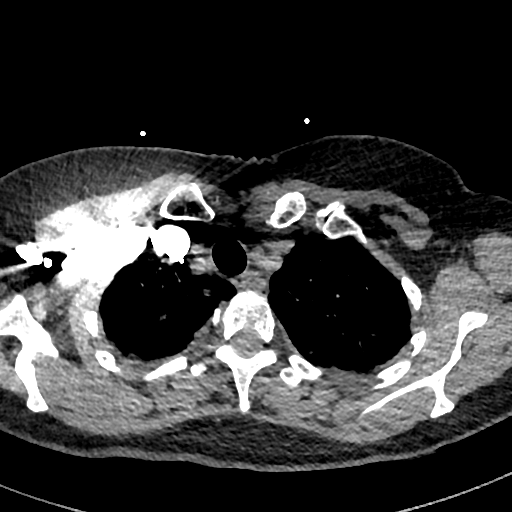
[im 393/442  lung]
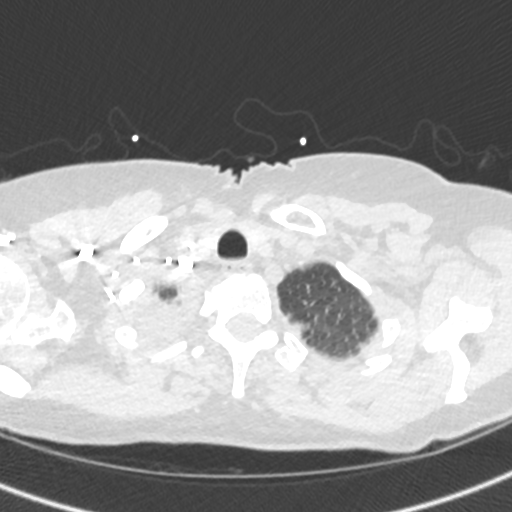
[im 417/442  soft-tissue]
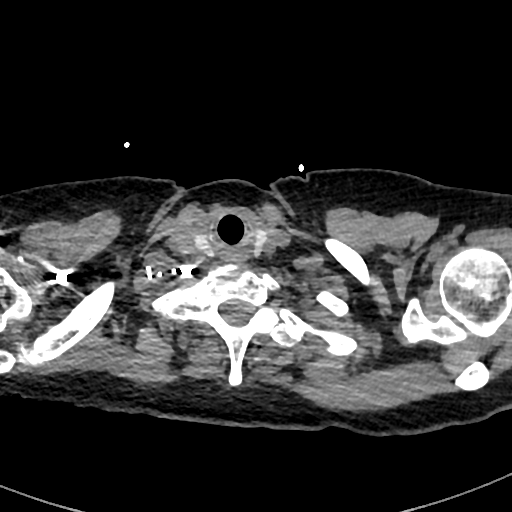

[Series 8: cor · coronal · 0.61mm/px · 3 of 100 slices shown]
[im 25/100  soft-tissue]
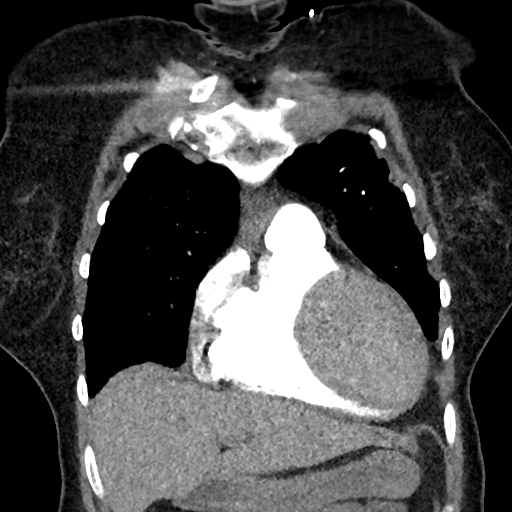
[im 50/100  soft-tissue]
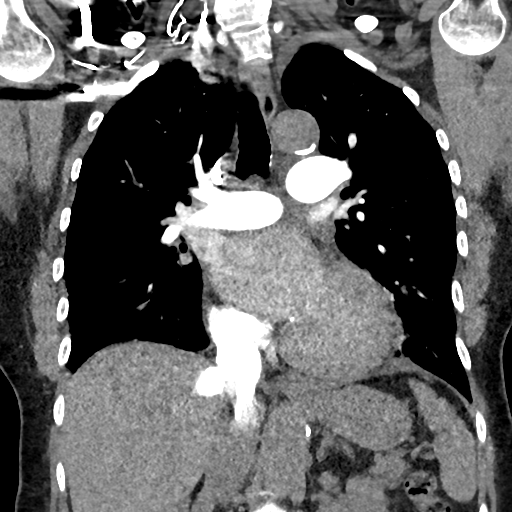
[im 75/100  soft-tissue]
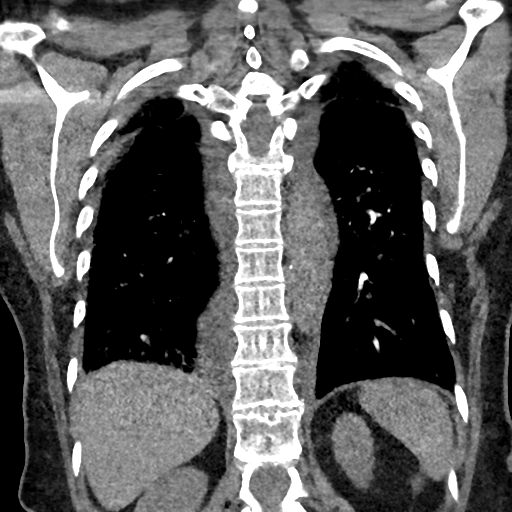

[19 of 46 positions shown; findings below may reference images not displayed]

FINDINGS: Cardiovascular: Cardiomegaly is identified. Mild coronary artery
calcifications. Evaluation of the thoracic aorta is limited due to
timing of contrast but no aneurysm is seen. Atherosclerotic changes
noted. No pulmonary emboli are identified.

Mediastinum/Nodes: Small bilateral pleural effusions. No pericardial
effusion. No adenopathy. The thyroid and esophagus are normal.

Lungs/Pleura: Central airways are normal. No pneumothorax.
Interlobular septal thickening, ground-glass, and bilateral pleural
effusions are consistent with pulmonary edema. There is a flat
nodular density in the medial right apex on series 6, image 27 not
seen in 5000 measuring up to 6 mm. No other nodules. No masses. No
evidence of pneumonia or aspiration.

Upper Abdomen: No acute abnormality.

Musculoskeletal: No chest wall abnormality. No acute or significant
osseous findings.

Review of the MIP images confirms the above findings.
IMPRESSION: 1. No pulmonary emboli.
2. Cardiomegaly, small bilateral effusions, and pulmonary edema.
3. Atherosclerotic change in the thoracic aorta.
4. Flat nodular density in the medial right apex may be part of the
broader process. However, this finding was not seen in 5000 and the
patient has a history of breast cancer. As a result, recommend a
short-term follow-up in approximately 3-6 months after resolution of
symptoms to ensure resolution.

Aortic Atherosclerosis ([id number]-BC3.3).

## 2019-06-10 DIAGNOSIS — M25511 Pain in right shoulder: Secondary | ICD-10-CM | POA: Diagnosis not present

## 2019-06-15 DIAGNOSIS — G4733 Obstructive sleep apnea (adult) (pediatric): Secondary | ICD-10-CM | POA: Diagnosis not present

## 2019-06-16 DIAGNOSIS — H353131 Nonexudative age-related macular degeneration, bilateral, early dry stage: Secondary | ICD-10-CM | POA: Diagnosis not present

## 2019-06-29 DIAGNOSIS — M25511 Pain in right shoulder: Secondary | ICD-10-CM | POA: Diagnosis not present

## 2019-07-04 MED FILL — LOSARTAN POTASSIUM 25 MG TA: 25 | 30 days supply | Qty: 60 | Fill #1

## 2019-07-06 ENCOUNTER — Other Ambulatory Visit (HOSPITAL_COMMUNITY): Payer: Self-pay | Admitting: Internal Medicine

## 2019-07-06 MED FILL — SPIRONOLACTONE 25 MG TABS: 25 | 90 days supply | Qty: 45 | Fill #0

## 2019-07-06 MED FILL — AMIODARONE HCL 100 MG TABS: 100 | 90 days supply | Qty: 90 | Fill #1

## 2019-07-09 DIAGNOSIS — M25511 Pain in right shoulder: Secondary | ICD-10-CM | POA: Diagnosis not present

## 2019-07-16 DIAGNOSIS — G4733 Obstructive sleep apnea (adult) (pediatric): Secondary | ICD-10-CM | POA: Diagnosis not present

## 2019-07-16 DIAGNOSIS — M75121 Complete rotator cuff tear or rupture of right shoulder, not specified as traumatic: Secondary | ICD-10-CM | POA: Diagnosis not present

## 2019-07-16 DIAGNOSIS — M25511 Pain in right shoulder: Secondary | ICD-10-CM | POA: Diagnosis not present

## 2019-07-27 DIAGNOSIS — M25511 Pain in right shoulder: Secondary | ICD-10-CM | POA: Diagnosis not present

## 2019-07-27 DIAGNOSIS — M75121 Complete rotator cuff tear or rupture of right shoulder, not specified as traumatic: Secondary | ICD-10-CM | POA: Diagnosis not present

## 2019-07-28 ENCOUNTER — Ambulatory Visit: Payer: PPO | Attending: Internal Medicine

## 2019-07-28 DIAGNOSIS — Z23 Encounter for immunization: Secondary | ICD-10-CM | POA: Insufficient documentation

## 2019-07-28 NOTE — Progress Notes (Signed)
   Covid-19 Vaccination Clinic  Name:  Patricia Collier    MRN: [id number] DOB: 1946-01-21  07/28/2019  [name] was observed post Covid-19 immunization for 15 minutes without incidence. She was provided with Vaccine Information Sheet and instruction to access the V-Safe system.   [name] was instructed to call 911 with any severe reactions post vaccine: Marland Kitchen Difficulty breathing  . Swelling of your face and throat  . A fast heartbeat  . A bad rash all over your body  . Dizziness and weakness    Immunizations Administered    Name Date Dose VIS Date Route   Pfizer COVID-19 Vaccine 07/28/2019  2:24 PM 0.3 mL 06/19/2019 Intramuscular   Manufacturer: [place]   Lot: [id number]   [place]: [id number]

## 2019-08-04 ENCOUNTER — Other Ambulatory Visit (HOSPITAL_COMMUNITY): Payer: Self-pay | Admitting: Internal Medicine

## 2019-08-05 ENCOUNTER — Other Ambulatory Visit (HOSPITAL_COMMUNITY): Payer: Self-pay

## 2019-08-05 MED ORDER — LOSARTAN POTASSIUM 25 MG PO TABS: 25.0000 mg | ORAL_TABLET | Freq: Two times a day (BID) | ORAL | 3 refills | Status: DC

## 2019-08-05 MED FILL — LOSARTAN POTASSIUM 25 MG TA: 25 | 30 days supply | Qty: 60 | Fill #0

## 2019-08-07 ENCOUNTER — Telehealth (HOSPITAL_COMMUNITY): Payer: Self-pay | Admitting: *Deleted

## 2019-08-07 NOTE — Telephone Encounter (Signed)
Received fax from [facility], pt needs clearance to have a R shoulder scope w/RCR; general anesthesia with Dr Onnie Graham  Per Dr Haroldine Laws:  Pt is cleared for surgery  Form faxed back to them at [phone] atten: [email or web address]

## 2019-08-12 DIAGNOSIS — M79671 Pain in right foot: Secondary | ICD-10-CM | POA: Diagnosis not present

## 2019-08-12 DIAGNOSIS — M2041 Other hammer toe(s) (acquired), right foot: Secondary | ICD-10-CM | POA: Diagnosis not present

## 2019-08-12 DIAGNOSIS — M7741 Metatarsalgia, right foot: Secondary | ICD-10-CM | POA: Diagnosis not present

## 2019-08-17 ENCOUNTER — Ambulatory Visit: Payer: PPO | Attending: Internal Medicine

## 2019-08-17 DIAGNOSIS — Z23 Encounter for immunization: Secondary | ICD-10-CM

## 2019-08-17 NOTE — Progress Notes (Signed)
   Covid-19 Vaccination Clinic  Name:  Patricia Collier    MRN: [id number] DOB: Aug 21, 1945  08/17/2019  Patricia Collier was observed post Covid-19 immunization for 15 minutes without incidence. She was provided with Vaccine Information Sheet and instruction to access the V-Safe system.   Patricia Collier was instructed to call 911 with any severe reactions post vaccine: Marland Kitchen Difficulty breathing  . Swelling of your face and throat  . A fast heartbeat  . A bad rash all over your body  . Dizziness and weakness    Immunizations Administered    Name Date Dose VIS Date Route   Pfizer COVID-19 Vaccine 08/17/2019  3:07 PM 0.3 mL 06/19/2019 Intramuscular   Manufacturer: [place]   Lot: [id number]   [place]: [id number]

## 2019-08-27 ENCOUNTER — Ambulatory Visit: Payer: PPO

## 2019-08-30 DIAGNOSIS — Z03818 Encounter for observation for suspected exposure to other biological agents ruled out: Secondary | ICD-10-CM | POA: Diagnosis not present

## 2019-08-30 DIAGNOSIS — Z20828 Contact with and (suspected) exposure to other viral communicable diseases: Secondary | ICD-10-CM | POA: Diagnosis not present

## 2019-08-30 DIAGNOSIS — Z20822 Contact with and (suspected) exposure to covid-19: Secondary | ICD-10-CM | POA: Diagnosis not present

## 2019-09-01 DIAGNOSIS — M75121 Complete rotator cuff tear or rupture of right shoulder, not specified as traumatic: Secondary | ICD-10-CM | POA: Diagnosis not present

## 2019-09-01 DIAGNOSIS — Y999 Unspecified external cause status: Secondary | ICD-10-CM | POA: Diagnosis not present

## 2019-09-01 DIAGNOSIS — S46011A Strain of muscle(s) and tendon(s) of the rotator cuff of right shoulder, initial encounter: Secondary | ICD-10-CM | POA: Diagnosis not present

## 2019-09-01 DIAGNOSIS — M19011 Primary osteoarthritis, right shoulder: Secondary | ICD-10-CM | POA: Diagnosis not present

## 2019-09-01 DIAGNOSIS — S43431A Superior glenoid labrum lesion of right shoulder, initial encounter: Secondary | ICD-10-CM | POA: Diagnosis not present

## 2019-09-01 DIAGNOSIS — M659 Synovitis and tenosynovitis, unspecified: Secondary | ICD-10-CM | POA: Diagnosis not present

## 2019-09-01 DIAGNOSIS — S43491A Other sprain of right shoulder joint, initial encounter: Secondary | ICD-10-CM | POA: Diagnosis not present

## 2019-09-01 DIAGNOSIS — G8918 Other acute postprocedural pain: Secondary | ICD-10-CM | POA: Diagnosis not present

## 2019-09-01 DIAGNOSIS — X58XXXA Exposure to other specified factors, initial encounter: Secondary | ICD-10-CM | POA: Diagnosis not present

## 2019-09-01 DIAGNOSIS — M65811 Other synovitis and tenosynovitis, right shoulder: Secondary | ICD-10-CM | POA: Diagnosis not present

## 2019-09-01 DIAGNOSIS — M7541 Impingement syndrome of right shoulder: Secondary | ICD-10-CM | POA: Diagnosis not present

## 2019-09-01 MED FILL — CYCLOBENZAPRINE HCL 10 MG T: 10 | 7 days supply | Qty: 30 | Fill #0

## 2019-09-01 MED FILL — ONDANSETRON HCL 4 MG TABLET: 4 | 2 days supply | Qty: 10 | Fill #0

## 2019-09-01 MED FILL — OXYCODONE-APAP 5-325MG: 5-325 | 3 days supply | Qty: 20 | Fill #0

## 2019-09-07 MED FILL — LOSARTAN POTASSIUM 25 MG TA: 25 | 30 days supply | Qty: 60 | Fill #1

## 2019-09-09 DIAGNOSIS — M25611 Stiffness of right shoulder, not elsewhere classified: Secondary | ICD-10-CM | POA: Diagnosis not present

## 2019-09-09 DIAGNOSIS — M25511 Pain in right shoulder: Secondary | ICD-10-CM | POA: Diagnosis not present

## 2019-09-15 DIAGNOSIS — M25511 Pain in right shoulder: Secondary | ICD-10-CM | POA: Diagnosis not present

## 2019-09-15 DIAGNOSIS — M25611 Stiffness of right shoulder, not elsewhere classified: Secondary | ICD-10-CM | POA: Diagnosis not present

## 2019-09-23 DIAGNOSIS — M25511 Pain in right shoulder: Secondary | ICD-10-CM | POA: Diagnosis not present

## 2019-09-23 DIAGNOSIS — M25611 Stiffness of right shoulder, not elsewhere classified: Secondary | ICD-10-CM | POA: Diagnosis not present

## 2019-10-01 DIAGNOSIS — M25511 Pain in right shoulder: Secondary | ICD-10-CM | POA: Diagnosis not present

## 2019-10-01 DIAGNOSIS — M25611 Stiffness of right shoulder, not elsewhere classified: Secondary | ICD-10-CM | POA: Diagnosis not present

## 2019-10-02 MED FILL — SPIRONOLACTONE 25 MG TABS: 25 | 90 days supply | Qty: 45 | Fill #1

## 2019-10-03 ENCOUNTER — Other Ambulatory Visit (HOSPITAL_COMMUNITY): Payer: Self-pay | Admitting: Internal Medicine

## 2019-10-03 MED FILL — LOSARTAN POTASSIUM 25 MG TA: 25 | 30 days supply | Qty: 60 | Fill #0

## 2019-10-05 ENCOUNTER — Other Ambulatory Visit (HOSPITAL_COMMUNITY): Payer: Self-pay | Admitting: Internal Medicine

## 2019-10-05 DIAGNOSIS — M25511 Pain in right shoulder: Secondary | ICD-10-CM | POA: Diagnosis not present

## 2019-10-05 DIAGNOSIS — M25611 Stiffness of right shoulder, not elsewhere classified: Secondary | ICD-10-CM | POA: Diagnosis not present

## 2019-10-05 MED FILL — CARVEDILOL 3.125 MG TABLET: 3.125 | 90 days supply | Qty: 180 | Fill #0

## 2019-10-05 MED FILL — AMIODARONE HCL 100 MG TABS: 100 | 90 days supply | Qty: 90 | Fill #0

## 2019-10-06 ENCOUNTER — Telehealth: Payer: Self-pay | Admitting: Family Medicine

## 2019-10-06 NOTE — Progress Notes (Signed)
  Chronic Care Management   Outreach Note  10/06/2019 Name: Patricia Collier MRN: [id number] DOB: [birth year]  Referred by: [name], MD Reason for referral : No chief complaint on file.   An unsuccessful telephone outreach was attempted today. The patient was referred to the pharmacist for assistance with care management and care coordination.   Follow Up Plan:   Earney Hamburg Upstream Scheduler

## 2019-10-07 DIAGNOSIS — M25611 Stiffness of right shoulder, not elsewhere classified: Secondary | ICD-10-CM | POA: Diagnosis not present

## 2019-10-07 DIAGNOSIS — M25511 Pain in right shoulder: Secondary | ICD-10-CM | POA: Diagnosis not present

## 2019-10-12 DIAGNOSIS — M25611 Stiffness of right shoulder, not elsewhere classified: Secondary | ICD-10-CM | POA: Diagnosis not present

## 2019-10-12 DIAGNOSIS — M25511 Pain in right shoulder: Secondary | ICD-10-CM | POA: Diagnosis not present

## 2019-10-14 ENCOUNTER — Telehealth: Payer: Self-pay | Admitting: Family Medicine

## 2019-10-14 DIAGNOSIS — M25511 Pain in right shoulder: Secondary | ICD-10-CM | POA: Diagnosis not present

## 2019-10-14 DIAGNOSIS — M25611 Stiffness of right shoulder, not elsewhere classified: Secondary | ICD-10-CM | POA: Diagnosis not present

## 2019-10-14 NOTE — Progress Notes (Signed)
  Chronic Care Management   Note  10/14/2019 Name: [name] MRN: [id number] DOB: 07-21-1945  Patricia Collier is a 74 y.o. year old female who is a primary care patient of Birdie Riddle, Aundra Millet, MD. I reached out to [name] by phone today in response to a referral sent by [name]'s PCP, [name], MD.   [name] was given information about Chronic Care Management services today including:  1. CCM service includes personalized support from designated clinical staff supervised by her physician, including individualized plan of care and coordination with other care providers 2. 24/7 contact phone numbers for assistance for urgent and routine care needs. 3. Service will only be billed when office clinical staff spend 20 minutes or more in a month to coordinate care. 4. Only one practitioner may furnish and bill the service in a calendar month. 5. The patient may stop CCM services at any time (effective at the end of the month) by phone call to the office staff.   Patient agreed to services and verbal consent obtained.   Follow up plan:   Earney Hamburg Upstream Scheduler

## 2019-10-19 DIAGNOSIS — M25511 Pain in right shoulder: Secondary | ICD-10-CM | POA: Diagnosis not present

## 2019-10-19 DIAGNOSIS — M25611 Stiffness of right shoulder, not elsewhere classified: Secondary | ICD-10-CM | POA: Diagnosis not present

## 2019-10-21 DIAGNOSIS — M25611 Stiffness of right shoulder, not elsewhere classified: Secondary | ICD-10-CM | POA: Diagnosis not present

## 2019-10-21 DIAGNOSIS — M25511 Pain in right shoulder: Secondary | ICD-10-CM | POA: Diagnosis not present

## 2019-10-26 DIAGNOSIS — M25611 Stiffness of right shoulder, not elsewhere classified: Secondary | ICD-10-CM | POA: Diagnosis not present

## 2019-10-26 DIAGNOSIS — M25511 Pain in right shoulder: Secondary | ICD-10-CM | POA: Diagnosis not present

## 2019-10-27 ENCOUNTER — Other Ambulatory Visit: Payer: Self-pay | Admitting: General Practice

## 2019-10-27 DIAGNOSIS — E785 Hyperlipidemia, unspecified: Secondary | ICD-10-CM

## 2019-10-27 DIAGNOSIS — I5022 Chronic systolic (congestive) heart failure: Secondary | ICD-10-CM

## 2019-10-28 DIAGNOSIS — M25611 Stiffness of right shoulder, not elsewhere classified: Secondary | ICD-10-CM | POA: Diagnosis not present

## 2019-10-28 DIAGNOSIS — M25511 Pain in right shoulder: Secondary | ICD-10-CM | POA: Diagnosis not present

## 2019-11-02 ENCOUNTER — Ambulatory Visit: Payer: PPO

## 2019-11-02 DIAGNOSIS — I5022 Chronic systolic (congestive) heart failure: Secondary | ICD-10-CM

## 2019-11-02 DIAGNOSIS — M25611 Stiffness of right shoulder, not elsewhere classified: Secondary | ICD-10-CM | POA: Diagnosis not present

## 2019-11-02 DIAGNOSIS — M25511 Pain in right shoulder: Secondary | ICD-10-CM | POA: Diagnosis not present

## 2019-11-02 DIAGNOSIS — E785 Hyperlipidemia, unspecified: Secondary | ICD-10-CM

## 2019-11-02 NOTE — Progress Notes (Signed)
Chronic Care Management Pharmacy  Name: Patricia Collier  MRN: [id number] DOB: [birth year]  Chief Complaint/ HPI Patricia Collier,  74 y.o. , female presents for their Initial CCM visit with the clinical pharmacist via telephone due to COVID-19 Pandemic.  PCP : [name], MD  Their chronic conditions include: CHF, hyperlipidemia, breast cancer.  Office Visits: 11/18 (PCP): hyperlipidemia, TG and LDL elevated; instructed to f/u 08/2019.  Outpatient Encounter Medications as of 11/02/2019  Medication Sig  . amiodarone (PACERONE) 100 MG tablet TAKE 1 TABLET BY MOUTH ONCE DAILY  . Biotin (BIOTIN 5000) 5 MG CAPS Take by mouth daily.  . carvedilol (COREG) 3.125 MG tablet TAKE 1 TABLET (3.125 MG TOTAL) BY MOUTH 2 TIMES DAILY.  Marland Kitchen Cholecalciferol (VITAMIN D-3) 1000 units CAPS Take 2 capsules by mouth daily.   Marland Kitchen losartan (COZAAR) 25 MG tablet Take 1 tablet (25 mg total) by mouth 2 (two) times daily.  . Magnesium 250 MG TABS Take 1 tablet by mouth daily.   . rosuvastatin (CRESTOR) 10 MG tablet Take 1 tablet (10 mg total) by mouth daily.  Marland Kitchen spironolactone (ALDACTONE) 25 MG tablet TAKE 1/2 TABLETS (12.5 MG TOTAL) BY MOUTH DAILY.  . Turmeric 400 MG CAPS Take 400 mg by mouth daily.  . furosemide (LASIX) 20 MG tablet Take 20 mg by mouth daily as needed.   No facility-administered encounter medications on file as of 11/02/2019.   Current Diagnosis/Assessment: Goals Addressed            This Visit's Progress   . Heart Failure: Minimize recurring symptoms.       CARE PLAN ENTRY  Current Barriers:  . Chronic Disease Management support, education, and care coordination needs related to  heart failure. o Currently taking amiodarone 100 mg once daily, carvedilol 3.125 mg twice daily, losartan 25 mg twice daily, spironolactone 12.5 mg once daily, magnesium 400 mg once daily  Pharmacist Clinical Goal(s):  Marland Kitchen Minimize recurring symptoms of heart failure.   Interventions: .  Comprehensive medication review performed.  Patient Self Care Activities:  . Patient verbalizes understanding of plan to call with any questions over next three months.  Initial goal documentation.     . Hyperlipidemia: LDL <100, TG <150       CARE PLAN ENTRY (see longitudinal plan of care for additional care plan information)  Current Barriers:  . Uncontrolled hyperlipidemia . Current antihyperlipidemic regimen: Diet and Exervise . Previous antihyperlipidemic medications tried: n/a  . Most recent lipid panel:     Component Value Date/Time   CHOL 279 (H) 05/27/2019 1337   TRIG 248.0 (H) 05/27/2019 1337   HDL 61.60 05/27/2019 1337   CHOLHDL 5 05/27/2019 1337   VLDL 49.6 (H) 05/27/2019 1337   LDLCALC 162 (H) 07/24/2018 1100   LDLDIRECT 175.0 05/27/2019 1337   10-year ASCVD risk score: 13.6%  Pharmacist Clinical Goal(s):  Marland Kitchen Over the next 90 days, patient will work with PharmD and providers towards optimized antihyperlipidemic therapy  Interventions: . Comprehensive medication review performed; medication list updated in electronic medical record.  Bertram Savin care team collaboration (see longitudinal plan of care)  Patient Self Care Activities:  . Over next three months: focus on diet and exercise for cholesterol control. Consider alternative administration of rosuvastatin that we discussed, including rosuvastatin 10 mg 2-3x/week.  . Please call with any questions!  Initial goal documentation      Hyperlipidemia   Lipid Panel     Component Value Date/Time   CHOL 279 (  H) 05/27/2019 1337   TRIG 248.0 (H) 05/27/2019 1337   HDL 61.60 05/27/2019 1337   CHOLHDL 5 05/27/2019 1337   VLDL 49.6 (H) 05/27/2019 1337   LDLCALC 162 (H) 07/24/2018 1100   LDLDIRECT 175.0 05/27/2019 1337   The 10-year ASCVD risk score Mikey Bussing DC Jr., et al., 2013) is: 13.6%   Values used to calculate the score:     Age: 31 years     Sex: Female     Is Non-Hispanic African American: No      Diabetic: No     Tobacco smoker: No     Systolic Blood Pressure: 123XX123 mmHg     Is BP treated: Yes     HDL Cholesterol: 61.6 mg/dL     Total Cholesterol: 279 mg/dL   Patient has tried these meds in past: atorvastatin 20 mg. Patient is currently uncontrolled on the following medications: diet and exercise alone. Prescribed rosuvastatin 10 mg 05/2020 but wishes to manage with diet and exercise.Concerns of side effects, such as memory loss and muscle pain were mentioned. We discussed side effects and alternative administration techniques of statins. Patient wishes to discuss further at next visit. Encouraged to schedule follow-up OV with Dr. Birdie Riddle.   Plan Continue control with diet and exercise, consider alternative administration techniques of rosuvastatin, such as 2-3 times weekly.  Congestive Heart Failure  . Pulse Readings from Last 3 Encounters:  04/22/19 85  08/20/18 61  07/24/18 76   BP Readings from Last 3 Encounters:  05/27/19 122/68  05/27/19 122/68  04/22/19 103/65   BP at home readings reported 110s/70s. Blood pressure and pulse stable. Patient is currently controlled on the following medications: amiodarone 100 mg once daily, carvedilol 3.125 mg twice daily, losartan 25 mg twice daily, spironolactone 12.5 mg once daily, magnesium 400 mg once daily. Denies dizziness, SOB at rest, chest pain.   Plan  Continue current medications.  Vaccines  Reviewed and discussed patient's vaccination history.  Due for Shingrix. After reviewing outside records, Tdap and pneumovax have not been received. Patient states she is up to date on Tdap. Agrees to Shingrix vaccine and discuss additional vaccines at next visit.   Immunization History  Administered Date(s) Administered  . Fluad Quad(high Dose 65+) 04/22/2019  . Influenza Inj Mdck Quad With Preservative 04/23/2018  . Influenza Split 05/09/2013  . Influenza-Unspecified 03/29/2017  . PFIZER SARS-COV-2 Vaccination 07/28/2019,  08/17/2019  . Pneumococcal Conjugate-13 01/03/2017   Plan Recommended patient receive Shingrix vaccine in office/pharmacy.   Medication Management  Pt uses [place] [facility] for all medications. Uses pill box? Yes. We discussed medication management services, patient will follow-up if needing additional support.   Plan Continue current medication management strategy.   Follow up: 2 month phone visit ______________ Visit Information SDOH (Social Determinants of Health) assessments performed:  Yes.   Food Insecurity: No Food Insecurity  . Worried About Charity fundraiser in the Last Year: Never true  . Ran Out of Food in the Last Year: Never true     Transportation Needs: No Transportation Needs  . Lack of Transportation (Medical): No  . Lack of Transportation (Non-Medical): No   Ms. Wynkoop was given information about Chronic Care Management services today including:  1. CCM service includes personalized support from designated clinical staff supervised by her physician, including individualized plan of care and coordination with other care providers 2. 24/7 contact phone numbers for assistance for urgent and routine care needs. 3. Standard insurance, coinsurance, copays and  deductibles apply for chronic care management only during months in which we provide at least 20 minutes of these services. Most insurances cover these services at 100%, however patients may be responsible for any copay, coinsurance and/or deductible if applicable. This service may help you avoid the need for more expensive face-to-face services. 4. Only one practitioner may furnish and bill the service in a calendar month. 5. The patient may stop CCM services at any time (effective at the end of the month) by phone call to the office staff.  Patient agreed to services and verbal consent obtained.   Madelin Rear, Pharm.D. Clinical Pharmacist [facility] [phone]

## 2019-11-02 NOTE — Patient Instructions (Addendum)
Visit Information  Goals Addressed            This Visit's Progress   . Heart Failure: Minimize recurring symptoms.       CARE PLAN ENTRY  Current Barriers:  . Chronic Disease Management support, education, and care coordination needs related to  heart failure. o Currently taking amiodarone 100 mg once daily, carvedilol 3.125 mg twice daily, losartan 25 mg twice daily, spironolactone 12.5 mg once daily, magnesium 400 mg once daily  Pharmacist Clinical Goal(s):  Marland Kitchen Minimize recurring symptoms of heart failure.   Interventions: . Comprehensive medication review performed.  Patient Self Care Activities:  . Patient verbalizes understanding of plan to call with any questions over next three months.  Initial goal documentation.     . Hyperlipidemia: LDL <100, TG <150       CARE PLAN ENTRY (see longitudinal plan of care for additional care plan information)  Current Barriers:  . Uncontrolled hyperlipidemia . Current antihyperlipidemic regimen: Diet and Exervise . Previous antihyperlipidemic medications tried: n/a  . Most recent lipid panel:     Component Value Date/Time   CHOL 279 (H) 05/27/2019 1337   TRIG 248.0 (H) 05/27/2019 1337   HDL 61.60 05/27/2019 1337   CHOLHDL 5 05/27/2019 1337   VLDL 49.6 (H) 05/27/2019 1337   LDLCALC 162 (H) 07/24/2018 1100   LDLDIRECT 175.0 05/27/2019 1337   10-year ASCVD risk score: 13.6%  Pharmacist Clinical Goal(s):  Marland Kitchen Over the next 90 days, patient will work with PharmD and providers towards optimized antihyperlipidemic therapy  Interventions: . Comprehensive medication review performed; medication list updated in electronic medical record.  Bertram Savin care team collaboration (see longitudinal plan of care)  Patient Self Care Activities:  . Over next three months: focus on diet and exercise for cholesterol control. Consider alternative administration of rosuvastatin that we discussed, including rosuvastatin 10 mg 2-3x/week.   . Please call with any questions!  Initial goal documentation      Ms. Patricia Collier was given information about Chronic Care Management services today including:  1. CCM service includes personalized support from designated clinical staff supervised by her physician, including individualized plan of care and coordination with other care providers 2. 24/7 contact phone numbers for assistance for urgent and routine care needs. 3. Standard insurance, coinsurance, copays and deductibles apply for chronic care management only during months in which we provide at least 20 minutes of these services. Most insurances cover these services at 100%, however patients may be responsible for any copay, coinsurance and/or deductible if applicable. This service may help you avoid the need for more expensive face-to-face services. 4. Only one practitioner may furnish and bill the service in a calendar month. 5. The patient may stop CCM services at any time (effective at the end of the month) by phone call to the office staff.  Patient agreed to services and verbal consent obtained.   The patient verbalized understanding of instructions provided today and declined a print copy of patient instruction materials.  Telephone follow up appointment with pharmacy team member scheduled for: See next appointment with "Care Management Staff" under "What's Next" below.   Thank you!  Madelin Rear, Pharm.D. Clinical Pharmacist [facility] [phone]  High Cholesterol  High cholesterol is a condition in which the blood has high levels of a white, waxy, fat-like substance (cholesterol). The human body needs small amounts of cholesterol. The liver makes all the cholesterol that the body needs. Extra (excess) cholesterol comes  from the food that we eat. Cholesterol is carried from the liver by the blood through the blood vessels. If you have high cholesterol, deposits (plaques) may build up  on the walls of your blood vessels (arteries). Plaques make the arteries narrower and stiffer. Cholesterol plaques increase your risk for heart attack and stroke. Work with your health care provider to keep your cholesterol levels in a healthy range. What increases the risk? This condition is more likely to develop in people who:  Eat foods that are high in animal fat (saturated fat) or cholesterol.  Are overweight.  Are not getting enough exercise.  Have a family history of high cholesterol. What are the signs or symptoms? There are no symptoms of this condition. How is this diagnosed? This condition may be diagnosed from the results of a blood test.  If you are older than age 32, your health care provider may check your cholesterol every 4-6 years.  You may be checked more often if you already have high cholesterol or other risk factors for heart disease. The blood test for cholesterol measures:  "Bad" cholesterol (LDL cholesterol). This is the main type of cholesterol that causes heart disease. The desired level for LDL is less than 100.  "Good" cholesterol (HDL cholesterol). This type helps to protect against heart disease by cleaning the arteries and carrying the LDL away. The desired level for HDL is 60 or higher.  Triglycerides. These are fats that the body can store or burn for energy. The desired number for triglycerides is lower than 150.  Total cholesterol. This is a measure of the total amount of cholesterol in your blood, including LDL cholesterol, HDL cholesterol, and triglycerides. A healthy number is less than 200. How is this treated? This condition is treated with diet changes, lifestyle changes, and medicines. Diet changes  This may include eating more whole grains, fruits, vegetables, nuts, and fish.  This may also include cutting back on red meat and foods that have a lot of added sugar. Lifestyle changes  Changes may include getting at least 40 minutes of  aerobic exercise 3 times a week. Aerobic exercises include walking, biking, and swimming. Aerobic exercise along with a healthy diet can help you maintain a healthy weight.  Changes may also include quitting smoking. Medicines  Medicines are usually given if diet and lifestyle changes have failed to reduce your cholesterol to healthy levels.  Your health care provider may prescribe a statin medicine. Statin medicines have been shown to reduce cholesterol, which can reduce the risk of heart disease. Follow these instructions at home: Eating and drinking If told by your health care provider:  Eat chicken (without skin), fish, veal, shellfish, ground [place] breast, and round or loin cuts of red meat.  Do not eat fried foods or fatty meats, such as hot dogs and salami.  Eat plenty of fruits, such as apples.  Eat plenty of vegetables, such as broccoli, potatoes, and carrots.  Eat beans, peas, and lentils.  Eat grains such as barley, rice, couscous, and bulgur wheat.  Eat pasta without cream sauces.  Use skim or nonfat milk, and eat low-fat or nonfat yogurt and cheeses.  Do not eat or drink whole milk, cream, ice cream, egg yolks, or hard cheeses.  Do not eat stick margarine or tub margarines that contain trans fats (also called partially hydrogenated oils).  Do not eat saturated tropical oils, such as coconut oil and palm oil.  Do not eat cakes, cookies, crackers, or  other baked goods that contain trans fats.  General instructions  Exercise as directed by your health care provider. Increase your activity level with activities such as gardening, walking, and taking the stairs.  Take over-the-counter and prescription medicines only as told by your health care provider.  Do not use any products that contain nicotine or tobacco, such as cigarettes and e-cigarettes. If you need help quitting, ask your health care provider.  Keep all follow-up visits as told by your health care  provider. This is important. Contact a health care provider if:  You are struggling to maintain a healthy diet or weight.  You need help to start on an exercise program.  You need help to stop smoking. Get help right away if:  You have chest pain.  You have trouble breathing. This information is not intended to replace advice given to you by your health care provider. Make sure you discuss any questions you have with your health care provider. Document Revised: 06/28/2017 Document Reviewed: 12/24/2015 Elsevier Patient Education  [place].

## 2019-11-04 DIAGNOSIS — M25511 Pain in right shoulder: Secondary | ICD-10-CM | POA: Diagnosis not present

## 2019-11-04 DIAGNOSIS — M25611 Stiffness of right shoulder, not elsewhere classified: Secondary | ICD-10-CM | POA: Diagnosis not present

## 2019-11-09 DIAGNOSIS — M25611 Stiffness of right shoulder, not elsewhere classified: Secondary | ICD-10-CM | POA: Diagnosis not present

## 2019-11-09 DIAGNOSIS — M25511 Pain in right shoulder: Secondary | ICD-10-CM | POA: Diagnosis not present

## 2019-11-15 MED FILL — LOSARTAN POTASSIUM 25 MG TA: 25 | 30 days supply | Qty: 60 | Fill #1

## 2019-11-16 DIAGNOSIS — M25511 Pain in right shoulder: Secondary | ICD-10-CM | POA: Diagnosis not present

## 2019-11-16 DIAGNOSIS — M25611 Stiffness of right shoulder, not elsewhere classified: Secondary | ICD-10-CM | POA: Diagnosis not present

## 2019-11-18 DIAGNOSIS — M25611 Stiffness of right shoulder, not elsewhere classified: Secondary | ICD-10-CM | POA: Diagnosis not present

## 2019-11-18 DIAGNOSIS — M25511 Pain in right shoulder: Secondary | ICD-10-CM | POA: Diagnosis not present

## 2019-11-23 DIAGNOSIS — M25511 Pain in right shoulder: Secondary | ICD-10-CM | POA: Diagnosis not present

## 2019-11-23 DIAGNOSIS — M25611 Stiffness of right shoulder, not elsewhere classified: Secondary | ICD-10-CM | POA: Diagnosis not present

## 2019-12-04 DIAGNOSIS — M25511 Pain in right shoulder: Secondary | ICD-10-CM | POA: Diagnosis not present

## 2019-12-04 DIAGNOSIS — M25611 Stiffness of right shoulder, not elsewhere classified: Secondary | ICD-10-CM | POA: Diagnosis not present

## 2019-12-19 DIAGNOSIS — M25511 Pain in right shoulder: Secondary | ICD-10-CM | POA: Diagnosis not present

## 2019-12-29 ENCOUNTER — Telehealth: Payer: PPO

## 2019-12-30 DIAGNOSIS — Z4789 Encounter for other orthopedic aftercare: Secondary | ICD-10-CM | POA: Diagnosis not present

## 2019-12-30 DIAGNOSIS — M25511 Pain in right shoulder: Secondary | ICD-10-CM | POA: Diagnosis not present

## 2019-12-31 ENCOUNTER — Other Ambulatory Visit: Payer: Self-pay | Admitting: Family Medicine

## 2019-12-31 DIAGNOSIS — Z1231 Encounter for screening mammogram for malignant neoplasm of breast: Secondary | ICD-10-CM

## 2020-01-13 MED FILL — AMIODARONE HCL 100 MG TABS: 100 | 90 days supply | Qty: 90 | Fill #1

## 2020-01-13 MED FILL — LATISSE 0.03% EYELASH SOLN: 0.03 | 90 days supply | Qty: 5 | Fill #0

## 2020-01-21 NOTE — Progress Notes (Signed)
Chronic Care Management Pharmacy  Name: Patricia Collier  MRN: [id number] DOB: [birth year]  Chief Complaint/ HPI Patricia Collier,  74 y.o. , female presents for their Follow-Up CCM visit with the clinical pharmacist via telephone due to COVID-19 Pandemic.   Reports exercise has declined and diet worsened in past couple months. Is in the final stages of recovering from shoulder surgery. Walking is not tolerated in warmer months. Now going to [facility] and trying to maintain routine exercise a few times/week.   Describes some increased SOB with exercise or while walking up the stairs. Has appointment scheduled with cardiologist 02/04/2020.   PCP : Patricia Minium, MD.   Their chronic conditions include: CHF, hyperlipidemia, breast cancer.  Office Visits: 11/18 (PCP): hyperlipidemia, TG and LDL elevated; instructed to f/u 08/2019.  Outpatient Encounter Medications as of 01/25/2020  Medication Sig   amiodarone (PACERONE) 100 MG tablet TAKE 1 TABLET BY MOUTH ONCE DAILY   Biotin (BIOTIN 5000) 5 MG CAPS Take by mouth daily.   carvedilol (COREG) 3.125 MG tablet TAKE 1 TABLET (3.125 MG TOTAL) BY MOUTH 2 TIMES DAILY.   Cholecalciferol (VITAMIN D-3) 1000 units CAPS Take 2 capsules by mouth daily.    furosemide (LASIX) 20 MG tablet Take 20 mg by mouth daily as needed.   losartan (COZAAR) 25 MG tablet Take 1 tablet (25 mg total) by mouth 2 (two) times daily.   Magnesium 250 MG TABS Take 1 tablet by mouth daily.    rosuvastatin (CRESTOR) 10 MG tablet Take 1 tablet (10 mg total) by mouth daily.   spironolactone (ALDACTONE) 25 MG tablet TAKE 1/2 TABLETS (12.5 MG TOTAL) BY MOUTH DAILY.   Turmeric 400 MG CAPS Take 400 mg by mouth daily.   No facility-administered encounter medications on file as of 01/25/2020.   Current Diagnosis/Assessment: Goals Addressed            This Visit's Progress    Heart Failure: Minimize recurring symptoms.       CARE PLAN ENTRY  Current Barriers:    Chronic Disease Management support, education, and care coordination needs related to  heart failure. o Currently taking amiodarone 100 mg once daily, carvedilol 3.125 mg twice daily, losartan 25 mg twice daily, spironolactone 12.5 mg once daily, magnesium 400 mg once daily   Pharmacist Clinical Goal(s):   Minimize recurring symptoms of heart failure.   Interventions:  Comprehensive medication review performed.  Patient Self Care Activities:   Patient verbalizes understanding of plan to call with any questions over next three months.  Initial goal documentation.      Hyperlipidemia: LDL <100       CARE PLAN ENTRY (see longitudinal plan of care for additional care plan information)  Current Barriers:   Uncontrolled hyperlipidemia  Current antihyperlipidemic regimen: Diet and Exervise  Previous antihyperlipidemic medications tried: n/a   Most recent lipid panel:     Component Value Date/Time   CHOL 279 (H) 05/27/2019 1337   TRIG 248.0 (H) 05/27/2019 1337   HDL 61.60 05/27/2019 1337   CHOLHDL 5 05/27/2019 1337   VLDL 49.6 (H) 05/27/2019 1337   LDLCALC 162 (H) 07/24/2018 1100   LDLDIRECT 175.0 05/27/2019 1337   10-year ASCVD risk score: 13.6%  Pharmacist Clinical Goal(s):   Over the next 90 days, patient will work with PharmD and providers towards optimized antihyperlipidemic therapy  Interventions:  Comprehensive medication review performed; medication list updated in electronic medical record.   Inter-disciplinary care team collaboration (see longitudinal plan of care)  Patient Self Care Activities:   Over next three months: focus on diet and exercise for cholesterol control. Consider alternative administration of rosuvastatin that we discussed, including rosuvastatin 10 mg 2-3x/week.    Please call with any questions!  Initial goal documentation      Hyperlipidemia   LDL goal < 100     Component Value Date/Time   CHOL 279 (H) 05/27/2019 1337    TRIG 248.0 (H) 05/27/2019 1337   HDL 61.60 05/27/2019 1337   CHOLHDL 5 05/27/2019 1337   VLDL 49.6 (H) 05/27/2019 1337   LDLCALC 162 (H) 07/24/2018 1100   LDLDIRECT 175.0 05/27/2019 1337   The 10-year ASCVD risk score Patricia Collier., et al., 2013) is: 13.6%   Values used to calculate the score:     Age: 83 years     Sex: Female     Is Non-Hispanic African American: No     Diabetic: No     Tobacco smoker: No     Systolic Blood Pressure: 449 mmHg     Is BP treated: Yes     HDL Cholesterol: 61.6 mg/dL     Total Cholesterol: 279 mg/dL   Patient has tried these meds in past: atorvastatin 20 mg.   Reviewed statin recommendations with patient, ongoing concern with memory loss. We discussed the different types of statins and how rosuvastatin would be the least likely to cross BBB into the brain. Patient not wanting to move forward at this time.    We discussed: diet and exercise recommendations. Home BP recommendations.   Patient is currently uncontrolled on the following medications:   Diet and exercise alone  Plan Continue control with diet and exercise  F/u to schedule BP/HLD follow-up with PCP.   Congestive Heart Failure  . Pulse Readings from Last 3 Encounters:  04/22/19 85  08/20/18 61  07/24/18 76   BP Readings from Last 3 Encounters:  05/27/19 122/68  05/27/19 122/68  04/22/19 103/65   Last echo: 05/04/2019. EF 40-45%  Report BP readings have been normal at home 120s/80s, sometimes less. Reports sob with exercise or walking up the stairs. Patient is currently taking the following medications:   Amiodarone 100 mg once daily  Carvedilol 3.125 mg twice daily  Losartan 25 mg twice daily  Spironolactone 12.5 mg once daily  Plan  Continue current medications. Discuss SOB with cardiologist 02/04/2020 visit.   Vaccines   Immunization History  Administered Date(s) Administered   Fluad Quad(high Dose 65+) 04/22/2019   Influenza Inj Mdck Quad With Preservative  04/23/2018   Influenza Split 05/09/2013   Influenza-Unspecified 03/29/2017   PFIZER SARS-COV-2 Vaccination 07/28/2019, 08/17/2019   Pneumococcal Conjugate-13 01/03/2017   Reviewed recommendations for tdap and pneumonia vaccine with patient. Is agreeable tor receive both.   Plan Recommended patient receive tdap in pharmacy and pneumonia at PCP.  Medication Management   Receives prescription medications from:  [place], [place]. [address] [place] [address] Phone: [phone] Fax: [place], [place] [address] [place] [address] Phone: [phone] Fax: [phone]  Denies any issues with current medication management.   Plan  Continue current medication management strategy.  Follow up: 5 month phone visit.  Madelin Rear, Pharm.D., BCGP Clinical Pharmacist [facility] [phone]

## 2020-01-24 MED FILL — CARVEDILOL 3.125 MG TABLET: 3.125 | 90 days supply | Qty: 180 | Fill #1

## 2020-01-24 MED FILL — LOSARTAN POTASSIUM 25 MG TA: 25 | 30 days supply | Qty: 60 | Fill #3

## 2020-01-25 ENCOUNTER — Ambulatory Visit: Payer: PPO

## 2020-01-25 ENCOUNTER — Ambulatory Visit
Admission: RE | Admit: 2020-01-25 | Discharge: 2020-01-25 | Disposition: A | Discharge status: Home / Self Care | Payer: PPO | Source: Ambulatory Visit | Attending: Family Medicine | Admitting: Family Medicine

## 2020-01-25 ENCOUNTER — Other Ambulatory Visit: Payer: Self-pay

## 2020-01-25 DIAGNOSIS — E785 Hyperlipidemia, unspecified: Secondary | ICD-10-CM

## 2020-01-25 DIAGNOSIS — I5022 Chronic systolic (congestive) heart failure: Secondary | ICD-10-CM

## 2020-01-25 DIAGNOSIS — Z1231 Encounter for screening mammogram for malignant neoplasm of breast: Secondary | ICD-10-CM | POA: Diagnosis not present

## 2020-01-25 NOTE — Patient Instructions (Signed)
Please call me at [phone] (direct line) with any questions - thank you!  - Edyth Gunnels., Clinical Pharmacist  Goals Addressed            This Visit's Progress   . Heart Failure: Minimize recurring symptoms.       CARE PLAN ENTRY  Current Barriers:  . Chronic Disease Management support, education, and care coordination needs related to  heart failure. o Currently taking amiodarone 100 mg once daily, carvedilol 3.125 mg twice daily, losartan 25 mg twice daily, spironolactone 12.5 mg once daily, magnesium 400 mg once daily   Pharmacist Clinical Goal(s):  Marland Kitchen Minimize recurring symptoms of heart failure.   Interventions: . Comprehensive medication review performed.  Patient Self Care Activities:  . Patient verbalizes understanding of plan to call with any questions over next three months.  Initial goal documentation.     . Hyperlipidemia: LDL <100       CARE PLAN ENTRY (see longitudinal plan of care for additional care plan information)  Current Barriers:  . Uncontrolled hyperlipidemia . Current antihyperlipidemic regimen: Diet and Exervise . Previous antihyperlipidemic medications tried: n/a  . Most recent lipid panel:     Component Value Date/Time   CHOL 279 (H) 05/27/2019 1337   TRIG 248.0 (H) 05/27/2019 1337   HDL 61.60 05/27/2019 1337   CHOLHDL 5 05/27/2019 1337   VLDL 49.6 (H) 05/27/2019 1337   LDLCALC 162 (H) 07/24/2018 1100   LDLDIRECT 175.0 05/27/2019 1337   10-year ASCVD risk score: 13.6%  Pharmacist Clinical Goal(s):  Marland Kitchen Over the next 90 days, patient will work with PharmD and providers towards optimized antihyperlipidemic therapy  Interventions: . Comprehensive medication review performed; medication list updated in electronic medical record.  Bertram Savin care team collaboration (see longitudinal plan of care)  Patient Self Care Activities:  . Over next three months: focus on diet and exercise for cholesterol control. Consider alternative  administration of rosuvastatin that we discussed, including rosuvastatin 10 mg 2-3x/week.   . Please call with any questions!  Initial goal documentation      Madelin Rear, Pharm.D., BCGP Clinical Pharmacist [facility] [phone] High Cholesterol  High cholesterol is a condition in which the blood has high levels of a white, waxy, fat-like substance (cholesterol). The human body needs small amounts of cholesterol. The liver makes all the cholesterol that the body needs. Extra (excess) cholesterol comes from the food that we eat. Cholesterol is carried from the liver by the blood through the blood vessels. If you have high cholesterol, deposits (plaques) may build up on the walls of your blood vessels (arteries). Plaques make the arteries narrower and stiffer. Cholesterol plaques increase your risk for heart attack and stroke. Work with your health care provider to keep your cholesterol levels in a healthy range. What increases the risk? This condition is more likely to develop in people who:  Eat foods that are high in animal fat (saturated fat) or cholesterol.  Are overweight.  Are not getting enough exercise.  Have a family history of high cholesterol. What are the signs or symptoms? There are no symptoms of this condition. How is this diagnosed? This condition may be diagnosed from the results of a blood test.  If you are older than age 23, your health care provider may check your cholesterol every 4-6 years.  You may be checked more often if you already have high cholesterol or other risk factors for heart disease. The blood test for cholesterol  measures:  "Bad" cholesterol (LDL cholesterol). This is the main type of cholesterol that causes heart disease. The desired level for LDL is less than 100.  "Good" cholesterol (HDL cholesterol). This type helps to protect against heart disease by cleaning the arteries and carrying the LDL away. The  desired level for HDL is 60 or higher.  Triglycerides. These are fats that the body can store or burn for energy. The desired number for triglycerides is lower than 150.  Total cholesterol. This is a measure of the total amount of cholesterol in your blood, including LDL cholesterol, HDL cholesterol, and triglycerides. A healthy number is less than 200. How is this treated? This condition is treated with diet changes, lifestyle changes, and medicines. Diet changes  This may include eating more whole grains, fruits, vegetables, nuts, and fish.  This may also include cutting back on red meat and foods that have a lot of added sugar. Lifestyle changes  Changes may include getting at least 40 minutes of aerobic exercise 3 times a week. Aerobic exercises include walking, biking, and swimming. Aerobic exercise along with a healthy diet can help you maintain a healthy weight.  Changes may also include quitting smoking. Medicines  Medicines are usually given if diet and lifestyle changes have failed to reduce your cholesterol to healthy levels.  Your health care provider may prescribe a statin medicine. Statin medicines have been shown to reduce cholesterol, which can reduce the risk of heart disease. Follow these instructions at home: Eating and drinking If told by your health care provider:  Eat chicken (without skin), fish, veal, shellfish, ground [place] breast, and round or loin cuts of red meat.  Do not eat fried foods or fatty meats, such as hot dogs and salami.  Eat plenty of fruits, such as apples.  Eat plenty of vegetables, such as broccoli, potatoes, and carrots.  Eat beans, peas, and lentils.  Eat grains such as barley, rice, couscous, and bulgur wheat.  Eat pasta without cream sauces.  Use skim or nonfat milk, and eat low-fat or nonfat yogurt and cheeses.  Do not eat or drink whole milk, cream, ice cream, egg yolks, or hard cheeses.  Do not eat stick margarine or tub  margarines that contain trans fats (also called partially hydrogenated oils).  Do not eat saturated tropical oils, such as coconut oil and palm oil.  Do not eat cakes, cookies, crackers, or other baked goods that contain trans fats.  General instructions  Exercise as directed by your health care provider. Increase your activity level with activities such as gardening, walking, and taking the stairs.  Take over-the-counter and prescription medicines only as told by your health care provider.  Do not use any products that contain nicotine or tobacco, such as cigarettes and e-cigarettes. If you need help quitting, ask your health care provider.  Keep all follow-up visits as told by your health care provider. This is important. Contact a health care provider if:  You are struggling to maintain a healthy diet or weight.  You need help to start on an exercise program.  You need help to stop smoking. Get help right away if:  You have chest pain.  You have trouble breathing. This information is not intended to replace advice given to you by your health care provider. Make sure you discuss any questions you have with your health care provider. Document Revised: 06/28/2017 Document Reviewed: 12/24/2015 Elsevier Patient Education  [place].

## 2020-02-04 ENCOUNTER — Ambulatory Visit (HOSPITAL_COMMUNITY)
Admission: RE | Admit: 2020-02-04 | Discharge: 2020-02-04 | Disposition: A | Discharge status: Home / Self Care | Payer: PPO | Source: Ambulatory Visit | Attending: Internal Medicine | Admitting: Internal Medicine

## 2020-02-04 ENCOUNTER — Other Ambulatory Visit: Payer: Self-pay

## 2020-02-04 VITALS — BP 108/68 | HR 90 | Wt 158.0 lb

## 2020-02-04 DIAGNOSIS — R911 Solitary pulmonary nodule: Secondary | ICD-10-CM | POA: Diagnosis not present

## 2020-02-04 DIAGNOSIS — R0600 Dyspnea, unspecified: Secondary | ICD-10-CM | POA: Diagnosis not present

## 2020-02-04 DIAGNOSIS — I350 Nonrheumatic aortic (valve) stenosis: Secondary | ICD-10-CM

## 2020-02-04 DIAGNOSIS — Z79899 Other long term (current) drug therapy: Secondary | ICD-10-CM | POA: Insufficient documentation

## 2020-02-04 DIAGNOSIS — Z923 Personal history of irradiation: Secondary | ICD-10-CM | POA: Diagnosis not present

## 2020-02-04 DIAGNOSIS — R008 Other abnormalities of heart beat: Secondary | ICD-10-CM | POA: Diagnosis not present

## 2020-02-04 DIAGNOSIS — M170 Bilateral primary osteoarthritis of knee: Secondary | ICD-10-CM | POA: Diagnosis not present

## 2020-02-04 DIAGNOSIS — Z9221 Personal history of antineoplastic chemotherapy: Secondary | ICD-10-CM | POA: Insufficient documentation

## 2020-02-04 DIAGNOSIS — Z636 Dependent relative needing care at home: Secondary | ICD-10-CM | POA: Diagnosis not present

## 2020-02-04 DIAGNOSIS — I340 Nonrheumatic mitral (valve) insufficiency: Secondary | ICD-10-CM | POA: Diagnosis not present

## 2020-02-04 DIAGNOSIS — Z853 Personal history of malignant neoplasm of breast: Secondary | ICD-10-CM | POA: Diagnosis not present

## 2020-02-04 DIAGNOSIS — I428 Other cardiomyopathies: Secondary | ICD-10-CM | POA: Diagnosis not present

## 2020-02-04 DIAGNOSIS — Z8249 Family history of ischemic heart disease and other diseases of the circulatory system: Secondary | ICD-10-CM | POA: Insufficient documentation

## 2020-02-04 DIAGNOSIS — I493 Ventricular premature depolarization: Secondary | ICD-10-CM | POA: Diagnosis not present

## 2020-02-04 DIAGNOSIS — I5022 Chronic systolic (congestive) heart failure: Secondary | ICD-10-CM | POA: Insufficient documentation

## 2020-02-04 DIAGNOSIS — Z87891 Personal history of nicotine dependence: Secondary | ICD-10-CM | POA: Diagnosis not present

## 2020-02-04 DIAGNOSIS — Z803 Family history of malignant neoplasm of breast: Secondary | ICD-10-CM | POA: Insufficient documentation

## 2020-02-04 DIAGNOSIS — E785 Hyperlipidemia, unspecified: Secondary | ICD-10-CM | POA: Insufficient documentation

## 2020-02-04 DIAGNOSIS — Z885 Allergy status to narcotic agent status: Secondary | ICD-10-CM | POA: Diagnosis not present

## 2020-02-04 DIAGNOSIS — R002 Palpitations: Secondary | ICD-10-CM | POA: Insufficient documentation

## 2020-02-04 DIAGNOSIS — G4733 Obstructive sleep apnea (adult) (pediatric): Secondary | ICD-10-CM | POA: Diagnosis not present

## 2020-02-04 LAB — COMPREHENSIVE METABOLIC PANEL
ALT: 14 U/L (ref 0–44)
AST: 19 U/L (ref 15–41)
Albumin: 4.2 g/dL (ref 3.5–5.0)
Alkaline Phosphatase: 100 U/L (ref 38–126)
Anion gap: 9 (ref 5–15)
BUN: 23 mg/dL (ref 8–23)
CO2: 21 mmol/L — ABNORMAL LOW (ref 22–32)
Calcium: 9.4 mg/dL (ref 8.9–10.3)
Chloride: 107 mmol/L (ref 98–111)
Creatinine, Ser: 0.78 mg/dL (ref 0.44–1.00)
GFR calc Af Amer: >60 mL/min (ref >60)
GFR calc non Af Amer: >60 mL/min (ref >60)
Glucose, Bld: 131 mg/dL — ABNORMAL HIGH (ref 70–99)
Potassium: 3.9 mmol/L (ref 3.5–5.1)
Sodium: 137 mmol/L (ref 135–145)
Total Bilirubin: 0.4 mg/dL (ref 0.3–1.2)
Total Protein: 6.7 g/dL (ref 6.5–8.1)

## 2020-02-04 LAB — BRAIN NATRIURETIC PEPTIDE: B Natriuretic Peptide: 25.5 pg/mL (ref 0.0–100.0)

## 2020-02-04 LAB — SEDIMENTATION RATE: Sed Rate: 11 mm/hr (ref 0–22)

## 2020-02-04 MED ORDER — IVABRADINE HCL 5 MG PO TABS: 5.0000 mg | ORAL_TABLET | Freq: Two times a day (BID) | ORAL | 6 refills | Status: DC

## 2020-02-04 NOTE — Patient Instructions (Signed)
Start Ivabradine 5 mg Twice daily   Labs done today, your results will be available in MyChart, we will contact you for abnormal readings.  Chest X-ray needed today  Your physician has requested that you have an echocardiogram. Echocardiography is a painless test that uses sound waves to create images of your heart. It provides your doctor with information about the size and shape of your heart and how well your heart's chambers and valves are working. This procedure takes approximately one hour. There are no restrictions for this procedure.  Your physician recommends that you schedule a follow-up appointment in: 3-4 months  If you have any questions or concerns before your next appointment please send [place] a message through [place] or call our office at [phone].    TO LEAVE A MESSAGE FOR THE NURSE SELECT OPTION 2, PLEASE LEAVE A MESSAGE INCLUDING: . YOUR NAME . DATE OF BIRTH . CALL BACK NUMBER . REASON FOR CALL**this is important as we prioritize the call backs  [place] AS LONG AS YOU CALL BEFORE 4:00 PM  At the [facility], you and your health needs are our priority. As part of our continuing mission to provide you with exceptional heart care, we have created designated Provider Care Teams. These Care Teams include your primary Cardiologist (physician) and Advanced Practice Providers (APPs- Physician Assistants and Nurse Practitioners) who all work together to provide you with the care you need, when you need it.   You may see any of the following providers on your designated Care Team at your next follow up: Marland Kitchen Dr Glori Bickers . Dr Loralie Champagne . Darrick Grinder, NP . Lyda Jester, PA . Audry Riles, PharmD   Please be sure to bring in all your medications bottles to every appointment.

## 2020-02-04 NOTE — Progress Notes (Signed)
Advanced Heart Failure Clinic Note   Primary Cardiologist: Dr. 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 She has been very active and continues to work as Immunologist at [employer].   Admitted 02/04/17-02/07/17 with new onset acute systolic CHF. EF 15-20%. Right and left heart cath done and showed Fick output/index 3.1/1.8. No CAD. She was started on losartan, Spiro and digoxin. Discharge weight was 140 pounds.   Holter monitor 8/18: Multiform PVCs, couplets, and triplets were noted with periods of bigeminy. PVC burden < 1%.No VT observed.  Echo 9/19: EF 45-50% moderate AS mean gradient 29mHG  She returns today for HF follow up. She feels much more SOB lately. Resting HR has gone up form 70-> 90s. SOB with walking especially in the heat. Was abe to play golf once last week. Lost 15 pounds but gained it back. No edema, orthopnea or PND. Compliant with meds. Not wearing CPAP.    CPX 01/2018 Peak VO2: 21.5 (108% predicted peak VO2) VE/VCO2 slope: 48 OUES: 1.84 Peak RER: 0.93  Echo 05/09/17 EF ~30% RV ok. Personally reviewed  Echo 02/05/17 LVEF 15-20%, Grade 1 DD, Mild/Mod central MR, Mild LAE  R/LHC 02/06/17  There is severe left ventricular systolic dysfunction.  Dist Cx lesion, 20 %stenosed.  1st Mrg lesion, 60 %stenosed.  Dist RCA lesion, 20 %stenosed.  Mid LAD to Dist LAD lesion, 20 %stenosed.   Findings: Ao = 80/56 (68) LV = 86/14 RA = 2  RV = 22/2 PA = 24/11 (17) PCW = 6 Fick cardiac output/index = 3.1/1.8 PVR = 3.6 WU Ao sat = 94% PA sat = 58%, 58%  Cardiac MRI 06/2017 1.  Moderate LVE with diffuse hypokinesis EF 35% 2. Small area of apical hyperenhancement on delayed gadolinium images 3.  Mild MR 4.  Mild  LAE 5. Thickened tri leaflet AV with restricted leaflet motion suggest echo for AS correlation 6.  Normal RV size and function 7.  Normal aortic root 3.0 cm  Review of systems complete and found to be negative unless listed in HPI.   Past Medical History:  Diagnosis Date   Arthritis    ostearthritis -joints, greater left knee.   [place] breast [place]   S/P surgery, radiation, chemotherapy.-no further oncology visits.   CHF (congestive heart [place]) ([place] dx[place] 02/05/2017   Hyperlipidemia    Neuromuscular disorder (HCC)    neuropathy due to chemotherapy   Personal history of chemotherapy 2009   Personal history of radiation therapy 2009   Seizures ([place]    "childbirth related"   SVT ([place]ricular [place]cardia) [place]    x1 episode -3 yrs ago- no issues since.All heart testing negative. No further cardiac visits needed.   Current Outpatient Medications  Medication Sig Dispense Refill   amiodarone (PACERONE) 100 MG tablet TAKE 1 TABLET BY MOUTH ONCE DAILY 90 tablet 3   Biotin (BIOTIN 5000) 5 MG CAPS Take by mouth daily.     carvedilol (COREG) 3.125 MG tablet TAKE 1 TABLET (3.125 MG TOTAL) BY MOUTH 2 TIMES DAILY. 180 tablet 3   Cholecalciferol (VITAMIN D-3) 1000 units CAPS Take 2 capsules by mouth daily.      furosemide (LASIX) 20 MG tablet Take 20 mg by mouth daily as needed.  losartan (COZAAR) 25 MG tablet Take 1 tablet (25 mg total) by mouth 2 (two) times daily. 60 tablet 3  °• Magnesium 250 MG TABS Take 1 tablet by mouth daily.     °• spironolactone (ALDACTONE) 25 MG tablet TAKE 1/2 TABLETS (12.5 MG TOTAL) BY MOUTH DAILY. 45 tablet 1  ° °No current facility-administered medications for this encounter.  ° °Allergies  °Allergen Reactions  °• Effexor [Venlafaxine] Other (See Comments)  °  Caused depression, negative thoughts, and crying   °• Hydrocodone-Acetaminophen Itching and Nausea And Vomiting  °  Patient can tolerate with Zofran  °  °Social History   ° °Socioeconomic History  °• Marital status: Married  °  Spouse name: Not on file  °• Number of children: 2  °• Years of education: Not on file  °• Highest education level: Not on file  °Occupational History  °• Occupation: anesthetist  °  Employer: [employer]  °  Comment: retired June 2019  °Tobacco Use  °• Smoking status: Former Smoker  °  Packs/day: 2.00  °  Years: 10.00  °  Pack years: 20.00  °  Types: Cigarettes  °  Quit date: 07/09/1989  °  Years since quitting: 30.5  °• Smokeless tobacco: Never Used  °Vaping Use  °• Vaping Use: Never used  °Substance and Sexual Activity  °• Alcohol use: No  °• Drug use: No  °• Sexual activity: Yes  °Other Topics Concern  °• Not on file  °Social History Narrative  ° Patient with caregiver stress due to spouses declining health  ° Some conflicts with children and addictions (coping well)   ° °Social Determinants of Health  ° °Financial Resource Strain:   °• Difficulty of Paying Living Expenses:   °Food Insecurity: No Food Insecurity  °• Worried About Running Out of Food in the Last Year: Never true  °• Ran Out of Food in the Last Year: Never true  °Transportation Needs: No Transportation Needs  °• Lack of Transportation (Medical): No  °• Lack of Transportation (Non-Medical): No  °Physical Activity: Sufficiently Active  °• Days of Exercise per Week: 6 days  °• Minutes of Exercise per Session: 60 min  °Stress: Stress Concern Present  °• Feeling of Stress : Rather much  °Social Connections:   °• Frequency of Communication with Friends and Family:   °• Frequency of Social Gatherings with Friends and Family:   °• Attends Religious Services:   °• Active Member of Clubs or Organizations:   °• Attends Club or Organization Meetings:   °• Marital Status:   °Intimate Partner Violence:   °• Fear of Current or Ex-Partner:   °• Emotionally Abused:   °• Physically Abused:   °• Sexually Abused:   ° °Family History  °Problem Relation Age of Onset  °• Heart disease Mother   °• Hypertension  Father   °• Breast cancer Sister   °• Lung cancer Sister   °• Skin cancer Brother   ° °Vitals:  ° 02/04/20 1359  °BP: 108/68  °Pulse: 90  °SpO2: 99%  °Weight: 71.7 kg (158 lb)  ° °Wt Readings from Last 3 Encounters:  °02/04/20 71.7 kg (158 lb)  °05/27/19 65.6 kg (144 lb 9.6 oz)  °05/27/19 65.6 kg (144 lb 9.6 oz)  °  °PHYSICAL EXAM: °General:  Well appearing. No resp difficulty °HEENT: normal °Neck: supple. no JVD. Carotids 2+ bilat; no bruits. No lymphadenopathy or thryomegaly appreciated. °Cor: PMI nondisplaced. Regular rate & rhythm. 2/6 AS °Lungs: clear °  Abdomen: soft, nontender, nondistended. No hepatosplenomegaly. No bruits or masses. Good bowel sounds. Extremities: no cyanosis, clubbing, rash, edema Neuro: alert & orientedx3, cranial nerves grossly intact. moves all 4 extremities w/o difficulty. Affect pleasant   ECG: NSR 94 LVH LAFB Personally reviewed   Bedside echo limited images EF 50-55% Personally reviewed  ASSESSMENT & PLAN:  1. Chronic systolic CHF: NICM, EF 00-71%, felt to be related to doxorubicin therapy. But also with frequent PVC's, holter monitor showed 1% PVC's but patient complained of very frequent PVC's.  - Echo 05/2017 ~30-35%. LV size - Echo 03/09/18 EF 45-50% - CPX 01/2018 Peak VO2: 21.5 (108% predicted peak VO2) VE/VCO2 slope: 48 Peak RER: 0.93 - Echo 10/20 EF 40-45% mild to moderate AS - Worse NYHA II-III - Bedside echo today EF 50-55% - Volume looks good. Suspect dyspnea possibly related to recurrent tachycardia. Will restart ivabradine 5 bid which helped before. Will also check CXR and ESR to exclude amio toxicity - Continue lasix 20 mg as needed.  - Continue Spiro 12.5 mg daily. Did not tolerate higher dose. - Continue losartan 25 mg bid - Continue amiodarone 100 mg daily for now.  - Continue carvedilol 3.125 mg BID - Repeat formal echo   2. Palpitations/Frequent PVCs - Holter monitor 03/2017 with PVC burden <1%. - Continue amiodarone 100 mg daily. Recheck  CMET and TFTs.   3. Mitral Regurgitation - Mild on cMRI 07/07/2017. No change.   4. Pulmonary nodule - Stable on f/u CT 11/18. No change.   5. Aortic stenosis - moderate by echo in 9/19.  - echo 10/20 AoV mean 15 AVA 0.99 (mild to moderate AS) - repeat echo   6. OSA - Encouraged her to wear CPAP   Glori Bickers, MD  2:17 PM

## 2020-02-04 NOTE — Addendum Note (Signed)
Encounter addended by: Scarlette Calico, RN on: 02/04/2020 3:02 PM  Actions taken: Order list changed, Diagnosis association updated, Clinical Note Signed, Charge Capture section accepted

## 2020-02-10 ENCOUNTER — Other Ambulatory Visit: Payer: Self-pay

## 2020-02-10 ENCOUNTER — Ambulatory Visit (HOSPITAL_COMMUNITY)
Admission: RE | Admit: 2020-02-10 | Discharge: 2020-02-10 | Disposition: A | Discharge status: Home / Self Care | Payer: PPO | Source: Ambulatory Visit | Attending: Internal Medicine | Admitting: Internal Medicine

## 2020-02-10 DIAGNOSIS — I340 Nonrheumatic mitral (valve) insufficiency: Secondary | ICD-10-CM | POA: Diagnosis not present

## 2020-02-10 DIAGNOSIS — I5022 Chronic systolic (congestive) heart failure: Secondary | ICD-10-CM | POA: Diagnosis not present

## 2020-02-10 DIAGNOSIS — Z853 Personal history of malignant neoplasm of breast: Secondary | ICD-10-CM | POA: Diagnosis not present

## 2020-02-10 DIAGNOSIS — Z87891 Personal history of nicotine dependence: Secondary | ICD-10-CM | POA: Insufficient documentation

## 2020-02-10 NOTE — Progress Notes (Signed)
  Echocardiogram 2D Echocardiogram has been performed.  [name] 02/10/2020, 2:05 PM

## 2020-02-11 ENCOUNTER — Telehealth (HOSPITAL_COMMUNITY): Payer: Self-pay | Admitting: Pharmacy Technician

## 2020-02-11 LAB — ECHOCARDIOGRAM COMPLETE
AR max vel: 1.77 cm2
AV Area VTI: 1.48 cm2
AV Area mean vel: 1.64 cm2
AV Mean grad: 18 mmHg
AV Peak grad: 28.9 mmHg
Ao pk vel: 2.69 m/s
Area-P 1/2: 2.34 cm2
Calc EF: 52.1 %
S' Lateral: 2.9 cm
Single Plane A2C EF: 47.1 %
Single Plane A4C EF: 49.3 %

## 2020-02-11 NOTE — Telephone Encounter (Signed)
Patient Advocate Encounter   Received notification from [employer] that prior authorization for Corlanor is required.   PA submitted on CoverMyMeds Key [id number] Status is pending   Will continue to follow.

## 2020-02-12 NOTE — Telephone Encounter (Signed)
Advanced Heart Failure Patient Advocate Encounter  Prior Authorization for Corlanor has been approved.     Effective dates: 02/11/20 through 02/10/21  Patients co-pay is $90.00  Called and left patient a message to return my call. Will seek [employer] assistance if the co-pay is unaffordable.

## 2020-02-12 NOTE — Telephone Encounter (Signed)
Spoke with patient, will email her the assistance application.  Will follow up.

## 2020-02-15 ENCOUNTER — Other Ambulatory Visit (HOSPITAL_COMMUNITY): Payer: Self-pay | Admitting: Internal Medicine

## 2020-02-15 ENCOUNTER — Telehealth (HOSPITAL_COMMUNITY): Payer: Self-pay | Admitting: Pharmacist

## 2020-02-15 MED ORDER — IVABRADINE HCL 5 MG PO TABS: 5.0000 mg | ORAL_TABLET | Freq: Two times a day (BID) | ORAL | 3 refills | Status: DC

## 2020-02-15 MED FILL — CORLANOR 5 MG TABLET: 5 | 90 days supply | Qty: 180 | Fill #0

## 2020-02-15 NOTE — Telephone Encounter (Signed)
Patient sent a message asking for a 90 day prescription of Corlanor to be sent to [facility]. Her 90 day co-pay is $180 versus $90 30 day co-pay. Messaged Lauren to have that sent over for her.

## 2020-02-15 NOTE — Telephone Encounter (Signed)
Corlanor prescription sent to [facility] per patient request.

## 2020-02-18 ENCOUNTER — Encounter: Payer: Self-pay | Admitting: Genetic Counselor

## 2020-02-27 ENCOUNTER — Other Ambulatory Visit (HOSPITAL_COMMUNITY): Payer: Self-pay | Admitting: Internal Medicine

## 2020-02-29 ENCOUNTER — Other Ambulatory Visit (HOSPITAL_COMMUNITY): Payer: Self-pay | Admitting: Internal Medicine

## 2020-02-29 MED FILL — LOSARTAN POTASSIUM 25 MG TA: 25 | 30 days supply | Qty: 60 | Fill #0

## 2020-02-29 MED FILL — SPIRONOLACTONE 25 MG TABS: 25 | 90 days supply | Qty: 45 | Fill #0

## 2020-04-04 MED FILL — LOSARTAN POTASSIUM 25 MG TA: 25 | 30 days supply | Qty: 60 | Fill #1

## 2020-04-14 ENCOUNTER — Telehealth: Payer: Self-pay

## 2020-04-14 NOTE — Progress Notes (Signed)
Chronic Care Management Pharmacy Assistant   Name: Patricia Collier  MRN: [id number] DOB: [birth year]  Reason for Encounter: Disease State    PCP : [name], MD  Allergies:   Allergies  Allergen Reactions   Effexor [Venlafaxine] Other (See Comments)    Caused depression, negative thoughts, and crying    Hydrocodone-Acetaminophen Itching and Nausea And Vomiting    Patient can tolerate with Zofran    Medications: Outpatient Encounter Medications as of 04/14/2020  Medication Sig   amiodarone (PACERONE) 100 MG tablet TAKE 1 TABLET BY MOUTH ONCE DAILY   Biotin (BIOTIN 5000) 5 MG CAPS Take by mouth daily.   carvedilol (COREG) 3.125 MG tablet TAKE 1 TABLET (3.125 MG TOTAL) BY MOUTH 2 TIMES DAILY.   Cholecalciferol (VITAMIN D-3) 1000 units CAPS Take 2 capsules by mouth daily.    furosemide (LASIX) 20 MG tablet Take 20 mg by mouth daily as needed.   ivabradine (CORLANOR) 5 MG TABS tablet Take 1 tablet (5 mg total) by mouth 2 (two) times daily with a meal.   losartan (COZAAR) 25 MG tablet TAKE 1 TABLET BY MOUTH TWO TIMES DAILY   Magnesium 250 MG TABS Take 1 tablet by mouth daily.    spironolactone (ALDACTONE) 25 MG tablet TAKE 1/2 TABLETS (12.5 MG TOTAL) BY MOUTH DAILY.   No facility-administered encounter medications on file as of 04/14/2020.    Current Diagnosis: Patient Active Problem List   Diagnosis Date Noted   Palpitations 05/09/2017   Mitral regurgitation 05/09/2017   Cardiomegaly 02/04/2017   Pulmonary nodule 02/04/2017   CHF (congestive heart failure) ([place]) 02/04/2017   Physical exam 01/03/2017   Lateral meniscal tear 09/21/2015   Inadequate sleep hygiene 11/20/2013   Knee pain, left 11/16/2013   Abnormal EKG 05/28/2012   History of left breast cancer 11/19/2011   Hyperlipidemia 01/23/2007     Reviewed chart prior to disease state call. Spoke with patient regarding BP  Recent Office Vitals: BP Readings from Last 3  Encounters:  02/04/20 108/68  05/27/19 122/68  05/27/19 122/68   Pulse Readings from Last 3 Encounters:  02/04/20 90  04/22/19 85  08/20/18 61    Wt Readings from Last 3 Encounters:  02/04/20 158 lb (71.7 kg)  05/27/19 144 lb 9.6 oz (65.6 kg)  05/27/19 144 lb 9.6 oz (65.6 kg)     Kidney Function Lab Results  Component Value Date/Time   CREATININE 0.78 02/04/2020 03:28 PM   CREATININE 0.77 05/27/2019 01:37 PM   CREATININE 0.8 11/11/2012 09:20 AM   GFR 73.91 05/27/2019 01:37 PM   GFRNONAA >60 02/04/2020 03:28 PM   GFRAA >60 02/04/2020 03:28 PM    BMP Latest Ref Rng & Units 02/04/2020 05/27/2019 04/22/2019  Glucose 70 - 99 mg/dL 131(H) 85 100(H)  BUN 8 - 23 mg/dL 23 19 21   Creatinine 0.44 - 1.00 mg/dL 0.78 0.77 0.82  Sodium 135 - 145 mmol/L 137 136 138  Potassium 3.5 - 5.1 mmol/L 3.9 4.6 4.5  Chloride 98 - 111 mmol/L 107 102 106  CO2 22 - 32 mmol/L 21(L) 26 21(L)  Calcium 8.9 - 10.3 mg/dL 9.4 9.4 9.5     Current antihypertensive regimen:  carvedilol (COREG) 3.125 MG tablet losartan (COZAAR) 25 MG tablet  How often are you checking your Blood Pressure? Patient states she does not have blood pressure problems . Patient states she has heart failure.  Current home BP readings: N/A  What recent interventions/DTPs have been made by any provider  to improve Blood Pressure control since last CPP Visit: None Noted  Any recent hospitalizations or ED visits since last visit with CPP? No  What diet changes have been made to improve Blood Pressure Control?  o Patient states she eats what she wants, patient states she feels her weight is acceptable.   What exercise is being done to improve your Blood Pressure Control?  o Patient states she sees trainer twice a week and goes walking and plays golf.  Adherence Review: Is the patient currently on ACE/ARB medication? Yes Does the patient have >5 day gap between last estimated fill dates? No   [place] ,[place] Clinical Pharmacist Assistant [phone]   Follow-Up:  Pharmacist Review

## 2020-04-15 ENCOUNTER — Other Ambulatory Visit: Payer: Self-pay

## 2020-04-15 ENCOUNTER — Ambulatory Visit (INDEPENDENT_AMBULATORY_CARE_PROVIDER_SITE_OTHER): Payer: PPO

## 2020-04-15 DIAGNOSIS — Z23 Encounter for immunization: Secondary | ICD-10-CM | POA: Diagnosis not present

## 2020-04-18 ENCOUNTER — Telehealth (INDEPENDENT_AMBULATORY_CARE_PROVIDER_SITE_OTHER): Payer: PPO | Admitting: Family Medicine

## 2020-04-18 ENCOUNTER — Encounter: Payer: Self-pay | Admitting: Family Medicine

## 2020-04-18 ENCOUNTER — Other Ambulatory Visit: Payer: Self-pay

## 2020-04-18 ENCOUNTER — Other Ambulatory Visit: Payer: Self-pay | Admitting: Family Medicine

## 2020-04-18 DIAGNOSIS — R350 Frequency of micturition: Secondary | ICD-10-CM

## 2020-04-18 MED ORDER — CEPHALEXIN 500 MG PO CAPS: 500.0000 mg | ORAL_CAPSULE | Freq: Two times a day (BID) | ORAL | 0 refills | Status: DC

## 2020-04-18 MED FILL — CEPHALEXIN 500 MG CAPSULE: 500 | 5 days supply | Qty: 10 | Fill #0

## 2020-04-18 NOTE — Progress Notes (Signed)
Virtual Visit via Video   I connected with patient on 04/18/20 at  1:00 PM EDT by a video enabled telemedicine application and verified that I am speaking with the correct person using two identifiers.  Location patient: Home Location provider: Acupuncturist, Office Persons participating in the virtual visit: Patient, Provider, [name]  I discussed the limitations of evaluation and management by telemedicine and the availability of in person appointments. The patient expressed understanding and agreed to proceed.  Subjective:   HPI:   Urine frequency- 'I think I might have a urinary tract infxn'.  + LBP, no flank pain.  + suprapubic pressure.  + urgency and frequency.  Has had some leakage.  + nausea, no fever.  sxs started ~3 days ago.  ROS:   See pertinent positives and negatives per HPI.  Patient Active Problem List   Diagnosis Date Noted  . Palpitations 05/09/2017  . Mitral regurgitation 05/09/2017  . Cardiomegaly 02/04/2017  . Pulmonary nodule 02/04/2017  . CHF (congestive heart failure) ([place]) 02/04/2017  . Physical exam 01/03/2017  . Abnormal EKG 05/28/2012  . History of left breast cancer 11/19/2011  . Hyperlipidemia 01/23/2007    Social History   Tobacco Use  . Smoking status: Former Smoker    Packs/day: 2.00    Years: 10.00    Pack years: 20.00    Types: Cigarettes    Quit date: 07/09/1989    Years since quitting: 30.7  . Smokeless tobacco: Never Used  Substance Use Topics  . Alcohol use: No    Current Outpatient Medications:  .  amiodarone (PACERONE) 100 MG tablet, TAKE 1 TABLET BY MOUTH ONCE DAILY, Disp: 90 tablet, Rfl: 3 .  Biotin (BIOTIN 5000) 5 MG CAPS, Take by mouth daily., Disp: , Rfl:  .  carvedilol (COREG) 3.125 MG tablet, TAKE 1 TABLET (3.125 MG TOTAL) BY MOUTH 2 TIMES DAILY., Disp: 180 tablet, Rfl: 3 .  Cholecalciferol (VITAMIN D-3) 1000 units CAPS, Take 2 capsules by mouth daily. , Disp: , Rfl:  .  furosemide (LASIX) 20 MG tablet,  Take 20 mg by mouth daily as needed., Disp: , Rfl:  .  ivabradine (CORLANOR) 5 MG TABS tablet, Take 1 tablet (5 mg total) by mouth 2 (two) times daily with a meal., Disp: 180 tablet, Rfl: 3 .  losartan (COZAAR) 25 MG tablet, TAKE 1 TABLET BY MOUTH TWO TIMES DAILY, Disp: 60 tablet, Rfl: 11 .  Magnesium 250 MG TABS, Take 1 tablet by mouth daily. , Disp: , Rfl:  .  spironolactone (ALDACTONE) 25 MG tablet, TAKE 1/2 TABLETS (12.5 MG TOTAL) BY MOUTH DAILY., Disp: 45 tablet, Rfl: 3  Allergies  Allergen Reactions  . Effexor [Venlafaxine] Other (See Comments)    Caused depression, negative thoughts, and crying   . Hydrocodone-Acetaminophen Itching and Nausea And Vomiting    Patient can tolerate with Zofran    Objective:   There were no vitals taken for this visit. AAOx3, NAD NCAT, EOMI No obvious CN deficits Coloring WNL Pt is able to speak clearly, coherently without shortness of breath or increased work of breathing.  Thought process is linear.  Mood is appropriate.   Assessment and Plan:   Urinary Frequency- pt's sxs are highly suspicious for UTI.  Start Keflex twice daily.  Pt was instructed to let me know if things don't improve b/c at that time, we would need a culture.  Pt expressed understanding and is in agreement w/ plan.    Annye Asa, MD  04/18/2020   

## 2020-04-18 NOTE — Progress Notes (Signed)
I have discussed the procedure for the virtual visit with the patient who has given consent to proceed with assessment and treatment.   Pt unable to obtain vitals.   Patricia Collier, CMA     

## 2020-04-20 MED FILL — AMIODARONE HCL 100 MG TABS: 100 | 90 days supply | Qty: 90 | Fill #2

## 2020-05-02 ENCOUNTER — Encounter (HOSPITAL_COMMUNITY): Payer: PPO | Admitting: Internal Medicine

## 2020-05-02 ENCOUNTER — Telehealth (HOSPITAL_COMMUNITY): Payer: Self-pay

## 2020-05-02 NOTE — Telephone Encounter (Signed)
Patient called to report that she has been experiencing some pvcs and chest pressure for the last 1-2 weeks. She reports that the chest pressure seems to be more constant then the pvcs but both comes and goes. Patricia Collier denies arm & back pain, sob, headaches, no caffeine intake but does report that she is stressed due to her husband not doing well. Patricia Collier also reports that compared to July 2021 she feels much better and is still very physically active but is concerned about the pvcs especially since she is suppose to go out of town 05/20/20. Please advise.

## 2020-05-03 MED FILL — LOSARTAN POTASSIUM 25 MG TA: 25 | 30 days supply | Qty: 60 | Fill #2

## 2020-05-03 MED FILL — CARVEDILOL 3.125 MG TABLET: 3.125 | 90 days supply | Qty: 180 | Fill #2

## 2020-05-03 NOTE — Telephone Encounter (Signed)
Patient advised and verbalized understanding. Nurse visit scheduled

## 2020-05-03 NOTE — Telephone Encounter (Signed)
Place 14 day zio

## 2020-05-04 ENCOUNTER — Ambulatory Visit (HOSPITAL_COMMUNITY)
Admission: RE | Admit: 2020-05-04 | Discharge: 2020-05-04 | Disposition: A | Discharge status: Home / Self Care | Payer: PPO | Source: Ambulatory Visit | Attending: Cardiology | Admitting: Cardiology

## 2020-05-04 ENCOUNTER — Other Ambulatory Visit: Payer: Self-pay

## 2020-05-04 DIAGNOSIS — R002 Palpitations: Secondary | ICD-10-CM

## 2020-05-19 DIAGNOSIS — Z20822 Contact with and (suspected) exposure to covid-19: Secondary | ICD-10-CM | POA: Diagnosis not present

## 2020-05-27 DIAGNOSIS — I493 Ventricular premature depolarization: Secondary | ICD-10-CM | POA: Diagnosis not present

## 2020-06-03 MED FILL — LOSARTAN POTASSIUM 25 MG TA: 25 | 30 days supply | Qty: 60 | Fill #3

## 2020-06-03 MED FILL — SPIRONOLACTONE 25 MG TABS: 25 | 90 days supply | Qty: 45 | Fill #1

## 2020-06-03 MED FILL — CORLANOR 5 MG TABLET: 5 | 90 days supply | Qty: 180 | Fill #1

## 2020-06-06 ENCOUNTER — Encounter: Payer: PPO | Admitting: Family Medicine

## 2020-06-13 ENCOUNTER — Ambulatory Visit (INDEPENDENT_AMBULATORY_CARE_PROVIDER_SITE_OTHER): Payer: PPO | Admitting: Family Medicine

## 2020-06-13 ENCOUNTER — Other Ambulatory Visit (INDEPENDENT_AMBULATORY_CARE_PROVIDER_SITE_OTHER): Payer: PPO

## 2020-06-13 ENCOUNTER — Encounter: Payer: Self-pay | Admitting: Family Medicine

## 2020-06-13 ENCOUNTER — Other Ambulatory Visit: Payer: Self-pay

## 2020-06-13 VITALS — BP 112/70 | HR 64 | Temp 97.8°F | Resp 17 | Ht 63.0 in | Wt 159.6 lb

## 2020-06-13 DIAGNOSIS — E785 Hyperlipidemia, unspecified: Secondary | ICD-10-CM

## 2020-06-13 DIAGNOSIS — Z0000 Encounter for general adult medical examination without abnormal findings: Secondary | ICD-10-CM

## 2020-06-13 DIAGNOSIS — Z23 Encounter for immunization: Secondary | ICD-10-CM | POA: Diagnosis not present

## 2020-06-13 LAB — HEPATIC FUNCTION PANEL
ALT: 12 U/L (ref 0–35)
AST: 17 U/L (ref 0–37)
Albumin: 4.4 g/dL (ref 3.5–5.2)
Alkaline Phosphatase: 106 U/L (ref 39–117)
Bilirubin, Direct: 0.1 mg/dL (ref 0.0–0.3)
Total Bilirubin: 0.6 mg/dL (ref 0.2–1.2)
Total Protein: 6.6 g/dL (ref 6.0–8.3)

## 2020-06-13 LAB — CBC WITH DIFFERENTIAL/PLATELET
Basophils Absolute: 0.1 10*3/uL (ref 0.0–0.1)
Basophils Relative: 1.1 % (ref 0.0–3.0)
Eosinophils Absolute: 0.1 10*3/uL (ref 0.0–0.7)
Eosinophils Relative: 2.5 % (ref 0.0–5.0)
HCT: 42.2 % (ref 36.0–46.0)
Hemoglobin: 14.3 g/dL (ref 12.0–15.0)
Lymphocytes Relative: 20.4 % (ref 12.0–46.0)
Lymphs Abs: 1 10*3/uL (ref 0.7–4.0)
MCHC: 33.9 g/dL (ref 30.0–36.0)
MCV: 92.2 fl (ref 78.0–100.0)
Monocytes Absolute: 0.4 10*3/uL (ref 0.1–1.0)
Monocytes Relative: 8.1 % (ref 3.0–12.0)
Neutro Abs: 3.3 10*3/uL (ref 1.4–7.7)
Neutrophils Relative %: 67.9 % (ref 43.0–77.0)
Platelets: 229 10*3/uL (ref 150.0–400.0)
RBC: 4.57 Mil/uL (ref 3.87–5.11)
RDW: 13.7 % (ref 11.5–15.5)
WBC: 4.9 10*3/uL (ref 4.0–10.5)

## 2020-06-13 LAB — LIPID PANEL
Cholesterol: 275 mg/dL — ABNORMAL HIGH (ref 0–200)
HDL: 54.1 mg/dL (ref >39.00)
LDL Cholesterol: 188 mg/dL — ABNORMAL HIGH (ref 0–99)
NonHDL: 221.33
Total CHOL/HDL Ratio: 5
Triglycerides: 166 mg/dL — ABNORMAL HIGH (ref 0.0–149.0)
VLDL: 33.2 mg/dL (ref 0.0–40.0)

## 2020-06-13 LAB — BASIC METABOLIC PANEL
BUN: 15 mg/dL (ref 6–23)
CO2: 27 mEq/L (ref 19–32)
Calcium: 9.2 mg/dL (ref 8.4–10.5)
Chloride: 106 mEq/L (ref 96–112)
Creatinine, Ser: 0.74 mg/dL (ref 0.40–1.20)
GFR: 81.08 mL/min (ref >60.00)
Glucose, Bld: 101 mg/dL — ABNORMAL HIGH (ref 70–99)
Potassium: 4 mEq/L (ref 3.5–5.1)
Sodium: 141 mEq/L (ref 135–145)

## 2020-06-13 LAB — TSH: TSH: 3.24 u[IU]/mL (ref 0.35–4.50)

## 2020-06-13 NOTE — Progress Notes (Signed)
Subjective:    Patient ID: Patricia Collier, female    DOB: 1946/01/06, 74 y.o.   MRN: [id number]  HPI CPE- UTD on colonoscopy, mammo, Prevnar, flu, COVID.  Due for Pneumovax.  Pt has gained 15 lbs since last visit, having CP  Reviewed past medical, surgical, family and social histories.   Patient Care Team    Relationship Specialty Notifications Start End  [name], MD PCP - General Family Medicine  05/28/12    Comment: Alfonse Flavors, Elaina Hoops, MD Consulting Physician Obstetrics and Gynecology  01/03/17   Bensimhon, Shaune Pascal, MD Consulting Physician Cardiology  05/27/19   Ulla Gallo, MD Consulting Physician Dermatology  05/27/19   Madelin Rear, [facility] Pharmacist Pharmacist  10/14/19    Comment: phone number [phone]    Health Maintenance  Topic Date Due  . Hepatitis C Screening  Never done  . TETANUS/TDAP  04/18/2021 (Originally 06/23/1967)  . PNA vac Low Risk Adult (2 of 2 - PPSV23) 04/18/2021 (Originally 01/03/2018)  . COLONOSCOPY  07/17/2021  . MAMMOGRAM  01/24/2022  . INFLUENZA VACCINE  Completed  . DEXA SCAN  Completed  . COVID-19 Vaccine  Completed      Review of Systems Patient reports no vision/ hearing changes, adenopathy,fever, persistant/recurrent hoarseness , swallowing issues, palpitations, edema, persistant/recurrent cough, hemoptysis, dyspnea (rest/exertional/paroxysmal nocturnal), gastrointestinal bleeding (melena, rectal bleeding), abdominal pain, significant heartburn, bowel changes, GU symptoms (dysuria, hematuria, incontinence), Gyn symptoms (abnormal  bleeding, pain),  syncope, focal weakness, memory loss, numbness & tingling, skin/hair/nail changes, abnormal bruising or bleeding, anxiety, or depression.   + CP- described as a pressure on L side.  Pt reports this causes her to take a deep breath.  Is walking daily but pressure is worsening.  Has cardiology appt on Friday.  This visit occurred during the SARS-CoV-2 public health emergency.   Safety protocols were in place, including screening questions prior to the visit, additional usage of staff PPE, and extensive cleaning of exam room while observing appropriate contact time as indicated for disinfecting solutions.       Objective:   Physical Exam General Appearance:    Alert, cooperative, no distress, appears stated age  Head:    Normocephalic, without obvious abnormality, atraumatic  Eyes:    PERRL, conjunctiva/corneas clear, EOM's intact, fundi    benign, both eyes  Ears:    Normal TM's and external ear canals, both ears  Nose:   Deferred due to COVID  Throat:   Neck:   Supple, symmetrical, trachea midline, no adenopathy;    Thyroid: no enlargement/tenderness/nodules  Back:     Symmetric, no curvature, ROM normal, no CVA tenderness  Lungs:     Clear to auscultation bilaterally, respirations unlabored  Chest Wall:    No tenderness or deformity   Heart:    Regular rate and rhythm, S1 and S2 normal, I/VI SEM murmur, no rub or gallop  Breast Exam:    Deferred to GYN  Abdomen:     Soft, non-tender, bowel sounds active all four quadrants,    no masses, no organomegaly  Genitalia:    Deferred to GYN  Rectal:    Extremities:   Extremities normal, atraumatic, no cyanosis or edema  Pulses:   2+ and symmetric all extremities  Skin:   Skin color, texture, turgor normal, no rashes or lesions  Lymph nodes:   Cervical, supraclavicular, and axillary nodes normal  Neurologic:   CNII-XII intact, normal strength, sensation and reflexes    throughout  Assessment & Plan:

## 2020-06-13 NOTE — Assessment & Plan Note (Signed)
Chronic problem.  Not currently on any medication.  Has been statin intolerant in the past.  Check labs and adjust tx prn.

## 2020-06-13 NOTE — Patient Instructions (Signed)
Follow up in 6 months to recheck cholesterol We'll notify you of your lab results and make any changes if needed Keep up the good work on healthy diet and regular exercise- you're doing great! Call with any questions or concerns Stay Safe!  Stay Healthy! Happy Holidays!!!

## 2020-06-13 NOTE — Assessment & Plan Note (Signed)
Pt's PE WNL w/ exception of weight gain and known murmur.  UTD on colonoscopy, mammo, Prevnar, Flu, COVID.  Pneumovax today.  Check labs.  Anticipatory guidance provided.

## 2020-06-14 ENCOUNTER — Other Ambulatory Visit: Payer: Self-pay

## 2020-06-14 DIAGNOSIS — E785 Hyperlipidemia, unspecified: Secondary | ICD-10-CM

## 2020-06-14 MED ORDER — ROSUVASTATIN CALCIUM 10 MG PO TABS: 10.0000 mg | ORAL_TABLET | Freq: Every day | ORAL | 3 refills | Status: DC

## 2020-06-14 MED FILL — ROSUVASTATIN CALCIUM 10 MG: 10 | 30 days supply | Qty: 30 | Fill #0

## 2020-06-17 ENCOUNTER — Ambulatory Visit (HOSPITAL_COMMUNITY)
Admission: RE | Admit: 2020-06-17 | Discharge: 2020-06-17 | Disposition: A | Discharge status: Home / Self Care | Payer: PPO | Source: Ambulatory Visit | Attending: Internal Medicine | Admitting: Internal Medicine

## 2020-06-17 ENCOUNTER — Other Ambulatory Visit: Payer: Self-pay

## 2020-06-17 ENCOUNTER — Other Ambulatory Visit (HOSPITAL_COMMUNITY): Payer: Self-pay | Admitting: Internal Medicine

## 2020-06-17 ENCOUNTER — Encounter (HOSPITAL_COMMUNITY): Payer: Self-pay | Admitting: Internal Medicine

## 2020-06-17 ENCOUNTER — Encounter (HOSPITAL_COMMUNITY): Payer: Self-pay | Admitting: Cardiology

## 2020-06-17 VITALS — BP 118/82 | HR 79 | Wt 161.2 lb

## 2020-06-17 DIAGNOSIS — I080 Rheumatic disorders of both mitral and aortic valves: Secondary | ICD-10-CM | POA: Diagnosis not present

## 2020-06-17 DIAGNOSIS — Z8249 Family history of ischemic heart disease and other diseases of the circulatory system: Secondary | ICD-10-CM | POA: Insufficient documentation

## 2020-06-17 DIAGNOSIS — Z87891 Personal history of nicotine dependence: Secondary | ICD-10-CM | POA: Insufficient documentation

## 2020-06-17 DIAGNOSIS — Z923 Personal history of irradiation: Secondary | ICD-10-CM | POA: Diagnosis not present

## 2020-06-17 DIAGNOSIS — R002 Palpitations: Secondary | ICD-10-CM | POA: Insufficient documentation

## 2020-06-17 DIAGNOSIS — I350 Nonrheumatic aortic (valve) stenosis: Secondary | ICD-10-CM | POA: Diagnosis not present

## 2020-06-17 DIAGNOSIS — G4733 Obstructive sleep apnea (adult) (pediatric): Secondary | ICD-10-CM

## 2020-06-17 DIAGNOSIS — R911 Solitary pulmonary nodule: Secondary | ICD-10-CM | POA: Diagnosis not present

## 2020-06-17 DIAGNOSIS — Z79899 Other long term (current) drug therapy: Secondary | ICD-10-CM | POA: Diagnosis not present

## 2020-06-17 DIAGNOSIS — I493 Ventricular premature depolarization: Secondary | ICD-10-CM

## 2020-06-17 DIAGNOSIS — Z636 Dependent relative needing care at home: Secondary | ICD-10-CM | POA: Insufficient documentation

## 2020-06-17 DIAGNOSIS — Z853 Personal history of malignant neoplasm of breast: Secondary | ICD-10-CM | POA: Insufficient documentation

## 2020-06-17 DIAGNOSIS — I5022 Chronic systolic (congestive) heart failure: Secondary | ICD-10-CM

## 2020-06-17 DIAGNOSIS — I428 Other cardiomyopathies: Secondary | ICD-10-CM | POA: Insufficient documentation

## 2020-06-17 MED ORDER — MEXILETINE HCL 150 MG PO CAPS: 150.0000 mg | ORAL_CAPSULE | Freq: Two times a day (BID) | ORAL | 6 refills | Status: DC

## 2020-06-17 MED FILL — MEXILETINE HCL 150 MG CAPS: 150 | 30 days supply | Qty: 60 | Fill #0

## 2020-06-17 NOTE — Patient Instructions (Addendum)
START Mexiletine 150 mg, one tab twice a day  Your physician recommends that you schedule a follow-up appointment in: 4 months with Dr Haroldine Laws -please call our office in March to be added to the April schedule   If you have any questions or concerns before your next appointment please send [place] a message through [place] or call our office at [phone].    TO LEAVE A MESSAGE FOR THE NURSE SELECT OPTION 2, PLEASE LEAVE A MESSAGE INCLUDING: . YOUR NAME . DATE OF BIRTH . CALL BACK NUMBER . REASON FOR CALL**this is important as we prioritize the call backs  YOU WILL RECEIVE A CALL BACK THE SAME DAY AS LONG AS YOU CALL BEFORE 4:00 PM

## 2020-06-17 NOTE — Progress Notes (Signed)
Advanced Heart Failure Clinic Note   Primary Cardiologist: Dr. 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 She has been very active and continues to work as Immunologist at [employer].   Admitted 02/04/17-02/07/17 with new onset acute systolic CHF. EF 15-20%. Right and left heart cath done and showed Fick output/index 3.1/1.8. No CAD. She was started on losartan, Spiro and digoxin. Discharge weight was 140 pounds.   Holter monitor 8/18: Multiform PVCs, couplets, and triplets were noted with periods of bigeminy. PVC burden < 1%.No VT observed.  Echo 8/21: EF 50-55% mild to moderate AS mean 8mmHG  Echo 9/19: EF 45-50% moderate AS mean gradient 28mmHG  She returns today for HF follow up. Still struggling with PVCs associated with chest pressure. Says PVCs more frequent. Walking 3-5 miles without CP. No edema, orthopnea or PND. Not wearing CPAP.   Zio 11/21 1. Sinus rhythm - avg HR of 72 2. Isolated PACs were rare 3. Occasional PVCs  (1.3%, 17991) 4. No high-grade arrhythmias 5. Patient triggered events associated with isolated PVCs.    CPX 01/2018 Peak VO2: 21.5 (108% predicted peak VO2) VE/VCO2 slope: 48 OUES: 1.84 Peak RER: 0.93  Echo 05/09/17 EF ~30% RV ok. Personally reviewed  Echo 02/05/17 LVEF 15-20%, Grade 1 DD, Mild/Mod central MR, Mild LAE  R/LHC 02/06/17  There is severe left ventricular systolic dysfunction.  Dist Cx lesion, 20 %stenosed.  1st Mrg lesion, 60 %stenosed.  Dist RCA lesion, 20 %stenosed.  Mid LAD to Dist LAD lesion, 20 %stenosed.   Findings: Ao = 80/56 (68) LV = 86/14 RA = 2  RV = 22/2 PA = 24/11 (17) PCW = 6 Fick cardiac output/index = 3.1/1.8 PVR = 3.6 WU Ao sat = 94% PA sat = 58%,  58%  Cardiac MRI 06/2017 1.  Moderate LVE with diffuse hypokinesis EF 35% 2. Small area of apical hyperenhancement on delayed gadolinium images 3.  Mild MR 4.  Mild LAE 5. Thickened tri leaflet AV with restricted leaflet motion suggest echo for AS correlation 6.  Normal RV size and function 7.  Normal aortic root 3.0 cm  Review of systems complete and found to be negative unless listed in HPI.   Past Medical History:  Diagnosis Date  . Arthritis    ostearthritis -joints, greater left knee.  . Breast cancer, left breast ([place]) 2009   S/P surgery, radiation, chemotherapy.-no further oncology visits.  . CHF (congestive heart failure) ([place]) dx'd 02/05/2017  . Hyperlipidemia   . Neuromuscular disorder ([place])    neuropathy due to chemotherapy  . Personal history of chemotherapy 2009  . Personal history of radiation therapy 2009  . Seizures ([place])    "childbirth related"  . SVT (supraventricular tachycardia) ([facility])    x1 episode -3 yrs ago- no issues since.All heart testing negative. No further cardiac visits needed.   Current Outpatient Medications  Medication Sig Dispense Refill  . amiodarone (PACERONE) 100 MG tablet TAKE 1 TABLET BY MOUTH ONCE DAILY 90 tablet 3  . Biotin 5 MG CAPS Take by mouth daily.    . carvedilol (COREG) 3.125 MG tablet TAKE 1 TABLET (3.125 MG TOTAL) BY MOUTH 2 TIMES DAILY. 180 tablet 3  . Cholecalciferol (VITAMIN D-3) 1000 units CAPS Take 2 capsules by  mouth daily.     . ivabradine (CORLANOR) 5 MG TABS tablet Take 1 tablet (5 mg total) by mouth 2 (two) times daily with a meal. 180 tablet 3  . losartan (COZAAR) 25 MG tablet TAKE 1 TABLET BY MOUTH TWO TIMES DAILY 60 tablet 11  . Magnesium 250 MG TABS Take 1 tablet by mouth daily.     Marland Kitchen spironolactone (ALDACTONE) 25 MG tablet TAKE 1/2 TABLETS (12.5 MG TOTAL) BY MOUTH DAILY. 45 tablet 3   No current facility-administered medications for this encounter.   Allergies  Allergen Reactions  . Effexor [Venlafaxine]  Other (See Comments)    Caused depression, negative thoughts, and crying   . Hydrocodone-Acetaminophen Itching and Nausea And Vomiting    Patient can tolerate with Zofran    Social History   Socioeconomic History  . Marital status: Married    Spouse name: Not on file  . Number of children: 2  . Years of education: Not on file  . Highest education level: Not on file  Occupational History  . Occupation: anesthetist    Employer: [employer]: retired June 2019  Tobacco Use  . Smoking status: Former Smoker    Packs/day: 2.00    Years: 10.00    Pack years: 20.00    Types: Cigarettes    Quit date: 07/09/1989    Years since quitting: 30.9  . Smokeless tobacco: Never Used  Vaping Use  . Vaping Use: Never used  Substance and Sexual Activity  . Alcohol use: No  . Drug use: No  . Sexual activity: Yes  Other Topics Concern  . Not on file  Social History Narrative   Patient with caregiver stress due to spouses declining health   Some conflicts with children and addictions (coping well)    Social Determinants of Health   Financial Resource Strain: Not on file  Food Insecurity: No Food Insecurity  . Worried About Charity fundraiser in the Last Year: Never true  . Ran Out of Food in the Last Year: Never true  Transportation Needs: No Transportation Needs  . Lack of Transportation (Medical): No  . Lack of Transportation (Non-Medical): No  Physical Activity: Not on file  Stress: Not on file  Social Connections: Not on file  Intimate Partner Violence: Not on file   Family History  Problem Relation Age of Onset  . Heart disease Mother   . Hypertension Father   . Breast cancer Sister   . Lung cancer Sister   . Skin cancer Brother    Vitals:   06/17/20 0948  BP: 118/82  Pulse: 79  SpO2: 98%  Weight: 73.1 kg (161 lb 3.2 oz)   Wt Readings from Last 3 Encounters:  06/17/20 73.1 kg (161 lb 3.2 oz)  06/13/20 72.4 kg (159 lb 9.6 oz)  02/04/20 71.7 kg (158 lb)     PHYSICAL EXAM: General:  Well appearing. No resp difficulty HEENT: normal Neck: supple. no JVD. Carotids 2+ bilat; no bruits. No lymphadenopathy or thryomegaly appreciated. Cor: PMI nondisplaced. Regular rate & rhythm. 2/6 AS  Lungs: clear Abdomen: soft, nontender, nondistended. No hepatosplenomegaly. No bruits or masses. Good bowel sounds. Extremities: no cyanosis, clubbing, rash, edema Neuro: alert & orientedx3, cranial nerves grossly intact. moves all 4 extremities w/o difficulty. Affect pleasant  ECG: NSR 83 LVH LAFB Personally reviewed   ASSESSMENT & PLAN:  1. Chronic systolic CHF: NICM, EF 62-69%, felt to be related to doxorubicin therapy. But also with frequent  PVC's, holter monitor showed 1% PVC's but patient complained of very frequent PVC's.  - Echo 05/2017 ~30-35%. LV size - Echo 03/09/18 EF 45-50% - CPX 01/2018 Peak VO2: 21.5 (108% predicted peak VO2) VE/VCO2 slope: 48 Peak RER: 0.93 - Echo 10/20 EF 40-45% mild to moderate AS - Echo 8/21 EF 50-55% - Volume looks good. NYHA II. Main issue is symptomatic PVCs at this point - Continue lasix 20 mg as needed.  - Continue Spiro 12.5 mg daily. Did not tolerate higher dose. - Continue losartan 25 mg bid - Continue amiodarone 100 mg daily for now.  - Continue carvedilol 3.125 mg BID  2. Palpitations/Frequent PVCs - Holter monitor 03/2017 with PVC burden <1%. - Zio 9/21 PVCs 1.3% (monomorphic) - Continue amiodarone 100 mg daily. Recheck CMET and TFTs.  - We reviewed her monitor together and she is very symptomatic from PVCs. Will start mexilitene 150 bid and see how she responds. 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   3. Mitral Regurgitation - Mild on cMRI 07/07/2017 and echo  4. Pulmonary nodule - Stable on f/u CT 11/18. No change.   5. Aortic stenosis - moderate by echo in 9/19.  - echo 10/20 AoV mean 15 AVA 0.99 (mild to moderate AS) - echo 8/21 Mild to moderate aortic valve stenosis. Aortic valve area, by  VTI measures  1.48 cm. Aortic valve mean gradient measures 18 mmHg.   6. OSA - Encouraged her to wear CPAP   Glori Bickers, MD  10:04 AM

## 2020-06-17 NOTE — Progress Notes (Signed)
Subjective:   Patricia Collier is a 74 y.o. female who presents for Medicare Annual (Subsequent) preventive examination.  I connected with Ankita today by telephone and verified that I am speaking with the correct person using two identifiers. Location patient: home Location provider: work Persons participating in the virtual visit: patient, Marine scientist.    I discussed the limitations, risks, security and privacy concerns of performing an evaluation and management service by telephone and the availability of in person appointments. I also discussed with the patient that there may be a patient responsible charge related to this service. The patient expressed understanding and verbally consented to this telephonic visit.    Interactive audio and video telecommunications were attempted between this provider and patient, however failed, due to patient having technical difficulties OR patient did not have access to video capability.  We continued and completed visit with audio only.  Some vital signs may be absent or patient reported.   Time Spent with patient on telephone encounter: 20 minutes   Review of Systems     Cardiac Risk Factors include: advanced age (>21men, >12 women);dyslipidemia     Objective:    Today's Vitals   06/20/20 0946  Weight: 161 lb (73 kg)  Height: 5\' 3"  (1.6 m)   Body mass index is 28.52 kg/m.  Advanced Directives 06/20/2020 05/27/2019 05/06/2018 03/14/2018 02/05/2017 02/04/2017 09/21/2015  Does Patient Have a Medical Advance Directive? Yes Yes Yes Yes Yes Yes -  Type of Paramedic of [place];Living will Living will;Healthcare Power of [place];Living will [place];Living will [place];Living will [place];Living will -  Does patient want to make changes to medical advance directive? - No - Patient declined No - Patient declined No - Patient declined  No - Patient declined - -  Copy of [place] in Chart? No - copy requested No - copy requested No - copy requested No - copy requested No - copy requested - (No Data)    Current Medications (verified) Outpatient Encounter Medications as of 06/20/2020  Medication Sig  . amiodarone (PACERONE) 100 MG tablet TAKE 1 TABLET BY MOUTH ONCE DAILY  . Biotin 5 MG CAPS Take by mouth daily.  . carvedilol (COREG) 3.125 MG tablet TAKE 1 TABLET (3.125 MG TOTAL) BY MOUTH 2 TIMES DAILY.  Marland Kitchen Cholecalciferol (VITAMIN D-3) 1000 units CAPS Take 2 capsules by mouth daily.   . ivabradine (CORLANOR) 5 MG TABS tablet Take 1 tablet (5 mg total) by mouth 2 (two) times daily with a meal.  . losartan (COZAAR) 25 MG tablet TAKE 1 TABLET BY MOUTH TWO TIMES DAILY  . Magnesium 250 MG TABS Take 1 tablet by mouth daily.   Marland Kitchen spironolactone (ALDACTONE) 25 MG tablet TAKE 1/2 TABLETS (12.5 MG TOTAL) BY MOUTH DAILY.  Marland Kitchen mexiletine (MEXITIL) 150 MG capsule Take 1 capsule (150 mg total) by mouth 2 (two) times daily. (Patient not taking: Reported on 06/20/2020)  . [DISCONTINUED] furosemide (LASIX) 20 MG tablet Take 20 mg by mouth daily as needed. (Patient not taking: Reported on 06/13/2020)  . [DISCONTINUED] rosuvastatin (CRESTOR) 10 MG tablet Take 1 tablet (10 mg total) by mouth daily.   No facility-administered encounter medications on file as of 06/20/2020.    Allergies (verified) Effexor [venlafaxine] and Hydrocodone-acetaminophen   History: Past Medical History:  Diagnosis Date  . Arthritis    ostearthritis -joints, greater left knee.  . Breast cancer, left breast ([place])  2009   S/P surgery, radiation, chemotherapy.-no further oncology visits.  . CHF (congestive heart failure) ([place]) dx'd 02/05/2017  . Hyperlipidemia   . Neuromuscular disorder ([place])    neuropathy due to chemotherapy  . Personal history of chemotherapy 2009  . Personal history of radiation therapy 2009  . Seizures ([place])    "childbirth  related"  . SVT (supraventricular tachycardia) ([facility])    x1 episode -3 yrs ago- no issues since.All heart testing negative. No further cardiac visits needed.   Past Surgical History:  Procedure Laterality Date  . ABDOMINAL HYSTERECTOMY  1984  . BILATERAL SALPINGOOPHORECTOMY Bilateral 2000s  . BREAST BIOPSY Left 2009  . BREAST LUMPECTOMY Left 2009   sentinel node dissection  . BUNIONECTOMY Bilateral early 2000s  . CARDIAC CATHETERIZATION  2015  . DIAGNOSTIC LAPAROSCOPY  1990s-early 2000s   multiple for adhesions  . DILATION AND CURETTAGE OF UTERUS     w/laparoscopies  . HAMMER TOE SURGERY Right 09/2015; 06/22/2016; 12/28/2016   2nd digit  . KNEE ARTHROSCOPY Left 09/21/2015   Procedure: LEFT ARTHROSCOPY KNEE WITH LATERAL MENISCUS DEBRIDEMENT;  Surgeon: Gaynelle Arabian, MD;  Location: [facility];  Service: Orthopedics;  Laterality: Left;  . LAPAROSCOPIC CHOLECYSTECTOMY    . RHINOPLASTY  1979  . RIGHT/LEFT HEART CATH AND CORONARY ANGIOGRAPHY N/A 02/06/2017   Procedure: Right/Left Heart Cath and Coronary Angiography;  Surgeon: Jolaine Artist, MD;  Location: [facility];  Service: Cardiovascular;  Laterality: N/A;  . TONSILLECTOMY AND ADENOIDECTOMY  1955  . TRANSANAL RECTOPEXY  1990s   Family History  Problem Relation Age of Onset  . Heart disease Mother   . Hypertension Father   . Breast cancer Sister   . Lung cancer Sister   . Skin cancer Brother    Social History   Socioeconomic History  . Marital status: Married    Spouse name: Not on file  . Number of children: 2  . Years of education: Not on file  . Highest education level: Not on file  Occupational History  . 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 Smoking status: Former Smoker    Packs/day: 2.00    Years: 10.00    Pack years: 20.00    Types: Cigarettes    Quit date: 07/09/1989    Years since quitting: 30.9  . Smokeless tobacco: Never Used  Vaping Use  .  Vaping Use: Never used  Substance and Sexual Activity  . Alcohol use: No  . Drug use: No  . Sexual activity: Yes  Other Topics Concern  . Not on file  Social History Narrative   Patient with caregiver stress due to spouses declining health   Some conflicts with children and addictions (coping well)    Social Determinants of Health   Financial Resource Strain: Low Risk   . Difficulty of Paying Living Expenses: Not hard at all  Food Insecurity: No Food Insecurity  . Worried About Charity fundraiser in the Last Year: Never true  . Ran Out of Food in the Last Year: Never true  Transportation Needs: No Transportation Needs  . Lack of Transportation (Medical): No  . Lack of Transportation (Non-Medical): No  Physical Activity: Sufficiently Active  . Days of Exercise per Week: 7 days  . Minutes of Exercise per Session: 60 min  Stress: No Stress Concern Present  . Feeling of Stress : Only a little  Social Connections: Moderately Integrated  .  Frequency of Communication with Friends and Family: More than three times a week  . Frequency of Social Gatherings with Friends and Family: More than three times a week  . Attends Religious Services: Never  . Active Member of Clubs or Organizations: Yes  . Attends Archivist Meetings: More than 4 times per year  . Marital Status: Married    Tobacco Counseling Counseling given: Not Answered   Clinical Intake:  Pre-visit preparation completed: Yes  Pain : No/denies pain     Nutritional Status: BMI 25 -29 Overweight Nutritional Risks: None Diabetes: No  How often do you need to have someone help you when you read instructions, pamphlets, or other written materials from your doctor or pharmacy?: 1 - Never What is the last grade level you completed in school?: Mater's degree  Diabetic?No  Interpreter Needed?: No  Information entered by :: Caroleen Hamman LPN   Activities of Daily Living In your present state of health,  do you have any difficulty performing the following activities: 06/20/2020 06/13/2020  Hearing? N N  Vision? N N  Difficulty concentrating or making decisions? N N  Walking or climbing stairs? N N  Dressing or bathing? N N  Doing errands, shopping? N N  Preparing Food and eating ? N -  Using the Toilet? N -  In the past six months, have you accidently leaked urine? Y -  Comment occasionally -  Do you have problems with loss of bowel control? N -  Managing your Medications? N -  Managing your Finances? N -  Housekeeping or managing your Housekeeping? N -  Some recent data might be hidden    Patient Care Team: [name], MD as PCP - General (Family Medicine) Maisie Fus, MD as Consulting Physician (Obstetrics and Gynecology) Haroldine Laws, Shaune Pascal, MD as Consulting Physician (Cardiology) Ulla Gallo, MD as Consulting Physician (Dermatology) Madelin Rear, [facility] as Pharmacist (Pharmacist)  Indicate any recent Medical Services you may have received from other than [facility] providers in the past year (date may be approximate).     Assessment:   This is a routine wellness examination for [facility].  Hearing/Vision screen  Hearing Screening   125Hz  250Hz  500Hz  1000Hz  2000Hz  3000Hz  4000Hz  6000Hz  8000Hz   Right ear:           Left ear:           Comments: No issues  Vision Screening Comments: Last eye exam-06/2019-Dr. Venia Carbon  Dietary issues and exercise activities discussed: Current Exercise Habits: Home exercise routine, Type of exercise: walking, Time (Minutes): 60, Frequency (Times/Week): 7, Weekly Exercise (Minutes/Week): 420, Intensity: Mild  Goals    . Heart Failure: Minimize recurring symptoms.     CARE PLAN ENTRY  Current Barriers:  . Chronic Disease Management support, education, and care coordination needs related to  heart failure. o Currently taking amiodarone 100 mg once daily, carvedilol 3.125 mg twice daily, losartan 25 mg twice daily, spironolactone  12.5 mg once daily, magnesium 400 mg once daily   Pharmacist Clinical Goal(s):  Marland Kitchen Minimize recurring symptoms of heart failure.   Interventions: . Comprehensive medication review performed.  Patient Self Care Activities:  . Patient verbalizes understanding of plan to call with any questions over next three months.  Initial goal documentation.     . Hyperlipidemia: LDL <100     CARE PLAN ENTRY (see longitudinal plan of care for additional care plan information)  Current Barriers:  . Uncontrolled hyperlipidemia . Current antihyperlipidemic regimen: Diet  and Exervise . Previous antihyperlipidemic medications tried: n/a  . Most recent lipid panel:     Component Value Date/Time   CHOL 279 (H) 05/27/2019 1337   TRIG 248.0 (H) 05/27/2019 1337   HDL 61.60 05/27/2019 1337   CHOLHDL 5 05/27/2019 1337   VLDL 49.6 (H) 05/27/2019 1337   LDLCALC 162 (H) 07/24/2018 1100   LDLDIRECT 175.0 05/27/2019 1337   10-year ASCVD risk score: 13.6%  Pharmacist Clinical Goal(s):  Marland Kitchen Over the next 90 days, patient will work with PharmD and providers towards optimized antihyperlipidemic therapy  Interventions: . Comprehensive medication review performed; medication list updated in electronic medical record.  Bertram Savin care team collaboration (see longitudinal plan of care)  Patient Self Care Activities:  . Over next three months: focus on diet and exercise for cholesterol control. Consider alternative administration of rosuvastatin that we discussed, including rosuvastatin 10 mg 2-3x/week.   . Please call with any questions!  Initial goal documentation    . Patient Stated     Lose weight by eating healthier. Drink more water      Depression Screen PHQ 2/9 Scores 06/20/2020 06/13/2020 04/18/2020 05/27/2019 07/24/2018 01/03/2017 11/16/2013  PHQ - 2 Score 0 0 0 0 0 0 0  PHQ- 9 Score - 0 0 - 0 0 -    Fall Risk Fall Risk  06/20/2020 06/13/2020 04/18/2020 05/27/2019 07/24/2018  Falls in  the past year? 1 1 0 0 0  Number falls in past yr: 1 0 0 - -  Injury with Fall? 0 0 0 0 -  Risk for fall due to : History of fall(s) No Fall Risks - - -  Follow up Falls prevention discussed - Falls evaluation completed Falls evaluation completed;Education provided;Falls prevention discussed -    FALL RISK PREVENTION PERTAINING TO THE HOME:  Any stairs in or around the home? Yes  If so, are there any without handrails? No  Home free of loose throw rugs in walkways, pet beds, electrical cords, etc? Yes  Adequate lighting in your home to reduce risk of falls? Yes   ASSISTIVE DEVICES UTILIZED TO PREVENT FALLS:  Life alert? No  Use of a cane, walker or w/c? No  Grab bars in the bathroom? No  Shower chair or bench in shower? No  Elevated toilet seat or a handicapped toilet? No   TIMED UP AND GO:  Was the test performed? No . Phone visit   Cognitive Function:Normal cognitive status assessed by this Nurse Health Advisor. No abnormalities found.          Immunizations Immunization History  Administered Date(s) Administered  . Fluad Quad(high Dose 65+) 04/22/2019, 04/15/2020  . Influenza Inj Mdck Quad With Preservative 04/23/2018  . Influenza Split 05/09/2013  . Influenza-Unspecified 03/29/2017  . PFIZER SARS-COV-2 Vaccination 07/28/2019, 08/17/2019, 05/04/2020  . Pneumococcal Conjugate-13 01/03/2017  . Pneumococcal Polysaccharide-23 06/13/2020    TDAP status: Due, Education has been provided regarding the importance of this vaccine. Advised may receive this vaccine at local pharmacy or Health Dept. Aware to provide a copy of the vaccination record if obtained from local pharmacy or Health Dept. Verbalized acceptance and understanding.  Flu Vaccine status: Up to date  Pneumococcal vaccine status: Up to date  Covid-19 vaccine status: Completed vaccines  Qualifies for Shingles Vaccine? Yes   Zostavax completed No   Shingrix Completed?: No.    Education has been provided  regarding the importance of this vaccine. Patient has been advised to call insurance company to determine out  of pocket expense if they have not yet received this vaccine. Advised may also receive vaccine at local pharmacy or Health Dept. Verbalized acceptance and understanding.  Screening Tests Health Maintenance  Topic Date Due  . Hepatitis C Screening  Never done  . TETANUS/TDAP  04/18/2021 (Originally 06/23/1965)  . COLONOSCOPY  07/17/2021  . MAMMOGRAM  01/24/2022  . INFLUENZA VACCINE  Completed  . DEXA SCAN  Completed  . COVID-19 Vaccine  Completed  . PNA vac Low Risk Adult  Completed    Health Maintenance  Health Maintenance Due  Topic Date Due  . Hepatitis C Screening  Never done    Colorectal cancer screening: Type of screening: Colonoscopy. Completed 07/18/2011. Repeat every 10 years  Mammogram status: Completed Bilateral 01/25/2020. Repeat every year  Bone Density status: Patient states she had done at GYN office.Patient to have results sent to PCP.  Lung Cancer Screening: (Low Dose CT Chest recommended if Age 22-80 years, 30 pack-year currently smoking OR have quit w/in 15years.) does not qualify.    Additional Screening:  Hepatitis C Screening: does qualify; Discuss with PCP  Vision Screening: Recommended annual ophthalmology exams for early detection of glaucoma and other disorders of the eye. Is the patient up to date with their annual eye exam?  Yes  Who is the provider or what is the name of the office in which the patient attends annual eye exams? Dr. Venia Carbon   Dental Screening: Recommended annual dental exams for proper oral hygiene  Community Resource Referral / Chronic Care Management: CRR required this visit?  No   CCM required this visit?  No      Plan:     I have personally reviewed and noted the following in the patient's chart:   . Medical and social history . Use of alcohol, tobacco or illicit drugs  . Current medications and  supplements . Functional ability and status . Nutritional status . Physical activity . Advanced directives . List of other physicians . Hospitalizations, surgeries, and ER visits in previous 12 months . Vitals . Screenings to include cognitive, depression, and falls . Referrals and appointments  In addition, I have reviewed and discussed with patient certain preventive protocols, quality metrics, and best practice recommendations. A written personalized care plan for preventive services as well as general preventive health recommendations were provided to patient.   Due to this being a telephonic visit, the after visit summary with patients personalized plan was offered to patient via mail or my-chart. Patient would like to access on my-chart.   Marta Antu, LPN   [id number]  Nurse Health Advisor  Nurse Notes: None

## 2020-06-20 ENCOUNTER — Ambulatory Visit (INDEPENDENT_AMBULATORY_CARE_PROVIDER_SITE_OTHER): Payer: PPO

## 2020-06-20 VITALS — Ht 63.0 in | Wt 161.0 lb

## 2020-06-20 DIAGNOSIS — Z0000 Encounter for general adult medical examination without abnormal findings: Secondary | ICD-10-CM | POA: Diagnosis not present

## 2020-06-20 NOTE — Patient Instructions (Signed)
Patricia Collier , Thank you for taking time to complete your Medicare Wellness Visit. I appreciate your ongoing commitment to your health goals. Please review the following plan we discussed and let me know if I can assist you in the future.   Screening recommendations/referrals: Colonoscopy: Completed 07/18/2011-Due 07/17/2021 Mammogram: Completed 01/25/2020- Due 01/24/2021 Bone Density: Per our conversation , last completed at GYN office. Please have results sent to Dr. Birdie Riddle. Recommended yearly ophthalmology/optometry visit for glaucoma screening and checkup Recommended yearly dental visit for hygiene and checkup  Vaccinations: Influenza vaccine: Up to date Pneumococcal vaccine: Completed vaccines Tdap vaccine: Discuss with pharmacy Shingles vaccine: Discuss with pharmacy  Covid-19:Completed vaccines  Advanced directives: Please bring a copy for your chart.  Conditions/risks identified: See problem list  Next appointment: Follow up in one year for your annual wellness visit   Preventive Care 65 Years and Older, Female Preventive care refers to lifestyle choices and visits with your health care provider that can promote health and wellness. What does preventive care include?  A yearly physical exam. This is also called an annual well check.  Dental exams once or twice a year.  Routine eye exams. Ask your health care provider how often you should have your eyes checked.  Personal lifestyle choices, including:  Daily care of your teeth and gums.  Regular physical activity.  Eating a healthy diet.  Avoiding tobacco and drug use.  Limiting alcohol use.  Practicing safe sex.  Taking low-dose aspirin every day.  Taking vitamin and mineral supplements as recommended by your health care provider. What happens during an annual well check? The services and screenings done by your health care provider during your annual well check will depend on your age, overall health, lifestyle  risk factors, and family history of disease. Counseling  Your health care provider may ask you questions about your:  Alcohol use.  Tobacco use.  Drug use.  Emotional well-being.  Home and relationship well-being.  Sexual activity.  Eating habits.  History of falls.  Memory and ability to understand (cognition).  Work and work Statistician.  Reproductive health. Screening  You may have the following tests or measurements:  Height, weight, and BMI.  Blood pressure.  Lipid and cholesterol levels. These may be checked every 5 years, or more frequently if you are over 69 years old.  Skin check.  Lung cancer screening. You may have this screening every year starting at age 72 if you have a 30-pack-year history of smoking and currently smoke or have quit within the past 15 years.  Fecal occult blood test (FOBT) of the stool. You may have this test every year starting at age 72.  Flexible sigmoidoscopy or colonoscopy. You may have a sigmoidoscopy every 5 years or a colonoscopy every 10 years starting at age 72.  Hepatitis C blood test.  Hepatitis B blood test.  Sexually transmitted disease (STD) testing.  Diabetes screening. This is done by checking your blood sugar (glucose) after you have not eaten for a while (fasting). You may have this done every 1-3 years.  Bone density scan. This is done to screen for osteoporosis. You may have this done starting at age 72.  Mammogram. This may be done every 1-2 years. Talk to your health care provider about how often you should have regular mammograms. Talk with your health care provider about your test results, treatment options, and if necessary, the need for more tests. Vaccines  Your health care provider may recommend certain vaccines, such  as:  Influenza vaccine. This is recommended every year.  Tetanus, diphtheria, and acellular pertussis (Tdap, Td) vaccine. You may need a Td booster every 10 years.  Zoster vaccine.  You may need this after age 56.  Pneumococcal 13-valent conjugate (PCV13) vaccine. One dose is recommended after age 72.  Pneumococcal polysaccharide (PPSV23) vaccine. One dose is recommended after age 63. Talk to your health care provider about which screenings and vaccines you need and how often you need them. This information is not intended to replace advice given to you by your health care provider. Make sure you discuss any questions you have with your health care provider. Document Released: 07/22/2015 Document Revised: 03/14/2016 Document Reviewed: 04/26/2015 Elsevier Interactive Patient Education  2017 [place] Prevention in the Home Falls can cause injuries. They can happen to people of all ages. There are many things you can do to make your home safe and to help prevent falls. What can I do on the outside of my home?  Regularly fix the edges of walkways and driveways and fix any cracks.  Remove anything that might make you trip as you walk through a door, such as a raised step or threshold.  Trim any bushes or trees on the path to your home.  Use bright outdoor lighting.  Clear any walking paths of anything that might make someone trip, such as rocks or tools.  Regularly check to see if handrails are loose or broken. Make sure that both sides of any steps have handrails.  Any raised decks and porches should have guardrails on the edges.  Have any leaves, snow, or ice cleared regularly.  Use sand or salt on walking paths during winter.  Clean up any spills in your garage right away. This includes oil or grease spills. What can I do in the bathroom?  Use night lights.  Install grab bars by the toilet and in the tub and shower. Do not use towel bars as grab bars.  Use non-skid mats or decals in the tub or shower.  If you need to sit down in the shower, use a plastic, non-slip stool.  Keep the floor dry. Clean up any water that spills on the floor as soon  as it happens.  Remove soap buildup in the tub or shower regularly.  Attach bath mats securely with double-sided non-slip rug tape.  Do not have throw rugs and other things on the floor that can make you trip. What can I do in the bedroom?  Use night lights.  Make sure that you have a light by your bed that is easy to reach.  Do not use any sheets or blankets that are too big for your bed. They should not hang down onto the floor.  Have a firm chair that has side arms. You can use this for support while you get dressed.  Do not have throw rugs and other things on the floor that can make you trip. What can I do in the kitchen?  Clean up any spills right away.  Avoid walking on wet floors.  Keep items that you use a lot in easy-to-reach places.  If you need to reach something above you, use a strong step stool that has a grab bar.  Keep electrical cords out of the way.  Do not use floor polish or wax that makes floors slippery. If you must use wax, use non-skid floor wax.  Do not have throw rugs and other things on the  floor that can make you trip. What can I do with my stairs?  Do not leave any items on the stairs.  Make sure that there are handrails on both sides of the stairs and use them. Fix handrails that are broken or loose. Make sure that handrails are as long as the stairways.  Check any carpeting to make sure that it is firmly attached to the stairs. Fix any carpet that is loose or worn.  Avoid having throw rugs at the top or bottom of the stairs. If you do have throw rugs, attach them to the floor with carpet tape.  Make sure that you have a light switch at the top of the stairs and the bottom of the stairs. If you do not have them, ask someone to add them for you. What else can I do to help prevent falls?  Wear shoes that:  Do not have high heels.  Have rubber bottoms.  Are comfortable and fit you well.  Are closed at the toe. Do not wear sandals.  If  you use a stepladder:  Make sure that it is fully opened. Do not climb a closed stepladder.  Make sure that both sides of the stepladder are locked into place.  Ask someone to hold it for you, if possible.  Clearly mark and make sure that you can see:  Any grab bars or handrails.  First and last steps.  Where the edge of each step is.  Use tools that help you move around (mobility aids) if they are needed. These include:  Canes.  Walkers.  Scooters.  Crutches.  Turn on the lights when you go into a dark area. Replace any light bulbs as soon as they burn out.  Set up your furniture so you have a clear path. Avoid moving your furniture around.  If any of your floors are uneven, fix them.  If there are any pets around you, be aware of where they are.  Review your medicines with your doctor. Some medicines can make you feel dizzy. This can increase your chance of falling. Ask your doctor what other things that you can do to help prevent falls. This information is not intended to replace advice given to you by your health care provider. Make sure you discuss any questions you have with your health care provider. Document Released: 04/21/2009 Document Revised: 12/01/2015 Document Reviewed: 07/30/2014 Elsevier Interactive Patient Education  2017 [employer].

## 2020-06-24 ENCOUNTER — Ambulatory Visit: Payer: PPO

## 2020-06-24 NOTE — Progress Notes (Unsigned)
   Chronic Care Management Pharmacy  Name: Patricia Collier  MRN: [id number] DOB: [birth year]  Chief Complaint/ HPI Patricia Collier,  75 y.o. , female presents for their *** CCM visit with the clinical pharmacist ***.   PCP : Patricia Minium, MD.   Their chronic conditions include: CHF, hyperlipidemia, breast cancer.  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 Says PVCs more frequent. Walking 3-5 miles without CP. No edema, orthopnea or PND. Not wearing CPAP.  11/18 (PCP): hyperlipidemia, TG and LDL elevated; instructed to f/u 08/2019. 04/18/2020 (PCP): urine frequency, cephalexin 500 mg twice daily.  Outpatient Encounter Medications as of 06/24/2020  Medication Sig  . amiodarone (PACERONE) 100 MG tablet TAKE 1 TABLET BY MOUTH ONCE DAILY  . Biotin 5 MG CAPS Take by mouth daily.  . carvedilol (COREG) 3.125 MG tablet TAKE 1 TABLET (3.125 MG TOTAL) BY MOUTH 2 TIMES DAILY.  Marland Kitchen Cholecalciferol (VITAMIN D-3) 1000 units CAPS Take 2 capsules by mouth daily.   . ivabradine (CORLANOR) 5 MG TABS tablet Take 1 tablet (5 mg total) by mouth 2 (two) times daily with a meal.  . losartan (COZAAR) 25 MG tablet TAKE 1 TABLET BY MOUTH TWO TIMES DAILY  . Magnesium 250 MG TABS Take 1 tablet by mouth daily.   Marland Kitchen mexiletine (MEXITIL) 150 MG capsule Take 1 capsule (150 mg total) by mouth 2 (two) times daily. (Patient not taking: Reported on 06/20/2020)  . spironolactone (ALDACTONE) 25 MG tablet TAKE 1/2 TABLETS (12.5 MG TOTAL) BY MOUTH DAILY.   No facility-administered encounter medications on file as of 06/24/2020.   Current Diagnosis/Assessment: Goals Addressed   None    Hyperlipidemia   LDL goal < ***  Patient has tried these meds in past: atorvastatin 20 mg.  Previously discussed side effects of statins at length, concerned memory changes, had recommended pt consider rosuvastatin.  Not at goal at time of rosuvastatin initiation:   . Rosuvastatin 5 mg once daily (06/24/2020)  Plan  ***  Congestive Heart Failure   .l . Pulse Readings from Last 3 Encounters:  06/17/20 79  06/13/20 64  02/04/20 90   BP Readings from Last 3 Encounters:  06/17/20 118/82  06/13/20 112/70  02/04/20 108/68   - Echo 10/20 EF 40-45% mild to moderate AS - Echo 8/21 EF 50-55%  Previously reported BP readings have been normal at home 120s/80s, sometimes less. At last ov reported sob with exercise or walking up the stairs.   Amiodarone 100 mg once daily  Carvedilol 3.125 mg twice daily  Losartan 25 mg twice daily  Spironolactone 12.5 mg once daily  Ivabradine 5 mg twice daily with meal (02/2020)  Mexiletine 150 mg twice daily  Furosemide 20 mg as needed  Plan  ***  Vaccines   Immunization History  Administered Date(s) Administered  . Fluad Quad(high Dose 65+) 04/22/2019, 04/15/2020  . Influenza Inj Mdck Quad With Preservative 04/23/2018  . Influenza Split 05/09/2013  . Influenza-Unspecified 03/29/2017  . PFIZER SARS-COV-2 Vaccination 07/28/2019, 08/17/2019, 05/04/2020  . Pneumococcal Conjugate-13 01/03/2017  . Pneumococcal Polysaccharide-23 06/13/2020   May still need tdap***  Plan ***  Medication Management   Receives prescription medications from:  [place], [place] [address] [place] [address] Phone: [phone] Fax: [phone]  ***  Plan  ***.  Follow up: 5 month phone visit.  Madelin Rear, Pharm.D., BCGP Clinical Pharmacist [facility] [phone]

## 2020-06-24 NOTE — Chronic Care Management (AMB) (Signed)
.   Chronic Care Management   Outreach Note   Name: Patricia Collier MRN: [id number] DOB: 02/27/1946  Referred by: [name], MD Reason for referral: Telephone Appointment with [facility] Pharmacist, Madelin Rear.   Telephone appointment with clinical pharmacist today (06/24/2020) at [place] with pt, current on vacation, RS next month.  Madelin Rear, Pharm.D., BCGP Clinical Pharmacist [facility] [phone]

## 2020-06-30 ENCOUNTER — Telehealth: Payer: PPO

## 2020-07-11 DIAGNOSIS — H353131 Nonexudative age-related macular degeneration, bilateral, early dry stage: Secondary | ICD-10-CM | POA: Diagnosis not present

## 2020-07-14 MED FILL — ROSUVASTATIN CALCIUM 10 MG: 10 | 30 days supply | Qty: 30 | Fill #0

## 2020-07-14 MED FILL — MEXILETINE HCL 150 MG CAPS: 150 | 30 days supply | Qty: 60 | Fill #1

## 2020-07-14 MED FILL — LOSARTAN POTASSIUM 25 MG TA: 25 | 30 days supply | Qty: 60 | Fill #4

## 2020-07-14 MED FILL — AMIODARONE HCL 100 MG TABS: 100 | 90 days supply | Qty: 90 | Fill #3

## 2020-07-15 ENCOUNTER — Telehealth: Payer: PPO

## 2020-07-29 ENCOUNTER — Ambulatory Visit: Payer: PPO

## 2020-07-29 NOTE — Progress Notes (Signed)
Chronic Care Management Pharmacy  Name: Patricia Collier  MRN: [id number] DOB: October 25, 1945  Chief Complaint/ HPI Patricia Collier,  75 y.o. , female presents for their follow-up CCM visit with the clinical pharmacist via telephone due to COVID-19 pandemic.   PCP : [name], MD.   Their chronic conditions include: CHF, hyperlipidemia, breast cancer.  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 Says PVCs more frequent. Walking 3-5 miles without CP. No edema, orthopnea or PND. Not wearing CPAP. We reviewed her monitor together and she is very symptomatic from PVCs. Will start mexilitene 150 bid and see how she responds. 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  06/13/2020 (PCP): hyperlipidemia, We will start Crestor 10mg  nightly (#30, 3 refills) and please add daily OTC CoQ10 supplement to help minimize any potential side effects. F/u 6 month recheck on cholesterol. 04/18/2020 (PCP): urine frequency, cephalexin 500 mg twice daily.  Outpatient Encounter Medications as of 07/29/2020  Medication Sig  . amiodarone (PACERONE) 100 MG tablet TAKE 1 TABLET BY MOUTH ONCE DAILY  . Biotin 5 MG CAPS Take by mouth daily.  . carvedilol (COREG) 3.125 MG tablet TAKE 1 TABLET (3.125 MG TOTAL) BY MOUTH 2 TIMES DAILY.  Marland Kitchen Cholecalciferol (VITAMIN D-3) 1000 units CAPS Take 2 capsules by mouth daily.   . ivabradine (CORLANOR) 5 MG TABS tablet Take 1 tablet (5 mg total) by mouth 2 (two) times daily with a meal.  . losartan (COZAAR) 25 MG tablet TAKE 1 TABLET BY MOUTH TWO TIMES DAILY  . Magnesium 250 MG TABS Take 1 tablet by mouth daily.   Marland Kitchen mexiletine (MEXITIL) 150 MG capsule Take 1 capsule (150 mg total) by mouth 2 (two) times daily. (Patient not taking: Reported on 06/20/2020)  . rosuvastatin (CRESTOR) 10 MG tablet Take 10 mg by mouth daily.  Marland Kitchen spironolactone (ALDACTONE) 25 MG tablet TAKE 1/2 TABLETS (12.5 MG TOTAL) BY MOUTH DAILY.    No facility-administered encounter medications on file as of 07/29/2020.   Current Diagnosis/Assessment: Goals Addressed            This Visit's Progress   . COMPLETED: Heart Failure: Minimize recurring symptoms.   On track    CARE PLAN ENTRY  Current Barriers:  . Chronic Disease Management support, education, and care coordination needs related to  heart failure. o Currently taking amiodarone 100 mg once daily, carvedilol 3.125 mg twice daily, losartan 25 mg twice daily, spironolactone 12.5 mg once daily, magnesium 400 mg once daily   Pharmacist Clinical Goal(s):  Marland Kitchen Minimize recurring symptoms of heart failure.   Interventions: . Comprehensive medication review performed.  Patient Self Care Activities:  . Patient verbalizes understanding of plan to call with any questions over next three months.  Initial goal documentation.     . Hyperlipidemia: LDL <70   On track    [place] (see longitudinal plan of care for additional care plan information)  Current Barriers:  . Uncontrolled hyperlipidemia . Current antihyperlipidemic regimen: Diet and Exervise . Previous antihyperlipidemic medications tried: n/a  . Most recent lipid panel:     Component Value Date/Time   CHOL 275 (H) 06/13/2020 1036   TRIG 166.0 (H) 06/13/2020 1036   HDL 54.10 06/13/2020 1036   CHOLHDL 5 06/13/2020 1036   VLDL 33.2 06/13/2020 1036   LDLCALC 188 (H) 06/13/2020 1036   LDLDIRECT 175.0 05/27/2019 1337   10-year ASCVD risk score: 13.6%  Pharmacist Clinical Goal(s):  .  Over the next 90 days, patient will work with PharmD and providers towards optimized antihyperlipidemic therapy  Interventions: . Comprehensive medication review performed; medication list updated in electronic medical record.  Bertram Savin care team collaboration (see longitudinal plan of care)  Patient Self Care Activities:  . Over next three months: focus on diet and exercise for cholesterol control.   . Continue rosuvastatin 10 mg daily. . Please call with any questions! Initial goal documentation      Hyperlipidemia   LDL goal < 70  Lipid Panel     Component Value Date/Time   CHOL 275 (H) 06/13/2020 1036   TRIG 166.0 (H) 06/13/2020 1036   HDL 54.10 06/13/2020 1036   CHOLHDL 5 06/13/2020 1036   VLDL 33.2 06/13/2020 1036   LDLCALC 188 (H) 06/13/2020 1036   LDLDIRECT 175.0 05/27/2019 1337   Patient has tried these meds in past: atorvastatin 20 mg.  Previously concerned memory changes related to statins.   Previous not at goal prior to statin initiation. Denies any side effects with rosuvastatin following initiation 06/2020. Current regimen: . Rosuvastatin 10 mg once daily (06/24/2020)  Reviewed cholesterol goals, medication side effects - no problems noted, will need to schedule 6 month f/u with PCP.  Plan  Continue current management Pt to schedule f/u visit with PCP  Congestive Heart Failure/PVCs   Pulse Readings from Last 3 Encounters:  06/17/20 79  06/13/20 64  02/04/20 90   BP Readings from Last 3 Encounters:  06/17/20 118/82  06/13/20 112/70  02/04/20 108/68   BMP Latest Ref Rng & Units 06/13/2020 02/04/2020 05/27/2019  Glucose 70 - 99 mg/dL 101(H) 131(H) 85  BUN 6 - 23 mg/dL 15 23 19   Creatinine 0.40 - 1.20 mg/dL 0.74 0.78 0.77  Sodium 135 - 145 mEq/L 141 137 136  Potassium 3.5 - 5.1 mEq/L 4.0 3.9 4.6  Chloride 96 - 112 mEq/L 106 107 102  CO2 19 - 32 mEq/L 27 21(L) 26  Calcium 8.4 - 10.5 mg/dL 9.2 9.4 9.4   Home readings 120s/80s, sometimes less. No formal diet/exercise plans.  Denies recent dizziness or other side effects.  Amiodarone 100 mg once daily  Carvedilol 3.125 mg twice daily  Losartan 25 mg twice daily  Spironolactone 12.5 mg once daily  Ivabradine 5 mg twice daily with meal (02/2020)  Mexiletine 150 mg twice daily  Reviewed home monitoring of BP/side effects - no problems noted.  Plan  Continue current  management  Medication Management   Receives prescription medications from:  [place], [place] - [address] [address] Phone: [phone] Fax: [phone]  Denies any issues with current medication management.   Plan  Continue current management  Follow up:  CPA: 3 month BP call, ensure patient has scheduled f/u visit with PCP to check on cholesterol (needs to schedule 6 months from 06/2020).  RPH: 8 month f/u.  Future Appointments  Date Time Provider [place]  03/31/2021  1:30 PM [facility] PHARMACIST [facility]   Madelin Rear, Pharm.D., BCGP Clinical Pharmacist [facility] [phone]

## 2020-08-01 NOTE — Patient Instructions (Signed)
Ms. Mannina,  Thank you for taking the time to review your medications with me today.  I have included our care plan/goals in the following pages. Please review and call me at [phone] with any questions!  Thanks! Ellin Mayhew, Pharm.D., BCGP Clinical Pharmacist [facility] [phone]  Goals Addressed            This Visit's Progress   . Heart Failure: Minimize recurring symptoms.   On track    CARE PLAN ENTRY  Current Barriers:  . Chronic Disease Management support, education, and care coordination needs related to  heart failure. o Currently taking amiodarone 100 mg once daily, carvedilol 3.125 mg twice daily, losartan 25 mg twice daily, spironolactone 12.5 mg once daily, magnesium 400 mg once daily   Pharmacist Clinical Goal(s):  Marland Kitchen Minimize recurring symptoms of heart failure.   Interventions: . Comprehensive medication review performed.  Patient Self Care Activities:  . Patient verbalizes understanding of plan to call with any questions over next three months.  Initial goal documentation.     . Hyperlipidemia: LDL <70   On track    [place] (see longitudinal plan of care for additional care plan information)  Current Barriers:  . Uncontrolled hyperlipidemia . Current antihyperlipidemic regimen: Diet and Exervise . Previous antihyperlipidemic medications tried: n/a  . Most recent lipid panel:     Component Value Date/Time   CHOL 275 (H) 06/13/2020 1036   TRIG 166.0 (H) 06/13/2020 1036   HDL 54.10 06/13/2020 1036   CHOLHDL 5 06/13/2020 1036   VLDL 33.2 06/13/2020 1036   LDLCALC 188 (H) 06/13/2020 1036   LDLDIRECT 175.0 05/27/2019 1337   10-year ASCVD risk score: 13.6%  Pharmacist Clinical Goal(s):  Marland Kitchen Over the next 90 days, patient will work with PharmD and providers towards optimized antihyperlipidemic therapy  Interventions: . Comprehensive medication review performed; medication list updated in electronic medical  record.  Bertram Savin care team collaboration (see longitudinal plan of care)  Patient Self Care Activities:  . Over next three months: focus on diet and exercise for cholesterol control.  . Continue rosuvastatin 5 mg daily. . Please call with any questions! Initial goal documentation      The patient verbalized understanding of instructions provided today and agreed to receive a MyChart copy of patient instruction and/or educational materials. Telephone follow up appointment with pharmacy team member scheduled for: See next appointment with "Care Management Staff" under "What's Next" below.

## 2020-08-11 MED FILL — CARVEDILOL 3.125 MG TABLET: 3.125 | 90 days supply | Qty: 180 | Fill #3

## 2020-08-11 MED FILL — MEXILETINE HCL 150 MG CAPS: 150 | 30 days supply | Qty: 60 | Fill #2

## 2020-08-11 MED FILL — ROSUVASTATIN CALCIUM 10 MG: 10 | 30 days supply | Qty: 30 | Fill #1

## 2020-08-11 MED FILL — LOSARTAN POTASSIUM 25 MG TA: 25 | 30 days supply | Qty: 60 | Fill #5

## 2020-08-31 MED FILL — SPIRONOLACTONE 25 MG TABS: 25 | 90 days supply | Qty: 45 | Fill #2

## 2020-09-01 MED FILL — CORLANOR 5 MG TABLET: 5 | 90 days supply | Qty: 180 | Fill #2

## 2020-09-14 MED FILL — LOSARTAN POTASSIUM 25 MG TA: 25 | 30 days supply | Qty: 60 | Fill #6

## 2020-09-14 MED FILL — ROSUVASTATIN CALCIUM 10 MG: 10 | 30 days supply | Qty: 30 | Fill #2

## 2020-09-14 MED FILL — MEXILETINE HCL 150 MG CAPS: 150 | 30 days supply | Qty: 60 | Fill #3

## 2020-10-09 ENCOUNTER — Other Ambulatory Visit (HOSPITAL_COMMUNITY): Payer: Self-pay

## 2020-10-11 ENCOUNTER — Other Ambulatory Visit (HOSPITAL_COMMUNITY): Payer: Self-pay

## 2020-10-11 ENCOUNTER — Other Ambulatory Visit (HOSPITAL_COMMUNITY): Payer: Self-pay | Admitting: Internal Medicine

## 2020-10-11 MED FILL — Mexiletine HCl Cap 150 MG: ORAL | 30 days supply | Qty: 60 | Fill #0 | Status: AC

## 2020-10-11 MED FILL — Losartan Potassium Tab 25 MG: ORAL | 30 days supply | Qty: 60 | Fill #0 | Status: AC

## 2020-10-12 ENCOUNTER — Other Ambulatory Visit (HOSPITAL_COMMUNITY): Payer: Self-pay

## 2020-10-12 MED ORDER — AMIODARONE HCL 100 MG PO TABS
ORAL_TABLET | Freq: Every day | ORAL | 3 refills | Status: DC
Start: 2020-10-12 — End: 2020-10-12
  Filled 2020-10-12: qty 90, fill #0

## 2020-10-12 MED ORDER — AMIODARONE HCL 200 MG PO TABS
100.0000 mg | ORAL_TABLET | Freq: Every day | ORAL | 3 refills | Status: DC
  Filled 2020-10-12: qty 45, 90d supply, fill #0

## 2020-10-13 ENCOUNTER — Other Ambulatory Visit (HOSPITAL_COMMUNITY): Payer: Self-pay

## 2020-10-27 ENCOUNTER — Telehealth: Payer: Self-pay

## 2020-10-27 NOTE — Chronic Care Management (AMB) (Signed)
Chronic Care Management Pharmacy Assistant   Name: Patricia Collier  MRN: [id number] DOB: 04-08-1946   Reason for Encounter: Hypertension Disease State Call   Recent office visits:  06/13/20- Annye Asa, MD- follow up 6 months for chl recheck, cephalexin 500 mg bid x 5 days 04/18/20 - Annye Asa, MD ( Video Visit)- urine frequency   Recent consult visits:  06/17/20- Glori Bickers , MD ( Cardiology)- started mexiletine 150 mg bid, follow up 4 months  Hospital visits:  None in previous 6 months  Medications: Outpatient Encounter Medications as of 10/27/2020  Medication Sig  . amiodarone (PACERONE) 200 MG tablet Take 1/2 tablet (100 mg total) by mouth daily.  . Biotin 5 MG CAPS Take by mouth daily.  . carvedilol (COREG) 3.125 MG tablet TAKE 1 TABLET BY MOUTH 2 TIMES DAILY.  . cephALEXin (KEFLEX) 500 MG capsule TAKE 1 CAPSULE BY MOUTH TWICE DAILY FOR 10 DAYS  . Cholecalciferol (VITAMIN D-3) 1000 units CAPS Take 2 capsules by mouth daily.   . ivabradine (CORLANOR) 5 MG TABS tablet TAKE 1 TABLET BY MOUTH 2 TIMES DAILY WITH A MEAL.  Marland Kitchen losartan (COZAAR) 25 MG tablet TAKE 1 TABLET BY MOUTH TWO TIMES DAILY  . Magnesium 250 MG TABS Take 1 tablet by mouth daily.   Marland Kitchen mexiletine (MEXITIL) 150 MG capsule TAKE 1 CAPSULE BY MOUTH 2 TIMES DAILY. (Patient not taking: Reported on 06/20/2020)  . rosuvastatin (CRESTOR) 10 MG tablet Take 10 mg by mouth daily.  Marland Kitchen spironolactone (ALDACTONE) 25 MG tablet TAKE 1/2 TABLET BY MOUTH DAILY   No facility-administered encounter medications on file as of 10/27/2020.   Reviewed chart prior to disease state call. Spoke with patient regarding BP  Recent Office Vitals: BP Readings from Last 3 Encounters:  06/17/20 118/82  06/13/20 112/70  02/04/20 108/68   Pulse Readings from Last 3 Encounters:  06/17/20 79  06/13/20 64  02/04/20 90    Wt Readings from Last 3 Encounters:  06/20/20 161 lb (73 kg)  06/17/20 161 lb 3.2 oz (73.1 kg)   06/13/20 159 lb 9.6 oz (72.4 kg)     Kidney Function Lab Results  Component Value Date/Time   CREATININE 0.74 06/13/2020 10:36 AM   CREATININE 0.78 02/04/2020 03:28 PM   CREATININE 0.8 11/11/2012 09:20 AM   GFR 81.08 06/13/2020 10:36 AM   GFRNONAA >60 02/04/2020 03:28 PM   GFRAA >60 02/04/2020 03:28 PM    BMP Latest Ref Rng & Units 06/13/2020 02/04/2020 05/27/2019  Glucose 70 - 99 mg/dL 101(H) 131(H) 85  BUN 6 - 23 mg/dL 15 23 19   Creatinine 0.40 - 1.20 mg/dL 0.74 0.78 0.77  Sodium 135 - 145 mEq/L 141 137 136  Potassium 3.5 - 5.1 mEq/L 4.0 3.9 4.6  Chloride 96 - 112 mEq/L 106 107 102  CO2 19 - 32 mEq/L 27 21(L) 26  Calcium 8.4 - 10.5 mg/dL 9.2 9.4 9.4    Current antihypertensive regimen:  carvedilol (COREG) 3.125 MG- take one tab twice daily losartan (COZAAR) 25 MG - take one tab twice daily  How often are you checking your Blood Pressure?  Patient stated she checks her blood pressure regularly even with the absence of htn.  Current home BP readings: 110/70, 106/64  What recent interventions/DTPs have been made by any provider to improve Blood Pressure control since last CPP Visit:  No recent interventions   Any recent hospitalizations or ED visits since last visit with CPP? No  What diet changes  have been made to improve Blood Pressure Control?  Patient did not offer any changes in diet.   What exercise is being done to improve your Blood Pressure Control?  Patient gets a regular amount of physical activity. She was playing golf during our conversation.   Patient stated that her health is in good condition. She stated that she has a follow up appointment with her cardiologist next month and if her chl levels are not checked she will reschedule her  f/u appointment with PCP for lab work.   Adherence Review: Is the patient currently on ACE/ARB medication? Yes Does the patient have >5 day gap between last estimated fill dates? No  carvedilol (COREG) 3.125 MG- 90 DS  last filled 08/11/20  Star Rating Drugs: Losartan 25 mg - 30 DS last filled 10/13/20 Rosuvastatin 10 mg- 30 DS last filled 08/11/20  Wilford Sports CPA, CMA

## 2020-11-16 ENCOUNTER — Other Ambulatory Visit (HOSPITAL_COMMUNITY): Payer: Self-pay

## 2020-11-16 ENCOUNTER — Other Ambulatory Visit: Payer: Self-pay | Admitting: Family Medicine

## 2020-11-16 ENCOUNTER — Other Ambulatory Visit (HOSPITAL_COMMUNITY): Payer: Self-pay | Admitting: Internal Medicine

## 2020-11-16 DIAGNOSIS — E785 Hyperlipidemia, unspecified: Secondary | ICD-10-CM

## 2020-11-16 MED ORDER — CARVEDILOL 3.125 MG PO TABS
ORAL_TABLET | Freq: Two times a day (BID) | ORAL | 3 refills | Status: DC
  Filled 2020-11-16: qty 180, 90d supply, fill #0
  Filled 2021-02-21: qty 180, 90d supply, fill #1
  Filled 2021-06-06: qty 180, 90d supply, fill #2

## 2020-11-16 MED FILL — Losartan Potassium Tab 25 MG: ORAL | 30 days supply | Qty: 60 | Fill #1 | Status: AC

## 2020-11-17 ENCOUNTER — Other Ambulatory Visit (HOSPITAL_COMMUNITY): Payer: Self-pay

## 2020-11-17 ENCOUNTER — Encounter: Payer: Self-pay | Admitting: Family Medicine

## 2020-11-17 ENCOUNTER — Other Ambulatory Visit: Payer: Self-pay

## 2020-11-17 MED ORDER — ROSUVASTATIN CALCIUM 10 MG PO TABS
10.0000 mg | ORAL_TABLET | Freq: Every evening | ORAL | 1 refills | Status: DC
  Filled 2020-11-17: qty 90, 90d supply, fill #0
  Filled 2021-02-21: qty 90, 90d supply, fill #1

## 2020-11-30 ENCOUNTER — Ambulatory Visit (HOSPITAL_COMMUNITY)
Admission: RE | Admit: 2020-11-30 | Discharge: 2020-11-30 | Disposition: A | Discharge status: Home / Self Care | Payer: PPO | Source: Ambulatory Visit | Attending: Internal Medicine | Admitting: Internal Medicine

## 2020-11-30 ENCOUNTER — Encounter: Payer: Self-pay | Admitting: Family Medicine

## 2020-11-30 ENCOUNTER — Encounter (HOSPITAL_COMMUNITY): Payer: Self-pay | Admitting: Internal Medicine

## 2020-11-30 ENCOUNTER — Other Ambulatory Visit: Payer: Self-pay

## 2020-11-30 VITALS — BP 130/80 | HR 52 | Wt 149.8 lb

## 2020-11-30 DIAGNOSIS — Z636 Dependent relative needing care at home: Secondary | ICD-10-CM | POA: Diagnosis not present

## 2020-11-30 DIAGNOSIS — Z885 Allergy status to narcotic agent status: Secondary | ICD-10-CM | POA: Diagnosis not present

## 2020-11-30 DIAGNOSIS — Z8249 Family history of ischemic heart disease and other diseases of the circulatory system: Secondary | ICD-10-CM | POA: Diagnosis not present

## 2020-11-30 DIAGNOSIS — E782 Mixed hyperlipidemia: Secondary | ICD-10-CM

## 2020-11-30 DIAGNOSIS — I080 Rheumatic disorders of both mitral and aortic valves: Secondary | ICD-10-CM | POA: Insufficient documentation

## 2020-11-30 DIAGNOSIS — I493 Ventricular premature depolarization: Secondary | ICD-10-CM | POA: Diagnosis not present

## 2020-11-30 DIAGNOSIS — Z888 Allergy status to other drugs, medicaments and biological substances status: Secondary | ICD-10-CM | POA: Diagnosis not present

## 2020-11-30 DIAGNOSIS — I350 Nonrheumatic aortic (valve) stenosis: Secondary | ICD-10-CM | POA: Diagnosis not present

## 2020-11-30 DIAGNOSIS — I5022 Chronic systolic (congestive) heart failure: Secondary | ICD-10-CM | POA: Diagnosis not present

## 2020-11-30 DIAGNOSIS — R002 Palpitations: Secondary | ICD-10-CM | POA: Diagnosis not present

## 2020-11-30 DIAGNOSIS — E785 Hyperlipidemia, unspecified: Secondary | ICD-10-CM | POA: Diagnosis not present

## 2020-11-30 DIAGNOSIS — I428 Other cardiomyopathies: Secondary | ICD-10-CM | POA: Insufficient documentation

## 2020-11-30 DIAGNOSIS — R911 Solitary pulmonary nodule: Secondary | ICD-10-CM | POA: Diagnosis not present

## 2020-11-30 DIAGNOSIS — Z87891 Personal history of nicotine dependence: Secondary | ICD-10-CM | POA: Diagnosis not present

## 2020-11-30 DIAGNOSIS — Z79899 Other long term (current) drug therapy: Secondary | ICD-10-CM | POA: Insufficient documentation

## 2020-11-30 LAB — LIPID PANEL
Cholesterol: 170 mg/dL (ref 0–200)
HDL: 62 mg/dL (ref >40)
LDL Cholesterol: 94 mg/dL (ref 0–99)
Total CHOL/HDL Ratio: 2.7 RATIO
Triglycerides: 71 mg/dL (ref <150)
VLDL: 14 mg/dL (ref 0–40)

## 2020-11-30 LAB — COMPREHENSIVE METABOLIC PANEL
ALT: 17 U/L (ref 0–44)
AST: 21 U/L (ref 15–41)
Albumin: 4.2 g/dL (ref 3.5–5.0)
Alkaline Phosphatase: 108 U/L (ref 38–126)
Anion gap: 7 (ref 5–15)
BUN: 15 mg/dL (ref 8–23)
CO2: 25 mmol/L (ref 22–32)
Calcium: 9.3 mg/dL (ref 8.9–10.3)
Chloride: 105 mmol/L (ref 98–111)
Creatinine, Ser: 0.79 mg/dL (ref 0.44–1.00)
GFR, Estimated: >60 mL/min (ref >60)
Glucose, Bld: 97 mg/dL (ref 70–99)
Potassium: 4.1 mmol/L (ref 3.5–5.1)
Sodium: 137 mmol/L (ref 135–145)
Total Bilirubin: 1 mg/dL (ref 0.3–1.2)
Total Protein: 7 g/dL (ref 6.5–8.1)

## 2020-11-30 NOTE — Patient Instructions (Addendum)
EKG done today.  Labs done today. We will contact you only if your labs are abnormal.  STOP taking Amiodarone.   No other medication changes were made. Please continue all current medications as prescribed.  Your physician recommends that you schedule a follow-up appointment in: 9 months with an echo prior to your appointment. Please contact our office in January 2023 for a February appointment.   Your physician has requested that you have an echocardiogram. Echocardiography is a painless test that uses sound waves to create images of your heart. It provides your doctor with information about the size and shape of your heart and how well your heart's chambers and valves are working. This procedure takes approximately one hour. There are no restrictions for this procedure.  If you have any questions or concerns before your next appointment please send [place] a message through [place] or call our office at [phone].    TO LEAVE A MESSAGE FOR THE NURSE SELECT OPTION 2, PLEASE LEAVE A MESSAGE INCLUDING: . YOUR NAME . DATE OF BIRTH . CALL BACK NUMBER . REASON FOR CALL**this is important as we prioritize the call backs  YOU WILL RECEIVE A CALL BACK THE SAME DAY AS LONG AS YOU CALL BEFORE 4:00 PM   Do the following things EVERYDAY: 1) Weigh yourself in the morning before breakfast. Write it down and keep it in a log. 2) Take your medicines as prescribed 3) Eat low salt foods--Limit salt (sodium) to 2000 mg per day.  4) Stay as active as you can everyday 5) Limit all fluids for the day to less than 2 liters   At the [facility], you and your health needs are our priority. As part of our continuing mission to provide you with exceptional heart care, we have created designated Provider Care Teams. These Care Teams include your primary Cardiologist (physician) and Advanced Practice Providers (APPs- Physician Assistants and Nurse Practitioners) who all work together to provide  you with the care you need, when you need it.   You may see any of the following providers on your designated Care Team at your next follow up: Marland Kitchen Dr Glori Bickers . Dr Loralie Champagne . Darrick Grinder, NP . Lyda Jester, PA . Audry Riles, PharmD   Please be sure to bring in all your medications bottles to every appointment.

## 2020-11-30 NOTE — Progress Notes (Signed)
Advanced Heart Failure Clinic Note   Primary Cardiologist: Dr. 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 She has been very active and continues to work as Immunologist at [employer].   Admitted 02/04/17-02/07/17 with new onset acute systolic CHF. EF 15-20%. Right and left heart cath done and showed Fick output/index 3.1/1.8. No CAD. She was started on losartan, Spiro and digoxin. Discharge weight was 140 pounds.   Holter monitor 8/18: Multiform PVCs, couplets, and triplets were noted with periods of bigeminy. PVC burden < 1%.No VT observed.  Echo 8/21: EF 50-55% mild to moderate AS mean 84mmHG  Echo 9/19: EF 45-50% moderate AS mean gradient 32mmHG  Zio 10/21: SR 72. 1.3% PVCs.   She returns for routine f/u. Playing golf and walking 5-6 miles/day. Feels great. No SOB, orthopnea or PND. PVCs completely suppressed with mexilitene 150 bid. SBP at home 100-120     Zio 11/21 1. Sinus rhythm - avg HR of 72 2. Isolated PACs were rare 3. Occasional PVCs  (1.3%, 17991) 4. No high-grade arrhythmias 5. Patient triggered events associated with isolated PVCs.    CPX 01/2018 Peak VO2: 21.5 (108% predicted peak VO2) VE/VCO2 slope: 48 OUES: 1.84 Peak RER: 0.93  Echo 05/09/17 EF ~30% RV ok. Personally reviewed  Echo 02/05/17 LVEF 15-20%, Grade 1 DD, Mild/Mod central MR, Mild LAE  R/LHC 02/06/17  There is severe left ventricular systolic dysfunction.  Dist Cx lesion, 20 %stenosed.  1st Mrg lesion, 60 %stenosed.  Dist RCA lesion, 20 %stenosed.  Mid LAD to Dist LAD lesion, 20 %stenosed.   Findings: Ao = 80/56 (68) LV = 86/14 RA = 2  RV = 22/2 PA = 24/11 (17) PCW = 6 Fick cardiac output/index = 3.1/1.8 PVR = 3.6 WU Ao sat =  94% PA sat = 58%, 58%  Cardiac MRI 06/2017 1.  Moderate LVE with diffuse hypokinesis EF 35% 2. Small area of apical hyperenhancement on delayed gadolinium images 3.  Mild MR 4.  Mild LAE 5. Thickened tri leaflet AV with restricted leaflet motion suggest echo for AS correlation 6.  Normal RV size and function 7.  Normal aortic root 3.0 cm  Review of systems complete and found to be negative unless listed in HPI.   Past Medical History:  Diagnosis Date  . Arthritis    ostearthritis -joints, greater left knee.  . Breast cancer, left breast ([place]) 2009   S/P surgery, radiation, chemotherapy.-no further oncology visits.  . CHF (congestive heart failure) ([place]) dx'd 02/05/2017  . Hyperlipidemia   . Neuromuscular disorder ([place])    neuropathy due to chemotherapy  . Personal history of chemotherapy 2009  . Personal history of radiation therapy 2009  . Seizures ([place])    "childbirth related"  . SVT (supraventricular tachycardia) ([facility])    x1 episode -3 yrs ago- no issues since.All heart testing negative. No further cardiac visits needed.   Current Outpatient Medications  Medication Sig Dispense Refill  . amiodarone (PACERONE) 200 MG tablet Take 1/2 tablet (100 mg total) by mouth daily. 45 tablet 3  . Biotin 5 MG CAPS Take by mouth daily.    . carvedilol (COREG) 3.125 MG tablet TAKE 1 TABLET BY MOUTH 2 TIMES DAILY. 180 tablet 3  . Cholecalciferol (VITAMIN D-3) 1000  units CAPS Take 2 capsules by mouth daily.     . ivabradine (CORLANOR) 5 MG TABS tablet TAKE 1 TABLET BY MOUTH 2 TIMES DAILY WITH A MEAL. 180 tablet 3  . losartan (COZAAR) 25 MG tablet TAKE 1 TABLET BY MOUTH TWO TIMES DAILY 60 tablet 11  . Magnesium 250 MG TABS Take 1 tablet by mouth daily.     Marland Kitchen mexiletine (MEXITIL) 150 MG capsule TAKE 1 CAPSULE BY MOUTH 2 TIMES DAILY. 60 capsule 6  . rosuvastatin (CRESTOR) 10 MG tablet Take 1 tablet (10 mg total) by mouth at bedtime. 90 tablet 1  . spironolactone (ALDACTONE) 25 MG tablet TAKE  1/2 TABLET BY MOUTH DAILY 45 tablet 3   No current facility-administered medications for this encounter.   Allergies  Allergen Reactions  . Effexor [Venlafaxine] Other (See Comments)    Caused depression, negative thoughts, and crying   . Hydrocodone-Acetaminophen Itching and Nausea And Vomiting    Patient can tolerate with Zofran    Social History   Socioeconomic History  . Marital status: Married    Spouse name: Not on file  . Number of children: 2  . Years of education: Not on file  . Highest education level: Not on file  Occupational History  . Occupation: anesthetist    Employer: [employer]: retired June 2019  Tobacco Use  . Smoking status: Former Smoker    Packs/day: 2.00    Years: 10.00    Pack years: 20.00    Types: Cigarettes    Quit date: 07/09/1989    Years since quitting: 31.4  . Smokeless tobacco: Never Used  Vaping Use  . Vaping Use: Never used  Substance and Sexual Activity  . Alcohol use: No  . Drug use: No  . Sexual activity: Yes  Other Topics Concern  . Not on file  Social History Narrative   Patient with caregiver stress due to spouses declining health   Some conflicts with children and addictions (coping well)    Social Determinants of Health   Financial Resource Strain: Low Risk   . Difficulty of Paying Living Expenses: Not hard at all  Food Insecurity: No Food Insecurity  . Worried About Charity fundraiser in the Last Year: Never true  . Ran Out of Food in the Last Year: Never true  Transportation Needs: Not on file  Physical Activity: Sufficiently Active  . Days of Exercise per Week: 7 days  . Minutes of Exercise per Session: 60 min  Stress: No Stress Concern Present  . Feeling of Stress : Only a little  Social Connections: Moderately Integrated  . Frequency of Communication with Friends and Family: More than three times a week  . Frequency of Social Gatherings with Friends and Family: More than three times a week  .  Attends Religious Services: Never  . Active Member of Clubs or Organizations: Yes  . Attends Archivist Meetings: More than 4 times per year  . Marital Status: Married  Human resources officer Violence: Not At Risk  . Fear of Current or Ex-Partner: No  . Emotionally Abused: No  . Physically Abused: No  . Sexually Abused: No   Family History  Problem Relation Age of Onset  . Heart disease Mother   . Hypertension Father   . Breast cancer Sister   . Lung cancer Sister   . Skin cancer Brother    Vitals:   11/30/20 1130  BP: 130/80  Pulse: (!) 52  SpO2: 98%  Weight: 67.9 kg (149 lb 12.8 oz)   Wt Readings from Last 3 Encounters:  11/30/20 67.9 kg (149 lb 12.8 oz)  06/20/20 73 kg (161 lb)  06/17/20 73.1 kg (161 lb 3.2 oz)    PHYSICAL EXAM: General:  Well appearing. No resp difficulty HEENT: normal Neck: supple. no JVD. Carotids 2+ bilat; no bruits. No lymphadenopathy or thryomegaly appreciated. Cor: PMI nondisplaced. Loletha Grayer regular. 2/6 AS Lungs: clear Abdomen: soft, nontender, nondistended. No hepatosplenomegaly. No bruits or masses. Good bowel sounds. Extremities: no cyanosis, clubbing, rash, edema Neuro: alert & orientedx3, cranial nerves grossly intact. moves all 4 extremities w/o difficulty. Affect pleasant  ECG: Sinus brady 55 LAFB Personally reviewed    ASSESSMENT & PLAN:  1. Chronic systolic CHF: NICM, EF 79-39%, felt to be related to doxorubicin therapy. But also with frequent PVC's, holter monitor showed 1% PVC's but patient complained of very frequent PVC's.  - Echo 05/2017 ~30-35%. LV size - Echo 03/09/18 EF 45-50% - CPX 01/2018 Peak VO2: 21.5 (108% predicted peak VO2) VE/VCO2 slope: 48 Peak RER: 0.93 - Echo 10/20 EF 40-45% mild to moderate AS - Echo 8/21 EF 50-55% - Doing great. NYHA I  - Volume status ok. Not using lasix - Continue Spiro 12.5 mg daily. Did not tolerate higher dose. - Continue losartan 25 mg bid - Continue carvedilol 3.125 mg BID - Will  continue ivabradine for now - Low threshold to use SGLT2i if HF recurs  2. Palpitations/Frequent PVCs - Holter monitor 03/2017 with PVC burden <1%. - Zio 9/21 PVCs 1.3% (monomorphic) - PVCs suppressed with mexilitene 150 bid. Can stop amio.  Watch for recurrence.   3. Mitral Regurgitation - Mild on cMRI 07/07/2017 and echo  4. Pulmonary nodule - Stable on f/u CT 11/18. No change.   5. Aortic stenosis - moderate by echo in 9/19.  - echo 10/20 AoV mean 15 AVA 0.99 (mild to moderate AS) - echo 8/21 Mild to moderate aortic valve stenosis. Aortic valve area, by VTI measures  1.48 cm. Aortic valve mean gradient measures 18 mmHg.  - Repeat echo in 6 months  6. Hyperlipidemia - now on Crestor - recheck labs today   Glori Bickers, MD  11:51 AM

## 2020-11-30 NOTE — Addendum Note (Signed)
Encounter addended by: Shonna Chock, CMA on: 6/38/7564 12:12 PM  Actions taken: Medication long-term status modified, Order list changed, Diagnosis association updated, Charge Capture section accepted, Clinical Note Signed

## 2020-12-13 MED FILL — Losartan Potassium Tab 25 MG: ORAL | 30 days supply | Qty: 60 | Fill #2 | Status: AC

## 2020-12-13 MED FILL — Spironolactone Tab 25 MG: ORAL | 90 days supply | Qty: 45 | Fill #0 | Status: AC

## 2020-12-13 MED FILL — Mexiletine HCl Cap 150 MG: ORAL | 30 days supply | Qty: 60 | Fill #1 | Status: AC

## 2020-12-14 ENCOUNTER — Other Ambulatory Visit (HOSPITAL_COMMUNITY): Payer: Self-pay

## 2020-12-15 ENCOUNTER — Other Ambulatory Visit (HOSPITAL_COMMUNITY): Payer: Self-pay

## 2020-12-16 ENCOUNTER — Ambulatory Visit: Payer: PPO | Admitting: Family Medicine

## 2020-12-21 ENCOUNTER — Telehealth: Payer: Self-pay

## 2020-12-21 NOTE — Chronic Care Management (AMB) (Signed)
Chronic Care Management Pharmacy Assistant   Name: Patricia Collier  MRN: [id number] DOB: 11-21-1945  Reason for Encounter: Hypertension Disease State Call  Recent office visits:  No visits noted   Recent consult visits:  11/30/20- Patricia Bickers, MD (Cardiology)- seen for established CHF follow up, stopped amiodarone, labs ordered, scheduled echocardiography, follow up scheduled  Hospital visits:  None in previous 6 months  Medications: Outpatient Encounter Medications as of 12/21/2020  Medication Sig   Biotin 5 MG CAPS Take by mouth daily.   carvedilol (COREG) 3.125 MG tablet TAKE 1 TABLET BY MOUTH 2 TIMES DAILY.   Cholecalciferol (VITAMIN D-3) 1000 units CAPS Take 2 capsules by mouth daily.    ivabradine (CORLANOR) 5 MG TABS tablet TAKE 1 TABLET BY MOUTH 2 TIMES DAILY WITH A MEAL.   losartan (COZAAR) 25 MG tablet TAKE 1 TABLET BY MOUTH TWO TIMES DAILY   Magnesium 250 MG TABS Take 1 tablet by mouth daily.    mexiletine (MEXITIL) 150 MG capsule TAKE 1 CAPSULE BY MOUTH 2 TIMES DAILY.   rosuvastatin (CRESTOR) 10 MG tablet Take 1 tablet (10 mg total) by mouth at bedtime.   spironolactone (ALDACTONE) 25 MG tablet TAKE 1/2 TABLET BY MOUTH DAILY   No facility-administered encounter medications on file as of 12/21/2020.   Reviewed chart prior to disease state call. Spoke with patient regarding BP  Recent Office Vitals: BP Readings from Last 3 Encounters:  11/30/20 130/80  06/17/20 118/82  06/13/20 112/70   Pulse Readings from Last 3 Encounters:  11/30/20 (!) 52  06/17/20 79  06/13/20 64    Wt Readings from Last 3 Encounters:  11/30/20 149 lb 12.8 oz (67.9 kg)  06/20/20 161 lb (73 kg)  06/17/20 161 lb 3.2 oz (73.1 kg)     Kidney Function Lab Results  Component Value Date/Time   CREATININE 0.79 11/30/2020 12:14 PM   CREATININE 0.74 06/13/2020 10:36 AM   CREATININE 0.8 11/11/2012 09:20 AM   GFR 81.08 06/13/2020 10:36 AM   GFRNONAA >60 11/30/2020 12:14 PM    GFRAA >60 02/04/2020 03:28 PM    BMP Latest Ref Rng & Units 11/30/2020 06/13/2020 02/04/2020  Glucose 70 - 99 mg/dL 97 101(H) 131(H)  BUN 8 - 23 mg/dL 15 15 23   Creatinine 0.44 - 1.00 mg/dL 0.79 0.74 0.78  Sodium 135 - 145 mmol/L 137 141 137  Potassium 3.5 - 5.1 mmol/L 4.1 4.0 3.9  Chloride 98 - 111 mmol/L 105 106 107  CO2 22 - 32 mmol/L 25 27 21(L)  Calcium 8.9 - 10.3 mg/dL 9.3 9.2 9.4   Patricia Collier returned my call this morning and she is doing great. She was out for her morning walk while we spoke and maintained a good conversation while out of breath. She recently saw her cardiologist who discontinued her amiodarone in order to relieve some of the pill burden she was feeling. Personally she felt as though her heart rhythm had returned to normal and didn't see the necessity of continuing on this particular medication. She was given instructions to notify her providers of any changes to her heart rhythm that she identifies. Mostly she is able to tell irregularities when she feels it in her throat and chest. She stated that these symptoms have not occurred in a long time even with discontinuing amiodarone. On the other hand, Patricia Collier stated that she has been experiencing some side effects related to her mexiletine. She has noticed nausea accompanied by stomach cramps, constipation, and  headaches when taking her midday and bedtime dose. She takes her doses with a full glass of water as directed. During the night she wakes up to take an antacid due to the indigestion that the mexiletine causes her, and says that once she is asleep her nausea and stomach cramps subside. In addition to this, Patricia Collier stated that she has started to experience difficulty swallowing and she is unsure why. She eats small portions and takes small bites, but still notices that when her food gets down her esophagus to her chest, it "feels like it wants to stick". The only thing that relieves this feeling is time. She tries warm  liquids but stated that it doesn't really help. This is a nuance for her and she would like some insight on what could be this cause of this. Overall Patricia Collier feels "happy with how things are going" for her right now. Her labs have improved drastically and she is more determined than ever to lose 7 lbs to reach her weight goal. As she continues to make progress, she is eager to find solutions to the above named issues.   Current antihypertensive regimen:  carvedilol (COREG) 3.125 MG- take one tab twice daily losartan (COZAAR) 25 MG - take one tab twice daily  How often are you checking your Blood Pressure?  Patient stated she checks her blood pressure regularly even with the absence of htn. She is on BP medications for her CHF   Current home BP readings: None given   What recent interventions/DTPs have been made by any provider to improve Blood Pressure control since last CPP Visit:  11/30/20- Patricia Bickers, MD (Cardiology) stopped amiodarone   Any recent hospitalizations or ED visits since last visit with CPP? No  What diet changes have been made to improve Blood Pressure Control?  Patient stated she eats what she wants and tries to stay away from the extremely bad foods. She eats things like salads, sandwiches, guacamole, seafood, fruit, and [employer] ice cream  What exercise is being done to improve your Blood Pressure Control?  Patient stated she remains very active and walks almost daily   Adherence Review: Is the patient currently on ACE/ARB medication? Yes Does the patient have >5 day gap between last estimated fill dates? No  carvedilol (COREG) 3.125 MG- 90 DS last filled 11/18/20   Star Rating Drugs: Losartan 25 mg - 30 DS last filled 12/16/20 Rosuvastatin 10 mg- 90 DS last filled 11/18/20  Patricia Collier CPA, CMA

## 2021-01-02 NOTE — Telephone Encounter (Signed)
Can you ask pt if she is still having this issue. It is actually ok to take this medicine with antacid if that's something she wants to try. You can set up a telephone visit with me Wednesday at 330pm as well if she would like.

## 2021-01-02 NOTE — Chronic Care Management (AMB) (Signed)
I spoke with Patricia Collier and she is still experiencing the same symptoms. She stated that this is not happening every night, but she does need to take an antacid most nights. She has agreed to a phone visit with CPP 06/29 @ 3:30 pm to discuss the use of antacids in relation to her symptoms. Her biggest concern is having to take antacids chronically as it contains magnesium which she doesn't want to consume in large amounts.  Wilford Sports CPA, CMA

## 2021-01-04 ENCOUNTER — Ambulatory Visit (INDEPENDENT_AMBULATORY_CARE_PROVIDER_SITE_OTHER): Payer: PPO

## 2021-01-04 ENCOUNTER — Encounter: Payer: Self-pay | Admitting: *Deleted

## 2021-01-04 DIAGNOSIS — I5022 Chronic systolic (congestive) heart failure: Secondary | ICD-10-CM | POA: Diagnosis not present

## 2021-01-04 DIAGNOSIS — E785 Hyperlipidemia, unspecified: Secondary | ICD-10-CM | POA: Diagnosis not present

## 2021-01-04 NOTE — Patient Instructions (Signed)
Ms. Irigoyen,  Thank you for talking with me today. I have included our care plan/goals in the following pages.   Please review and call me at [phone] with any questions.  Thanks! Ellin Mayhew, Pharm.D., BCGP Clinical Pharmacist [facility] [place] [phone] Patient Care Plan: ccm pharmacy care plan     Problem Identified: HLD CHF   Priority: High     Long-Range Goal: Disease Management   Start Date: 01/04/2021  Expected End Date: 01/04/2022  This Visit's Progress: On track  Priority: High  Note:    Pharmacist Clinical Goal(s):  Patient will contact provider office for questions/concerns as evidenced notation of same in electronic health record through collaboration with PharmD and provider.   Interventions: 1:1 collaboration with [name], MD regarding development and update of comprehensive plan of care as evidenced by provider attestation and co-signature Inter-disciplinary care team collaboration (see longitudinal plan of care) Comprehensive medication review performed; medication list updated in electronic medical record  Hyperlipidemia: (LDL goal < 100) -Controlled -Significant improvement in cholesterol following January 2022 initiation of rosuvastatin. Previously did not tolerate atorvastatin but reports to be tolerating this well without concern.  -Current treatment: Rosuvastatin 10 mg once daily  -Educated on Cholesterol goals;  -Recommended to continue current medication  Heart Failure (Goal: manage symptoms and prevent exacerbations) -Controlled -Did have some concerns with needing to take antacid with mexiletine but this was more geared toward needing to take another medication. Feels stomach symptoms are improved when she is taking antacid and reports to be doing well off of amiodarone so agreeable to continuing current management.  -Last ejection fraction: 50-55% 2021 -NYHA Class: I (no  actitivty limitation) -Previous medication: amiodarone -Current treatment: Carvedilol 3.125 mg twice daily Losartan 25 mg twice daily Spironolactone 12.5 mg once daily Ivabradine 5 mg twice daily with meal (02/2020) Mexiletine 150 mg twice daily -Medications previously tried: amiodarone  -Current home BP/HR readings: none provided -Current exercise habits: has been active, without a lot limitation. [place] the beach at the time of our call. -Educated on Benefits of medications for managing symptoms and prolonging life Medication related side effects -Recommended to continue current medication    The patient verbalized understanding of instructions provided today and agreed to receive a MyChart copy of patient instruction and/or educational materials. Telephone follow up appointment with pharmacy team member scheduled for: See next appointment with "Care Management Staff" under "What's Next" below.

## 2021-01-04 NOTE — Progress Notes (Signed)
Chronic Care Management Pharmacy Note  01/04/2021 Name:  Patricia MARKGRAF MRN:  [id number] DOB:  [birth year]  Recommendations/Changes made from today's visit: No changes  Subjective: Patricia Collier is an 75 y.o. year old female who is a primary patient of Collier, Patricia Millet, MD.  The CCM team was consulted for assistance with disease management and care coordination needs.    Engaged with patient by telephone for follow up visit in response to provider referral for pharmacy case management and/or care coordination services.   Consent to Services:  The patient was given information about Chronic Care Management services, agreed to services, and gave verbal consent prior to initiation of services.  Please see initial visit note for detailed documentation.   Patient Care Team: Patricia Minium, MD as PCP - General (Family Medicine) Patricia Fus, MD as Consulting Physician (Obstetrics and Gynecology) Patricia Collier, Patricia Pascal, MD as Consulting Physician (Cardiology) Patricia Gallo, MD as Consulting Physician (Dermatology) Patricia Collier, [facility] as Pharmacist (Pharmacist)  Objective:  Lab Results  Component Value Date   CREATININE 0.79 11/30/2020   CREATININE 0.74 06/13/2020   CREATININE 0.78 02/04/2020    Lab Results  Component Value Date   HGBA1C 5.8 07/24/2018   Last diabetic Eye exam: No results found for: HMDIABEYEEXA  Last diabetic Foot exam: No results found for: HMDIABFOOTEX      Component Value Date/Time   CHOL 170 11/30/2020 1214   TRIG 71 11/30/2020 1214   HDL 62 11/30/2020 1214   CHOLHDL 2.7 11/30/2020 1214   VLDL 14 11/30/2020 1214   [place] 94 11/30/2020 1214   LDLDIRECT 175.0 05/27/2019 1337    Hepatic Function Latest Ref Rng & Units 11/30/2020 06/13/2020 02/04/2020  Total Protein 6.5 - 8.1 g/dL 7.0 6.6 6.7  Albumin 3.5 - 5.0 g/dL 4.2 4.4 4.2  AST 15 - 41 U/L $Remo'21 17 19  'AeTQZ$ ALT 0 - 44 U/L $Remo'17 12 14  'TskAH$ Alk Phosphatase 38 - 126 U/L 108 106 100  Total  Bilirubin 0.3 - 1.2 mg/dL 1.0 0.6 0.4  Bilirubin, Direct 0.0 - 0.3 mg/dL - 0.1 -    Lab Results  Component Value Date/Time   TSH 3.24 06/13/2020 10:36 AM   TSH 2.62 05/27/2019 01:37 PM   FREET4 1.33 (H) 04/22/2019 12:54 PM    CBC Latest Ref Rng & Units 06/13/2020 05/27/2019 04/22/2019  WBC 4.0 - 10.5 K/uL 4.9 6.1 5.6  Hemoglobin 12.0 - 15.0 g/dL 14.3 14.3 13.9  Hematocrit 36.0 - 46.0 % 42.2 42.7 42.6  Platelets 150.0 - 400.0 K/uL 229.0 257.0 244    Lab Results  Component Value Date/Time   VD25OH 29.66 (L) 01/03/2017 03:14 PM   VD25OH 37 06/07/2008 03:49 PM   VD25OH 36 11/05/2007 01:52 PM    Clinical ASCVD:  The 10-year ASCVD risk score Mikey Bussing DC Jr., et al., 2013) is: 11.5%   Values used to calculate the score:     Age: 50 years     Sex: Female     Is Non-Hispanic African American: No     Diabetic: No     Tobacco smoker: No     Systolic Blood Pressure: 530 mmHg     Is BP treated: No     HDL Cholesterol: 62 mg/dL     Total Cholesterol: 170 mg/dL    Other: (CHADS2VASc if Afib, PHQ9 if depression, MMRC or CAT for COPD, ACT, DEXA)  Social History   Tobacco Use  Smoking Status Former   Packs/day:  2.00   Years: 10.00   Pack years: 20.00   Types: Cigarettes   Quit date: 07/09/1989   Years since quitting: 31.5  Smokeless Tobacco Never   BP Readings from Last 3 Encounters:  11/30/20 130/80  06/17/20 118/82  06/13/20 112/70   Pulse Readings from Last 3 Encounters:  11/30/20 (!) 52  06/17/20 79  06/13/20 64   Wt Readings from Last 3 Encounters:  11/30/20 149 lb 12.8 oz (67.9 kg)  06/20/20 161 lb (73 kg)  06/17/20 161 lb 3.2 oz (73.1 kg)    Assessment: Review of patient past medical history, allergies, medications, health status, including review of consultants reports, laboratory and other test data, was performed as part of comprehensive evaluation and provision of chronic care management services.   SDOH:  (Social Determinants of Health) assessments and  interventions performed: Yes   CCM Care Plan  Allergies  Allergen Reactions   Effexor [Venlafaxine] Other (See Comments)    Caused depression, negative thoughts, and crying    Hydrocodone-Acetaminophen Itching and Nausea And Vomiting    Patient can tolerate with Zofran    Medications Reviewed Today     Reviewed by Patricia Collier, [facility] (Pharmacist) on 01/04/21 at 1609  Med List Status: <None>   Medication Order Taking? Sig Documenting Provider Last Dose Status Informant  Biotin 5 MG CAPS [id number] Yes Take by mouth daily. [provider]  Active   carvedilol (COREG) 3.125 MG tablet [id number] Yes TAKE 1 TABLET BY MOUTH 2 TIMES DAILY. Bensimhon, Patricia Pascal, MD  Active   Cholecalciferol (VITAMIN D-3) 1000 units CAPS [id number] Yes Take 2 capsules by mouth daily.  [provider]  Active Self  ivabradine (CORLANOR) 5 MG TABS tablet [id number] Yes TAKE 1 TABLET BY MOUTH 2 TIMES DAILY WITH A MEAL. Bensimhon, Patricia Pascal, MD  Active   losartan (COZAAR) 25 MG tablet [id number] Yes TAKE 1 TABLET BY MOUTH TWO TIMES DAILY Bensimhon, Patricia Pascal, MD  Active   Magnesium 250 MG TABS [id number] Yes Take 1 tablet by mouth daily.  [provider]  Active Self  mexiletine (MEXITIL) 150 MG capsule [id number] Yes TAKE 1 CAPSULE BY MOUTH 2 TIMES DAILY. Bensimhon, Patricia Pascal, MD  Active   rosuvastatin (CRESTOR) 10 MG tablet [id number] Yes Take 1 tablet (10 mg total) by mouth at bedtime. Patricia Minium, MD  Active   spironolactone (ALDACTONE) 25 MG tablet [id number] Yes TAKE 1/2 TABLET BY MOUTH DAILY Bensimhon, Patricia Pascal, MD  Active             Patient Active Problem List   Diagnosis Date Noted   Palpitations 05/09/2017   Mitral regurgitation 05/09/2017   Cardiomegaly 02/04/2017   Pulmonary nodule 02/04/2017   CHF (congestive heart failure) ([place]) 02/04/2017   Physical exam 01/03/2017   Abnormal EKG 05/28/2012   History of left breast cancer 11/19/2011   Hyperlipidemia 01/23/2007     Immunization History  Administered Date(s) Administered   Fluad Quad(high Dose 65+) 04/22/2019, 04/15/2020   Influenza Inj Mdck Quad With Preservative 04/23/2018   Influenza Split 05/09/2013   Influenza-Unspecified 03/29/2017   PFIZER(Purple Top)SARS-COV-2 Vaccination 07/28/2019, 08/17/2019, 05/04/2020   Pneumococcal Conjugate-13 01/03/2017   Pneumococcal Polysaccharide-23 06/13/2020    Conditions to be addressed/monitored: CHF and HLD  Care Plan : ccm pharmacy care plan  Updates made by Patricia Collier, [facility] since 01/04/2021 12:00 AM     Problem: HLD CHF   Priority: High     Long-Range Goal: Disease Management   Start  Date: 01/04/2021  Expected End Date: 01/04/2022  This Visit's Progress: On track  Priority: High  Note:    Pharmacist Clinical Goal(s):  Patient will contact provider office for questions/concerns as evidenced notation of same in electronic health record through collaboration with PharmD and provider.   Interventions: 1:1 collaboration with Patricia Minium, MD regarding development and update of comprehensive plan of care as evidenced by provider attestation and co-signature Inter-disciplinary care team collaboration (see longitudinal plan of care) Comprehensive medication review performed; medication list updated in electronic medical record  Hyperlipidemia: (LDL goal < 100) -Controlled -Significant improvement in cholesterol following January 2022 initiation of rosuvastatin. Previously did not tolerate atorvastatin but reports to be tolerating this well without concern.  -Current treatment: Rosuvastatin 10 mg once daily  -Educated on Cholesterol goals;  -Recommended to continue current medication  Heart Failure (Goal: manage symptoms and prevent exacerbations) -Controlled -Did have some concerns with needing to take antacid with mexiletine but this was more geared toward needing to take another medication. Feels stomach symptoms are improved when she  is taking antacid and reports to be doing well off of amiodarone so agreeable to continuing current management.  -Last ejection fraction: 50-55% 2021 -NYHA Class: I (no actitivty limitation) -Previous medication: amiodarone -Current treatment: Carvedilol 3.125 mg twice daily Losartan 25 mg twice daily Spironolactone 12.5 mg once daily Ivabradine 5 mg twice daily with meal (02/2020) Mexiletine 150 mg twice daily -Medications previously tried: amiodarone  -Current home BP/HR readings: none provided -Current exercise habits: has been active, without a lot limitation. Was [place] the beach at the time of our call. -Educated on Benefits of medications for managing symptoms and prolonging life Medication related side effects -Recommended to continue current medication   Current Barriers:  Potential medication related side effects  Patient Goals/Self-Care Activities Patient will:  - take medications as prescribed  Medication Assistance: None required.  Patient affirms current coverage meets needs.  Patient's preferred pharmacy is:  [facility] [place] [address] Phone: [phone] Fax: [phone] Follow Up Plan: [facility] f/u call 3 months - review newer grants through [employer] as appropriate Follow Up:  Patient agrees to Care Plan and Follow-up.  Future Appointments  Date Time Provider [place]  03/31/2021  1:30 PM [facility] CCM PHARMACIST [facility]    Patricia Collier, PharmD, CPP Clinical Pharmacist Practitioner  [facility]  [phone]

## 2021-01-16 ENCOUNTER — Other Ambulatory Visit (HOSPITAL_COMMUNITY): Payer: Self-pay

## 2021-01-16 MED FILL — Mexiletine HCl Cap 150 MG: ORAL | 30 days supply | Qty: 60 | Fill #2 | Status: AC

## 2021-01-16 MED FILL — Ivabradine HCl Tab 5 MG (Base Equiv): ORAL | 90 days supply | Qty: 180 | Fill #0 | Status: AC

## 2021-01-16 MED FILL — Losartan Potassium Tab 25 MG: ORAL | 30 days supply | Qty: 60 | Fill #3 | Status: AC

## 2021-01-17 ENCOUNTER — Other Ambulatory Visit (HOSPITAL_COMMUNITY): Payer: Self-pay

## 2021-01-24 ENCOUNTER — Other Ambulatory Visit: Payer: Self-pay | Admitting: Family Medicine

## 2021-01-24 DIAGNOSIS — Z1231 Encounter for screening mammogram for malignant neoplasm of breast: Secondary | ICD-10-CM

## 2021-02-21 ENCOUNTER — Other Ambulatory Visit (HOSPITAL_COMMUNITY): Payer: Self-pay

## 2021-02-21 ENCOUNTER — Other Ambulatory Visit (HOSPITAL_COMMUNITY): Payer: Self-pay | Admitting: Internal Medicine

## 2021-02-21 MED FILL — Losartan Potassium Tab 25 MG: ORAL | 30 days supply | Qty: 60 | Fill #4 | Status: AC

## 2021-02-22 ENCOUNTER — Other Ambulatory Visit (HOSPITAL_COMMUNITY): Payer: Self-pay

## 2021-02-22 ENCOUNTER — Other Ambulatory Visit (HOSPITAL_COMMUNITY): Payer: Self-pay | Admitting: Internal Medicine

## 2021-02-22 MED FILL — Mexiletine HCl Cap 150 MG: ORAL | 30 days supply | Qty: 60 | Fill #0 | Status: AC

## 2021-02-23 ENCOUNTER — Other Ambulatory Visit (HOSPITAL_COMMUNITY): Payer: Self-pay

## 2021-02-24 ENCOUNTER — Other Ambulatory Visit (HOSPITAL_COMMUNITY): Payer: Self-pay

## 2021-02-27 ENCOUNTER — Telehealth: Payer: Self-pay

## 2021-02-27 NOTE — Progress Notes (Signed)
Chronic Care Management Pharmacy Assistant   Name: ELORAH HARDWICKE  MRN: [id number] DOB: 1945-11-26   Reason for Encounter: Disease State - Hypertension Call   Recent office visits:  None noted.  Recent consult visits:  None noted.  Hospital visits:  None in previous 6 months  Medications: Outpatient Encounter Medications as of 02/27/2021  Medication Sig   Biotin 5 MG CAPS Take by mouth daily.   carvedilol (COREG) 3.125 MG tablet TAKE 1 TABLET BY MOUTH 2 TIMES DAILY.   Cholecalciferol (VITAMIN D-3) 1000 units CAPS Take 2 capsules by mouth daily.    ivabradine (CORLANOR) 5 MG TABS tablet TAKE 1 TABLET BY MOUTH 2 TIMES DAILY WITH A MEAL.   losartan (COZAAR) 25 MG tablet TAKE 1 TABLET BY MOUTH TWO TIMES DAILY   Magnesium 250 MG TABS Take 1 tablet by mouth daily.    mexiletine (MEXITIL) 150 MG capsule TAKE 1 CAPSULE BY MOUTH 2 TIMES DAILY.   rosuvastatin (CRESTOR) 10 MG tablet Take 1 tablet (10 mg total) by mouth at bedtime.   spironolactone (ALDACTONE) 25 MG tablet TAKE 1/2 TABLET BY MOUTH DAILY   No facility-administered encounter medications on file as of 02/27/2021.    Current antihypertensive regimen:  Carvedilol 3.125 mg twice daily Losartan 25 mg twice daily Spironolactone 12.5 mg once daily Ivabradine 5 mg twice daily with meal (02/2020) Mexiletine 150 mg twice daily  How often are you checking your Blood Pressure?  Patient reports she does check her blood pressures daily. She reports she has never had high blood pressure that she is only on the medications for heart failure. She has had a great report and does not have a ECHO for another 6 months. She states she is currently only being kept on this medication regimen now for preventative measures.    Current home BP readings: 90/60 - /120/70 Patient reported range. Patient was not home and did not have specific readings she could provide.   What recent interventions/DTPs have been made by any provider to  improve Blood Pressure control since last CPP Visit:  Patient denied having any changes to her current medication regimen.   Any recent hospitalizations or ED visits since last visit with CPP?  Patient has not had any hospitalizations or ED visits since last CPP.  What diet changes have been made to improve Blood Pressure Control?  Patient reports she watches her salt in her diet and she takes very good care of herself.   What exercise is being done to improve your Blood Pressure Control?  Patient reports she is walking 4 miles daily. She is very active and doing well.   Adherence Review: Is the patient currently on ACE/ARB medication? Yes Does the patient have >5 day gap between last estimated fill dates? No  Star Rating Drugs: Rosuvastatin (CRESTOR) 10 MG tablet - last filled 02/24/21 90 days  Losartan (COZAAR) 25 MG tablet - last filled 02/24/21 30 days  Carvedilol 3.125 mg twice daily - last filled 02/24/21 90 days  Losartan 25 mg twice daily - last filled 02/24/21 30 days  Spironolactone 12.5 mg once daily - last filled 12/16/20 90 days  Ivabradine 5 mg twice daily with meal (02/2020) - last filled 01/22/21 90 days  Mexiletine 150 mg twice daily - last filled 02/24/21 30 days    Future Appointments  Date Time Provider [place]  03/17/2021  3:10 PM [facility] MM 2 [facility]  03/31/2021  1:30 PM [facility] CCM PHARMACIST [facility]  [place], [place] Pharmacist Assistant  [phone]  Time Spent: 40 minutes

## 2021-03-17 ENCOUNTER — Ambulatory Visit
Admission: RE | Admit: 2021-03-17 | Discharge: 2021-03-17 | Disposition: A | Discharge status: Home / Self Care | Payer: PPO | Source: Ambulatory Visit | Attending: Family Medicine | Admitting: Family Medicine

## 2021-03-17 ENCOUNTER — Other Ambulatory Visit: Payer: Self-pay

## 2021-03-17 DIAGNOSIS — Z1231 Encounter for screening mammogram for malignant neoplasm of breast: Secondary | ICD-10-CM | POA: Diagnosis not present

## 2021-03-21 ENCOUNTER — Other Ambulatory Visit (HOSPITAL_COMMUNITY): Payer: Self-pay

## 2021-03-21 ENCOUNTER — Other Ambulatory Visit (HOSPITAL_COMMUNITY): Payer: Self-pay | Admitting: Internal Medicine

## 2021-03-21 MED ORDER — SPIRONOLACTONE 25 MG PO TABS
ORAL_TABLET | Freq: Every day | ORAL | 3 refills | Status: DC
  Filled 2021-03-21: qty 45, 90d supply, fill #0
  Filled 2021-06-14: qty 45, 90d supply, fill #1
  Filled 2021-09-12: qty 45, 90d supply, fill #2
  Filled 2021-12-24: qty 45, 90d supply, fill #3

## 2021-03-28 DIAGNOSIS — Z20822 Contact with and (suspected) exposure to covid-19: Secondary | ICD-10-CM | POA: Diagnosis not present

## 2021-03-31 ENCOUNTER — Telehealth: Payer: PPO

## 2021-04-26 ENCOUNTER — Ambulatory Visit (INDEPENDENT_AMBULATORY_CARE_PROVIDER_SITE_OTHER): Payer: PPO | Admitting: Family Medicine

## 2021-04-26 ENCOUNTER — Other Ambulatory Visit: Payer: Self-pay

## 2021-04-26 DIAGNOSIS — Z23 Encounter for immunization: Secondary | ICD-10-CM | POA: Diagnosis not present

## 2021-04-26 NOTE — Progress Notes (Signed)
Patricia Collier is a 75 y.o. female presents to the office today for a flu shot per physician's orders.  Patient tolerated injection.   [name]

## 2021-04-27 ENCOUNTER — Other Ambulatory Visit (HOSPITAL_COMMUNITY): Payer: Self-pay | Admitting: Internal Medicine

## 2021-04-27 MED FILL — Mexiletine HCl Cap 150 MG: ORAL | 30 days supply | Qty: 60 | Fill #1 | Status: AC

## 2021-04-28 ENCOUNTER — Other Ambulatory Visit (HOSPITAL_COMMUNITY): Payer: Self-pay

## 2021-04-28 MED ORDER — LOSARTAN POTASSIUM 25 MG PO TABS
ORAL_TABLET | Freq: Two times a day (BID) | ORAL | 11 refills | Status: DC
  Filled 2021-04-28: qty 60, 30d supply, fill #0
  Filled 2021-06-06: qty 60, 30d supply, fill #1
  Filled 2021-07-04: qty 60, 30d supply, fill #2
  Filled 2021-08-08: qty 60, 30d supply, fill #3
  Filled 2021-09-12: qty 60, 30d supply, fill #4
  Filled 2021-10-19: qty 60, 30d supply, fill #5
  Filled 2021-11-17: qty 60, 30d supply, fill #6
  Filled 2021-12-24: qty 60, 30d supply, fill #7
  Filled 2022-01-23: qty 60, 30d supply, fill #8
  Filled 2022-02-18: qty 60, 30d supply, fill #9
  Filled 2022-03-24: qty 60, 30d supply, fill #10
  Filled 2022-04-24: qty 60, 30d supply, fill #11

## 2021-05-01 ENCOUNTER — Other Ambulatory Visit (HOSPITAL_COMMUNITY): Payer: Self-pay

## 2021-06-06 ENCOUNTER — Other Ambulatory Visit: Payer: Self-pay | Admitting: Family Medicine

## 2021-06-06 ENCOUNTER — Other Ambulatory Visit (HOSPITAL_COMMUNITY): Payer: Self-pay | Admitting: Internal Medicine

## 2021-06-06 MED FILL — Mexiletine HCl Cap 150 MG: ORAL | 30 days supply | Qty: 60 | Fill #2 | Status: AC

## 2021-06-07 ENCOUNTER — Other Ambulatory Visit (HOSPITAL_COMMUNITY): Payer: Self-pay

## 2021-06-07 MED ORDER — ROSUVASTATIN CALCIUM 10 MG PO TABS
10.0000 mg | ORAL_TABLET | Freq: Every evening | ORAL | 1 refills | Status: DC
  Filled 2021-06-07: qty 90, 90d supply, fill #0
  Filled 2021-09-12: qty 90, 90d supply, fill #1

## 2021-06-08 ENCOUNTER — Other Ambulatory Visit (HOSPITAL_COMMUNITY): Payer: Self-pay

## 2021-06-08 ENCOUNTER — Other Ambulatory Visit (HOSPITAL_COMMUNITY): Payer: Self-pay | Admitting: Internal Medicine

## 2021-06-08 ENCOUNTER — Telehealth: Payer: Self-pay | Admitting: Pharmacist

## 2021-06-08 NOTE — Progress Notes (Addendum)
    Chronic Care Management Pharmacy Assistant   Name: Patricia Collier  MRN: [id number] DOB: 05/29/1946   Reason for Encounter: CMA Follow up Call per CPP (Sleep and medication questions)  Called patient per CPP and followed up on patient's sleep and if she had any questions relating to her medications to discuss. Patient reported she is a "night owl" so she doesn't think that will change. She did not have any concerns and stated she did not feel she needed to follow up or speak to CPP at this time. Has a physical coming up with Dr Birdie Riddle soon. Patient  will call if any issues or concerns develop  Jobe Gibbon, [place] Pharmacist Assistant  [phone]

## 2021-06-09 ENCOUNTER — Other Ambulatory Visit (HOSPITAL_COMMUNITY): Payer: Self-pay | Admitting: Internal Medicine

## 2021-06-09 ENCOUNTER — Other Ambulatory Visit (HOSPITAL_COMMUNITY): Payer: Self-pay

## 2021-06-09 ENCOUNTER — Other Ambulatory Visit (HOSPITAL_COMMUNITY): Payer: Self-pay | Admitting: *Deleted

## 2021-06-09 ENCOUNTER — Telehealth (HOSPITAL_COMMUNITY): Payer: Self-pay

## 2021-06-09 DIAGNOSIS — I5022 Chronic systolic (congestive) heart failure: Secondary | ICD-10-CM

## 2021-06-09 MED FILL — Ivabradine HCl Tab 5 MG (Base Equiv): ORAL | 30 days supply | Qty: 60 | Fill #0 | Status: AC

## 2021-06-09 NOTE — Telephone Encounter (Signed)
Patient called to set up follow up appointment and Echo.  Please place Echo order and contact patient to schedule.

## 2021-06-12 ENCOUNTER — Other Ambulatory Visit (HOSPITAL_COMMUNITY): Payer: Self-pay

## 2021-06-13 ENCOUNTER — Encounter: Payer: Self-pay | Admitting: Family Medicine

## 2021-06-13 ENCOUNTER — Other Ambulatory Visit (HOSPITAL_COMMUNITY): Payer: Self-pay

## 2021-06-13 ENCOUNTER — Ambulatory Visit (INDEPENDENT_AMBULATORY_CARE_PROVIDER_SITE_OTHER): Payer: PPO | Admitting: Family Medicine

## 2021-06-13 VITALS — BP 108/80 | HR 72 | Temp 98.0°F | Resp 16 | Wt 150.2 lb

## 2021-06-13 DIAGNOSIS — I5022 Chronic systolic (congestive) heart failure: Secondary | ICD-10-CM | POA: Diagnosis not present

## 2021-06-13 DIAGNOSIS — E663 Overweight: Secondary | ICD-10-CM

## 2021-06-13 DIAGNOSIS — E785 Hyperlipidemia, unspecified: Secondary | ICD-10-CM | POA: Diagnosis not present

## 2021-06-13 DIAGNOSIS — T753XXA Motion sickness, initial encounter: Secondary | ICD-10-CM | POA: Diagnosis not present

## 2021-06-13 LAB — HEPATIC FUNCTION PANEL
ALT: 16 U/L (ref 0–35)
AST: 19 U/L (ref 0–37)
Albumin: 4.5 g/dL (ref 3.5–5.2)
Alkaline Phosphatase: 141 U/L — ABNORMAL HIGH (ref 39–117)
Bilirubin, Direct: 0.1 mg/dL (ref 0.0–0.3)
Total Bilirubin: 0.4 mg/dL (ref 0.2–1.2)
Total Protein: 6.7 g/dL (ref 6.0–8.3)

## 2021-06-13 LAB — CBC WITH DIFFERENTIAL/PLATELET
Basophils Absolute: 0 10*3/uL (ref 0.0–0.1)
Basophils Relative: 0.8 % (ref 0.0–3.0)
Eosinophils Absolute: 0.2 10*3/uL (ref 0.0–0.7)
Eosinophils Relative: 3.2 % (ref 0.0–5.0)
HCT: 41.8 % (ref 36.0–46.0)
Hemoglobin: 14 g/dL (ref 12.0–15.0)
Lymphocytes Relative: 21.6 % (ref 12.0–46.0)
Lymphs Abs: 1.1 10*3/uL (ref 0.7–4.0)
MCHC: 33.4 g/dL (ref 30.0–36.0)
MCV: 92.3 fl (ref 78.0–100.0)
Monocytes Absolute: 0.4 10*3/uL (ref 0.1–1.0)
Monocytes Relative: 8.4 % (ref 3.0–12.0)
Neutro Abs: 3.4 10*3/uL (ref 1.4–7.7)
Neutrophils Relative %: 66 % (ref 43.0–77.0)
Platelets: 216 10*3/uL (ref 150.0–400.0)
RBC: 4.52 Mil/uL (ref 3.87–5.11)
RDW: 13.8 % (ref 11.5–15.5)
WBC: 5.2 10*3/uL (ref 4.0–10.5)

## 2021-06-13 LAB — LIPID PANEL
Cholesterol: 174 mg/dL (ref 0–200)
HDL: 76.2 mg/dL (ref >39.00)
LDL Cholesterol: 83 mg/dL (ref 0–99)
NonHDL: 97.99
Total CHOL/HDL Ratio: 2
Triglycerides: 76 mg/dL (ref 0.0–149.0)
VLDL: 15.2 mg/dL (ref 0.0–40.0)

## 2021-06-13 LAB — BASIC METABOLIC PANEL
BUN: 14 mg/dL (ref 6–23)
CO2: 28 mEq/L (ref 19–32)
Calcium: 9.5 mg/dL (ref 8.4–10.5)
Chloride: 105 mEq/L (ref 96–112)
Creatinine, Ser: 0.7 mg/dL (ref 0.40–1.20)
GFR: 86.07 mL/min (ref >60.00)
Glucose, Bld: 103 mg/dL — ABNORMAL HIGH (ref 70–99)
Potassium: 4.1 mEq/L (ref 3.5–5.1)
Sodium: 139 mEq/L (ref 135–145)

## 2021-06-13 MED ORDER — SCOPOLAMINE 1 MG/3DAYS TD PT72
1.0000 | MEDICATED_PATCH | TRANSDERMAL | 1 refills | Status: DC
  Filled 2021-06-13: qty 10, 30d supply, fill #0

## 2021-06-13 NOTE — Progress Notes (Signed)
   Subjective:    Patient ID: Patricia Collier, female    DOB: 26-Jun-1946, 75 y.o.   MRN: [id number]  HPI Hyperlipidemia- chronic problem, on Crestor 10mg  daily.  Denies abd pain, N/V.  CHF- chronic problem.  Following w/ Dr Haroldine Laws.  On Coreg 3.125mg  BID, Corlanor 5mg  BID, Losartan 25mg  daily, Mexiletine 150mg  BID, and Spironolactone 12.5mg  daily.  Updated ECHO was ordered last week.  No CP, SOB, edema.  Overweight- pt is down 10 lbs since last year!  Pt is walking regularly- 'at least 5 days/week for 3-5 miles' and now playing pickle ball  Motion sickness- pt has 3 upcoming cruises.  Would like prescription for scopolamine patches   Review of Systems For ROS see HPI   This visit occurred during the SARS-CoV-2 public health emergency.  Safety protocols were in place, including screening questions prior to the visit, additional usage of staff PPE, and extensive cleaning of exam room while observing appropriate contact time as indicated for disinfecting solutions.      Objective:   Physical Exam Vitals reviewed.  Constitutional:      General: She is not in acute distress.    Appearance: Normal appearance. She is well-developed. She is not ill-appearing.  HENT:     Head: Normocephalic and atraumatic.  Eyes:     Conjunctiva/sclera: Conjunctivae normal.     Pupils: Pupils are equal, round, and reactive to light.  Neck:     Thyroid: No thyromegaly.  Cardiovascular:     Rate and Rhythm: Normal rate and regular rhythm.     Pulses: Normal pulses.     Heart sounds: Murmur (III/VI SEM over RUSB) heard.  Pulmonary:     Effort: Pulmonary effort is normal. No respiratory distress.     Breath sounds: Normal breath sounds.  Abdominal:     General: There is no distension.     Palpations: Abdomen is soft.     Tenderness: There is no abdominal tenderness.  Musculoskeletal:     Cervical back: Normal range of motion and neck supple.     Right lower leg: No edema.     Left lower leg: No  edema.  Lymphadenopathy:     Cervical: No cervical adenopathy.  Skin:    General: Skin is warm and dry.  Neurological:     Mental Status: She is alert and oriented to person, place, and time.  Psychiatric:        Behavior: Behavior normal.          Assessment & Plan:

## 2021-06-13 NOTE — Assessment & Plan Note (Signed)
Chronic problem.  Thankfully is asymptomatic and is being optimally medically managed by Dr Haroldine Laws and his team.  She is exercising regularly, taking all of her medication, and is feeling great.  Will continue to follow along.

## 2021-06-13 NOTE — Assessment & Plan Note (Signed)
Down 10 lbs since last year!  Walking, playing pickle ball, staying very active.  Will continue to follow.

## 2021-06-13 NOTE — Assessment & Plan Note (Signed)
Chronic problem.  On Crestor 10mg daily w/o difficulty.  Check labs.  Adjust meds prn  

## 2021-06-13 NOTE — Patient Instructions (Signed)
Schedule your complete physical in 6 months We'll notify you of your lab results and make any changes if needed Keep up the good work!  You look great!! Use the Scopolamine as needed Call with any questions or concerns Stay Safe!  Stay Healthy! Happy Holidays!!!

## 2021-06-14 ENCOUNTER — Other Ambulatory Visit: Payer: Self-pay

## 2021-06-14 ENCOUNTER — Telehealth: Payer: Self-pay

## 2021-06-14 ENCOUNTER — Other Ambulatory Visit (INDEPENDENT_AMBULATORY_CARE_PROVIDER_SITE_OTHER): Payer: PPO

## 2021-06-14 DIAGNOSIS — R748 Abnormal levels of other serum enzymes: Secondary | ICD-10-CM

## 2021-06-14 LAB — GAMMA GT: GGT: 19 U/L (ref 7–51)

## 2021-06-14 LAB — TSH: TSH: 2.89 u[IU]/mL (ref 0.35–5.50)

## 2021-06-14 NOTE — Telephone Encounter (Signed)
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  Great news!!

## 2021-06-14 NOTE — Progress Notes (Signed)
Patient is aware of labs, GGT added and faxed to lab

## 2021-06-14 NOTE — Telephone Encounter (Signed)
Patient aware of recent lab results

## 2021-06-15 ENCOUNTER — Other Ambulatory Visit (HOSPITAL_COMMUNITY): Payer: Self-pay

## 2021-06-22 ENCOUNTER — Ambulatory Visit: Payer: PPO

## 2021-07-04 ENCOUNTER — Other Ambulatory Visit (HOSPITAL_COMMUNITY): Payer: Self-pay

## 2021-07-04 MED FILL — Mexiletine HCl Cap 150 MG: ORAL | 30 days supply | Qty: 60 | Fill #3 | Status: AC

## 2021-07-05 ENCOUNTER — Other Ambulatory Visit (HOSPITAL_COMMUNITY): Payer: Self-pay

## 2021-07-10 ENCOUNTER — Other Ambulatory Visit (HOSPITAL_COMMUNITY): Payer: Self-pay

## 2021-07-10 MED FILL — Ivabradine HCl Tab 5 MG (Base Equiv): ORAL | 30 days supply | Qty: 60 | Fill #1 | Status: AC

## 2021-08-08 MED FILL — Mexiletine HCl Cap 150 MG: ORAL | 30 days supply | Qty: 60 | Fill #4 | Status: AC

## 2021-08-09 ENCOUNTER — Other Ambulatory Visit (HOSPITAL_COMMUNITY): Payer: Self-pay

## 2021-08-10 DIAGNOSIS — M25561 Pain in right knee: Secondary | ICD-10-CM | POA: Diagnosis not present

## 2021-08-10 DIAGNOSIS — M25461 Effusion, right knee: Secondary | ICD-10-CM | POA: Diagnosis not present

## 2021-08-14 DIAGNOSIS — L570 Actinic keratosis: Secondary | ICD-10-CM | POA: Diagnosis not present

## 2021-08-14 DIAGNOSIS — L814 Other melanin hyperpigmentation: Secondary | ICD-10-CM | POA: Diagnosis not present

## 2021-08-14 DIAGNOSIS — L821 Other seborrheic keratosis: Secondary | ICD-10-CM | POA: Diagnosis not present

## 2021-08-14 DIAGNOSIS — L853 Xerosis cutis: Secondary | ICD-10-CM | POA: Diagnosis not present

## 2021-08-16 DIAGNOSIS — M25561 Pain in right knee: Secondary | ICD-10-CM | POA: Diagnosis not present

## 2021-08-20 NOTE — Progress Notes (Signed)
Advanced Heart Failure Clinic Note   Primary Cardiologist: Dr. 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 T1No Grade III invasive ductal carcinoma and received dose-dense doxorubicin in 2009 and cyclophosphamide x4, paclitaxel x12  Admitted 7/18 with new onset acute systolic CHF. EF 15-20%. Right and left heart cath done and showed  moderate non-obstructive CAD Fick output/index 3.1/1.8.  Holter monitor 8/18: Multiform PVCs, couplets, and triplets were noted with periods of bigeminy. PVC burden < 1%.No VT observed.  Echo 9/19: EF 45-50% moderate AS mean gradient 9mmHG Echo 8/21: EF 50-55% mild to moderate AS mean 21mmHG  Zio 11/21: SR 72. 1.3% PVCs.   She returns for routine f/u. Says it has been a rough month. Her husband was hospitalized with Covid. She tore her R knee meniscus so not walking as much. SOB with steps. No edema. Occasioanl CP.   Echo today 08/21/21: EF 45-50% (EF 40-45%) with LBBB dyssynchrony. Mild-mod AS mean gradient 13 AVA 1.01 cm2 Personally reviewed  Cardiac studies:   Zio 11/21 1. Sinus rhythm - avg HR of 72 2. Isolated PACs were rare 3. Occasional PVCs  (1.3%, 17991) 4. No high-grade arrhythmias 5. Patient triggered events associated with isolated PVCs.    CPX 01/2018 Peak VO2: 21.5 (108% predicted peak VO2) VE/VCO2 slope:  48 OUES: 1.84 Peak RER: 0.93  Echo 05/09/17 EF ~30% RV ok. Personally reviewed  Echo 02/05/17 LVEF 15-20%, Grade 1 DD, Mild/Mod central MR, Mild LAE  R/LHC 02/06/17 There is severe left ventricular systolic dysfunction. Dist Cx lesion, 20 %stenosed. 1st Mrg lesion, 60 %stenosed. Dist RCA lesion, 20 %stenosed. Mid LAD to Dist LAD lesion, 20 %stenosed.   Cardiac MRI 06/2017 1.  Moderate LVE with diffuse hypokinesis EF 35% 2. Small area of apical  hyperenhancement on delayed gadolinium images 3.  Mild MR 4.  Mild LAE 5. Thickened tri leaflet AV with restricted leaflet motion suggest echo for AS correlation 6.  Normal RV size and function 7.  Normal aortic root 3.0 cm  Review of systems complete and found to be negative unless listed in HPI.   Past Medical History:  Diagnosis Date   Arthritis    ostearthritis -joints, greater left knee.   Breast cancer [facility])    Breast cancer, left breast ([place]) 2009   S/P surgery, radiation, chemotherapy.-no further oncology visits.   CHF (congestive heart failure) ([place]) dx'd 02/05/2017   Hyperlipidemia    Neuromuscular disorder (H[facility])    neuropathy due to chemotherapy   Personal history of chemotherapy 2009   Personal history of radiation therapy 2009   Seizures ([place])    "childbirth related"   SVT (supraventricular tachycardia) ([place])    x1 episode -3 yrs ago- no issues since.All heart testing negative. No further cardiac visits needed.   Current Outpatient Medications  Medication Sig Dispense Refill   Biotin 5 MG CAPS Take by mouth daily.     carvedilol (COREG) 3.125 MG tablet TAKE 1 TABLET BY MOUTH 2 TIMES DAILY. 180 tablet 3   Cholecalciferol (VITAMIN D-3) 1000 units CAPS Take 2 capsules by mouth daily.      ivabradine (CORLANOR) 5 MG TABS tablet TAKE 1 TABLET BY MOUTH 2 TIMES DAILY WITH A MEAL. 180 tablet 3   losartan (COZAAR) 25 MG tablet TAKE 1 TABLET BY MOUTH TWO TIMES  DAILY 60 tablet 11   Magnesium 250 MG TABS Take 1 tablet by mouth daily.      mexiletine (MEXITIL) 150 MG capsule TAKE 1 CAPSULE BY MOUTH 2 TIMES DAILY. 60 capsule 6   rosuvastatin (CRESTOR) 10 MG tablet Take 1 tablet (10 mg total) by mouth at bedtime. 90 tablet 1   scopolamine (TRANSDERM-SCOP) 1 MG/3DAYS Place 1 patch onto the skin as needed.     spironolactone (ALDACTONE) 25 MG tablet TAKE 1/2 TABLET BY MOUTH DAILY 45 tablet 3   No current facility-administered medications for this encounter.   Allergies  Allergen  Reactions   Effexor [Venlafaxine] Other (See Comments)    Caused depression, negative thoughts, and crying    Hydrocodone-Acetaminophen Itching and Nausea And Vomiting    Patient can tolerate with Zofran    Social History   Socioeconomic History   Marital status: Married    Spouse name: Not on file   Number of children: 2   Years of education: Not on file   Highest education level: Not on file  Occupational History   Occupation: anesthetist    Employer: [employer]: retired June 2019  Tobacco Use   Smoking status: Former    Packs/day: 2.00    Years: 10.00    Pack years: 20.00    Types: Cigarettes    Quit date: 07/09/1989    Years since quitting: 32.1   Smokeless tobacco: Never  Vaping Use   Vaping Use: Never used  Substance and Sexual Activity   Alcohol use: No   Drug use: No   Sexual activity: Yes  Other Topics Concern   Not on file  Social History Narrative   Patient with caregiver stress due to spouses declining health   Some conflicts with children and addictions (coping well)    Social Determinants of Health   Financial Resource Strain: Not on file  Food Insecurity: Not on file  Transportation Needs: Not on file  Physical Activity: Not on file  Stress: Not on file  Social Connections: Not on file  Intimate Partner Violence: Not on file   Family History  Problem Relation Age of Onset   Heart disease Mother    Hypertension Father    Breast cancer Sister    Lung cancer Sister    Skin cancer Brother    Vitals:   08/21/21 1151  BP: 124/78  Pulse: 64  SpO2: 100%  Weight: 69 kg (152 lb 3.2 oz)    Wt Readings from Last 3 Encounters:  08/21/21 69 kg (152 lb 3.2 oz)  06/13/21 68.1 kg (150 lb 3.2 oz)  11/30/20 67.9 kg (149 lb 12.8 oz)    PHYSICAL EXAM: General:  Well appearing. No resp difficulty HEENT: normal Neck: supple. no JVD. Carotids 2+ bilat; no bruits. No lymphadenopathy or thryomegaly appreciated. Cor: PMI nondisplaced. Regular  rate & rhythm. 2/6 AS Lungs: clear Abdomen: soft, nontender, nondistended. No hepatosplenomegaly. No bruits or masses. Good bowel sounds. Extremities: no cyanosis, clubbing, rash, edema Neuro: alert & orientedx3, cranial nerves grossly intact. moves all 4 extremities w/o difficulty. Affect pleasant   ASSESSMENT & PLAN:  1. Chronic systolic CHF: NICM, EF 74-08%, felt to be related to doxorubicin therapy. But also with frequent PVC's, holter monitor showed 1% PVC's but patient complained of very frequent PVC's.  - Cath 8/18 mild non-obs CAD. LAD 20%, OM-1 60% RCA 20% - Echo 05/2017 ~30-35%. LV size - Echo 03/09/18 EF 45-50% - CPX 01/2018 Peak VO2:  21.5 (108% predicted peak VO2) VE/VCO2 slope:  48 Peak RER: 0.93 - Echo 10/20 EF 40-45% mild to moderate AS - Echo 8/21 EF 50-55% - Echo today 08/21/21: EF 45-50% (I thought EF 40-45%) with LBBB dyssynchrony. Mild-mod AS mean gradient 13 AVA 1.01 cm2 Personally reviewed - Slightly worse today NYHA II. - Volume status ok. Not using lasix - Continue Spiro 12.5 mg daily. Did not tolerate higher dose. - Continue losartan 25 mg bid - Increase carvedilol to 6.25 mg BID - Stop ivabradine - Start Jardiance 10 when she comes back from her cruise - Slightly worse today. Will see back in 2 months. If not improved consider repeat CPX with eye toward possible CRT.  - check labs  2. Palpitations/Frequent PVCs - Holter monitor 03/2017 with PVC burden <1%. - Zio 9/21 PVCs 1.3% (monomorphic) - PVCs suppressed with mexilitene 150 bid. Ofd amio.  Watch for recurrence.   3. Mitral Regurgitation - Mild on cMRI 07/07/2017 and echo  4. Pulmonary nodule - Stable on f/u CT 11/18. No change.   5. Aortic stenosis - echo 10/20 AoV mean 15 AVA 0.99 (mild to moderate AS) - echo 8/21 Mild to moderate aortic valve stenosis. AVA 1.48 cm. Aortic valve mean gradient measures 18 mmHg. - remains mild to moderate on echo today. Continue to follow   6. Hyperlipidemia - now  on Crestor - recheck labs today   Glori Bickers, MD  12:43 PM

## 2021-08-21 ENCOUNTER — Ambulatory Visit (HOSPITAL_COMMUNITY)
Admission: RE | Admit: 2021-08-21 | Discharge: 2021-08-21 | Disposition: A | Discharge status: Home / Self Care | Payer: PPO | Source: Ambulatory Visit | Attending: Internal Medicine | Admitting: Internal Medicine

## 2021-08-21 ENCOUNTER — Ambulatory Visit (HOSPITAL_BASED_OUTPATIENT_CLINIC_OR_DEPARTMENT_OTHER)
Admission: RE | Admit: 2021-08-21 | Discharge: 2021-08-21 | Disposition: A | Discharge status: Home / Self Care | Payer: PPO | Source: Ambulatory Visit | Attending: Internal Medicine | Admitting: Internal Medicine

## 2021-08-21 ENCOUNTER — Other Ambulatory Visit (HOSPITAL_COMMUNITY): Payer: Self-pay

## 2021-08-21 ENCOUNTER — Encounter (HOSPITAL_COMMUNITY): Payer: Self-pay | Admitting: Internal Medicine

## 2021-08-21 ENCOUNTER — Other Ambulatory Visit: Payer: Self-pay

## 2021-08-21 VITALS — BP 124/78 | HR 64 | Wt 152.2 lb

## 2021-08-21 DIAGNOSIS — I5022 Chronic systolic (congestive) heart failure: Secondary | ICD-10-CM

## 2021-08-21 DIAGNOSIS — R911 Solitary pulmonary nodule: Secondary | ICD-10-CM | POA: Insufficient documentation

## 2021-08-21 DIAGNOSIS — Z79899 Other long term (current) drug therapy: Secondary | ICD-10-CM | POA: Diagnosis not present

## 2021-08-21 DIAGNOSIS — E785 Hyperlipidemia, unspecified: Secondary | ICD-10-CM | POA: Insufficient documentation

## 2021-08-21 DIAGNOSIS — I350 Nonrheumatic aortic (valve) stenosis: Secondary | ICD-10-CM | POA: Diagnosis not present

## 2021-08-21 DIAGNOSIS — I447 Left bundle-branch block, unspecified: Secondary | ICD-10-CM | POA: Insufficient documentation

## 2021-08-21 DIAGNOSIS — I5020 Unspecified systolic (congestive) heart failure: Secondary | ICD-10-CM | POA: Diagnosis not present

## 2021-08-21 DIAGNOSIS — I493 Ventricular premature depolarization: Secondary | ICD-10-CM | POA: Diagnosis not present

## 2021-08-21 DIAGNOSIS — I2510 Atherosclerotic heart disease of native coronary artery without angina pectoris: Secondary | ICD-10-CM | POA: Diagnosis not present

## 2021-08-21 DIAGNOSIS — Z853 Personal history of malignant neoplasm of breast: Secondary | ICD-10-CM | POA: Insufficient documentation

## 2021-08-21 DIAGNOSIS — I5023 Acute on chronic systolic (congestive) heart failure: Secondary | ICD-10-CM | POA: Insufficient documentation

## 2021-08-21 DIAGNOSIS — I080 Rheumatic disorders of both mitral and aortic valves: Secondary | ICD-10-CM | POA: Insufficient documentation

## 2021-08-21 LAB — CBC
HCT: 40.4 % (ref 36.0–46.0)
Hemoglobin: 13.3 g/dL (ref 12.0–15.0)
MCH: 30.2 pg (ref 26.0–34.0)
MCHC: 32.9 g/dL (ref 30.0–36.0)
MCV: 91.6 fL (ref 80.0–100.0)
Platelets: 245 10*3/uL (ref 150–400)
RBC: 4.41 MIL/uL (ref 3.87–5.11)
RDW: 12.6 % (ref 11.5–15.5)
WBC: 6.3 10*3/uL (ref 4.0–10.5)
nRBC: 0 % (ref 0.0–0.2)

## 2021-08-21 LAB — BASIC METABOLIC PANEL
Anion gap: 7 (ref 5–15)
BUN: 17 mg/dL (ref 8–23)
CO2: 25 mmol/L (ref 22–32)
Calcium: 9.2 mg/dL (ref 8.9–10.3)
Chloride: 107 mmol/L (ref 98–111)
Creatinine, Ser: 0.73 mg/dL (ref 0.44–1.00)
GFR, Estimated: >60 mL/min (ref >60)
Glucose, Bld: 101 mg/dL — ABNORMAL HIGH (ref 70–99)
Potassium: 4.1 mmol/L (ref 3.5–5.1)
Sodium: 139 mmol/L (ref 135–145)

## 2021-08-21 LAB — ECHOCARDIOGRAM COMPLETE
AR max vel: 0.97 cm2
AV Area VTI: 1.01 cm2
AV Area mean vel: 0.94 cm2
AV Mean grad: 12.7 mmHg
AV Peak grad: 21.5 mmHg
Ao pk vel: 2.32 m/s
Area-P 1/2: 3.17 cm2
Calc EF: 46.8 %
P 1/2 time: 841 msec
S' Lateral: 3.9 cm
Single Plane A2C EF: 45.6 %
Single Plane A4C EF: 46.5 %

## 2021-08-21 LAB — TSH: TSH: 1.593 u[IU]/mL (ref 0.350–4.500)

## 2021-08-21 LAB — BRAIN NATRIURETIC PEPTIDE: B Natriuretic Peptide: 139.9 pg/mL — ABNORMAL HIGH (ref 0.0–100.0)

## 2021-08-21 MED ORDER — EMPAGLIFLOZIN 10 MG PO TABS
10.0000 mg | ORAL_TABLET | Freq: Every day | ORAL | 11 refills | Status: DC
  Filled 2021-08-21: qty 30, 30d supply, fill #0
  Filled 2021-10-06: qty 30, 30d supply, fill #1

## 2021-08-21 MED ORDER — CARVEDILOL 6.25 MG PO TABS
6.2500 mg | ORAL_TABLET | Freq: Two times a day (BID) | ORAL | 6 refills | Status: DC
  Filled 2021-08-21: qty 60, 30d supply, fill #0

## 2021-08-21 NOTE — Progress Notes (Signed)
°  Echocardiogram 2D Echocardiogram has been performed.  Patricia Collier 08/21/2021, 11:56 AM

## 2021-08-21 NOTE — Patient Instructions (Signed)
Medication Changes:  Stop Ivabradine Increase Carvedilol to 6.25mg  Twice daily  Start   Lab Work:  Labs done today, your results will be available in MyChart, we will contact you for abnormal readings.   Testing/Procedures:  none  Referrals:  none  Special Instructions // Education:  DON'T START JARDIANCE UNTIL YOU ARE BACK FROM YOUR CRUISE  Follow-Up in: 3 months  At the [facility], you and your health needs are our priority. We have a designated team specialized in the treatment of Heart Failure. This Care Team includes your primary Heart Failure Specialized Cardiologist (physician), Advanced Practice Providers (APPs- Physician Assistants and Nurse Practitioners), and Pharmacist who all work together to provide you with the care you need, when you need it.   You may see any of the following providers on your designated Care Team at your next follow up:  Dr Glori Bickers Dr Haynes Kerns, NP Lyda Jester, [place] [facility] [place], [place] Audry Riles, PharmD   Please be sure to bring in all your medications bottles to every appointment.   Need to Contact us:  If you have any questions or concerns before your next appointment please send [place] a message through [place] or call our office at [phone].    TO LEAVE A MESSAGE FOR THE NURSE SELECT OPTION 2, PLEASE LEAVE A MESSAGE INCLUDING: YOUR NAME DATE OF BIRTH CALL BACK NUMBER REASON FOR CALL**this is important as we prioritize the call backs  YOU WILL RECEIVE A CALL BACK THE SAME DAY AS LONG AS YOU CALL BEFORE 4:00 PM

## 2021-08-24 DIAGNOSIS — S83281A Other tear of lateral meniscus, current injury, right knee, initial encounter: Secondary | ICD-10-CM | POA: Diagnosis not present

## 2021-08-25 ENCOUNTER — Telehealth: Payer: Self-pay | Admitting: *Deleted

## 2021-08-25 NOTE — Telephone Encounter (Signed)
° °  Pre-operative Risk Assessment    Patient Name: Patricia Collier  DOB: Dec 10, 1945 MRN: [id number]     PT IS FOLLOWED BY DR. Haroldine Laws WITH OUR HEART FAILURE CLINIC. I WILL FAX THE CLEARANCE TO MD AND HIS NURSE HEATHER S. RN Request for Surgical Clearance    Procedure:   RIGHT KNEE ARTHROSCOPY  Date of Surgery:  Clearance 09/12/21                                 Surgeon:  DR. Gaynelle Arabian Surgeon's Group or Practice Name:  [facility] Phone number:  [phone] Fax number:  [phone] ATTN: [place] Chard   Type of Clearance Requested:   - Medical    Type of Anesthesia:   CHOICE   Additional requests/questions:    Jiles Prows   08/25/2021, 4:55 PM

## 2021-08-28 NOTE — Telephone Encounter (Signed)
Notes were sent to me. I will fax clearance note from Dr. Haroldine Laws:  Bensimhon, Shaune Pascal, MD  Scarlette Calico, RN; [facility] Triage Pool 3 days ago   Ok to proceed     If you have any further questions please reach out to Dr. Haroldine Laws with heart failure clinic.

## 2021-09-12 ENCOUNTER — Other Ambulatory Visit (HOSPITAL_COMMUNITY): Payer: Self-pay

## 2021-09-12 MED FILL — Mexiletine HCl Cap 150 MG: ORAL | 30 days supply | Qty: 60 | Fill #5 | Status: AC

## 2021-09-13 ENCOUNTER — Other Ambulatory Visit (HOSPITAL_COMMUNITY): Payer: Self-pay

## 2021-09-20 ENCOUNTER — Encounter (HOSPITAL_COMMUNITY): Payer: Self-pay | Admitting: Internal Medicine

## 2021-09-20 ENCOUNTER — Other Ambulatory Visit (HOSPITAL_COMMUNITY): Payer: Self-pay

## 2021-09-20 ENCOUNTER — Other Ambulatory Visit (HOSPITAL_COMMUNITY): Payer: Self-pay | Admitting: *Deleted

## 2021-09-20 MED ORDER — CARVEDILOL 3.125 MG PO TABS
3.1250 mg | ORAL_TABLET | Freq: Two times a day (BID) | ORAL | 3 refills | Status: DC
  Filled 2021-09-20: qty 60, 30d supply, fill #0
  Filled 2021-11-17: qty 60, 30d supply, fill #1
  Filled 2021-12-24: qty 60, 30d supply, fill #2
  Filled 2022-01-23: qty 60, 30d supply, fill #3

## 2021-09-20 MED ORDER — IVABRADINE HCL 5 MG PO TABS
5.0000 mg | ORAL_TABLET | Freq: Two times a day (BID) | ORAL | 3 refills | Status: DC
  Filled 2021-09-20: qty 60, 30d supply, fill #0
  Filled 2021-10-19: qty 60, 30d supply, fill #1
  Filled 2021-11-17: qty 60, 30d supply, fill #2
  Filled 2021-12-24: qty 60, 30d supply, fill #3

## 2021-09-28 NOTE — Patient Instructions (Addendum)
TWO  VISITORS  (aged 74 and older)  IS ALLOWED TO COME WITH YOU AND STAY IN THE WAITING ROOM ONLY DURING PRE OP AND PROCEDURE.   ?**NO VISITORS ARE ALLOWED IN THE SHORT STAY AREA OR RECOVERY ROOM!!** ? ?IF YOU WILL BE ADMITTED INTO THE HOSPITAL YOU ARE ALLOWED 4  SUPPORT PEOPLE DURING VISITATION HOURS ONLY (7 AM -8PM)   ?The support person(s) must pass our screening, gel in and out, and wear a mask at all times, including in the patient?s room. ?Patients must also wear a mask when staff or their support person are in the room. ?Visitors GUEST BADGE MUST BE WORN VISIBLY  ?One adult visitor may remain with you overnight and MUST be in the room by 8 P.M. ?  ? ? Your procedure is scheduled on: 10/09/21 ? ? Report to [facility] Main Entrance ? ?  Report to admitting at  12:35 PM ? ? Call this number if you have problems the morning of surgery [phone] ? ? Do not eat food :After Midnight. ? ? After Midnight you may have the following liquids until __11:30____ AM/ PM DAY OF SURGERY ? ?Water ?Black Coffee (sugar ok, NO MILK/CREAM OR CREAMERS)  ?Tea (sugar ok, NO MILK/CREAM OR CREAMERS) regular and decaf                             ?Plain Jell-O (NO RED)                                           ?Fruit ices (not with fruit pulp, NO RED)                                     ?Popsicles (NO RED)                                                                  ?Juice: apple, WHITE grape, WHITE cranberry ?Sports drinks like Gatorade (NO RED) ?Clear broth(vegetable,chicken,beef) ? ?             ? ?  ?       If you have questions, please contact your surgeon?s office. ? ? ?  ?  ?Oral Hygiene is also important to reduce your risk of infection.                                    ?Remember - BRUSH YOUR TEETH THE MORNING OF SURGERY WITH YOUR REGULAR TOOTHPASTE ? ? Do NOT smoke after Midnight ? ? Take these medicines the morning of surgery with A SIP OF WATER: Mexiletine, Carvedilol, Ivabradine ? ?DO NOT TAKE ANY ORAL  DIABETIC MEDICATIONS DAY OF YOUR SURGERY- Jardiance ? ?Bring CPAP mask and tubing day of surgery. ?                  ?           You may not have any metal on  your body including hair pins, jewelry, and body piercing ? ?           Do not wear make-up, lotions, powders, perfumes/cologne, or deodorant ? ?Do not wear nail polish including gel and S&S, artificial/acrylic nails, or any other type of covering on natural nails including finger and toenails. If you have artificial nails, gel coating, etc. that needs to be removed by a nail salon please have this removed prior to surgery or surgery may need to be canceled/ delayed if the surgeon/ anesthesia feels like they are unable to be safely monitored.  ? ?Do not shave  48 hours prior to surgery.  ? ? ? Do not bring valuables to the hospital. [facility] NOT ?            RESPONSIBLE   FOR VALUABLES. ? ? Contacts, dentures or bridgework may not be worn into surgery. ? ?  ? Patients discharged on the day of surgery will not be allowed to drive home.  Someone NEEDS to stay with you for the first 24 hours after anesthesia. ? ? Special Instructions: Bring a copy of your healthcare power of attorney and living will documents the day of surgery if you haven't scanned them before. ? ?            Please read over the following fact sheets you were given: IF [place] [phone] ? ?   [facility] - Preparing for Surgery ?Before surgery, you can play an important role.  Because skin is not sterile, your skin needs to be as free of germs as possible.  You can reduce the number of germs on your skin by washing with CHG (chlorahexidine gluconate) soap before surgery.  CHG is an antiseptic cleaner which kills germs and bonds with the skin to continue killing germs even after washing. ?Please DO NOT use if you have an allergy to CHG or antibacterial soaps.  If your skin becomes reddened/irritated stop using the CHG and inform  your nurse when you arrive at Short Stay. ?Do not shave (including legs and underarms) for at least 48 hours prior to the first CHG shower.   ?Please follow these instructions carefully: ? 1.  Shower with CHG Soap the night before surgery and the  morning of Surgery. ? 2.  If you choose to wash your hair, wash your hair first as usual with your  normal  shampoo. ? 3.  After you shampoo, rinse your hair and body thoroughly to remove the  shampoo.                            ?            4.  Use CHG as you would any other liquid soap.  You can apply chg directly  to the skin and wash  ?                     Gently with a scrungie or clean washcloth. ? 5.  Apply the CHG Soap to your body ONLY FROM THE NECK DOWN.   Do not use on face/ open      ?                     Wound or open sores. Avoid contact with eyes, ears mouth and genitals (private parts).  ?  Wash face,  Genitals (private parts) with your normal soap. ?            6.  Wash thoroughly, paying special attention to the area where your surgery  will be performed. ? 7.  Thoroughly rinse your body with warm water from the neck down. ? 8.  DO NOT shower/wash with your normal soap after using and rinsing off  the CHG Soap. ?               9.  Pat yourself dry with a clean towel. ?           10.  Wear clean pajamas. ?           11.  Place clean sheets on your bed the night of your first shower and do not  sleep with pets. ?Day of Surgery : ?Do not apply any lotions/deodorants the morning of surgery.  Please wear clean clothes to the hospital/surgery center. ? ?FAILURE TO FOLLOW THESE INSTRUCTIONS MAY RESULT IN THE CANCELLATION OF YOUR SURGERY ? ? ?________________________________________________________________________  ? ?Incentive Spirometer ? ?An incentive spirometer is a tool that can help keep your lungs clear and active. This tool measures how well you are filling your lungs with each breath. Taking long deep breaths may help reverse or  decrease the chance of developing breathing (pulmonary) problems (especially infection) following: ?A long period of time when you are unable to move or be active. ?BEFORE THE PROCEDURE  ?If the spirometer includes an indicator to show your best effort, your nurse or respiratory therapist will set it to a desired goal. ?If possible, sit up straight or lean slightly forward. Try not to slouch. ?Hold the incentive spirometer in an upright position. ?INSTRUCTIONS FOR USE  ?Sit on the edge of your bed if possible, or sit up as far as you can in bed or on a chair. ?Hold the incentive spirometer in an upright position. ?Breathe out normally. ?Place the mouthpiece in your mouth and seal your lips tightly around it. ?Breathe in slowly and as deeply as possible, raising the piston or the ball toward the top of the column. ?Hold your breath for 3-5 seconds or for as long as possible. Allow the piston or ball to fall to the bottom of the column. ?Remove the mouthpiece from your mouth and breathe out normally. ?Rest for a few seconds and repeat Steps 1 through 7 at least 10 times every 1-2 hours when you are awake. Take your time and take a few normal breaths between deep breaths. ?The spirometer may include an indicator to show your best effort. Use the indicator as a goal to work toward during each repetition. ?After each set of 10 deep breaths, practice coughing to be sure your lungs are clear. If you have an incision (the cut made at the time of surgery), support your incision when coughing by placing a pillow or rolled up towels firmly against it. ?Once you are able to get out of bed, walk around indoors and cough well. You may stop using the incentive spirometer when instructed by your caregiver.  ?RISKS AND COMPLICATIONS ?Take your time so you do not get dizzy or light-headed. ?If you are in pain, you may need to take or ask for pain medication before doing incentive spirometry. It is harder to take a deep breath if you  are having pain. ?AFTER USE ?Rest and breathe slowly and easily. ?It can be helpful to keep track of a log of  your progress. Your caregiver can provide you with a simple table to help with this. ?If you are using the

## 2021-09-29 ENCOUNTER — Encounter (HOSPITAL_COMMUNITY)
Admission: RE | Admit: 2021-09-29 | Discharge: 2021-09-29 | Disposition: A | Discharge status: Home / Self Care | Payer: PPO | Source: Ambulatory Visit | Attending: Orthopedic Surgery | Admitting: Orthopedic Surgery

## 2021-09-29 ENCOUNTER — Other Ambulatory Visit: Payer: Self-pay

## 2021-09-29 ENCOUNTER — Encounter (HOSPITAL_COMMUNITY): Payer: Self-pay

## 2021-09-29 VITALS — BP 121/71 | HR 72 | Temp 98.3°F | Resp 16 | Ht 63.5 in | Wt 145.0 lb

## 2021-09-29 DIAGNOSIS — I447 Left bundle-branch block, unspecified: Secondary | ICD-10-CM | POA: Insufficient documentation

## 2021-09-29 DIAGNOSIS — Z01818 Encounter for other preprocedural examination: Secondary | ICD-10-CM

## 2021-09-29 DIAGNOSIS — G4730 Sleep apnea, unspecified: Secondary | ICD-10-CM | POA: Diagnosis not present

## 2021-09-29 DIAGNOSIS — Z87891 Personal history of nicotine dependence: Secondary | ICD-10-CM | POA: Diagnosis not present

## 2021-09-29 DIAGNOSIS — Z853 Personal history of malignant neoplasm of breast: Secondary | ICD-10-CM | POA: Diagnosis not present

## 2021-09-29 DIAGNOSIS — Z01812 Encounter for preprocedural laboratory examination: Secondary | ICD-10-CM | POA: Insufficient documentation

## 2021-09-29 DIAGNOSIS — I110 Hypertensive heart disease with heart failure: Secondary | ICD-10-CM | POA: Insufficient documentation

## 2021-09-29 DIAGNOSIS — S83281A Other tear of lateral meniscus, current injury, right knee, initial encounter: Secondary | ICD-10-CM | POA: Insufficient documentation

## 2021-09-29 HISTORY — DX: Sleep apnea, unspecified: G47.30

## 2021-09-29 HISTORY — DX: Essential (primary) hypertension: I10

## 2021-09-29 LAB — SURGICAL PCR SCREEN
MRSA, PCR: NEGATIVE
Staphylococcus aureus: POSITIVE — AB

## 2021-09-29 LAB — BASIC METABOLIC PANEL
Anion gap: 8 (ref 5–15)
BUN: 14 mg/dL (ref 8–23)
CO2: 21 mmol/L — ABNORMAL LOW (ref 22–32)
Calcium: 9.5 mg/dL (ref 8.9–10.3)
Chloride: 106 mmol/L (ref 98–111)
Creatinine, Ser: 0.72 mg/dL (ref 0.44–1.00)
GFR, Estimated: >60 mL/min (ref >60)
Glucose, Bld: 87 mg/dL (ref 70–99)
Potassium: 3.8 mmol/L (ref 3.5–5.1)
Sodium: 135 mmol/L (ref 135–145)

## 2021-09-29 LAB — CBC
HCT: 42.1 % (ref 36.0–46.0)
Hemoglobin: 14.1 g/dL (ref 12.0–15.0)
MCH: 30.3 pg (ref 26.0–34.0)
MCHC: 33.5 g/dL (ref 30.0–36.0)
MCV: 90.5 fL (ref 80.0–100.0)
Platelets: 203 10*3/uL (ref 150–400)
RBC: 4.65 MIL/uL (ref 3.87–5.11)
RDW: 13 % (ref 11.5–15.5)
WBC: 5.7 10*3/uL (ref 4.0–10.5)
nRBC: 0 % (ref 0.0–0.2)

## 2021-09-29 NOTE — Progress Notes (Signed)
Anesthesia note: ? ?Bowel prep reminder:NA ? ?PCP - Dr. Raliegh Ip. Tabori ?Cardiologist -Dr. Keturah Barre. Bensimhon ?Other-  ? ?Chest x-ray -  ?EKG - 08/21/21-epic ?Stress Test - 2019-epic ?ECHO - 08/21/21-epic ?Cardiac Cath - 2015.2018 no stents ? ?Pacemaker/ICD device last checked:NA ? ?Sleep Study - yes ?CPAP - no ? ?Pt is pre diabetic-NA ?Fasting Blood Sugar -  ?Checks Blood Sugar _____ ? ?Blood Thinner:NA ?Blood Thinner Instructions: ?Aspirin Instructions: ?Last Dose: ? ?Anesthesia review: yes ? ?Patient denies shortness of breath, fever, cough and chest pain at PAT appointment ?Pt has chronic Heart failure as a result of chemo. She reports SOB with 2 flights of stairs but not with ADLs. ? ?Patient verbalized understanding of instructions that were given to them at the PAT appointment. Patient was also instructed that they will need to review over the PAT instructions again at home before surgery. yes ?

## 2021-10-04 NOTE — Anesthesia Preprocedure Evaluation (Addendum)
Anesthesia Evaluation  ?Patient identified by MRN, date of birth, ID band ?Patient awake ? ? ? ?Reviewed: ?Allergy & Precautions, NPO status , Patient's Chart, lab work & pertinent test results ? ?Airway ?Mallampati: I ? ?TM Distance: >3 FB ?Neck ROM: Full ? ? ? Dental ?no notable dental hx. ?(+) Teeth Intact, Dental Advisory Given ?  ?Pulmonary ?former smoker,  ?  ?Pulmonary exam normal ?breath sounds clear to auscultation ? ? ? ? ? ? Cardiovascular ?hypertension, Pt. on home beta blockers and Pt. on medications ?+CHF  ?Normal cardiovascular exam+ Valvular Problems/Murmurs (Mild to Mod ) AS  ?Rhythm:Regular Rate:Normal ? ?08/21/2021 TTE ??1. Global hypokinesis worse in the septum. Septal dyskinesis consistent  ?with bundle branch block. Compared with the echo 09/2990, sytolic function  ?and GLS have worsened. Left ventricular ejection fraction, by estimation,  ?is 45 to 50%. The left ventricle  ?has mildly decreased function. The left ventricle demonstrates global  ?hypokinesis. Left ventricular diastolic parameters are consistent with  ?Grade I diastolic dysfunction (impaired relaxation). Elevated left  ?ventricular end-diastolic pressure. The  ?average left ventricular global longitudinal strain is -16.2 %. The global  ?longitudinal strain is abnormal.  ??2. Right ventricular systolic function is normal. The right ventricular  ?size is normal. There is normal pulmonary artery systolic pressure.  ??3. The mitral valve is normal in structure. Trivial mitral valve  ?regurgitation. No evidence of mitral stenosis.  ??4. The aortic valve is tricuspid. There is mild calcification of the  ?aortic valve. There is mild thickening of the aortic valve. Aortic valve  ?regurgitation is mild. Mild aortic valve stenosis. Aortic regurgitation  ?PHT measures 841 msec. Aortic valve  ?area, by VTI measures 1.01 cm?Marland Kitchen Aortic valve mean gradient measures 12.7  ?mmHg. Aortic valve Vmax measures 2.32 m/s.   ??5. The inferior vena cava is normal in size with greater than 50%  ?respiratory variability, suggesting right atrial pressure of 3 mmHg.  ?  ?Neuro/Psych ?  ? GI/Hepatic ?negative GI ROS, Neg liver ROS,   ?Endo/Other  ? ? Renal/GU ?  ? ?  ?Musculoskeletal ? ?(+) Arthritis ,  ? Abdominal ?  ?Peds ? Hematology ?Lab Results ?     Component                Value               Date                 ?     HGB                      14.1                09/29/2021           ?     PLT                      203                 09/29/2021           ?   ?Anesthesia Other Findings ?All: effexor, hydrocodone / acetaminophen ? ?Breast CA S/P XRTx & chemo ? Reproductive/Obstetrics ? ?  ? ? ? ? ? ? ? ? ? ? ? ? ? ?  ?  ? ? ? ? ? ? ?Anesthesia Physical ?Anesthesia Plan ? ?ASA: 3 ? ?Anesthesia Plan: General  ? ?Post-op Pain Management: Ofirmev IV (intra-op)*  ? ?  Induction: Intravenous ? ?PONV Risk Score and Plan: 4 or greater and Treatment may vary due to age or medical condition, Ondansetron and Midazolam ? ?Airway Management Planned: LMA ? ?Additional Equipment: None ? ?Intra-op Plan:  ? ?Post-operative Plan:  ? ?Informed Consent: I have reviewed the patients History and Physical, chart, labs and discussed the procedure including the risks, benefits and alternatives for the proposed anesthesia with the patient or authorized representative who has indicated his/her understanding and acceptance.  ? ? ? ?Dental advisory given ? ?Plan Discussed with: CRNA and Anesthesiologist ? ?Anesthesia Plan Comments: (See PAT note 09/29/2021, Konrad Felix Ward, PA-C)  ? ? ? ? ?Anesthesia Quick Evaluation ? ?

## 2021-10-04 NOTE — Progress Notes (Signed)
Anesthesia Chart Review ? ? Case: [id number] Date/Time: 10/09/21 1435  ? Procedure: Right knee arthroscopy; meniscal debridement (Right: Knee)  ? Anesthesia type: Choice  ? Pre-op diagnosis: Right knee lateral meniscal tear  ? Location: [facility] ROOM 10 / [facility]  ? Surgeons: Gaynelle Arabian, MD  ? ?  ? ? ?DISCUSSION:76 y.o. 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 Gaynelle Arabian.  ? ?Pt last seen by cardiology 08/21/2021. Stable at this visit. Per Dr. Haroldine Laws note 08/28/2021, "Ok to proceed" ? ?Anticipate pt can proceed with planned procedure barring acute status change.   ?VS: BP 121/71   Pulse 72   Temp 36.8 ?C (Oral)   Resp 16   Ht 5' 3.5" (1.613 m)   Wt 65.8 kg   BMI 25.28 kg/m?  ? ?PROVIDERS: ?[name], MD is PCP  ? ?Glori Bickers, MD is Cardiologist  ?LABS: Labs reviewed: Acceptable for surgery. ?(all labs ordered are listed, but only abnormal results are displayed) ? ?Labs Reviewed  ?SURGICAL PCR SCREEN - Abnormal; Notable for the following components:  ?    Result Value  ? Staphylococcus aureus POSITIVE (*)   ? All other components within normal limits  ?BASIC METABOLIC PANEL - Abnormal; Notable for the following components:  ? CO2 21 (*)   ? All other components within normal limits  ?CBC  ? ? ? ?IMAGES: ? ? ?EKG: ?08/21/2021 ?Rate 67 bpm  ?NSR ?LAD ?LBBB ? ?CV: ?Echo 08/21/2021 ?1. Global hypokinesis worse in the septum. Septal dyskinesis consistent  ?with bundle branch block. Compared with the echo 03/9832, sytolic function  ?and GLS have worsened. Left ventricular ejection fraction, by estimation,  ?is 45 to 50%. The left ventricle  ?has mildly decreased function. The left ventricle demonstrates global  ?hypokinesis. Left ventricular diastolic parameters are consistent with  ?Grade I diastolic dysfunction (impaired  relaxation). Elevated left  ?ventricular end-diastolic pressure. The  ?average left ventricular global longitudinal strain is -16.2 %. The global  ?longitudinal strain is abnormal.  ? 2. Right ventricular systolic function is normal. The right ventricular  ?size is normal. There is normal pulmonary artery systolic pressure.  ? 3. The mitral valve is normal in structure. Trivial mitral valve  ?regurgitation. No evidence of mitral stenosis.  ? 4. The aortic valve is tricuspid. There is mild calcification of the  ?aortic valve. There is mild thickening of the aortic valve. Aortic valve  ?regurgitation is mild. Mild aortic valve stenosis. Aortic regurgitation  ?PHT measures 841 msec. Aortic valve  ?area, by VTI measures 1.01 cm?Marland Kitchen Aortic valve mean gradient measures 12.7  ?mmHg. Aortic valve Vmax measures 2.32 m/s.  ? 5. The inferior vena cava is normal in size with greater than 50%  ?respiratory variability, suggesting right atrial pressure of 3 mmHg. ?Past Medical History:  ?Diagnosis Date  ? Arthritis   ? ostearthritis -joints, greater left knee.  ? Breast cancer ([place])   ? Breast cancer, left breast ([place]) 2009  ? S/P surgery, radiation, chemotherapy.-no further oncology visits.  ? CHF (congestive heart failure) ([place]) dx'd 02/05/2017  ? Hyperlipidemia   ? Hypertension   ? Neuromuscular disorder ([place])   ? neuropathy due to chemotherapy  ? Personal history of chemotherapy 2009  ? Personal history of radiation therapy 2009  ? Seizures ([place])   ? "childbirth related"  ? Sleep  apnea   ? mild no C-PAP  ? SVT (supraventricular tachycardia) ([place])   ? x1 episode -3 yrs ago- no issues since.All heart testing negative. No further cardiac visits needed.  ? ? ?Past Surgical History:  ?Procedure Laterality Date  ? ABDOMINAL HYSTERECTOMY  1984  ? BILATERAL SALPINGOOPHORECTOMY Bilateral 2000s  ? BREAST BIOPSY Left 2009  ? BREAST LUMPECTOMY Left 2009  ? sentinel node dissection  ? BUNIONECTOMY Bilateral early 2000s  ? CARDIAC  CATHETERIZATION  2015  ? DIAGNOSTIC LAPAROSCOPY  1990s-early 2000s  ? multiple for adhesions  ? DILATION AND CURETTAGE OF UTERUS  2000  ? w/laparoscopies  ? HAMMER TOE SURGERY Right 09/2015; 06/22/2016; 12/28/2016  ? 2nd digit  ? KNEE ARTHROSCOPY Left 09/21/2015  ? Procedure: LEFT ARTHROSCOPY KNEE WITH LATERAL MENISCUS DEBRIDEMENT;  Surgeon: Gaynelle Arabian, MD;  [facility];  Service: Orthopedics;  Laterality: Left;  ? LAPAROSCOPIC CHOLECYSTECTOMY    ? RHINOPLASTY  1979  ? RIGHT/LEFT HEART CATH AND CORONARY ANGIOGRAPHY N/A 02/06/2017  ? Procedure: Right/Left Heart Cath and Coronary Angiography;  Surgeon: Jolaine Artist, MD;  Location: [facility];  Service: Cardiovascular;  Laterality: N/A;  ? [place]  ? TRANSANAL RECTOPEXY  1990s  ? ? ?MEDICATIONS: ? Biotin 5 MG CAPS  ? carvedilol (COREG) 3.125 MG tablet  ? Cholecalciferol (VITAMIN D-3) 1000 units CAPS  ? empagliflozin (JARDIANCE) 10 MG TABS tablet  ? ivabradine (CORLANOR) 5 MG TABS tablet  ? losartan (COZAAR) 25 MG tablet  ? Magnesium 250 MG TABS  ? mexiletine (MEXITIL) 150 MG capsule  ? rosuvastatin (CRESTOR) 10 MG tablet  ? scopolamine (TRANSDERM-SCOP) 1 MG/3DAYS  ? spironolactone (ALDACTONE) 25 MG tablet  ? zinc gluconate 50 MG tablet  ? ?No current facility-administered medications for this encounter.  ? ? ? ?Konrad Felix Ward, PA-C ?[facility] ?[phone] ? ? ? ? ? ?

## 2021-10-06 ENCOUNTER — Other Ambulatory Visit (HOSPITAL_COMMUNITY): Payer: Self-pay

## 2021-10-09 ENCOUNTER — Other Ambulatory Visit (HOSPITAL_COMMUNITY): Payer: Self-pay

## 2021-10-09 ENCOUNTER — Ambulatory Visit (HOSPITAL_BASED_OUTPATIENT_CLINIC_OR_DEPARTMENT_OTHER): Payer: PPO | Admitting: Anesthesiology

## 2021-10-09 ENCOUNTER — Encounter (HOSPITAL_COMMUNITY)
Admission: RE | Disposition: A | Discharge status: Home / Self Care | Payer: Self-pay | Source: Ambulatory Visit | Attending: Orthopedic Surgery

## 2021-10-09 ENCOUNTER — Ambulatory Visit (HOSPITAL_COMMUNITY): Payer: PPO | Admitting: Physician Assistant

## 2021-10-09 ENCOUNTER — Ambulatory Visit (HOSPITAL_COMMUNITY)
Admission: RE | Admit: 2021-10-09 | Discharge: 2021-10-09 | Disposition: A | Discharge status: Home / Self Care | Payer: PPO | Source: Ambulatory Visit | Attending: Orthopedic Surgery | Admitting: Orthopedic Surgery

## 2021-10-09 ENCOUNTER — Encounter (HOSPITAL_COMMUNITY): Payer: Self-pay | Admitting: Orthopedic Surgery

## 2021-10-09 DIAGNOSIS — I5032 Chronic diastolic (congestive) heart failure: Secondary | ICD-10-CM | POA: Diagnosis not present

## 2021-10-09 DIAGNOSIS — I509 Heart failure, unspecified: Secondary | ICD-10-CM | POA: Insufficient documentation

## 2021-10-09 DIAGNOSIS — Z853 Personal history of malignant neoplasm of breast: Secondary | ICD-10-CM | POA: Diagnosis not present

## 2021-10-09 DIAGNOSIS — Z87891 Personal history of nicotine dependence: Secondary | ICD-10-CM | POA: Diagnosis not present

## 2021-10-09 DIAGNOSIS — Z923 Personal history of irradiation: Secondary | ICD-10-CM | POA: Diagnosis not present

## 2021-10-09 DIAGNOSIS — I350 Nonrheumatic aortic (valve) stenosis: Secondary | ICD-10-CM

## 2021-10-09 DIAGNOSIS — M25561 Pain in right knee: Secondary | ICD-10-CM | POA: Diagnosis not present

## 2021-10-09 DIAGNOSIS — S83281A Other tear of lateral meniscus, current injury, right knee, initial encounter: Secondary | ICD-10-CM | POA: Diagnosis not present

## 2021-10-09 DIAGNOSIS — Z9221 Personal history of antineoplastic chemotherapy: Secondary | ICD-10-CM | POA: Insufficient documentation

## 2021-10-09 DIAGNOSIS — M2241 Chondromalacia patellae, right knee: Secondary | ICD-10-CM | POA: Insufficient documentation

## 2021-10-09 DIAGNOSIS — I110 Hypertensive heart disease with heart failure: Secondary | ICD-10-CM | POA: Diagnosis not present

## 2021-10-09 DIAGNOSIS — S83289A Other tear of lateral meniscus, current injury, unspecified knee, initial encounter: Secondary | ICD-10-CM | POA: Diagnosis present

## 2021-10-09 DIAGNOSIS — X58XXXA Exposure to other specified factors, initial encounter: Secondary | ICD-10-CM | POA: Insufficient documentation

## 2021-10-09 HISTORY — PX: KNEE ARTHROSCOPY: SHX127

## 2021-10-09 HISTORY — PX: KNEE SURGERY: SHX244

## 2021-10-09 SURGERY — ARTHROSCOPY, KNEE
Anesthesia: General | Site: Knee | Laterality: Right

## 2021-10-09 MED ORDER — POVIDONE-IODINE 10 % EX SWAB
2.0000 "application " | Freq: Once | CUTANEOUS | Status: AC
  Administered 2021-10-09: 2 via TOPICAL

## 2021-10-09 MED ORDER — OXYCODONE HCL 5 MG PO TABS
5.0000 mg | ORAL_TABLET | Freq: Four times a day (QID) | ORAL | 0 refills | Status: DC | PRN
Start: 2021-10-09 — End: 2021-11-06
  Filled 2021-10-09: qty 20, 5d supply, fill #0

## 2021-10-09 MED ORDER — LIDOCAINE 2% (20 MG/ML) 5 ML SYRINGE
INTRAMUSCULAR | Status: DC | PRN
  Administered 2021-10-09: 60 mg via INTRAVENOUS

## 2021-10-09 MED ORDER — METHOCARBAMOL 500 MG PO TABS
500.0000 mg | ORAL_TABLET | Freq: Four times a day (QID) | ORAL | 0 refills | Status: DC | PRN
  Filled 2021-10-09: qty 40, 10d supply, fill #0

## 2021-10-09 MED ORDER — LIDOCAINE HCL (PF) 2 % IJ SOLN
INTRAMUSCULAR | Status: AC
  Filled 2021-10-09: qty 5

## 2021-10-09 MED ORDER — PHENYLEPHRINE 40 MCG/ML (10ML) SYRINGE FOR IV PUSH (FOR BLOOD PRESSURE SUPPORT)
PREFILLED_SYRINGE | INTRAVENOUS | Status: DC | PRN
  Administered 2021-10-09: 120 ug via INTRAVENOUS

## 2021-10-09 MED ORDER — MIDAZOLAM HCL 2 MG/2ML IJ SOLN
INTRAMUSCULAR | Status: AC
  Filled 2021-10-09: qty 2

## 2021-10-09 MED ORDER — ONDANSETRON HCL 4 MG PO TABS
4.0000 mg | ORAL_TABLET | Freq: Every day | ORAL | 0 refills | Status: DC | PRN
  Filled 2021-10-09: qty 20, 20d supply, fill #0

## 2021-10-09 MED ORDER — FENTANYL CITRATE PF 50 MCG/ML IJ SOSY
PREFILLED_SYRINGE | INTRAMUSCULAR | Status: AC
  Administered 2021-10-09: 50 ug via INTRAVENOUS
  Filled 2021-10-09: qty 3

## 2021-10-09 MED ORDER — FENTANYL CITRATE (PF) 100 MCG/2ML IJ SOLN
INTRAMUSCULAR | Status: DC | PRN
  Administered 2021-10-09: 50 ug via INTRAVENOUS

## 2021-10-09 MED ORDER — ONDANSETRON HCL 4 MG/2ML IJ SOLN
INTRAMUSCULAR | Status: AC
  Filled 2021-10-09: qty 2

## 2021-10-09 MED ORDER — MIDAZOLAM HCL 2 MG/2ML IJ SOLN: 1.0000 mg | INTRAMUSCULAR | Status: DC

## 2021-10-09 MED ORDER — ACETAMINOPHEN 10 MG/ML IV SOLN
1000.0000 mg | Freq: Once | INTRAVENOUS | Status: AC
  Administered 2021-10-09: 1000 mg via INTRAVENOUS
  Filled 2021-10-09: qty 100

## 2021-10-09 MED ORDER — LACTATED RINGERS IV SOLN: INTRAVENOUS | Status: DC

## 2021-10-09 MED ORDER — SODIUM CHLORIDE 0.9 % IR SOLN
Status: DC | PRN
  Administered 2021-10-09: 6000 mL

## 2021-10-09 MED ORDER — APREPITANT 40 MG PO CAPS
40.0000 mg | ORAL_CAPSULE | Freq: Once | ORAL | Status: AC
  Administered 2021-10-09: 40 mg via ORAL
  Filled 2021-10-09: qty 1

## 2021-10-09 MED ORDER — BUPIVACAINE-EPINEPHRINE (PF) 0.25% -1:200000 IJ SOLN
INTRAMUSCULAR | Status: AC
  Filled 2021-10-09: qty 30

## 2021-10-09 MED ORDER — ONDANSETRON HCL 4 MG/2ML IJ SOLN
INTRAMUSCULAR | Status: DC | PRN
  Administered 2021-10-09: 4 mg via INTRAVENOUS

## 2021-10-09 MED ORDER — DEXAMETHASONE SODIUM PHOSPHATE 10 MG/ML IJ SOLN
8.0000 mg | Freq: Once | INTRAMUSCULAR | Status: AC
  Administered 2021-10-09: 8 mg via INTRAVENOUS

## 2021-10-09 MED ORDER — CHLORHEXIDINE GLUCONATE 0.12 % MT SOLN: 15.0000 mL | Freq: Once | OROMUCOSAL | Status: AC

## 2021-10-09 MED ORDER — DEXAMETHASONE SODIUM PHOSPHATE 10 MG/ML IJ SOLN
INTRAMUSCULAR | Status: AC
  Filled 2021-10-09: qty 1

## 2021-10-09 MED ORDER — PROPOFOL 10 MG/ML IV BOLUS
INTRAVENOUS | Status: DC | PRN
  Administered 2021-10-09: 150 mg via INTRAVENOUS

## 2021-10-09 MED ORDER — LACTATED RINGERS IV SOLN
INTRAVENOUS | Status: DC
Start: 2021-10-09 — End: 2021-10-09

## 2021-10-09 MED ORDER — CHLORHEXIDINE GLUCONATE 4 % EX LIQD: 60.0000 mL | Freq: Once | CUTANEOUS | Status: DC

## 2021-10-09 MED ORDER — FENTANYL CITRATE PF 50 MCG/ML IJ SOSY
PREFILLED_SYRINGE | INTRAMUSCULAR | Status: AC
  Administered 2021-10-09: 50 ug via INTRAVENOUS
  Filled 2021-10-09: qty 2

## 2021-10-09 MED ORDER — PHENYLEPHRINE HCL-NACL 20-0.9 MG/250ML-% IV SOLN
INTRAVENOUS | Status: DC | PRN
  Administered 2021-10-09: 40 ug/min via INTRAVENOUS

## 2021-10-09 MED ORDER — FENTANYL CITRATE (PF) 100 MCG/2ML IJ SOLN
INTRAMUSCULAR | Status: AC
  Filled 2021-10-09: qty 2

## 2021-10-09 MED ORDER — CEFAZOLIN SODIUM-DEXTROSE 2-4 GM/100ML-% IV SOLN
2.0000 g | INTRAVENOUS | Status: AC
  Administered 2021-10-09: 2 g via INTRAVENOUS
  Filled 2021-10-09: qty 100

## 2021-10-09 MED ORDER — FENTANYL CITRATE PF 50 MCG/ML IJ SOSY
50.0000 ug | PREFILLED_SYRINGE | INTRAMUSCULAR | Status: DC
  Administered 2021-10-09: 50 ug via INTRAVENOUS

## 2021-10-09 MED ORDER — EPHEDRINE SULFATE-NACL 50-0.9 MG/10ML-% IV SOSY
PREFILLED_SYRINGE | INTRAVENOUS | Status: DC | PRN
  Administered 2021-10-09: 10 mg via INTRAVENOUS

## 2021-10-09 MED ORDER — ORAL CARE MOUTH RINSE
15.0000 mL | Freq: Once | OROMUCOSAL | Status: AC
  Administered 2021-10-09: 15 mL via OROMUCOSAL

## 2021-10-09 MED ORDER — PROPOFOL 10 MG/ML IV BOLUS
INTRAVENOUS | Status: AC
  Filled 2021-10-09: qty 20

## 2021-10-09 MED ORDER — BUPIVACAINE-EPINEPHRINE 0.25% -1:200000 IJ SOLN
INTRAMUSCULAR | Status: DC | PRN
  Administered 2021-10-09: 20 mL

## 2021-10-09 MED ORDER — ASPIRIN EC 81 MG PO TBEC
81.0000 mg | DELAYED_RELEASE_TABLET | Freq: Every day | ORAL | 0 refills | Status: AC
  Filled 2021-10-09: qty 21, 21d supply, fill #0

## 2021-10-09 SURGICAL SUPPLY — 33 items
BAG COUNTER SPONGE SURGICOUNT (BAG) IMPLANT
BAG SPNG CNTER NS LX DISP (BAG)
BNDG ELASTIC 6X5.8 VLCR STR LF (GAUZE/BANDAGES/DRESSINGS) ×3 IMPLANT
COVER SURGICAL LIGHT HANDLE (MISCELLANEOUS) ×3 IMPLANT
DISSECTOR 4.0MM X 13CM (MISCELLANEOUS) ×3 IMPLANT
DRAPE ARTHROSCOPY W/POUCH 114 (DRAPES) ×3 IMPLANT
DRAPE U-SHAPE 47X51 STRL (DRAPES) ×3 IMPLANT
DRSG EMULSION OIL 3X3 NADH (GAUZE/BANDAGES/DRESSINGS) ×3 IMPLANT
DRSG PAD ABDOMINAL 8X10 ST (GAUZE/BANDAGES/DRESSINGS) ×3 IMPLANT
DURAPREP 26ML APPLICATOR (WOUND CARE) ×3 IMPLANT
GAUZE 4X4 16PLY ~~LOC~~+RFID DBL (SPONGE) ×3 IMPLANT
GAUZE SPONGE 4X4 12PLY STRL (GAUZE/BANDAGES/DRESSINGS) ×3 IMPLANT
GLOVE SRG 8 PF TXTR STRL LF DI (GLOVE) ×2 IMPLANT
GLOVE SURG ENC MOIS LTX SZ6.5 (GLOVE) ×6 IMPLANT
GLOVE SURG ENC MOIS LTX SZ8 (GLOVE) ×6 IMPLANT
GLOVE SURG POLYISO LF SZ7 (GLOVE) ×3 IMPLANT
GLOVE SURG UNDER POLY LF SZ7 (GLOVE) ×6 IMPLANT
GLOVE SURG UNDER POLY LF SZ8 (GLOVE) ×2
GOWN STRL REUS W/ TWL LRG LVL3 (GOWN DISPOSABLE) ×2 IMPLANT
GOWN STRL REUS W/TWL LRG LVL3 (GOWN DISPOSABLE) ×2
KIT BASIN OR (CUSTOM PROCEDURE TRAY) ×3 IMPLANT
MANIFOLD NEPTUNE II (INSTRUMENTS) ×3 IMPLANT
PACK ARTHROSCOPY WL (CUSTOM PROCEDURE TRAY) ×3 IMPLANT
PADDING CAST ABS 6INX4YD NS (CAST SUPPLIES) ×1
PADDING CAST ABS COTTON 6X4 NS (CAST SUPPLIES) IMPLANT
PADDING CAST COTTON 6X4 STRL (CAST SUPPLIES) ×3 IMPLANT
PORT APPOLLO RF 90DEGREE MULTI (SURGICAL WAND) ×3 IMPLANT
PROTECTOR NERVE ULNAR (MISCELLANEOUS) ×3 IMPLANT
SUT ETHILON 4 0 PS 2 18 (SUTURE) ×3 IMPLANT
TOWEL OR 17X26 10 PK STRL BLUE (TOWEL DISPOSABLE) ×3 IMPLANT
TUBING ARTHROSCOPY IRRIG 16FT (MISCELLANEOUS) ×3 IMPLANT
TUBING CONNECTING 10 (TUBING) ×3 IMPLANT
WRAP KNEE MAXI GEL POST OP (GAUZE/BANDAGES/DRESSINGS) ×9 IMPLANT

## 2021-10-09 NOTE — Transfer of Care (Signed)
Immediate Anesthesia Transfer of Care Note ? ?Patient: Patricia Collier ? ?Procedure(s) Performed: Right knee arthroscopy; lateral meniscal debridement (Right: Knee) ? ?Patient Location: PACU ? ?Anesthesia Type:General ? ?Level of Consciousness: awake and alert  ? ?Airway & Oxygen Therapy: Patient Spontanous Breathing and Patient connected to face mask oxygen ? ?Post-op Assessment: Report given to RN, Post -op Vital signs reviewed and stable and Patient moving all extremities X 4 ? ?Post vital signs: Reviewed and stable ? ?Last Vitals:  ?Vitals Value Taken Time  ?BP 112/46   ?Temp    ?Pulse 69   ?Resp 16 10/09/21 1543  ?SpO2 100   ?Vitals shown include unvalidated device data. ? ?Last Pain:  ?Vitals:  ? 10/09/21 1220  ?TempSrc: Oral  ?   ? ?  ? ?Complications: No notable events documented. ?

## 2021-10-09 NOTE — Op Note (Signed)
Operative Report- KNEE ARTHROSCOPY ? ?Preoperative diagnosis-  Right knee lateral meniscal tear ? ?Postoperative diagnosis Right- knee lateral meniscal tear  ? ?Procedure- Right knee arthroscopy with lateral ? meniscal debridement   ? ?Surgeon- Dione Plover. Amarachi Kotz, MD ? ?Anesthesia-General ? ?EBL-  Minimal ? ?Complications- None ? ?Condition- PACU - hemodynamically stable. ? ?Brief clinical note- -Patricia Collier is a 76 y.o.  female with a several month history of Right pain and mechanical symptoms. Exam and history suggested lateral  meniscal tear confirmed by MRI. The patient presents now for arthroscopy and debridement ? ? ?Procedure in detail - ? ?     After successful administration of General anesthetic, the Right lower extremity is prepped and draped in the usual sterile fashion. Time out is performed by the surgical team. Standard superomedial and inferolateral portal sites are marked and incisions made with an 11 blade. The inflow cannula is passed through the superomedial portal and camera through the inferolateral portal and inflow is initiated. Arthroscopic visualization proceeds. ?     The undersurface of the patella and trochlea are visualized and there is mild chondromalacia but no unstable tears. The medial and lateral gutters are visualized and there are  no loose bodies. Flexion and valgus force is applied to the knee and the medial compartment is entered. A spinal needle is passed into the joint through the site marked for the inferomedial portal. A small incision is made and the dilator passed into the joint. The findings for the medial compartment are normal . ?   The intercondylar notch is visualized and the ACL appears normal . The lateral compartment is entered and the findings are unstable tear anterior horn lateral meniscus and Grade II chondromalacia lateral femoral condyle . The tear is debrided to a stable base with baskets and a shaver and sealed off with the Arthrocare.  It is  probed and found to be stable. ?    The joint is again inspected and there are no other tears, defects or loose bodies identified. The arthroscopic equipment is then removed from the inferior portals which are closed with interrupted 4-0 nylon. 20 ml of .25% Marcaine with epinephrine are injected through the inflow cannula and the cannula is then removed and the portal closed with nylon. The incisions are cleaned and dried and a bulky sterile dressing is applied. The patient is then awakened and transported to recovery in stable condition. ? ? ?10/09/2021, 3:36 PM ? ?

## 2021-10-09 NOTE — H&P (Signed)
CC- Patricia Collier is a 76 y.o. female who presents with right knee pain. ? ?HPI- . Knee Pain: Patient presents with knee pain involving the  right knee. Onset of the symptoms was several months ago. Inciting event: none known. Current symptoms include giving out, pain located anterolaterally, and stiffness. Pain is aggravated by lateral movements, pivoting, rising after sitting, squatting, and walking.  Patient has had no prior knee problems. Evaluation to date: MRI: abnormal lateral meniscal tear . Treatment to date: ice, OTC analgesics which are not very effective, and rest. ? ?Past Medical History:  ?Diagnosis Date  ? Arthritis   ? ostearthritis -joints, greater left knee.  ? Breast cancer ([place])   ? Breast cancer, left breast ([place]) 2009  ? S/P surgery, radiation, chemotherapy.-no further oncology visits.  ? CHF (congestive heart failure) ([place]) dx'd 02/05/2017  ? Hyperlipidemia   ? Hypertension   ? Neuromuscular disorder ([place])   ? neuropathy due to chemotherapy  ? Personal history of chemotherapy 2009  ? Personal history of radiation therapy 2009  ? Seizures ([place])   ? "childbirth related"  ? Sleep apnea   ? mild no C-PAP  ? SVT (supraventricular tachycardia) ([place])   ? x1 episode -3 yrs ago- no issues since.All heart testing negative. No further cardiac visits needed.  ? ? ?Past Surgical History:  ?Procedure Laterality Date  ? ABDOMINAL HYSTERECTOMY  1984  ? BILATERAL SALPINGOOPHORECTOMY Bilateral 2000s  ? BREAST BIOPSY Left 2009  ? BREAST LUMPECTOMY Left 2009  ? sentinel node dissection  ? BUNIONECTOMY Bilateral early 2000s  ? CARDIAC CATHETERIZATION  2015  ? DIAGNOSTIC LAPAROSCOPY  1990s-early 2000s  ? multiple for adhesions  ? DILATION AND CURETTAGE OF UTERUS  2000  ? w/laparoscopies  ? HAMMER TOE SURGERY Right 09/2015; 06/22/2016; 12/28/2016  ? 2nd digit  ? KNEE ARTHROSCOPY Left 09/21/2015  ? Procedure: LEFT ARTHROSCOPY KNEE WITH LATERAL MENISCUS DEBRIDEMENT;  Surgeon: Gaynelle Arabian, MD;  [facility];   Service: Orthopedics;  Laterality: Left;  ? LAPAROSCOPIC CHOLECYSTECTOMY    ? RHINOPLASTY  1979  ? RIGHT/LEFT HEART CATH AND CORONARY ANGIOGRAPHY N/A 02/06/2017  ? Procedure: Right/Left Heart Cath and Coronary Angiography;  Surgeon: Jolaine Artist, MD;  Location: [facility];  Service: Cardiovascular;  Laterality: N/A;  ? [place]  ? TRANSANAL RECTOPEXY  1990s  ? ? ?Prior to Admission medications   ?Medication Sig Start Date End Date Taking? Authorizing Provider  ?Biotin 5 MG CAPS Take 5 mg by mouth daily.   Yes [provider]  ?carvedilol (COREG) 3.125 MG tablet Take 1 tablet (3.125 mg total) by mouth 2 (two) times daily. 09/20/21  Yes Bensimhon, Shaune Pascal, MD  ?Cholecalciferol (VITAMIN D-3) 1000 units CAPS Take 1,000 Units by mouth daily.   Yes [provider]  ?empagliflozin (JARDIANCE) 10 MG TABS tablet Take 1 tablet (10 mg total) by mouth daily before breakfast. 08/21/21  Yes Bensimhon, Shaune Pascal, MD  ?ivabradine (CORLANOR) 5 MG TABS tablet Take 1 tablet (5 mg total) by mouth 2 (two) times daily with a meal. 09/20/21  Yes Bensimhon, Shaune Pascal, MD  ?losartan (COZAAR) 25 MG tablet TAKE 1 TABLET BY MOUTH TWO TIMES DAILY 04/28/21 04/28/22 Yes Bensimhon, Shaune Pascal, MD  ?Magnesium 250 MG TABS Take 250 mg by mouth daily.   Yes [provider]  ?mexiletine (MEXITIL) 150 MG capsule TAKE 1 CAPSULE BY MOUTH 2 TIMES DAILY. 02/22/21 02/22/22 Yes Bensimhon, Shaune Pascal, MD  ?rosuvastatin ([place])  10 MG tablet Take 1 tablet (10 mg total) by mouth at bedtime. 06/07/21  Yes [name], MD  ?scopolamine (TRANSDERM-SCOP) 1 MG/3DAYS Place 1 patch onto the skin daily as needed (travel sickness).   Yes [provider]  ?spironolactone (ALDACTONE) 25 MG tablet TAKE 1/2 TABLET BY MOUTH DAILY 03/21/21 03/21/22 Yes Bensimhon, Shaune Pascal, MD  ?zinc gluconate 50 MG tablet Take 50 mg by mouth daily.   Yes [provider]  ? ?Right knee exam ?soft tissue  tenderness over lateral joint line, no effusion, negative drawer sign, collateral ligaments intact ? ?Physical Examination:  ?Mental status - alert, oriented to person, place, and time ?Chest - clear to auscultation, no wheezes, rales or rhonchi, symmetric air entry ?Heart - normal rate, regular rhythm, normal S1, S2, no murmurs, rubs, clicks or gallops ?Abdomen - soft, nontender, nondistended, no masses or organomegaly ?Neurological - alert, oriented, normal speech, no focal findings or movement disorder noted ? ? Asessment/Plan---Right knee lateral meniscal tear- - Plan right knee arthroscopy with meniscal debridement. Procedure risks and potential comps discussed with patient who elects to proceed. Goals are decreased pain and increased function with a high likelihood of achieving both  ? ? ?

## 2021-10-09 NOTE — Discharge Instructions (Addendum)
? ?Dr. Pilar Plate Aluisio ?Total Joint Specialist ?[facility] ?[address] ?[place], [place] [address] ?[phone] ? ? ?Arthroscopic Procedure, Knee ?An arthroscopic procedure can find what is wrong with your knee. ?PROCEDURE ?Arthroscopy is a surgical technique that allows your orthopedic surgeon to diagnose and treat your knee injury with accuracy. They will look into your knee through a small instrument. This is almost like a small (pencil sized) telescope. Because arthroscopy affects your knee less than open knee surgery, you can anticipate a more rapid recovery. Taking an active role by following your caregiver's instructions will help with rapid and complete recovery. Use crutches, rest, elevation, ice, and knee exercises as instructed. The length of recovery depends on various factors including type of injury, age, physical condition, medical conditions, and your rehabilitation. ?Your knee is the joint between the large bones (femur and tibia) in your leg. Cartilage covers these bone ends which are smooth and slippery and allow your knee to bend and move smoothly. Two menisci, thick, semi-lunar shaped pads of cartilage which form a rim inside the joint, help absorb shock and stabilize your knee. Ligaments bind the bones together and support your knee joint. Muscles move the joint, help support your knee, and take stress off the joint itself. Because of this all programs and physical therapy to rehabilitate an injured or repaired knee require rebuilding and strengthening your muscles. ?AFTER THE PROCEDURE ?After the procedure, you will be moved to a recovery area until most of the effects of the medication have worn off. Your caregiver will discuss the test results with you.  ?Only take over-the-counter or prescription medicines for pain, discomfort, or fever as directed by your caregiver.  ?SEEK MEDICAL CARE IF:  ?You have increased bleeding from your wounds.  ?You see redness, swelling, or  have increasing pain in your wounds.  ?You have pus coming from your wound.  ?You have an oral temperature above 102? F (38.9? C).  ?You notice a bad smell coming from the wound or dressing.  ?You have severe pain with any motion of your knee.  ?SEEK IMMEDIATE MEDICAL CARE IF:  ?You develop a rash.  ?You have difficulty breathing.  ?You have any allergic problems.  ?FURTHER INSTRUCTIONS:  ?ICE to the affected knee every three hours for 30 minutes at a time and then as needed for pain and swelling.  Continue to use ice on the knee for pain and swelling from surgery. You may notice swelling that will progress down to the foot and ankle.  This is normal after surgery.  Elevate the leg when you are not up walking on it.   ? ?DIET ?You may resume your previous home diet once your are discharged from the hospital. ? ?DRESSING / WOUND CARE / SHOWERING ?You may change your dressing 3-5 days after surgery.  Then change the dressing every day with sterile gauze.  Please use good hand washing techniques before changing the dressing.  Do not use any lotions or creams on the incision until instructed by your surgeon. ?You may start showering two days after being discharged home but do not submerge the incisions under water.  ?Change dressing 48 hours after the procedure and then cover the small incisions with band aids until your follow up visit. ?Change the surgical dressings daily and reapply a dry dressing each time.  ? ?ACTIVITY ?Walk with your walker as instructed. ?Use walker as long as suggested by your caregivers. ?Avoid periods of inactivity such as sitting longer  than an hour when not asleep. This helps prevent blood clots.  ?You may resume a sexual relationship in one month or when given the OK by your doctor.  ?You may return to work once you are cleared by your doctor.  ?Do not drive a car for 6 weeks or until released by you surgeon.  ?Do not drive while taking narcotics. ? ?WEIGHT BEARING ?Weight bearing as  tolerated with assist device (walker, cane, etc) as directed, use it as long as suggested by your surgeon or therapist, typically at least 4-6 weeks. ? ?POSTOPERATIVE CONSTIPATION PROTOCOL ?Constipation - defined medically as fewer than three stools per week and severe constipation as less than one stool per week. ? ?One of the most common issues patients have following surgery is constipation.  Even if you have a regular bowel pattern at home, your normal regimen is likely to be disrupted due to multiple reasons following surgery.  Combination of anesthesia, postoperative narcotics, change in appetite and fluid intake all can affect your bowels.  In order to avoid complications following surgery, here are some recommendations in order to help you during your recovery period. ? ?Colace (docusate) - Pick up an over-the-counter form of Colace or another stool softener and take twice a day as long as you are requiring postoperative pain medications.  Take with a full glass of water daily.  If you experience loose stools or diarrhea, hold the colace until you stool forms back up.  If your symptoms do not get better within 1 week or if they get worse, check with your doctor. ? ?Dulcolax (bisacodyl) - Pick up over-the-counter and take as directed by the product packaging as needed to assist with the movement of your bowels.  Take with a full glass of water.  Use this product as needed if not relieved by Colace only.  ? ?MiraLax (polyethylene glycol) - Pick up over-the-counter to have on hand.  MiraLax is a solution that will increase the amount of water in your bowels to assist with bowel movements.  Take as directed and can mix with a glass of water, juice, soda, coffee, or tea.  Take if you go more than two days without a movement. ?Do not use MiraLax more than once per day. Call your doctor if you are still constipated or irregular after using this medication for 7 days in a row. ? ?If you continue to have problems  with postoperative constipation, please contact the office for further assistance and recommendations.  If you experience "the worst abdominal pain ever" or develop nausea or vomiting, please contact the office immediatly for further recommendations for treatment. ? ?ITCHING ? If you experience itching with your medications, try taking only a single pain pill, or even half a pain pill at a time.  You can also use Benadryl over the counter for itching or also to help with sleep.  ? ?TED HOSE STOCKINGS ?Wear the elastic stockings on both legs for three weeks following surgery during the day but you may remove then at night for sleeping. ? ?MEDICATIONS ?See your medication summary on the ?After Visit Summary? that the nursing staff will review with you prior to discharge.  You may have some home medications which will be placed on hold until you complete the course of blood thinner medication.  It is important for you to complete the blood thinner medication as prescribed by your surgeon.  Continue your approved medications as instructed at time of discharge. ?Do not drive while  taking narcotics.  ? ?PRECAUTIONS ?If you experience chest pain or shortness of breath - call 911 immediately for transfer to the hospital emergency department.  ?If you develop a fever greater that 101 F, purulent drainage from wound, increased redness or drainage from wound, foul odor from the wound/dressing, or calf pain - CONTACT YOUR SURGEON.   ?                                                ?FOLLOW-UP APPOINTMENTS ?Make sure you keep all of your appointments after your operation with your surgeon and caregivers. You should call the office at [phone]  and make an appointment for approximately one week after the date of your surgery or on the date instructed by your surgeon outlined in the "After Visit Summary". ? ?RANGE OF MOTION AND STRENGTHENING EXERCISES  ?Rehabilitation of the knee is important following a knee injury or an  operation. After just a few days of immobilization, the muscles of the thigh which control the knee become weakened and shrink (atrophy). Knee exercises are designed to build up the tone and strength of the thigh

## 2021-10-09 NOTE — Anesthesia Procedure Notes (Signed)
Procedure Name: LMA Insertion ?Date/Time: 10/09/2021 2:57 PM ?Performed by: Niel Hummer, CRNA ?Pre-anesthesia Checklist: Patient identified, Emergency Drugs available, Suction available and Patient being monitored ?Patient Re-evaluated:Patient Re-evaluated prior to induction ?Oxygen Delivery Method: Circle system utilized ?Preoxygenation: Pre-oxygenation with 100% oxygen ?Induction Type: IV induction ?LMA: LMA with gastric port inserted ?LMA Size: 4.0 ?Number of attempts: 1 ?Dental Injury: Teeth and Oropharynx as per pre-operative assessment  ? ? ? ? ?

## 2021-10-10 ENCOUNTER — Encounter (HOSPITAL_COMMUNITY): Payer: Self-pay | Admitting: Orthopedic Surgery

## 2021-10-10 NOTE — Anesthesia Postprocedure Evaluation (Signed)
Anesthesia Post Note ? ?Patient: Patricia Collier ? ?Procedure(s) Performed: Right knee arthroscopy; lateral meniscal debridement (Right: Knee) ? ?  ? ?Patient location during evaluation: PACU ?Anesthesia Type: General ?Level of consciousness: awake and alert ?Pain management: pain level controlled ?Vital Signs Assessment: post-procedure vital signs reviewed and stable ?Respiratory status: spontaneous breathing, nonlabored ventilation, respiratory function stable and patient connected to nasal cannula oxygen ?Cardiovascular status: blood pressure returned to baseline and stable ?Postop Assessment: no apparent nausea or vomiting ?Anesthetic complications: no ? ? ?No notable events documented. ? ?Last Vitals:  ?Vitals:  ? 10/09/21 1615 10/09/21 1638  ?BP: (!) 120/59 127/61  ?Pulse: 82 85  ?Resp: 12 16  ?Temp:    ?SpO2: 100% 100%  ?  ?Last Pain:  ?Vitals:  ? 10/09/21 1638  ?TempSrc:   ?PainSc: 0-No pain  ? ? ?  ?  ?  ?  ?  ?  ? ?March Rummage Purnell Daigle ? ? ? ? ?

## 2021-10-19 ENCOUNTER — Other Ambulatory Visit: Payer: Self-pay | Admitting: Family Medicine

## 2021-10-19 ENCOUNTER — Other Ambulatory Visit (HOSPITAL_COMMUNITY): Payer: Self-pay

## 2021-10-19 MED ORDER — ROSUVASTATIN CALCIUM 10 MG PO TABS
10.0000 mg | ORAL_TABLET | Freq: Every evening | ORAL | 1 refills | Status: DC
  Filled 2021-10-19 – 2021-12-24 (×2): qty 90, 90d supply, fill #0
  Filled 2022-03-24: qty 90, 90d supply, fill #1

## 2021-10-19 MED FILL — Mexiletine HCl Cap 150 MG: ORAL | 30 days supply | Qty: 60 | Fill #6 | Status: CN

## 2021-10-19 MED FILL — Mexiletine HCl Cap 150 MG: ORAL | 30 days supply | Qty: 60 | Fill #6 | Status: AC

## 2021-10-20 ENCOUNTER — Other Ambulatory Visit (HOSPITAL_COMMUNITY): Payer: Self-pay

## 2021-11-06 ENCOUNTER — Ambulatory Visit (INDEPENDENT_AMBULATORY_CARE_PROVIDER_SITE_OTHER): Payer: PPO | Admitting: Family Medicine

## 2021-11-06 ENCOUNTER — Encounter: Payer: Self-pay | Admitting: Family Medicine

## 2021-11-06 VITALS — BP 118/70 | HR 76 | Temp 98.4°F | Resp 16 | Ht 63.5 in | Wt 146.8 lb

## 2021-11-06 DIAGNOSIS — K5792 Diverticulitis of intestine, part unspecified, without perforation or abscess without bleeding: Secondary | ICD-10-CM

## 2021-11-06 NOTE — Patient Instructions (Signed)
Follow up as needed or as scheduled ?START once daily Miralax in at least 8 oz of liquid ?Continue to drink LOTS of fluids ?Avoid nuts, seeds, popcorn, or other foods w/ sharp edges ?Call if anything changes or worsens ?HANG IN THERE!!! ?

## 2021-11-06 NOTE — Progress Notes (Signed)
? ?  Subjective:  ? ? Patient ID: Patricia Collier, female    DOB: 04/12/1946, 76 y.o.   MRN: [id number] ? ?HPI ?Abd pain- pt reports she has had intermittent episodes of LLQ pain since 4/7.  Very tender to the touch, 'could not touch my skin'.  Had some bleeding due to constipation.  Was unable to eat or drink x4 days.  Pt reports she can tell when she's constipated b/c 'it hurts more'.  Pt describes the pain as 'burning' at this point.  Pain is typically a 2 but will get to 3 if prolonged sitting.  No rash on skin.  Pt is able to eat and drink at this time.  Pt has known diverticula from colonoscopy 2013.  Has appt upcoming w/ Dr Watt Climes on Thursday.  Has not had any abx.  No N/V.  Currently on probiotics.  Last BM yesterday. ? ? ?Review of Systems ?For ROS see HPI  ?   ?Objective:  ? Physical Exam ?Vitals reviewed.  ?Constitutional:   ?   General: She is not in acute distress. ?   Appearance: She is well-developed. She is not ill-appearing.  ?HENT:  ?   Head: Normocephalic and atraumatic.  ?Abdominal:  ?   General: Abdomen is flat. Bowel sounds are normal. There is no distension.  ?   Palpations: Abdomen is soft. There is no fluid wave or mass.  ?   Tenderness: There is abdominal tenderness in the left lower quadrant. There is no guarding or rebound.  ?Skin: ?   General: Skin is warm and dry.  ?Neurological:  ?   General: No focal deficit present.  ?   Mental Status: She is alert and oriented to person, place, and time.  ?Psychiatric:     ?   Mood and Affect: Mood normal.     ?   Behavior: Behavior normal.  ? ? ? ? ? ?   ?Assessment & Plan:  ?Diverticulitis- new.  Pt has known diverticula from prior colonoscopy.  At this time, sxs have nearly resolved w/ exception of mild LLQ.  Has GI f/u later this week.  At this time will hold on abx but will start daily Miralax to avoid constipation which worsens her pain.  Reviewed supportive care and red flags that should prompt return.  Pt expressed understanding and is in  agreement w/ plan. r ? ?

## 2021-11-09 DIAGNOSIS — K625 Hemorrhage of anus and rectum: Secondary | ICD-10-CM | POA: Diagnosis not present

## 2021-11-09 DIAGNOSIS — Z1211 Encounter for screening for malignant neoplasm of colon: Secondary | ICD-10-CM | POA: Diagnosis not present

## 2021-11-09 DIAGNOSIS — R1032 Left lower quadrant pain: Secondary | ICD-10-CM | POA: Diagnosis not present

## 2021-11-09 DIAGNOSIS — K5900 Constipation, unspecified: Secondary | ICD-10-CM | POA: Diagnosis not present

## 2021-11-09 DIAGNOSIS — Z8719 Personal history of other diseases of the digestive system: Secondary | ICD-10-CM | POA: Diagnosis not present

## 2021-11-13 ENCOUNTER — Telehealth: Payer: Self-pay | Admitting: Pharmacist

## 2021-11-13 NOTE — Progress Notes (Signed)
Chronic Care Management Pharmacy Assistant   Name: Patricia Collier  MRN: [id number] DOB: 04-24-1946   Reason for Encounter: Disease State - General Adherence Call     Recent office visits:  11/06/21 Annye Asa, MD - Family Medicine - Diverticulitis - START once daily Miralax in at least 8 oz of liquid. Follow up as scheduled.  06/13/21 Annye Asa, MD - Hyperlipidemia - Labs were ordered. scopolamine (TRANSDERM-SCOP) 1 MG/3DAYS. Follow up in 6 months.   Recent consult visits:  08/24/21 Lazarus Gowda [place] - No notes available   Hospital visits: 10/09/21 Medication Reconciliation was completed by comparing discharge summary, patient's EMR and Pharmacy list, and upon discussion with patient.  Admitted to the hospital on 10/09/21 due to Right knee arthroscopy. Discharge date was 10/09/21. Discharged from [facility]?Medications Started at [facility] Discharge:?? aspirin EC methocarbamol (Robaxin) ondansetron (Zofran) oxyCODONE (Roxicodone)  Medication Changes at Hospital Discharge: None noted.   Medications Discontinued at Hospital Discharge: None noted.  Medications that remain the same after Hospital Discharge:??  All other medications will remain the same.     Medications: Outpatient Encounter Medications as of 11/13/2021  Medication Sig   Biotin 5 MG CAPS Take 5 mg by mouth daily.   carvedilol (COREG) 3.125 MG tablet Take 1 tablet (3.125 mg total) by mouth 2 (two) times daily.   Cholecalciferol (VITAMIN D-3) 1000 units CAPS Take 1,000 Units by mouth daily.   empagliflozin (JARDIANCE) 10 MG TABS tablet Take 1 tablet (10 mg total) by mouth daily before breakfast.   ivabradine (CORLANOR) 5 MG TABS tablet Take 1 tablet (5 mg total) by mouth 2 (two) times daily with a meal.   losartan (COZAAR) 25 MG tablet TAKE 1 TABLET BY MOUTH TWO TIMES DAILY   Magnesium 250 MG TABS Take 250 mg by mouth daily.   mexiletine (MEXITIL) 150 MG capsule TAKE 1  CAPSULE BY MOUTH 2 TIMES DAILY.   rosuvastatin (CRESTOR) 10 MG tablet Take 1 tablet (10 mg total) by mouth at bedtime.   spironolactone (ALDACTONE) 25 MG tablet TAKE 1/2 TABLET BY MOUTH DAILY   zinc gluconate 50 MG tablet Take 50 mg by mouth daily.   No facility-administered encounter medications on file as of 11/13/2021.    Contacted [name] for General Review Call   Chart Review:  Have there been any documented new, changed, or discontinued medications since last visit? No (If yes, include name, dose, frequency, date) Has there been any documented recent hospitalizations or ED visits since last visit with Clinical Pharmacist? Yes Brief Summary (including medication and/or Diagnosis changes): 10/09/21 Right knee surgery.   Adherence Review:  Does the Clinical Pharmacist Assistant have access to adherence rates? Yes Adherence rates for STAR metric medications (List medication(s)/day supply/ last 2 fill dates). Adherence rates for medications indicated for disease state being reviewed (List medication(s)/day supply/ last 2 fill dates). Does the patient have >5 day gap between last estimated fill dates for any of the above medications or other medication gaps? No Reason for medication gaps.   Disease State Questions:  Able to connect with Patient? Yes Did patient have any problems with their health recently? No Note problems and Concerns: Have you had any admissions or emergency room visits or worsening of your condition(s) since last visit? Yes Details of ED visit, hospital visit and/or worsening condition(s): Have you had any visits with new specialists or providers since your last visit? Yes Explain: 10/09/21 Right knee  surgery Have you had any new health care problem(s) since your last visit? No New problem(s) reported: Have you run out of any of your medications since you last spoke with clinical pharmacist? No What caused you to run out of your medications? Are there  any medications you are not taking as prescribed? No What kept you from taking your medications as prescribed? Are you having any issues or side effects with your medications? No Note of issues or side effects: Do you have any other health concerns or questions you want to discuss with your Clinical Pharmacist before your next visit? No Note additional concerns and questions from Patient. Are there any health concerns that you feel we can do a better job addressing? No Note Patient's response. Are you having any problems with any of the following since the last visit: (select all that apply)  None  Details: 12. Any falls since last visit? No  Details: 13. Any increased or uncontrolled pain since last visit? No  Details: 14. Next visit Type: office       Visit with: Dr Haroldine Laws Cardiology         Date:11/16/21        Time: 1:40 pm  15. Additional Details? No    Care Gaps  AWV: done 06/20/20 Colonoscopy: done 07/18/11 DM Eye Exam: N/A DM Foot Exam: N/A Microalbumin: N/A HbgAIC:  N/A DEXA: done 01/17/04 Mammogram: done 03/21/21   Star Rating Drugs: rosuvastatin (CRESTOR) 10 MG tablet - last filled 09/13/21 90 days  losartan (COZAAR) 25 MG tablet - last filled 10/19/21 90 days  empagliflozin (JARDIANCE) 10 MG TABS tablet - last filled 10/07/21 30 days    Future Appointments  Date Time Provider [place]  11/16/2021  1:40 PM Bensimhon, Shaune Pascal, MD [facility] None  12/13/2021 10:00 AM [name], MD [facility] [place], [facility] Clinical Pharmacist Assistant  [phone]

## 2021-11-16 ENCOUNTER — Ambulatory Visit (HOSPITAL_COMMUNITY)
Admission: RE | Admit: 2021-11-16 | Discharge: 2021-11-16 | Disposition: A | Discharge status: Home / Self Care | Payer: PPO | Source: Ambulatory Visit | Attending: Internal Medicine | Admitting: Internal Medicine

## 2021-11-16 ENCOUNTER — Other Ambulatory Visit (HOSPITAL_COMMUNITY): Payer: Self-pay

## 2021-11-16 ENCOUNTER — Encounter (HOSPITAL_COMMUNITY): Payer: Self-pay | Admitting: Internal Medicine

## 2021-11-16 VITALS — BP 120/78 | HR 68 | Wt 144.8 lb

## 2021-11-16 DIAGNOSIS — I447 Left bundle-branch block, unspecified: Secondary | ICD-10-CM | POA: Diagnosis not present

## 2021-11-16 DIAGNOSIS — I5022 Chronic systolic (congestive) heart failure: Secondary | ICD-10-CM

## 2021-11-16 DIAGNOSIS — I5023 Acute on chronic systolic (congestive) heart failure: Secondary | ICD-10-CM | POA: Diagnosis not present

## 2021-11-16 DIAGNOSIS — I493 Ventricular premature depolarization: Secondary | ICD-10-CM | POA: Diagnosis not present

## 2021-11-16 DIAGNOSIS — Z79899 Other long term (current) drug therapy: Secondary | ICD-10-CM | POA: Diagnosis not present

## 2021-11-16 DIAGNOSIS — Z853 Personal history of malignant neoplasm of breast: Secondary | ICD-10-CM | POA: Insufficient documentation

## 2021-11-16 DIAGNOSIS — I2510 Atherosclerotic heart disease of native coronary artery without angina pectoris: Secondary | ICD-10-CM | POA: Diagnosis not present

## 2021-11-16 DIAGNOSIS — R911 Solitary pulmonary nodule: Secondary | ICD-10-CM | POA: Diagnosis not present

## 2021-11-16 DIAGNOSIS — R002 Palpitations: Secondary | ICD-10-CM | POA: Insufficient documentation

## 2021-11-16 DIAGNOSIS — Z923 Personal history of irradiation: Secondary | ICD-10-CM | POA: Diagnosis not present

## 2021-11-16 DIAGNOSIS — I110 Hypertensive heart disease with heart failure: Secondary | ICD-10-CM | POA: Insufficient documentation

## 2021-11-16 DIAGNOSIS — I080 Rheumatic disorders of both mitral and aortic valves: Secondary | ICD-10-CM | POA: Diagnosis not present

## 2021-11-16 DIAGNOSIS — I350 Nonrheumatic aortic (valve) stenosis: Secondary | ICD-10-CM

## 2021-11-16 DIAGNOSIS — E785 Hyperlipidemia, unspecified: Secondary | ICD-10-CM | POA: Insufficient documentation

## 2021-11-16 DIAGNOSIS — I428 Other cardiomyopathies: Secondary | ICD-10-CM | POA: Diagnosis not present

## 2021-11-16 LAB — BASIC METABOLIC PANEL
Anion gap: 6 (ref 5–15)
BUN: 13 mg/dL (ref 8–23)
CO2: 25 mmol/L (ref 22–32)
Calcium: 9.4 mg/dL (ref 8.9–10.3)
Chloride: 108 mmol/L (ref 98–111)
Creatinine, Ser: 0.72 mg/dL (ref 0.44–1.00)
GFR, Estimated: >60 mL/min (ref >60)
Glucose, Bld: 97 mg/dL (ref 70–99)
Potassium: 4.2 mmol/L (ref 3.5–5.1)
Sodium: 139 mmol/L (ref 135–145)

## 2021-11-16 LAB — CBC
HCT: 40.8 % (ref 36.0–46.0)
Hemoglobin: 13.6 g/dL (ref 12.0–15.0)
MCH: 29.7 pg (ref 26.0–34.0)
MCHC: 33.3 g/dL (ref 30.0–36.0)
MCV: 89.1 fL (ref 80.0–100.0)
Platelets: 221 10*3/uL (ref 150–400)
RBC: 4.58 MIL/uL (ref 3.87–5.11)
RDW: 13 % (ref 11.5–15.5)
WBC: 4.8 10*3/uL (ref 4.0–10.5)
nRBC: 0 % (ref 0.0–0.2)

## 2021-11-16 LAB — BRAIN NATRIURETIC PEPTIDE: B Natriuretic Peptide: 77.4 pg/mL (ref 0.0–100.0)

## 2021-11-16 MED ORDER — EMPAGLIFLOZIN 10 MG PO TABS
10.0000 mg | ORAL_TABLET | Freq: Every day | ORAL | 1 refills | Status: DC
  Filled 2021-11-16: qty 90, 90d supply, fill #0
  Filled 2022-02-18: qty 90, 90d supply, fill #1

## 2021-11-16 NOTE — Progress Notes (Addendum)
?Advanced Heart Failure Clinic Note  ? ?Primary Cardiologist: Dr. Haroldine Laws  ? 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 ?Admitted 7/18 with new onset acute systolic CHF. EF 15-20%. Right and left heart cath done and showed  moderate non-obstructive CAD Fick output/index 3.1/1.8. ? ?Holter monitor 8/18: Multiform PVCs, couplets, and triplets were noted with periods of bigeminy. PVC burden < 1%.No VT observed. ? ?Echo 9/19: EF 45-50% moderate AS mean gradient 75mHG ?Echo 8/21: EF 50-55% mild to moderate AS mean 127mG ? ?Zio 11/21: SR 72. 1.3% PVCs. .  ? ?Echo  08/21/21: EF 45-50% (EF 40-45%) with LBBB dyssynchrony. Mild-mod AS mean gradient 13 AVA 1.01 cm2  ? ?Here for routine f/u. At last visit, I increased carvedilol, stopped ivabradine and added Jardiance. She felt terrible so cut carvedilol back, restarted ivabradine and stopped Jardiance -> felt better. Developed acute diverticulitis in April. Treated conservatively. Feels ok but says she can't walk up stairs any more due to SOB. No edema, orthopnea or PND.  ? ? ?Cardiac studies:  ? ?Zio 11/21 ?1. Sinus rhythm - avg HR of 72 ?2. Isolated PACs were rare ?3. Occasional PVCs  (1.3%, 17991) ?4. No high-grade arrhythmias ?5. Patient triggered events associated with isolated PVCs.  ? ? ?CPX 01/2018 ?Peak VO2: 21.5 (108% predicted peak VO2) ?VE/VCO2 slope:  48 ?OUES: 1.84 ?Peak RER: 0.93 ? ?Echo 05/09/17 EF ~30% RV ok. Personally reviewed ? ?Echo 02/05/17 ?LVEF 15-20%, Grade 1 DD, Mild/Mod central MR, Mild LAE ? ?R/LHC 02/06/17 ?There is severe left ventricular systolic dysfunction. ?Dist Cx lesion, 20 %stenosed. ?1st Mrg lesion, 60 %stenosed. ?Dist RCA lesion, 20  %stenosed. ?Mid LAD to Dist LAD lesion, 20 %stenosed. ? ? ?Cardiac MRI 06/2017 ?1.  Moderate LVE with diffuse hypokinesis EF 35% ?2. Small area of apical hyperenhancement on delayed gadolinium images ?3.  Mild MR ?4.  Mild LAE ?5. Thickened tri leaflet AV with restricted leaflet motion suggest echo for AS correlation ?6.  Normal RV size and function ?7.  Normal aortic root 3.0 cm ? ?Review of systems complete and found to be negative unless listed in HPI.  ? ?Past Medical History:  ?Diagnosis Date  ? Arthritis   ? ostearthritis -joints, greater left knee.  ? [place] ([facility] [place]  ? [place] [place]  ? S/P surgery, radiation, chemotherapy.-no further oncology visits.  ? CHF (congestive heart failure) ([place] 02/05[place]  ? Hyperlipidemia   ? Hypertension   ? 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 Personal history of chemotherapy 2009  ? Personal history of radiation therapy 2009  ? Seizures ([facility] [place]  ? "childbirth related"  ? Sleep apnea   ? mild no C-PAP  ? SVT (supraventricular tachycardia) ([facility]  ? x1 episode -3 yrs ago- no issues since.All heart testing negative. No further cardiac visits needed.  ? ?Current Outpatient Medications  ?Medication Sig Dispense Refill  ? carvedilol (COREG) 3.125 MG tablet Take 1 tablet (3.125 mg total) by mouth 2 (two) times daily. 60 tablet 3  ? Cholecalciferol (VITAMIN D-3) 1000 units CAPS Take 2,000 Units by mouth daily.    ? ivabradine (CORLANOR) 5 MG TABS tablet Take 1  tablet (5 mg total) by mouth 2 (two) times daily with a meal. 60 tablet 3  ? losartan (COZAAR) 25 MG tablet TAKE 1 TABLET BY MOUTH TWO TIMES DAILY 60 tablet 11  ? Magnesium 250 MG TABS Take 250 mg by mouth daily.    ? mexiletine (MEXITIL) 150 MG capsule TAKE 1 CAPSULE BY MOUTH 2 TIMES DAILY. 60 capsule 6  ? Probiotic Product (PROBIOTIC DIGESTIVE SUPP PO) Take by mouth daily.    ? rosuvastatin (CRESTOR) 10 MG tablet Take 1 tablet (10 mg total) by mouth at bedtime. 90  tablet 1  ? spironolactone (ALDACTONE) 25 MG tablet TAKE 1/2 TABLET BY MOUTH DAILY 45 tablet 3  ? zinc gluconate 50 MG tablet Take 50 mg by mouth daily.    ? ?No current facility-administered medications for this encounter.  ? ?Allergies  ?Allergen Reactions  ? Effexor [Venlafaxine] Other (See Comments)  ?  Caused depression, negative thoughts, and crying   ? Hydrocodone-Acetaminophen Itching and Nausea And Vomiting  ?  Patient can tolerate with Zofran  ?  ?Social History  ? ?Socioeconomic History  ? Marital status: Married  ?  Spouse name: Not on file  ? Number of children: 2  ? Years of education: Not on file  ? Highest education level: Not on file  ?Occupational History  ? Occupation: Retail banker  ?  Employer: [employer]  ?  Comment: retired June 2019  ?Tobacco Use  ? Smoking status: Former  ?  Packs/day: 2.00  ?  Years: 10.00  ?  Pack years: 20.00  ?  Types: Cigarettes  ?  Quit date: 07/09/1989  ?  Years since quitting: 32.3  ? Smokeless tobacco: Never  ?Vaping Use  ? Vaping Use: Never used  ?Substance and Sexual Activity  ? Alcohol use: No  ? Drug use: No  ? Sexual activity: Yes  ?Other Topics Concern  ? Not on file  ?Social History Narrative  ? Patient with caregiver stress due to spouses declining health  ? Some conflicts with children and addictions (coping well)   ? ?Social Determinants of Health  ? ?Financial Resource Strain: Not on file  ?Food Insecurity: Not on file  ?Transportation Needs: Not on file  ?Physical Activity: Not on file  ?Stress: Not on file  ?Social Connections: Not on file  ?Intimate Partner Violence: Not on file  ? ?Family History  ?Problem Relation Age of Onset  ? Heart disease Mother   ? Hypertension Father   ? Breast cancer Sister   ? Lung cancer Sister   ? Skin cancer Brother   ? ?Vitals:  ? 11/16/21 1358  ?BP: 120/78  ?Pulse: 68  ?SpO2: 100%  ?Weight: 65.7 kg (144 lb 12.8 oz)  ? ? ?Wt Readings from Last 3 Encounters:  ?11/16/21 65.7 kg (144 lb 12.8 oz)  ?11/06/21 66.6 kg (146 lb 12.8  oz)  ?09/29/21 65.8 kg (145 lb)  ?  ?PHYSICAL EXAM: ?General:  Well appearing. No resp difficulty ?HEENT: normal ?Neck: supple. no JVD. Carotids 2+ bilat; no bruits. No lymphadenopathy or thryomegaly appreciated. ?Cor: PMI nondisplaced. Regular rate & rhythm. No rubs, gallops or murmurs. ?Lungs: clear ?Abdomen: soft, nontender, nondistended. No hepatosplenomegaly. No bruits or masses. Good bowel sounds. ?Extremities: no cyanosis, clubbing, rash, edema ?Neuro: alert & orientedx3, cranial nerves grossly intact. moves all 4 extremities w/o difficulty. Affect pleasant ? ? ? ?ASSESSMENT & PLAN: ? ?1. Chronic systolic CHF: NICM, EF 49-44%, felt to be related to doxorubicin therapy. But also  with frequent PVC's, holter monitor showed 1% PVC's but patient complained of very frequent PVC's.  ?- Cath 8/18 mild non-obs CAD. LAD 20%, OM-1 60% RCA 20% ?- Echo 05/2017 ~30-35%. LV size ?- Echo 03/09/18 EF 45-50% ?- CPX 01/2018 Peak VO2: 21.5 (108% predicted peak VO2) VE/VCO2 slope:  48 Peak RER: 0.93 ?- Echo 10/20 EF 40-45% mild to moderate AS ?- Echo 8/21 EF 50-55% ?- Echo  08/21/21: EF 45-50% (I thought EF 40-45%) with LBBB dyssynchrony. Mild-mod AS mean gradient 13 AVA 1.01 cm2  ?- Worse today NYHA II-III. Suspect may be a component of deconditioning with recent knee surgery ?- Volume status ok. Not using lasix. Will confirm with ReDS -> 26% ?- Continue Spiro 12.5 mg daily. Did not tolerate higher dose. ?- Continue losartan 25 mg bid ?- Continue carvedilol to 3.125 mg BID (couldn't tolerate 6.25 bid) ?- Restart Jardiance 10 ?- Continue ivabradine ?- We discussed possible CR but she refuses. Will do regular exercise program if getting worse can repeat CPX. Will also repeat echo in several months to follow AS. Check BNP.  ? ?2. Palpitations/Frequent PVCs ?- Holter monitor 03/2017 with PVC burden <1%. ?- Zio 9/21 PVCs 1.3% (monomorphic) ?- PVCs suppressed with mexilitene 150 bid.Of amio ?- Stable no PVCs on ECG ?  ?3. Mitral  Regurgitation ?- Mild on cMRI 07/07/2017 and echo ?- stable ? ?4. Pulmonary nodule ?- Stable on f/u CT 11/18. No change.  ? ?5. Aortic stenosis ?- echo 10/20 AoV mean 15 AVA 0.99 (mild to moderate AS) ?- echo 8/21 Mild to

## 2021-11-16 NOTE — Progress Notes (Signed)
ReDS Vest / Clip - 11/16/21 1400   ? ?  ? ReDS Vest / Clip  ? Station Marker A   ? Ruler Value 26.5   ? ReDS Value Range Low volume   ? ReDS Actual Value 26   ? ?  ?  ? ?  ? ? ?

## 2021-11-16 NOTE — Patient Instructions (Addendum)
Good to see you today! ? ?Start Jardiance 10 mg daily ? ?Labs done today, your results will be available in MyChart, we will contact you for abnormal readings. ? ?Your physician recommends that you schedule a follow-up appointment in: 2-3 months ? ?If you have any questions or concerns before your next appointment please send [place] a message through [place] or call our office at [phone].   ? ?TO LEAVE A MESSAGE FOR THE NURSE SELECT OPTION 2, PLEASE LEAVE A MESSAGE INCLUDING: ?YOUR NAME ?DATE OF BIRTH ?CALL BACK NUMBER ?REASON FOR CALL**this is important as we prioritize the call backs ? ?YOU WILL RECEIVE A CALL BACK THE SAME DAY AS LONG AS YOU CALL BEFORE 4:00 PM ? ?At the [facility], you and your health needs are our priority. As part of our continuing mission to provide you with exceptional heart care, we have created designated Provider Care Teams. These Care Teams include your primary Cardiologist (physician) and Advanced Practice Providers (APPs- Physician Assistants and Nurse Practitioners) who all work together to provide you with the care you need, when you need it.  ? ?You may see any of the following providers on your designated Care Team at your next follow up: ?Dr Glori Bickers ?Dr Loralie Champagne ?Darrick Grinder, NP ?Lyda Jester, PA ?Jessica Milford,NP ?Marlyce Huge, PA ?Audry Riles, PharmD ? ? ?Please be sure to bring in all your medications bottles to every appointment.  ? ? ?

## 2021-11-17 ENCOUNTER — Other Ambulatory Visit (HOSPITAL_COMMUNITY): Payer: Self-pay | Admitting: Internal Medicine

## 2021-11-17 ENCOUNTER — Other Ambulatory Visit (HOSPITAL_COMMUNITY): Payer: Self-pay

## 2021-11-17 MED ORDER — MEXILETINE HCL 150 MG PO CAPS
ORAL_CAPSULE | Freq: Two times a day (BID) | ORAL | 3 refills | Status: DC
  Filled 2021-11-17: qty 180, 90d supply, fill #0
  Filled 2022-02-18: qty 180, 90d supply, fill #1
  Filled 2022-05-25: qty 180, 90d supply, fill #2
  Filled 2022-08-22: qty 180, 90d supply, fill #3

## 2021-11-20 ENCOUNTER — Other Ambulatory Visit (HOSPITAL_COMMUNITY): Payer: Self-pay

## 2021-11-24 ENCOUNTER — Encounter (INDEPENDENT_AMBULATORY_CARE_PROVIDER_SITE_OTHER): Payer: PPO | Admitting: Family Medicine

## 2021-11-24 DIAGNOSIS — K5792 Diverticulitis of intestine, part unspecified, without perforation or abscess without bleeding: Secondary | ICD-10-CM | POA: Diagnosis not present

## 2021-11-24 MED ORDER — AMOXICILLIN-POT CLAVULANATE 875-125 MG PO TABS: 1.0000 | ORAL_TABLET | Freq: Two times a day (BID) | ORAL | 0 refills | Status: DC

## 2021-11-24 MED ORDER — METRONIDAZOLE 500 MG PO TABS
500.0000 mg | ORAL_TABLET | Freq: Two times a day (BID) | ORAL | 0 refills | Status: AC
Start: 2021-11-24 — End: 2021-12-04

## 2021-11-24 NOTE — Telephone Encounter (Signed)
Cumulative Time: 8 minutes Consent: Pt reached out via MyChart and agreed to proceed People Involved: pt, myself CC: possible recurrent diverticulitis HPI: pt was seen on 5/1 w/ likely resolving/smoldering diverticulitis.  Encouraged her to f/u w/ GI and was able to see a PA and schedule a colonoscopy for August.  In the meantime, she developed recurrent LLQ pain.  No fever, but not feeling well.  Reviewed colonoscopy from 2013 that confirmed diverticula. AP: likely recurrent diverticulitis.  Will start Augmentin and Flagyl (holding off on Cipro due to age and risk of tendon rupture).  Prescription sent to [facility] where pt is currently so she can start meds tonight.  Encouraged her to reach out to GI to notify them of situation.

## 2021-12-13 ENCOUNTER — Ambulatory Visit (INDEPENDENT_AMBULATORY_CARE_PROVIDER_SITE_OTHER): Payer: PPO | Admitting: Family Medicine

## 2021-12-13 ENCOUNTER — Encounter: Payer: Self-pay | Admitting: Family Medicine

## 2021-12-13 VITALS — BP 126/76 | HR 78 | Temp 98.8°F | Resp 16 | Ht 63.5 in | Wt 147.0 lb

## 2021-12-13 DIAGNOSIS — E785 Hyperlipidemia, unspecified: Secondary | ICD-10-CM

## 2021-12-13 DIAGNOSIS — Z0000 Encounter for general adult medical examination without abnormal findings: Secondary | ICD-10-CM

## 2021-12-13 LAB — CBC WITH DIFFERENTIAL/PLATELET
Basophils Absolute: 0 10*3/uL (ref 0.0–0.1)
Basophils Relative: 0.7 % (ref 0.0–3.0)
Eosinophils Absolute: 0.2 10*3/uL (ref 0.0–0.7)
Eosinophils Relative: 3.6 % (ref 0.0–5.0)
HCT: 44.8 % (ref 36.0–46.0)
Hemoglobin: 14.9 g/dL (ref 12.0–15.0)
Lymphocytes Relative: 21.2 % (ref 12.0–46.0)
Lymphs Abs: 1.1 10*3/uL (ref 0.7–4.0)
MCHC: 33.3 g/dL (ref 30.0–36.0)
MCV: 90.5 fl (ref 78.0–100.0)
Monocytes Absolute: 0.5 10*3/uL (ref 0.1–1.0)
Monocytes Relative: 9.6 % (ref 3.0–12.0)
Neutro Abs: 3.4 10*3/uL (ref 1.4–7.7)
Neutrophils Relative %: 64.9 % (ref 43.0–77.0)
Platelets: 258 10*3/uL (ref 150.0–400.0)
RBC: 4.95 Mil/uL (ref 3.87–5.11)
RDW: 15.1 % (ref 11.5–15.5)
WBC: 5.2 10*3/uL (ref 4.0–10.5)

## 2021-12-13 LAB — HEPATIC FUNCTION PANEL
ALT: 16 U/L (ref 0–35)
AST: 22 U/L (ref 0–37)
Albumin: 4.4 g/dL (ref 3.5–5.2)
Alkaline Phosphatase: 119 U/L — ABNORMAL HIGH (ref 39–117)
Bilirubin, Direct: 0.1 mg/dL (ref 0.0–0.3)
Total Bilirubin: 0.4 mg/dL (ref 0.2–1.2)
Total Protein: 7.1 g/dL (ref 6.0–8.3)

## 2021-12-13 LAB — BASIC METABOLIC PANEL
BUN: 17 mg/dL (ref 6–23)
CO2: 26 mEq/L (ref 19–32)
Calcium: 9.9 mg/dL (ref 8.4–10.5)
Chloride: 104 mEq/L (ref 96–112)
Creatinine, Ser: 0.73 mg/dL (ref 0.40–1.20)
GFR: 81.55 mL/min (ref >60.00)
Glucose, Bld: 100 mg/dL — ABNORMAL HIGH (ref 70–99)
Potassium: 4.6 mEq/L (ref 3.5–5.1)
Sodium: 137 mEq/L (ref 135–145)

## 2021-12-13 LAB — LIPID PANEL
Cholesterol: 168 mg/dL (ref 0–200)
HDL: 65.9 mg/dL (ref >39.00)
LDL Cholesterol: 90 mg/dL (ref 0–99)
NonHDL: 102.56
Total CHOL/HDL Ratio: 3
Triglycerides: 64 mg/dL (ref 0.0–149.0)
VLDL: 12.8 mg/dL (ref 0.0–40.0)

## 2021-12-13 LAB — TSH: TSH: 2.67 u[IU]/mL (ref 0.35–5.50)

## 2021-12-13 NOTE — Progress Notes (Signed)
   Subjective:    Patient ID: Patricia Collier, female    DOB: 1946/04/22, 76 y.o.   MRN: [id number]  HPI CPE- UTD on mammo, PNA.  Colonoscopy scheduled for August.  Patient Care Team    Relationship Specialty Notifications Start End  [name], MD PCP - General Family Medicine  05/28/12    Comment: Alfonse Flavors, Elaina Hoops, MD Consulting Physician Obstetrics and Gynecology  01/03/17   Bensimhon, Shaune Pascal, MD Consulting Physician Cardiology  05/27/19   Ulla Gallo, MD Consulting Physician Dermatology  05/27/19   Madelin Rear, [facility] Pharmacist Pharmacist  10/14/19    Comment: phone number [phone]    Health Maintenance  Topic Date Due   Zoster Vaccines- Shingrix (1 of 2) 02/06/2022 (Originally 06/22/1998)   COLONOSCOPY (Pts 45-15yr Insurance coverage will need to be confirmed)  11/07/2022 (Originally 07/17/2021)   TETANUS/TDAP  11/07/2022 (Originally 06/23/1967)   INFLUENZA VACCINE  02/06/2022   MAMMOGRAM  03/18/2023   Pneumonia Vaccine 76 Years old  Completed   DEXA SCAN  Completed   HPV VACCINES  Aged Out   COVID-19 Vaccine  Discontinued   Hepatitis C Screening  Discontinued      Review of Systems Patient reports no vision/ hearing changes, adenopathy,fever, weight change,  persistant/recurrent hoarseness , swallowing issues, chest pain, palpitations, edema, persistant/recurrent cough, hemoptysis, dyspnea (rest/exertional/paroxysmal nocturnal), gastrointestinal bleeding (melena, rectal bleeding), significant heartburn, bowel changes, GU symptoms (dysuria, hematuria, incontinence), Gyn symptoms (abnormal  bleeding, pain),  syncope, focal weakness, memory loss, numbness & tingling, skin/hair/nail changes, abnormal bruising or bleeding, anxiety, or depression.     Objective:   Physical Exam General Appearance:    Alert, cooperative, no distress, appears stated age  Head:    Normocephalic, without obvious abnormality, atraumatic  Eyes:    PERRL, conjunctiva/corneas  clear, EOM's intact both eyes  Ears:    Normal TM's and external ear canals, both ears  Nose:   Nares normal, septum midline, mucosa normal, no drainage    or sinus tenderness  Throat:   Lips, mucosa, and tongue normal; teeth and gums normal  Neck:   Supple, symmetrical, trachea midline, no adenopathy;    Thyroid: no enlargement/tenderness/nodules  Back:     Symmetric, no curvature, ROM normal, no CVA tenderness  Lungs:     Clear to auscultation bilaterally, respirations unlabored  Chest Wall:    No tenderness or deformity   Heart:    Regular rate and rhythm, S1 and S2 normal, no murmur, rub   or gallop  Breast Exam:    Deferred to mammo  Abdomen:     Soft, non-tender, bowel sounds active all four quadrants,    no masses, no organomegaly  Genitalia:    Deferred to GYN  Rectal:    Extremities:   Extremities normal, atraumatic, no cyanosis or edema  Pulses:   2+ and symmetric all extremities  Skin:   Skin color, texture, turgor normal, no rashes or lesions  Lymph nodes:   Cervical, supraclavicular, and axillary nodes normal  Neurologic:   CNII-XII intact, normal strength, sensation and reflexes    throughout          Assessment & Plan:

## 2021-12-13 NOTE — Assessment & Plan Note (Signed)
Pt's PE WNL.  UTD on mammo, PNA vaccines.  Colonoscopy scheduled for August.  Check labs.  Anticipatory guidance provided.

## 2021-12-13 NOTE — Patient Instructions (Signed)
Follow up in 6 months to recheck cholesterol We'll notify you of your lab results and make any changes if needed Keep up the good work!  You look fabulous!!! Call with any questions or concerns [place]!!!

## 2021-12-13 NOTE — Assessment & Plan Note (Signed)
Chronic problem.  Check labs and adjust meds prn. 

## 2021-12-14 ENCOUNTER — Telehealth: Payer: Self-pay

## 2021-12-14 NOTE — Telephone Encounter (Signed)
-----   Message from [name], MD sent at 12/13/2021  9:11 PM EDT ----- Labs look great!  No changes at this time

## 2021-12-14 NOTE — Telephone Encounter (Signed)
Spoke w/ pt advised of lab results  ?

## 2021-12-25 ENCOUNTER — Other Ambulatory Visit (HOSPITAL_COMMUNITY): Payer: Self-pay

## 2022-01-01 ENCOUNTER — Other Ambulatory Visit (HOSPITAL_COMMUNITY): Payer: Self-pay

## 2022-01-01 MED ORDER — TRAMADOL HCL 50 MG PO TABS
ORAL_TABLET | ORAL | 0 refills | Status: DC
  Filled 2022-01-01: qty 14, 3d supply, fill #0

## 2022-01-01 MED ORDER — PENICILLIN V POTASSIUM 500 MG PO TABS
ORAL_TABLET | ORAL | 0 refills | Status: DC
  Filled 2022-01-01: qty 28, 7d supply, fill #0

## 2022-01-23 ENCOUNTER — Other Ambulatory Visit: Payer: Self-pay | Admitting: Family Medicine

## 2022-01-23 ENCOUNTER — Other Ambulatory Visit (HOSPITAL_COMMUNITY): Payer: Self-pay | Admitting: Internal Medicine

## 2022-01-23 ENCOUNTER — Other Ambulatory Visit (HOSPITAL_COMMUNITY): Payer: Self-pay

## 2022-01-23 MED ORDER — IVABRADINE HCL 5 MG PO TABS
5.0000 mg | ORAL_TABLET | Freq: Two times a day (BID) | ORAL | 3 refills | Status: DC
Start: 2022-01-23 — End: 2022-06-12
  Filled 2022-01-23: qty 60, 30d supply, fill #0
  Filled 2022-03-04: qty 60, 30d supply, fill #1
  Filled 2022-03-31: qty 60, 30d supply, fill #2
  Filled 2022-05-09: qty 60, 30d supply, fill #3

## 2022-01-24 ENCOUNTER — Other Ambulatory Visit (HOSPITAL_COMMUNITY): Payer: Self-pay

## 2022-01-24 MED ORDER — SCOPOLAMINE 1 MG/3DAYS TD PT72
1.0000 | MEDICATED_PATCH | TRANSDERMAL | 1 refills | Status: DC
Start: 2022-01-24 — End: 2022-10-09
  Filled 2022-01-24: qty 10, 30d supply, fill #0
  Filled 2022-05-25: qty 10, 30d supply, fill #1

## 2022-01-24 MED ORDER — SUTAB 1479-225-188 MG PO TABS
ORAL_TABLET | ORAL | 0 refills | Status: DC
  Filled 2022-01-24: qty 24, 12d supply, fill #0

## 2022-01-26 ENCOUNTER — Other Ambulatory Visit (HOSPITAL_COMMUNITY): Payer: Self-pay

## 2022-01-26 MED ORDER — SUTAB 1479-225-188 MG PO TABS
ORAL_TABLET | ORAL | 0 refills | Status: DC
  Filled 2022-01-26: qty 24, 1d supply, fill #0

## 2022-01-30 ENCOUNTER — Other Ambulatory Visit (HOSPITAL_COMMUNITY): Payer: Self-pay

## 2022-01-31 ENCOUNTER — Other Ambulatory Visit (HOSPITAL_COMMUNITY): Payer: Self-pay

## 2022-01-31 MED ORDER — PEG 3350-KCL-NA BICARB-NACL 420 G PO SOLR
ORAL | 0 refills | Status: DC
  Filled 2022-01-31: qty 4000, 1d supply, fill #0

## 2022-01-31 MED ORDER — BISACODYL 5 MG PO TBEC: DELAYED_RELEASE_TABLET | ORAL | 0 refills | Status: DC

## 2022-02-05 ENCOUNTER — Other Ambulatory Visit (HOSPITAL_COMMUNITY): Payer: Self-pay

## 2022-02-07 DIAGNOSIS — K921 Melena: Secondary | ICD-10-CM | POA: Diagnosis not present

## 2022-02-07 DIAGNOSIS — K5730 Diverticulosis of large intestine without perforation or abscess without bleeding: Secondary | ICD-10-CM | POA: Diagnosis not present

## 2022-02-07 DIAGNOSIS — K649 Unspecified hemorrhoids: Secondary | ICD-10-CM | POA: Diagnosis not present

## 2022-02-07 DIAGNOSIS — K5732 Diverticulitis of large intestine without perforation or abscess without bleeding: Secondary | ICD-10-CM | POA: Diagnosis not present

## 2022-02-15 ENCOUNTER — Other Ambulatory Visit: Payer: Self-pay | Admitting: Family Medicine

## 2022-02-15 DIAGNOSIS — Z1231 Encounter for screening mammogram for malignant neoplasm of breast: Secondary | ICD-10-CM

## 2022-02-18 ENCOUNTER — Other Ambulatory Visit (HOSPITAL_COMMUNITY): Payer: Self-pay | Admitting: Internal Medicine

## 2022-02-19 ENCOUNTER — Other Ambulatory Visit (HOSPITAL_COMMUNITY): Payer: Self-pay

## 2022-02-19 MED ORDER — CARVEDILOL 3.125 MG PO TABS
3.1250 mg | ORAL_TABLET | Freq: Two times a day (BID) | ORAL | 3 refills | Status: DC
  Filled 2022-02-19: qty 60, 30d supply, fill #0
  Filled 2022-03-24: qty 60, 30d supply, fill #1
  Filled 2022-04-24: qty 60, 30d supply, fill #2
  Filled 2022-05-25: qty 60, 30d supply, fill #3

## 2022-02-20 ENCOUNTER — Other Ambulatory Visit (HOSPITAL_COMMUNITY): Payer: Self-pay

## 2022-02-22 ENCOUNTER — Ambulatory Visit (HOSPITAL_COMMUNITY)
Admission: RE | Admit: 2022-02-22 | Discharge: 2022-02-22 | Disposition: A | Discharge status: Home / Self Care | Payer: PPO | Source: Ambulatory Visit | Attending: Internal Medicine | Admitting: Internal Medicine

## 2022-02-22 VITALS — BP 104/68 | HR 70 | Wt 148.0 lb

## 2022-02-22 DIAGNOSIS — I340 Nonrheumatic mitral (valve) insufficiency: Secondary | ICD-10-CM | POA: Insufficient documentation

## 2022-02-22 DIAGNOSIS — R911 Solitary pulmonary nodule: Secondary | ICD-10-CM | POA: Diagnosis not present

## 2022-02-22 DIAGNOSIS — R002 Palpitations: Secondary | ICD-10-CM | POA: Diagnosis not present

## 2022-02-22 DIAGNOSIS — I493 Ventricular premature depolarization: Secondary | ICD-10-CM

## 2022-02-22 DIAGNOSIS — Z87891 Personal history of nicotine dependence: Secondary | ICD-10-CM | POA: Insufficient documentation

## 2022-02-22 DIAGNOSIS — Z79899 Other long term (current) drug therapy: Secondary | ICD-10-CM | POA: Diagnosis not present

## 2022-02-22 DIAGNOSIS — I110 Hypertensive heart disease with heart failure: Secondary | ICD-10-CM | POA: Insufficient documentation

## 2022-02-22 DIAGNOSIS — Z9221 Personal history of antineoplastic chemotherapy: Secondary | ICD-10-CM | POA: Insufficient documentation

## 2022-02-22 DIAGNOSIS — I2510 Atherosclerotic heart disease of native coronary artery without angina pectoris: Secondary | ICD-10-CM | POA: Insufficient documentation

## 2022-02-22 DIAGNOSIS — I350 Nonrheumatic aortic (valve) stenosis: Secondary | ICD-10-CM | POA: Diagnosis not present

## 2022-02-22 DIAGNOSIS — Z853 Personal history of malignant neoplasm of breast: Secondary | ICD-10-CM | POA: Insufficient documentation

## 2022-02-22 DIAGNOSIS — I447 Left bundle-branch block, unspecified: Secondary | ICD-10-CM | POA: Diagnosis not present

## 2022-02-22 DIAGNOSIS — Z636 Dependent relative needing care at home: Secondary | ICD-10-CM | POA: Insufficient documentation

## 2022-02-22 DIAGNOSIS — Z923 Personal history of irradiation: Secondary | ICD-10-CM | POA: Diagnosis not present

## 2022-02-22 DIAGNOSIS — Z7984 Long term (current) use of oral hypoglycemic drugs: Secondary | ICD-10-CM | POA: Diagnosis not present

## 2022-02-22 DIAGNOSIS — E785 Hyperlipidemia, unspecified: Secondary | ICD-10-CM | POA: Diagnosis not present

## 2022-02-22 DIAGNOSIS — I5022 Chronic systolic (congestive) heart failure: Secondary | ICD-10-CM | POA: Insufficient documentation

## 2022-02-22 NOTE — Progress Notes (Signed)
Advanced Heart Failure Clinic Note   Primary Cardiologist: Dr. 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 T1No Grade III invasive ductal carcinoma and received dose-dense doxorubicin in 2009 and cyclophosphamide x4, paclitaxel x12  Admitted 7/18 with new onset acute systolic CHF. EF 15-20%. Right and left heart cath done and showed  moderate non-obstructive CAD Fick output/index 3.1/1.8.  Holter monitor 8/18: Multiform PVCs, couplets, and triplets were noted with periods of bigeminy. PVC burden < 1%.No VT observed.  Echo 9/19: EF 45-50% moderate AS mean gradient 49mHG Echo 8/21: EF 50-55% mild to moderate AS mean 130mG  Zio 11/21: SR 72. 1.3% PVCs. . [facility] Kitchen Echo  08/21/21: EF 45-50% (EF 40-45%) with LBBB dyssynchrony. Mild-mod AS mean gradient 13 AVA 1.01 cm2   Here for routine f/u. Feels ok. A bit better than last time. Struggles with stairs and the heat. Otherwise can do anything she wants to do. Playing golf regularly (rides cart). Plays with grandkids. Edema well controlled. 4645/o daughter in rehab. Son looking for a job. Husband requires care.    Cardiac studies:   Zio 11/21 1. Sinus rhythm - avg HR of 72 2. Isolated PACs were rare 3. Occasional PVCs  (1.3%, 17991) 4. No high-grade arrhythmias 5. Patient triggered events associated with isolated PVCs.    CPX 01/2018 Peak VO2: 21.5 (108% predicted peak VO2) VE/VCO2 slope:  48 OUES: 1.84 Peak RER: 0.93  Echo 05/09/17 EF ~30% RV ok. Personally reviewed  Echo 02/05/17 LVEF 15-20%, Grade 1 DD, Mild/Mod central MR, Mild LAE  R/LHC 02/06/17 There is severe left ventricular systolic dysfunction. Dist Cx lesion, 20 %stenosed. 1st Mrg lesion, 60 %stenosed. Dist RCA lesion, 20 %stenosed. Mid LAD to Dist LAD lesion, 20 %stenosed.   Cardiac MRI  06/2017 1.  Moderate LVE with diffuse hypokinesis EF 35% 2. Small area of apical hyperenhancement on delayed gadolinium images 3.  Mild MR 4.  Mild LAE 5. Thickened tri leaflet AV with restricted leaflet motion suggest echo for AS correlation 6.  Normal RV size and function 7.  Normal aortic root 3.0 cm  Review of systems complete and found to be negative unless listed in HPI.   Past Medical History:  Diagnosis Date   Arthritis    ostearthritis -joints, greater left knee.   [facility]   [facility]   S/P surgery, radiation, chemotherapy.-no further oncology visits.   [place] (congestive heart [place]) [place] [place]   Hyperlipidemia    Hypertension    Neuromuscular disorder ([facility]   neuropathy due to chemotherapy   Personal history of chemotherapy 2009   Personal history of radiation therapy 2009   Seizures ([facility]   "childbirth related"   Sleep apnea    mild no C-PAP   SVT (supraventricular tachycardia) (HCC)    x1 episode -3 yrs ago- no issues since.All heart testing negative. No further cardiac visits needed.   Current Outpatient Medications  Medication Sig Dispense Refill   carvedilol (COREG) 3.125 MG tablet Take 1 tablet (3.125 mg total) by mouth 2 (two) times daily. 60 tablet 3   Coenzyme Q10 (COQ10) 100 MG CAPS      empagliflozin (JARDIANCE) 10 MG TABS tablet Take 1 tablet (10 mg total) by mouth daily before breakfast. 90 tablet  1   ivabradine (CORLANOR) 5 MG TABS tablet Take 1 tablet (5 mg total) by mouth 2 (two) times daily with a meal. 60 tablet 3   losartan (COZAAR) 25 MG tablet TAKE 1 TABLET BY MOUTH TWO TIMES DAILY 60 tablet 11   Magnesium 250 MG TABS Take 250 mg by mouth daily.     mexiletine (MEXITIL) 150 MG capsule TAKE 1 CAPSULE BY MOUTH 2 TIMES DAILY. 180 capsule 3   rosuvastatin (CRESTOR) 10 MG tablet Take 1 tablet by mouth at bedtime. 90 tablet 1   scopolamine (TRANSDERM-SCOP) 1 MG/3DAYS Place 1 patch onto the skin  every 3 days. 10 patch 1   spironolactone (ALDACTONE) 25 MG tablet TAKE 1/2 TABLET BY MOUTH DAILY 45 tablet 3   No current facility-administered medications for this encounter.   Allergies  Allergen Reactions   Effexor [Venlafaxine] Other (See Comments)    Caused depression, negative thoughts, and crying    Hydrocodone-Acetaminophen Itching and Nausea And Vomiting    Patient can tolerate with Zofran    Social History   Socioeconomic History   Marital status: Married    Spouse name: Not on file   Number of children: 2   Years of education: Not on file   Highest education level: Not on file  Occupational History   Occupation: anesthetist    Employer: [employer]: retired June 2019  Tobacco Use   Smoking status: Former    Packs/day: 2.00    Years: 10.00    Total pack years: 20.00    Types: Cigarettes    Quit date: 07/09/1989    Years since quitting: 32.6   Smokeless tobacco: Never  Vaping Use   Vaping Use: Never used  Substance and Sexual Activity   Alcohol use: No   Drug use: No   Sexual activity: Yes  Other Topics Concern   Not on file  Social History Narrative   Patient with caregiver stress due to spouses declining health   Some conflicts with children and addictions (coping well)    Social Determinants of Health   Financial Resource Strain: Low Risk  (06/20/2020)   Overall Financial Resource Strain (CARDIA)    Difficulty of Paying Living Expenses: Not hard at all  Food Insecurity: No Food Insecurity (07/29/2020)   Hunger Vital Sign    Worried About Running Out of Food in the Last Year: Never true    [place] in the Last Year: Never true  Transportation Needs: No Transportation Needs (11/02/2019)   PRAPARE - Hydrologist (Medical): No    Lack of Transportation (Non-Medical): No  Physical Activity: Sufficiently Active (06/20/2020)   Exercise Vital Sign    Days of Exercise per Week: 7 days    Minutes of Exercise per  Session: 60 min  Stress: No Stress Concern Present (06/20/2020)   [place]    Feeling of Stress : Only a little  Social Connections: Moderately Integrated (06/20/2020)   Social Connection and Isolation Panel [NHANES]    Frequency of Communication with Friends and Family: More than three times a week    Frequency of Social Gatherings with Friends and Family: More than three times a week    Attends Religious Services: Never    Marine scientist or Organizations: Yes    Attends Music therapist: More than 4 times per year    Marital Status: Married  Intimate Partner Violence: Not At Risk (06/20/2020)   Humiliation, Afraid, Rape, and Kick questionnaire    Fear of Current or Ex-Partner: No    Emotionally Abused: No    Physically Abused: No    Sexually Abused: No   Family History  Problem Relation Age of Onset   Heart disease Mother    Hypertension Father    Breast cancer Sister    Lung cancer Sister    Skin cancer Brother    Vitals:   02/22/22 1128  BP: 104/68  Pulse: 70  SpO2: 99%  Weight: 67.1 kg (148 lb)     Wt Readings from Last 3 Encounters:  02/22/22 67.1 kg (148 lb)  12/13/21 66.7 kg (147 lb)  11/16/21 65.7 kg (144 lb 12.8 oz)    PHYSICAL EXAM: General:  Well appearing. No resp difficulty HEENT: normal Neck: supple. no JVD. Carotids 2+ bilat; + bruits. No lymphadenopathy or thryomegaly appreciated. Cor: PMI nondisplaced. Regular rate & rhythm. 2/6 SEM RSB Lungs: clear Abdomen: soft, nontender, nondistended. No hepatosplenomegaly. No bruits or masses. Good bowel sounds. Extremities: no cyanosis, clubbing, rash, edema Neuro: alert & orientedx3, cranial nerves grossly intact. moves all 4 extremities w/o difficulty. Affect pleasant   ASSESSMENT & PLAN:  1. Chronic systolic CHF: NICM, EF 35-00%, felt to be related to doxorubicin therapy. But also with frequent PVC's, holter monitor  showed 1% PVC's but patient complained of very frequent PVC's.  - Cath 8/18 mild non-obs CAD. LAD 20%, OM-1 60% RCA 20% - Echo 05/2017 ~30-35%. LV size - Echo 03/09/18 EF 45-50% - CPX 01/2018 Peak VO2: 21.5 (108% predicted peak VO2) VE/VCO2 slope:  48 Peak RER: 0.93 - Echo 10/20 EF 40-45% mild to moderate AS - Echo 8/21 EF 50-55% - Echo  08/21/21: EF 45-50% (I thought EF 40-45%) with LBBB dyssynchrony. Mild-mod AS mean gradient 13 AVA 1.01 cm2  - Stable NYHA II-III. Suspect may be a component of deconditioning with recent knee surgery - Volume status ok. Not using lasix. ReDS at last visit 26% - Continue Spiro 12.5 mg daily. Did not tolerate higher dose. - Continue losartan 25 mg bid - Continue carvedilol to 3.125 mg BID (couldn't tolerate 6.25 bid) - Continue Jardiance 10 (costs $414 for 3 months) -> will ask PharmD to help - Continue ivabradine   2. Palpitations/Frequent PVCs - Holter monitor 03/2017 with PVC burden <1%. - Zio 9/21 PVCs 1.3% (monomorphic) - PVCs suppressed with mexilitene 150 bid.Of amio - Stable no PVCs on ECG - No change  3. Mitral Regurgitation - Mild on cMRI 07/07/2017 and echo - stable  4. Pulmonary nodule - Stable on f/u CT 11/18. No change.   5. Aortic stenosis - echo 10/20 AoV mean 15 AVA 0.99 (mild to moderate AS) - echo 8/21 Mild to moderate aortic valve stenosis. AVA 1.48 cm. Aortic valve mean gradient measures 18 mmHg. - Echo  08/21/21: EF 45-50% (I thought EF 40-45%) with LBBB dyssynchrony. Mild-mod AS mean gradient 13 AVA 1.01 cm2  - remains mild to moderate on echo and exam - follow closely - Repeat echo next visit  6. Hyperlipidemia - now on Crestor   Glori Bickers, MD  12:22 PM

## 2022-02-22 NOTE — Patient Instructions (Signed)
No change in medications. Return to Heart Failure Clinic in 9 months with echo. Please call our office for an appointment 2 months prior to this time.

## 2022-02-22 NOTE — Progress Notes (Signed)
Samples of Jardiance given to patient:  Jardiance 5 mg tablets; 7 tab per bottle; SN [id number]; exp 10/25 - gave 5 bottles  Zada Girt RN

## 2022-02-22 NOTE — Addendum Note (Signed)
Encounter addended by: Lezlie Octave, RN on: 02/22/2022 12:36 PM  Actions taken: Clinical Note Signed

## 2022-02-22 NOTE — Addendum Note (Signed)
Encounter addended by: Lezlie Octave, RN on: 02/22/2022 12:31 PM  Actions taken: Clinical Note Signed

## 2022-02-27 ENCOUNTER — Other Ambulatory Visit (HOSPITAL_COMMUNITY): Payer: Self-pay

## 2022-03-05 ENCOUNTER — Other Ambulatory Visit (HOSPITAL_COMMUNITY): Payer: Self-pay

## 2022-03-19 ENCOUNTER — Ambulatory Visit
Admission: RE | Admit: 2022-03-19 | Discharge: 2022-03-19 | Disposition: A | Discharge status: Home / Self Care | Payer: PPO | Source: Ambulatory Visit | Attending: Family Medicine | Admitting: Family Medicine

## 2022-03-19 DIAGNOSIS — Z1231 Encounter for screening mammogram for malignant neoplasm of breast: Secondary | ICD-10-CM

## 2022-03-20 ENCOUNTER — Telehealth: Payer: Self-pay | Admitting: Pharmacist

## 2022-03-20 NOTE — Progress Notes (Signed)
Chronic Care Management Pharmacy Assistant   Name: Patricia Collier  MRN: [id number] DOB: 10-07-1945   Reason for Encounter: Disease State - Diabetes Call    Recent office visits:  12/13/21 Annye Asa, MD - Family Medicine - Physical - Labs were ordered. Follow up in 6 months.  Recent consult visits:  02/07/22 [facility] - Diverticular disease - No notes available.   11/24/21 Annye Asa, MD - Diverticulitis - No notes available.    Hospital visits: 10/09/21 Medication Reconciliation was completed by comparing discharge summary, patient's EMR and Pharmacy list, and upon discussion with patient.   Admitted to the hospital on 10/09/21 due to Right knee arthroscopy. Discharge date was 10/09/21. Discharged from [facility]?Medications Started at [facility] Discharge:?? aspirin EC methocarbamol (Robaxin) ondansetron (Zofran) oxyCODONE (Roxicodone)   Medication Changes at Hospital Discharge: None noted.    Medications Discontinued at Hospital Discharge: None noted.   Medications that remain the same after Hospital Discharge:??  All other medications will remain the same.     Medications: Outpatient Encounter Medications as of 03/20/2022  Medication Sig   carvedilol (COREG) 3.125 MG tablet Take 1 tablet (3.125 mg total) by mouth 2 (two) times daily.   Coenzyme Q10 (COQ10) 100 MG CAPS    empagliflozin (JARDIANCE) 10 MG TABS tablet Take 1 tablet (10 mg total) by mouth daily before breakfast.   ivabradine (CORLANOR) 5 MG TABS tablet Take 1 tablet (5 mg total) by mouth 2 (two) times daily with a meal.   losartan (COZAAR) 25 MG tablet TAKE 1 TABLET BY MOUTH TWO TIMES DAILY   Magnesium 250 MG TABS Take 250 mg by mouth daily.   mexiletine (MEXITIL) 150 MG capsule TAKE 1 CAPSULE BY MOUTH 2 TIMES DAILY.   rosuvastatin (CRESTOR) 10 MG tablet Take 1 tablet by mouth at bedtime.   scopolamine (TRANSDERM-SCOP) 1 MG/3DAYS Place 1 patch onto the skin every 3  days.   spironolactone (ALDACTONE) 25 MG tablet TAKE 1/2 TABLET BY MOUTH DAILY   No facility-administered encounter medications on file as of 03/20/2022.    Current antihyperglycemic regimen:  Empagliflozin (JARDIANCE) 10 MG TABS tablet  What recent interventions/DTPs have been made to improve glycemic control:  Patient reported no changes in regimen since last CPP visit.   Have there been any recent hospitalizations or ED visits since last visit with CPP?  Patient did have visit on 10/09/21 for Right knee surgery.   Patient denies hypoglycemic symptoms, including None   Patient denies hyperglycemic symptoms, including none   How often are you checking your blood sugar? Patient reported she is not checking her blood sugars and states she is only taking Jardiance for her heart. She is not diabetic.    What are your blood sugars ranging?  Fasting:  Before meals:  After meals:  Bedtime:   During the week, how often does your blood glucose drop below 70?  Patient reported she is not diabetic nor checking her blood sugars.   Are you checking your feet daily/regularly? Patient reported checking feet regularly and denies any current concerns.    Adherence Review: Is the patient currently on a STATIN medication? Yes Is the patient currently on ACE/ARB medication? Yes Does the patient have >5 day gap between last estimated fill dates? No   Care Gaps   AWV: done 06/20/20 Colonoscopy: done 07/18/11 DM Eye Exam: N/A DM Foot Exam: N/A Microalbumin: N/A HbgAIC:  N/A DEXA: done 01/17/04 Mammogram: done 03/21/21  Star Rating Drugs: rosuvastatin (CRESTOR) 10 MG tablet - last filled 12/27/21 90 days  losartan (COZAAR) 25 MG tablet - last filled 02/21/22  90 days  empagliflozin (JARDIANCE) 10 MG TABS tablet - last filled 02/21/22 30 days    No future appointments.   Jobe Gibbon, [facility] Clinical Pharmacist Assistant  [phone]

## 2022-03-24 ENCOUNTER — Other Ambulatory Visit (HOSPITAL_COMMUNITY): Payer: Self-pay | Admitting: Internal Medicine

## 2022-03-24 ENCOUNTER — Other Ambulatory Visit (HOSPITAL_COMMUNITY): Payer: Self-pay

## 2022-03-26 ENCOUNTER — Other Ambulatory Visit (HOSPITAL_COMMUNITY): Payer: Self-pay

## 2022-03-26 MED ORDER — SPIRONOLACTONE 25 MG PO TABS
12.5000 mg | ORAL_TABLET | Freq: Every day | ORAL | 3 refills | Status: DC
  Filled 2022-03-26: qty 45, 90d supply, fill #0
  Filled 2022-06-26: qty 45, 90d supply, fill #1
  Filled 2022-08-23 – 2022-09-19 (×2): qty 45, 90d supply, fill #2
  Filled 2022-12-23: qty 45, 90d supply, fill #3

## 2022-03-27 ENCOUNTER — Telehealth: Payer: Self-pay | Admitting: Family Medicine

## 2022-03-27 NOTE — Telephone Encounter (Signed)
Left message for patient to call back and schedule Medicare Annual Wellness Visit (AWV).   Please offer to do virtually or by telephone.  Left office number and my jabber [phone].  Last AWV:06/20/2020  Please schedule at anytime with Nurse Health Advisor.

## 2022-03-29 ENCOUNTER — Other Ambulatory Visit (HOSPITAL_COMMUNITY): Payer: Self-pay

## 2022-03-31 ENCOUNTER — Other Ambulatory Visit (HOSPITAL_COMMUNITY): Payer: Self-pay

## 2022-04-25 ENCOUNTER — Other Ambulatory Visit (HOSPITAL_COMMUNITY): Payer: Self-pay

## 2022-05-09 ENCOUNTER — Other Ambulatory Visit (HOSPITAL_COMMUNITY): Payer: Self-pay

## 2022-05-15 DIAGNOSIS — H527 Unspecified disorder of refraction: Secondary | ICD-10-CM | POA: Diagnosis not present

## 2022-05-15 DIAGNOSIS — H353131 Nonexudative age-related macular degeneration, bilateral, early dry stage: Secondary | ICD-10-CM | POA: Diagnosis not present

## 2022-05-25 ENCOUNTER — Other Ambulatory Visit (HOSPITAL_COMMUNITY): Payer: Self-pay

## 2022-05-25 ENCOUNTER — Other Ambulatory Visit (HOSPITAL_COMMUNITY): Payer: Self-pay | Admitting: Internal Medicine

## 2022-05-25 MED ORDER — EMPAGLIFLOZIN 10 MG PO TABS
10.0000 mg | ORAL_TABLET | Freq: Every day | ORAL | 1 refills | Status: DC
  Filled 2022-05-25: qty 90, 90d supply, fill #0
  Filled 2022-08-22: qty 90, 90d supply, fill #1

## 2022-05-25 MED ORDER — LOSARTAN POTASSIUM 25 MG PO TABS
25.0000 mg | ORAL_TABLET | Freq: Two times a day (BID) | ORAL | 11 refills | Status: DC
  Filled 2022-05-25: qty 60, 30d supply, fill #0
  Filled 2022-06-26: qty 60, 30d supply, fill #1
  Filled 2022-07-22: qty 60, 30d supply, fill #2
  Filled 2022-08-22: qty 60, 30d supply, fill #3
  Filled 2022-09-19: qty 60, 30d supply, fill #4

## 2022-05-28 ENCOUNTER — Other Ambulatory Visit (HOSPITAL_COMMUNITY): Payer: Self-pay

## 2022-06-06 ENCOUNTER — Ambulatory Visit (INDEPENDENT_AMBULATORY_CARE_PROVIDER_SITE_OTHER): Payer: PPO | Admitting: *Deleted

## 2022-06-06 VITALS — BP 128/84 | Ht 63.0 in | Wt 154.8 lb

## 2022-06-06 DIAGNOSIS — Z0000 Encounter for general adult medical examination without abnormal findings: Secondary | ICD-10-CM

## 2022-06-06 DIAGNOSIS — Z23 Encounter for immunization: Secondary | ICD-10-CM | POA: Diagnosis not present

## 2022-06-06 DIAGNOSIS — L814 Other melanin hyperpigmentation: Secondary | ICD-10-CM | POA: Diagnosis not present

## 2022-06-06 DIAGNOSIS — L821 Other seborrheic keratosis: Secondary | ICD-10-CM | POA: Diagnosis not present

## 2022-06-06 NOTE — Progress Notes (Signed)
Subjective:   Patricia Collier is a 76 y.o. female who presents for Medicare Annual (Subsequent) preventive examination.   Patient location:  in office  Provider location: in office  I provided  45 minutes of non face - to - face time during this encounter.   Review of Systems     Cardiac Risk Factors include: advanced age (>66mn, >>57women);family history of premature cardiovascular disease     Objective:    Today's Vitals   06/06/22 1154  Weight: 154 lb 12.8 oz (70.2 kg)  Height: '5\' 3"'$  (1.6 m)   Body mass index is 27.42 kg/m.     06/06/2022   11:58 AM 09/29/2021    2:14 PM 06/20/2020    9:51 AM 05/27/2019   12:56 PM 05/06/2018    8:14 PM 03/14/2018   11:36 PM 02/05/2017    1:04 PM  Advanced Directives  Does Patient Have a Medical Advance Directive? Yes Yes Yes Yes Yes Yes Yes  Type of AParamedicof [place]ithoniaLiving will HMount CarmelLiving will Living will;Healthcare Power of [place]Living will [place] will [place]Living will  Does patient want to make changes to medical advance directive?    No - Patient declined No - Patient declined No - Patient declined No - Patient declined  Copy of [place] Chart? No - copy requested  No - copy requested No - copy requested No - copy requested No - copy requested No - copy requested    Current Medications (verified) Outpatient Encounter Medications as of 06/06/2022  Medication Sig   carvedilol (COREG) 3.125 MG tablet Take 1 tablet (3.125 mg total) by mouth 2 (two) times daily.   Coenzyme Q10 (COQ10) 100 MG CAPS    empagliflozin (JARDIANCE) 10 MG TABS tablet Take 1 tablet (10 mg total) by mouth daily before breakfast.   ivabradine (CORLANOR) 5 MG TABS tablet Take 1 tablet (5 mg total) by mouth 2 (two) times daily with a meal.   losartan (COZAAR) 25 MG  tablet Take 1 tablet (25 mg total) by mouth 2 (two) times daily.   Magnesium 250 MG TABS Take 250 mg by mouth daily.   mexiletine (MEXITIL) 150 MG capsule TAKE 1 CAPSULE BY MOUTH 2 TIMES DAILY.   rosuvastatin (CRESTOR) 10 MG tablet Take 1 tablet by mouth at bedtime.   scopolamine (TRANSDERM-SCOP) 1 MG/3DAYS Place 1 patch onto the skin every 3 days.   spironolactone (ALDACTONE) 25 MG tablet Take 0.5 tablets (12.5 mg total) by mouth daily.   zinc gluconate 50 MG tablet Take 50 mg by mouth daily.   No facility-administered encounter medications on file as of 06/06/2022.    Allergies (verified) Effexor [venlafaxine] and Hydrocodone-acetaminophen   History: Past Medical History:  Diagnosis Date   Arthritis    ostearthritis -joints, greater left knee.   Breast [facility] ([facility]    Breast cancer, left breast ([place] 2009   S/P surgery, radiation, chemotherapy.-no further oncology visits.   CH[place] (congestive heart failure) ([place] 02/05/2017   Hyperlipidemia    Hypertension    Neuromuscular disorder ([place]    neuropathy due to chemotherapy   Personal history of chemotherapy 2009   Personal history of radiation therapy 2009   Seizures ([facility]    "childbirth related"   Sleep apnea    mild no C-PAP   SVT (supraventricular tachycardia)    x1 episode -3 yrs  ago- no issues since.All heart testing negative. No further cardiac visits needed.   Past Surgical History:  Procedure Laterality Date   ABDOMINAL HYSTERECTOMY  1984   BILATERAL SALPINGOOPHORECTOMY Bilateral 2000s   BREAST BIOPSY Left 2009   BREAST LUMPECTOMY Left 2009   sentinel node dissection   BUNIONECTOMY Bilateral early 2000s   CARDIAC CATHETERIZATION  2015   DIAGNOSTIC LAPAROSCOPY  1990s-early 2000s   multiple for adhesions   DILATION AND CURETTAGE OF UTERUS  2000   w/laparoscopies   HAMMER TOE SURGERY Right 09/2015; 06/22/2016; 12/28/2016   2nd digit   KNEE ARTHROSCOPY Left 09/21/2015   Procedure: LEFT ARTHROSCOPY KNEE WITH  LATERAL MENISCUS DEBRIDEMENT;  Surgeon: Gaynelle Arabian, MD;  Location: [facility];  Service: Orthopedics;  Laterality: Left;   KNEE ARTHROSCOPY Right 10/09/2021   Procedure: Right knee arthroscopy; lateral meniscal debridement;  Surgeon: Gaynelle Arabian, MD;  Location: [facility];  Service: Orthopedics;  Laterality: Right;   KNEE SURGERY  10/09/2021   Right knee   LAPAROSCOPIC CHOLECYSTECTOMY     RHINOPLASTY  1979   RIGHT/LEFT HEART CATH AND CORONARY ANGIOGRAPHY N/A 02/06/2017   Procedure: Right/Left Heart Cath and Coronary Angiography;  Surgeon: Jolaine Artist, MD;  Location: [facility];  Service: Cardiovascular;  Laterality: N/A;   TONSILLECTOMY AND ADENOIDECTOMY  1955   TRANSANAL RECTOPEXY  1990s   Family History  Problem Relation Age of Onset   Heart disease Mother    Hypertension Father    Breast cancer Sister    Lung cancer Sister    Skin cancer Brother    Social History   Socioeconomic History   Marital status: Married    Spouse name: Not on file   Number of children: 2   Years of education: Not on file   Highest education level: Not on file  Occupational History   Occupation: anesthetist    Employer: [employer]: retired June 2019  Tobacco Use   Smoking status: Former    Packs/day: 2.00    Years: 10.00    Total pack years: 20.00    Types: Cigarettes    Quit date: 07/09/1989    Years since quitting: 32.9   Smokeless tobacco: Never  Vaping Use   Vaping Use: Never used  Substance and Sexual Activity   Alcohol use: No   Drug use: No   Sexual activity: Yes  Other Topics Concern   Not on file  Social History Narrative   Patient with caregiver stress due to spouses declining health   Some conflicts with children and addictions (coping well)    Social Determinants of Health   Financial Resource Strain: Low Risk  (06/06/2022)   Overall Financial Resource Strain (CARDIA)    Difficulty of Paying Living Expenses: Not hard at all  Food Insecurity: No  Food Insecurity (06/06/2022)   Hunger Vital Sign    Worried About Running Out of Food in the Last Year: Never true    Ran Out of Food in the Last Year: Never true  Transportation Needs: No Transportation Needs (06/06/2022)   PRAPARE - Hydrologist (Medical): No    Lack of Transportation (Non-Medical): No  Physical Activity: Sufficiently Active (06/20/2020)   Exercise Vital Sign    Days of Exercise per Week: 7 days    Minutes of Exercise per Session: 60 min  Stress: Stress Concern Present (06/06/2022)   [place]  Feeling of Stress : To some extent  Social Connections: Moderately Integrated (06/06/2022)   Social Connection and Isolation Panel [NHANES]    Frequency of Communication with Friends and Family: More than three times a week    Frequency of Social Gatherings with Friends and Family: Once a week    Attends Religious Services: Never    Marine scientist or Organizations: Yes    Attends Music therapist: More than 4 times per year    Marital Status: Married    Tobacco Counseling Counseling given: Not Answered   Clinical Intake:  Pre-visit preparation completed: Yes  Pain : No/denies pain     Diabetes: No  How often do you need to have someone help you when you read instructions, pamphlets, or other written materials from your doctor or pharmacy?: 1 - Never  Diabetic?  no  Interpreter Needed?: No  Information entered by :: Leroy Kennedy LPN   Activities of Daily Living    06/06/2022   12:01 PM 12/13/2021   10:00 AM  In your present state of health, do you have any difficulty performing the following activities:  Hearing? 0 0  Vision? 0 0  Difficulty concentrating or making decisions? 0 0  Walking or climbing stairs? 0 0  Dressing or bathing? 0 0  Doing errands, shopping? 0 0  Preparing Food and eating ? N   Using the Toilet? N   In the past  six months, have you accidently leaked urine? N   Do you have problems with loss of bowel control? N   Managing your Medications? N   Managing your Finances? N   Housekeeping or managing your Housekeeping? N     Patient Care Team: [name], MD as PCP - General (Family Medicine) Maisie Fus, MD (Inactive) as Consulting Physician (Obstetrics and Gynecology) Haroldine Laws, Shaune Pascal, MD as Consulting Physician (Cardiology) Ulla Gallo, MD as Consulting Physician (Dermatology) Madelin Rear, [facility] (Inactive) as Pharmacist (Pharmacist) Clarene Essex, MD as Consulting Physician (Gastroenterology)  Indicate any recent Medical Services you may have received from other than [facility] providers in the past year (date may be approximate).     Assessment:   This is a routine wellness examination for [facility].  Hearing/Vision screen Hearing Screening - Comments:: No trouble hearing Vision Screening - Comments:: Up to date [employer]  Dietary issues and exercise activities discussed: Current Exercise Habits: The patient does not participate in regular exercise at present   Goals Addressed             This Visit's Progress    Weight (lb) < 200 lb (90.7 kg)   154 lb 12.8 oz (70.2 kg)      Depression Screen    06/06/2022   12:09 PM 12/13/2021    9:59 AM 11/06/2021    1:49 PM 06/13/2021    9:33 AM 06/20/2020    9:53 AM 06/13/2020   10:05 AM 04/18/2020   12:58 PM  PHQ 2/9 Scores  PHQ - 2 Score 1 0 0 1 0 0 0  PHQ- 9 Score 2 0 3 3  0 0    Fall Risk    06/06/2022   11:59 AM 12/13/2021    9:59 AM 11/06/2021    1:49 PM 06/13/2021    9:33 AM 06/20/2020    9:52 AM  Fall Risk   Falls in the past year? 0 0 0 1 1  Number falls in past yr: 0 0 0  0 1  Injury with Fall? 0 0 0 0 0  Risk for fall due to :  No Fall Risks No Fall Risks History of fall(s) History of fall(s)  Follow up Falls evaluation completed;Education provided;Falls prevention discussed Falls evaluation  completed Falls evaluation completed Falls evaluation completed Falls prevention discussed    FALL RISK PREVENTION PERTAINING TO THE HOME:  Any stairs in or around the home? Yes  If so, are there any without handrails? No  Home free of loose throw rugs in walkways, pet beds, electrical cords, etc? No  Adequate lighting in your home to reduce risk of falls? No   ASSISTIVE DEVICES UTILIZED TO PREVENT FALLS:  Life alert? No  Use of a cane, walker or w/c? No  Grab bars in the bathroom? No  Shower chair or bench in shower? Yes  Elevated toilet seat or a handicapped toilet? No   TIMED UP AND GO:  Was the test performed? No .    Cognitive Function:        06/06/2022   12:05 PM  6CIT Screen  What Year? 0 points  What month? 0 points  What time? 0 points  Count back from 20 0 points  Months in reverse 0 points  Repeat phrase 0 points  Total Score 0 points    Immunizations Immunization History  Administered Date(s) Administered   Fluad Quad(high Dose 65+) 04/22/2019, 04/15/2020, 04/26/2021   Influenza Inj Mdck Quad With Preservative 04/23/2018   Influenza Split 05/09/2013   Influenza-Unspecified 03/29/2017   PFIZER(Purple Top)SARS-COV-2 Vaccination 07/28/2019, 08/17/2019, 05/04/2020   Pneumococcal Conjugate-13 01/03/2017   Pneumococcal Polysaccharide-23 06/13/2020    TDAP status: Due, Education has been provided regarding the importance of this vaccine. Advised may receive this vaccine at local pharmacy or Health Dept. Aware to provide a copy of the vaccination record if obtained from local pharmacy or Health Dept. Verbalized acceptance and understanding.  Flu Vaccine status: Up to date  Pneumococcal vaccine status: Up to date  Covid-19 vaccine status: Declined, Education has been provided regarding the importance of this vaccine but patient still declined. Advised may receive this vaccine at local pharmacy or Health Dept.or vaccine clinic. Aware to provide a copy of  the vaccination record if obtained from local pharmacy or Health Dept. Verbalized acceptance and understanding.  Qualifies for Shingles Vaccine? Yes   Zostavax completed No   Shingrix Completed?: No.    Education has been provided regarding the importance of this vaccine. Patient has been advised to call insurance company to determine out of pocket expense if they have not yet received this vaccine. Advised may also receive vaccine at local pharmacy or Health Dept. Verbalized acceptance and understanding.  Screening Tests Health Maintenance  Topic Date Due   Zoster Vaccines- Shingrix (1 of 2) Never done   INFLUENZA VACCINE  02/06/2022   Medicare Annual Wellness (AWV)  06/07/2023   MAMMOGRAM  03/19/2024   COLONOSCOPY (Pts 45-100yr Insurance coverage will need to be confirmed)  02/08/2032   Pneumonia Vaccine 76 Years old  Completed   DEXA SCAN  Completed   HPV VACCINES  Aged Out   COVID-19 Vaccine  Discontinued   Hepatitis C Screening  Discontinued    Health Maintenance  Health Maintenance Due  Topic Date Due   Zoster Vaccines- Shingrix (1 of 2) Never done   INFLUENZA VACCINE  02/06/2022    Colorectal cancer screening: Type of screening: Colonoscopy. Completed 2023. Repeat every 10 years  Mammogram status: Completed  . Repeat  every year  Bone Density declined  Lung Cancer Screening: (Low Dose CT Chest recommended if Age 61-80 years, 30 pack-year currently smoking OR have quit w/in 15years.) does not qualify.   Lung Cancer Screening Referral:   Additional Screening:  Hepatitis C Screening: does qualify;  Vision Screening: Recommended annual ophthalmology exams for early detection of glaucoma and other disorders of the eye. Is the patient up to date with their annual eye exam?  Yes  Who is the provider or what is the name of the office in which the patient attends annual eye exams? [facility] If pt is not established with a provider, would they like to be referred to a  provider to establish care? No .   Dental Screening: Recommended annual dental exams for proper oral hygiene  Community Resource Referral / Chronic Care Management: CRR required this visit?  No   CCM required this visit?  No      Plan:     I have personally reviewed and noted the following in the patient's chart:   Medical and social history Use of alcohol, tobacco or illicit drugs  Current medications and supplements including opioid prescriptions. Patient is not currently taking opioid prescriptions. Functional ability and status Nutritional status Physical activity Advanced directives List of other physicians Hospitalizations, surgeries, and ER visits in previous 12 months Vitals Screenings to include cognitive, depression, and falls Referrals and appointments  In addition, I have reviewed and discussed with patient certain preventive protocols, quality metrics, and best practice recommendations. A written personalized care plan for preventive services as well as general preventive health recommendations were provided to patient.     Leroy Kennedy, LPN   [id number]   Nurse Notes:

## 2022-06-06 NOTE — Patient Instructions (Signed)
Ms. Cragun , Thank you for taking time to come for your Medicare Wellness Visit. I appreciate your ongoing commitment to your health goals. Please review the following plan we discussed and let me know if I can assist you in the future.   These are the goals we discussed:  Goals      Hyperlipidemia: LDL <70     CARE PLAN ENTRY (see longitudinal plan of care for additional care plan information)  Current Barriers:  Uncontrolled hyperlipidemia Current antihyperlipidemic regimen: Diet and Exervise Previous antihyperlipidemic medications tried: n/a  Most recent lipid panel:     Component Value Date/Time   CHOL 275 (H) 06/13/2020 1036   TRIG 166.0 (H) 06/13/2020 1036   HDL 54.10 06/13/2020 1036   CHOLHDL 5 06/13/2020 1036   VLDL 33.2 06/13/2020 1036   LDLCALC 188 (H) 06/13/2020 1036   LDLDIRECT 175.0 05/27/2019 1337  10-year ASCVD risk score: 13.6%  Pharmacist Clinical Goal(s):  Over the next 90 days, patient will work with PharmD and providers towards optimized antihyperlipidemic therapy  Interventions: Comprehensive medication review performed; medication list updated in electronic medical record.  Inter-disciplinary care team collaboration (see longitudinal plan of care)  Patient Self Care Activities:  Over next three months: focus on diet and exercise for cholesterol control.  Continue rosuvastatin 10 mg daily. Please call with any questions! Initial goal documentation     Patient Stated     Lose weight by eating healthier. Drink more water     Weight (lb) < 200 lb (90.7 kg)        This is a list of the screening recommended for you and due dates:  Health Maintenance  Topic Date Due   Zoster (Shingles) Vaccine (1 of 2) 09/06/2022*   Medicare Annual Wellness Visit  06/07/2023   Mammogram  03/19/2024   Colon Cancer Screening  02/08/2032   Pneumonia Vaccine  Completed   Flu Shot  Completed   DEXA scan (bone density measurement)  Completed   HPV Vaccine  Aged Out    COVID-19 Vaccine  Discontinued   Hepatitis C Screening: USPSTF Recommendation to screen - Ages 18-79 yo.  Discontinued  *Topic was postponed. The date shown is not the original due date.    Advanced directives: education provided  Conditions/risks identified:      Preventive Care 10 Years and Older, Female Preventive care refers to lifestyle choices and visits with your health care provider that can promote health and wellness. What does preventive care include? A yearly physical exam. This is also called an annual well check. Dental exams once or twice a year. Routine eye exams. Ask your health care provider how often you should have your eyes checked. Personal lifestyle choices, including: Daily care of your teeth and gums. Regular physical activity. Eating a healthy diet. Avoiding tobacco and drug use. Limiting alcohol use. Practicing safe sex. Taking low-dose aspirin every day. Taking vitamin and mineral supplements as recommended by your health care provider. What happens during an annual well check? The services and screenings done by your health care provider during your annual well check will depend on your age, overall health, lifestyle risk factors, and family history of disease. Counseling  Your health care provider may ask you questions about your: Alcohol use. Tobacco use. Drug use. Emotional well-being. Home and relationship well-being. Sexual activity. Eating habits. History of falls. Memory and ability to understand (cognition). Work and work Statistician. Reproductive health. Screening  You may have the following tests or measurements: Height,  weight, and BMI. Blood pressure. Lipid and cholesterol levels. These may be checked every 5 years, or more frequently if you are over 19 years old. Skin check. Lung cancer screening. You may have this screening every year starting at age 73 if you have a 30-pack-year history of smoking and currently smoke or  have quit within the past 15 years. Fecal occult blood test (FOBT) of the stool. You may have this test every year starting at age 14. Flexible sigmoidoscopy or colonoscopy. You may have a sigmoidoscopy every 5 years or a colonoscopy every 10 years starting at age 52. Hepatitis C blood test. Hepatitis B blood test. Sexually transmitted disease (STD) testing. Diabetes screening. This is done by checking your blood sugar (glucose) after you have not eaten for a while (fasting). You may have this done every 1-3 years. Bone density scan. This is done to screen for osteoporosis. You may have this done starting at age 61. Mammogram. This may be done every 1-2 years. Talk to your health care provider about how often you should have regular mammograms. Talk with your health care provider about your test results, treatment options, and if necessary, the need for more tests. Vaccines  Your health care provider may recommend certain vaccines, such as: Influenza vaccine. This is recommended every year. Tetanus, diphtheria, and acellular pertussis (Tdap, Td) vaccine. You may need a Td booster every 10 years. Zoster vaccine. You may need this after age 71. Pneumococcal 13-valent conjugate (PCV13) vaccine. One dose is recommended after age 26. Pneumococcal polysaccharide (PPSV23) vaccine. One dose is recommended after age 68. Talk to your health care provider about which screenings and vaccines you need and how often you need them. This information is not intended to replace advice given to you by your health care provider. Make sure you discuss any questions you have with your health care provider. Document Released: 07/22/2015 Document Revised: 03/14/2016 Document Reviewed: 04/26/2015 Elsevier Interactive Patient Education  2017 [place] Prevention in the Home Falls can cause injuries. They can happen to people of all ages. There are many things you can do to make your home safe and to help  prevent falls. What can I do on the outside of my home? Regularly fix the edges of walkways and driveways and fix any cracks. Remove anything that might make you trip as you walk through a door, such as a raised step or threshold. Trim any bushes or trees on the path to your home. Use bright outdoor lighting. Clear any walking paths of anything that might make someone trip, such as rocks or tools. Regularly check to see if handrails are loose or broken. Make sure that both sides of any steps have handrails. Any raised decks and porches should have guardrails on the edges. Have any leaves, snow, or ice cleared regularly. Use sand or salt on walking paths during winter. Clean up any spills in your garage right away. This includes oil or grease spills. What can I do in the bathroom? Use night lights. Install grab bars by the toilet and in the tub and shower. Do not use towel bars as grab bars. Use non-skid mats or decals in the tub or shower. If you need to sit down in the shower, use a plastic, non-slip stool. Keep the floor dry. Clean up any water that spills on the floor as soon as it happens. Remove soap buildup in the tub or shower regularly. Attach bath mats securely with double-sided non-slip rug tape.  Do not have throw rugs and other things on the floor that can make you trip. What can I do in the bedroom? Use night lights. Make sure that you have a light by your bed that is easy to reach. Do not use any sheets or blankets that are too big for your bed. They should not hang down onto the floor. Have a firm chair that has side arms. You can use this for support while you get dressed. Do not have throw rugs and other things on the floor that can make you trip. What can I do in the kitchen? Clean up any spills right away. Avoid walking on wet floors. Keep items that you use a lot in easy-to-reach places. If you need to reach something above you, use a strong step stool that has a  grab bar. Keep electrical cords out of the way. Do not use floor polish or wax that makes floors slippery. If you must use wax, use non-skid floor wax. Do not have throw rugs and other things on the floor that can make you trip. What can I do with my stairs? Do not leave any items on the stairs. Make sure that there are handrails on both sides of the stairs and use them. Fix handrails that are broken or loose. Make sure that handrails are as long as the stairways. Check any carpeting to make sure that it is firmly attached to the stairs. Fix any carpet that is loose or worn. Avoid having throw rugs at the top or bottom of the stairs. If you do have throw rugs, attach them to the floor with carpet tape. Make sure that you have a light switch at the top of the stairs and the bottom of the stairs. If you do not have them, ask someone to add them for you. What else can I do to help prevent falls? Wear shoes that: Do not have high heels. Have rubber bottoms. Are comfortable and fit you well. Are closed at the toe. Do not wear sandals. If you use a stepladder: Make sure that it is fully opened. Do not climb a closed stepladder. Make sure that both sides of the stepladder are locked into place. Ask someone to hold it for you, if possible. Clearly mark and make sure that you can see: Any grab bars or handrails. First and last steps. Where the edge of each step is. Use tools that help you move around (mobility aids) if they are needed. These include: Canes. Walkers. Scooters. Crutches. Turn on the lights when you go into a dark area. Replace any light bulbs as soon as they burn out. Set up your furniture so you have a clear path. Avoid moving your furniture around. If any of your floors are uneven, fix them. If there are any pets around you, be aware of where they are. Review your medicines with your doctor. Some medicines can make you feel dizzy. This can increase your chance of  falling. Ask your doctor what other things that you can do to help prevent falls. This information is not intended to replace advice given to you by your health care provider. Make sure you discuss any questions you have with your health care provider. Document Released: 04/21/2009 Document Revised: 12/01/2015 Document Reviewed: 07/30/2014 Elsevier Interactive Patient Education  2017 [employer].

## 2022-06-12 ENCOUNTER — Other Ambulatory Visit (HOSPITAL_COMMUNITY): Payer: Self-pay | Admitting: Internal Medicine

## 2022-06-13 ENCOUNTER — Other Ambulatory Visit (HOSPITAL_COMMUNITY): Payer: Self-pay

## 2022-06-13 ENCOUNTER — Other Ambulatory Visit: Payer: Self-pay

## 2022-06-13 MED ORDER — IVABRADINE HCL 5 MG PO TABS
5.0000 mg | ORAL_TABLET | Freq: Two times a day (BID) | ORAL | 3 refills | Status: DC
  Filled 2022-06-13: qty 60, 30d supply, fill #0
  Filled 2022-07-13: qty 60, 30d supply, fill #1
  Filled 2022-08-17: qty 60, 30d supply, fill #2
  Filled 2022-08-23 – 2022-09-19 (×2): qty 60, 30d supply, fill #3

## 2022-06-19 DIAGNOSIS — Z131 Encounter for screening for diabetes mellitus: Secondary | ICD-10-CM | POA: Diagnosis not present

## 2022-06-26 ENCOUNTER — Other Ambulatory Visit (HOSPITAL_COMMUNITY): Payer: Self-pay | Admitting: Internal Medicine

## 2022-06-26 ENCOUNTER — Other Ambulatory Visit: Payer: Self-pay | Admitting: Family Medicine

## 2022-06-27 ENCOUNTER — Other Ambulatory Visit: Payer: Self-pay

## 2022-06-27 ENCOUNTER — Other Ambulatory Visit (HOSPITAL_COMMUNITY): Payer: Self-pay

## 2022-06-27 MED ORDER — CARVEDILOL 3.125 MG PO TABS
3.1250 mg | ORAL_TABLET | Freq: Two times a day (BID) | ORAL | 3 refills | Status: DC
  Filled 2022-06-27: qty 60, 30d supply, fill #0
  Filled 2022-07-22: qty 60, 30d supply, fill #1
  Filled 2022-08-17: qty 60, 30d supply, fill #2
  Filled 2022-08-22 – 2022-09-19 (×3): qty 60, 30d supply, fill #3

## 2022-06-27 MED ORDER — ROSUVASTATIN CALCIUM 10 MG PO TABS
10.0000 mg | ORAL_TABLET | Freq: Every evening | ORAL | 1 refills | Status: DC
  Filled 2022-06-27: qty 90, 90d supply, fill #0
  Filled 2022-08-23 – 2022-09-19 (×2): qty 90, 90d supply, fill #1

## 2022-07-13 ENCOUNTER — Other Ambulatory Visit: Payer: Self-pay

## 2022-08-17 ENCOUNTER — Other Ambulatory Visit (HOSPITAL_COMMUNITY): Payer: Self-pay

## 2022-08-22 ENCOUNTER — Other Ambulatory Visit (HOSPITAL_COMMUNITY): Payer: Self-pay

## 2022-08-23 ENCOUNTER — Other Ambulatory Visit (HOSPITAL_COMMUNITY): Payer: Self-pay

## 2022-08-23 ENCOUNTER — Other Ambulatory Visit: Payer: Self-pay

## 2022-08-24 ENCOUNTER — Other Ambulatory Visit: Payer: Self-pay

## 2022-08-24 ENCOUNTER — Other Ambulatory Visit (HOSPITAL_COMMUNITY): Payer: Self-pay

## 2022-08-28 ENCOUNTER — Other Ambulatory Visit (HOSPITAL_COMMUNITY): Payer: Self-pay

## 2022-09-03 ENCOUNTER — Telehealth (HOSPITAL_COMMUNITY): Payer: Self-pay | Admitting: *Deleted

## 2022-09-03 NOTE — Telephone Encounter (Signed)
Pt called requesting a sooner appt with Dr.Bensimhon because she is going out of the [place] in [place] and had noticed a few times that her bp ws low systolic bp [id number]. She said this is not consistent or urgent but when her bp is low she is a little more tired. Appt scheduled.

## 2022-09-19 ENCOUNTER — Other Ambulatory Visit: Payer: Self-pay

## 2022-09-19 ENCOUNTER — Other Ambulatory Visit (HOSPITAL_COMMUNITY): Payer: Self-pay

## 2022-09-20 ENCOUNTER — Other Ambulatory Visit (HOSPITAL_COMMUNITY): Payer: Self-pay

## 2022-09-20 ENCOUNTER — Other Ambulatory Visit: Payer: Self-pay

## 2022-09-20 ENCOUNTER — Other Ambulatory Visit (HOSPITAL_BASED_OUTPATIENT_CLINIC_OR_DEPARTMENT_OTHER): Payer: Self-pay

## 2022-10-04 ENCOUNTER — Encounter: Payer: Self-pay | Admitting: Family Medicine

## 2022-10-09 ENCOUNTER — Encounter (HOSPITAL_COMMUNITY): Payer: Self-pay | Admitting: Internal Medicine

## 2022-10-09 ENCOUNTER — Ambulatory Visit (HOSPITAL_COMMUNITY)
Admission: RE | Admit: 2022-10-09 | Discharge: 2022-10-09 | Disposition: A | Discharge status: Home / Self Care | Payer: PPO | Source: Ambulatory Visit | Attending: Internal Medicine | Admitting: Internal Medicine

## 2022-10-09 ENCOUNTER — Other Ambulatory Visit (HOSPITAL_COMMUNITY): Payer: Self-pay

## 2022-10-09 ENCOUNTER — Other Ambulatory Visit: Payer: Self-pay

## 2022-10-09 VITALS — BP 100/85 | HR 85 | Wt 136.6 lb

## 2022-10-09 DIAGNOSIS — I428 Other cardiomyopathies: Secondary | ICD-10-CM | POA: Diagnosis not present

## 2022-10-09 DIAGNOSIS — R911 Solitary pulmonary nodule: Secondary | ICD-10-CM | POA: Insufficient documentation

## 2022-10-09 DIAGNOSIS — R002 Palpitations: Secondary | ICD-10-CM | POA: Diagnosis not present

## 2022-10-09 DIAGNOSIS — I447 Left bundle-branch block, unspecified: Secondary | ICD-10-CM | POA: Diagnosis not present

## 2022-10-09 DIAGNOSIS — I2510 Atherosclerotic heart disease of native coronary artery without angina pectoris: Secondary | ICD-10-CM | POA: Diagnosis not present

## 2022-10-09 DIAGNOSIS — Z79899 Other long term (current) drug therapy: Secondary | ICD-10-CM | POA: Diagnosis not present

## 2022-10-09 DIAGNOSIS — I350 Nonrheumatic aortic (valve) stenosis: Secondary | ICD-10-CM

## 2022-10-09 DIAGNOSIS — Z853 Personal history of malignant neoplasm of breast: Secondary | ICD-10-CM | POA: Insufficient documentation

## 2022-10-09 DIAGNOSIS — Z923 Personal history of irradiation: Secondary | ICD-10-CM | POA: Insufficient documentation

## 2022-10-09 DIAGNOSIS — I5023 Acute on chronic systolic (congestive) heart failure: Secondary | ICD-10-CM | POA: Insufficient documentation

## 2022-10-09 DIAGNOSIS — I493 Ventricular premature depolarization: Secondary | ICD-10-CM

## 2022-10-09 DIAGNOSIS — I5022 Chronic systolic (congestive) heart failure: Secondary | ICD-10-CM

## 2022-10-09 DIAGNOSIS — E785 Hyperlipidemia, unspecified: Secondary | ICD-10-CM | POA: Insufficient documentation

## 2022-10-09 DIAGNOSIS — E782 Mixed hyperlipidemia: Secondary | ICD-10-CM | POA: Diagnosis not present

## 2022-10-09 DIAGNOSIS — I110 Hypertensive heart disease with heart failure: Secondary | ICD-10-CM | POA: Diagnosis not present

## 2022-10-09 DIAGNOSIS — I080 Rheumatic disorders of both mitral and aortic valves: Secondary | ICD-10-CM | POA: Insufficient documentation

## 2022-10-09 DIAGNOSIS — Z87891 Personal history of nicotine dependence: Secondary | ICD-10-CM | POA: Insufficient documentation

## 2022-10-09 MED ORDER — ONDANSETRON HCL 4 MG PO TABS
4.0000 mg | ORAL_TABLET | Freq: Three times a day (TID) | ORAL | 1 refills | Status: DC | PRN
  Filled 2022-10-09: qty 30, 10d supply, fill #0

## 2022-10-09 MED ORDER — LOSARTAN POTASSIUM 25 MG PO TABS: 25.0000 mg | ORAL_TABLET | Freq: Every day | ORAL | 11 refills | Status: DC

## 2022-10-09 NOTE — Patient Instructions (Signed)
DECREASE Losartan to 25 mg daily   Your physician has requested that you have an echocardiogram. Echocardiography is a painless test that uses sound waves to create images of your heart. It provides your doctor with information about the size and shape of your heart and how well your heart's chambers and valves are working. This procedure takes approximately one hour. There are no restrictions for this procedure. Please do NOT wear cologne, perfume, aftershave, or lotions (deodorant is allowed). Please arrive 15 minutes prior to your appointment time.  Your physician recommends that you schedule a follow-up appointment in: 4-6 months   If you have any questions or concerns before your next appointment please send [place] a message through [place] or call our office at [phone].    TO LEAVE A MESSAGE FOR THE NURSE SELECT OPTION 2, PLEASE LEAVE A MESSAGE INCLUDING: YOUR NAME DATE OF BIRTH CALL BACK NUMBER REASON FOR CALL**this is important as we prioritize the call backs  YOU WILL RECEIVE A CALL BACK THE SAME DAY AS LONG AS YOU CALL BEFORE 4:00 PM  At the [facility], you and your health needs are our priority. As part of our continuing mission to provide you with exceptional heart care, we have created designated Provider Care Teams. These Care Teams include your primary Cardiologist (physician) and Advanced Practice Providers (APPs- Physician Assistants and Nurse Practitioners) who all work together to provide you with the care you need, when you need it.   You may see any of the following providers on your designated Care Team at your next follow up: Dr Glori Bickers Dr Loralie Champagne Dr. Roxana Hires, NP Lyda Jester, [place] [facility] [place], [place] Forestine Na, NP Audry Riles, PharmD   Please be sure to bring in all your medications bottles to every appointment.    Thank you for choosing [facility]

## 2022-10-09 NOTE — Progress Notes (Incomplete)
Advanced Heart Failure Clinic Note   Primary Cardiologist: Dr. 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 T1No Grade III invasive ductal carcinoma and received dose-dense doxorubicin in 2009 and cyclophosphamide x4, paclitaxel x12  Admitted 7/18 with new onset acute systolic CHF. EF 15-20%. Right and left heart cath done and showed  moderate non-obstructive CAD Fick output/index 3.1/1.8.  Holter monitor 8/18: Multiform PVCs, couplets, and triplets were noted with periods of bigeminy. PVC burden < 1%.No VT observed.  Echo 9/19: EF 45-50% moderate AS mean gradient 12mmHG Echo 8/21: EF 50-55% mild to moderate AS mean 31mmHG  Zio 11/21: SR 72. 1.3% PVCs. Marland Kitchen   Echo  08/21/21: EF 45-50% (EF 40-45%) with LBBB dyssynchrony. Mild-mod AS mean gradient 13 AVA 1.01 cm2   Here for routine f/u. Has lost 15-20 pounds. Remains active. Playing Pickleball. Says she isn't feeling very well. Intermittent CP. Said for about a week SBP was in 80s for about a week. Now back to low 100s. Fatigued a lot. No LE edema, orthopnea or PND. + ab bloating. Has had 4 episodes of hypoglycemia after eating big meals.    Cardiac studies:   Zio 11/21 1. Sinus rhythm - avg HR of 72 2. Isolated PACs were rare 3. Occasional PVCs  (1.3%, 17991) 4. No high-grade arrhythmias 5. Patient triggered events associated with isolated PVCs.    CPX 01/2018 Peak VO2: 21.5 (108% predicted peak VO2) VE/VCO2 slope:  48 OUES: 1.84 Peak RER: 0.93  Echo 05/09/17 EF ~30% RV ok. Personally reviewed  Echo 02/05/17 LVEF 15-20%, Grade 1 DD, Mild/Mod central MR, Mild LAE  R/LHC 02/06/17 There is severe left ventricular systolic dysfunction. Dist Cx lesion, 20 %stenosed. 1st Mrg lesion, 60 %stenosed. Dist RCA lesion, 20 %stenosed. Mid LAD to Dist LAD lesion, 20  %stenosed.   Cardiac MRI 06/2017 1.  Moderate LVE with diffuse hypokinesis EF 35% 2. Small area of apical hyperenhancement on delayed gadolinium images 3.  Mild MR 4.  Mild LAE 5. Thickened tri leaflet AV with restricted leaflet motion suggest echo for AS correlation 6.  Normal RV size and function 7.  Normal aortic root 3.0 cm  Review of systems complete and found to be negative unless listed in HPI.   Past Medical History:  Diagnosis Date   Arthritis    ostearthritis -joints, greater left knee.   Breast cancer    Breast cancer, left breast 2009   S/P surgery, radiation, chemotherapy.-no further oncology visits.   CHF (congestive heart failure) dx'd 02/05/2017   Hyperlipidemia    Hypertension    Neuromuscular disorder    neuropathy due to chemotherapy   Personal history of chemotherapy 2009   Personal history of radiation therapy 2009   Seizures    "childbirth related"   Sleep apnea    mild no C-PAP   SVT (supraventricular tachycardia)    x1 episode -3 yrs ago- no issues since.All heart testing negative. No further cardiac visits needed.   Current Outpatient Medications  Medication Sig Dispense Refill   carvedilol (COREG) 3.125 MG tablet Take 1 tablet (3.125 mg total) by mouth 2 (two) times daily. 60 tablet 3   Coenzyme Q10 (COQ10) 100 MG CAPS      empagliflozin (JARDIANCE) 10 MG TABS tablet Take 1 tablet (10 mg total) by mouth daily  before breakfast. 90 tablet 1   ivabradine (CORLANOR) 5 MG TABS tablet Take 1 tablet (5 mg total) by mouth 2 (two) times daily with a meal. 60 tablet 3   losartan (COZAAR) 25 MG tablet Take 1 tablet (25 mg total) by mouth 2 (two) times daily. 60 tablet 11   Magnesium 250 MG TABS Take 250 mg by mouth daily.     mexiletine (MEXITIL) 150 MG capsule TAKE 1 CAPSULE BY MOUTH 2 TIMES DAILY. 180 capsule 3   ondansetron (ZOFRAN) 4 MG tablet Take 1 tablet (4 mg total) by mouth every 8 (eight) hours as needed for nausea or vomiting. 30 tablet 1    rosuvastatin (CRESTOR) 10 MG tablet Take 1 tablet (10 mg total) by mouth at bedtime. 90 tablet 1   spironolactone (ALDACTONE) 25 MG tablet Take 0.5 tablets (12.5 mg total) by mouth daily. 45 tablet 3   zinc gluconate 50 MG tablet Take 50 mg by mouth daily.     No current facility-administered medications for this encounter.   Allergies  Allergen Reactions   Effexor [Venlafaxine] Other (See Comments)    Caused depression, negative thoughts, and crying    Hydrocodone-Acetaminophen Itching and Nausea And Vomiting    Patient can tolerate with Zofran    Social History   Socioeconomic History   Marital status: Married    Spouse name: Not on file   Number of children: 2   Years of education: Not on file   Highest education level: Not on file  Occupational History   Occupation: anesthetist    Employer: [employer]: retired June 2019  Tobacco Use   Smoking status: Former    Packs/day: 2.00    Years: 10.00    Additional pack years: 0.00    Total pack years: 20.00    Types: Cigarettes    Quit date: 07/09/1989    Years since quitting: 33.2   Smokeless tobacco: Never  Vaping Use   Vaping Use: Never used  Substance and Sexual Activity   Alcohol use: No   Drug use: No   Sexual activity: Yes  Other Topics Concern   Not on file  Social History Narrative   Patient with caregiver stress due to spouses declining health   Some conflicts with children and addictions (coping well)    Social Determinants of Health   Financial Resource Strain: Low Risk  (06/06/2022)   Overall Financial Resource Strain (CARDIA)    Difficulty of Paying Living Expenses: Not hard at all  Food Insecurity: No Food Insecurity (06/06/2022)   Hunger Vital Sign    Worried About Running Out of Food in the Last Year: Never true    Ran Out of Food in the Last Year: Never true  Transportation Needs: No Transportation Needs (06/06/2022)   PRAPARE - Hydrologist (Medical): No     Lack of Transportation (Non-Medical): No  Physical Activity: Inactive (06/06/2022)   Exercise Vital Sign    Days of Exercise per Week: 0 days    Minutes of Exercise per Session: 0 min  Stress: Stress Concern Present (06/06/2022)   [place]    Feeling of Stress : To some extent  Social Connections: Moderately Integrated (06/06/2022)   Social Connection and Isolation Panel [NHANES]    Frequency of Communication with Friends and Family: More than three times a week    Frequency of Social Gatherings with Friends and Family:  Once a week    Attends Religious Services: Never    Active Member of Clubs or Organizations: Yes    Attends Archivist Meetings: More than 4 times per year    Marital Status: Married  Human resources officer Violence: Not At Risk (06/20/2020)   Humiliation, Afraid, Rape, and Kick questionnaire    Fear of Current or Ex-Partner: No    Emotionally Abused: No    Physically Abused: No    Sexually Abused: No   Family History  Problem Relation Age of Onset   Heart disease Mother    Hypertension Father    Breast cancer Sister    Lung cancer Sister    Skin cancer Brother    Vitals:   10/09/22 1415  BP: 100/85  Pulse: 85  SpO2: 100%  Weight: 62 kg (136 lb 9.6 oz)     Wt Readings from Last 3 Encounters:  10/09/22 62 kg (136 lb 9.6 oz)  06/06/22 70.2 kg (154 lb 12.8 oz)  02/22/22 67.1 kg (148 lb)    PHYSICAL EXAM: General:  Well appearing. No resp difficulty HEENT: normal Neck: supple. no JVD. Carotids 2+ bilat; + bruits. No lymphadenopathy or thryomegaly appreciated. Cor: PMI nondisplaced. Regular rate & rhythm. 2/6 AS Lungs: clear Abdomen: soft, nontender, nondistended. No hepatosplenomegaly. No bruits or masses. Good bowel sounds. Extremities: no cyanosis, clubbing, rash, edema Neuro: alert & orientedx3, cranial nerves grossly intact. moves all 4 extremities w/o difficulty. Affect  pleasant  ECG: NSR 91 LBBB 134 ms Personally reviewed   ASSESSMENT & PLAN:  1. Chronic systolic CHF: NICM, EF 0000000, felt to be related to doxorubicin therapy. But also with frequent PVC's, holter monitor showed 1% PVC's but patient complained of very frequent PVC's.  - Cath 8/18 mild non-obs CAD. LAD 20%, OM-1 60% RCA 20% - Echo 05/2017 ~30-35%. LV size - Echo 03/09/18 EF 45-50% - CPX 01/2018 Peak VO2: 21.5 (108% predicted peak VO2) VE/VCO2 slope:  48 Peak RER: 0.93 - Echo 10/20 EF 40-45% mild to moderate AS - Echo 8/21 EF 50-55% - Echo  08/21/21: EF 45-50% (I thought EF 40-45%) with LBBB dyssynchrony. Mild-mod AS mean gradient 13 AVA 1.01 cm2  - Stable NYHA II-III.  - Volume status ok  - Continue Spiro 12.5 mg daily. Did not tolerate higher dose. - Decrease losartan 25 mg bid -> losartan 25 daily - Continue carvedilol to 3.125 mg BID (couldn't tolerate 6.25 bid) - Continue Jardiance 10 - Continue ivabradine - Due for repeat echo - Can repeat CPX test as needed  2. Palpitations/Frequent PVCs - Holter monitor 03/2017 with PVC burden <1%. - Zio 9/21 PVCs 1.3% (monomorphic) - PVCs suppressed with mexilitene 150 bid.Of amio - Stable no PVCs on ECG - No change  3. Mitral Regurgitation - Mild on cMRI 07/07/2017 and echo - stable  4. Pulmonary nodule - Stable on f/u CT 11/18. No change.   5. Aortic stenosis - echo 10/20 AoV mean 15 AVA 0.99 (mild to moderate AS) - echo 8/21 Mild to moderate aortic valve stenosis. AVA 1.48 cm. Aortic valve mean gradient measures 18 mmHg. - Echo  08/21/21: EF 45-50% (I thought EF 40-45%) with LBBB dyssynchrony. Mild-mod AS mean gradient 13 AVA 1.01 cm2  - remains mild to moderate on echo and exam - needs repeat echo   6. Hyperlipidemia - now on Crestor   Glori Bickers, MD  2:46 PM

## 2022-11-02 ENCOUNTER — Other Ambulatory Visit (HOSPITAL_COMMUNITY): Payer: Self-pay | Admitting: Internal Medicine

## 2022-11-02 ENCOUNTER — Other Ambulatory Visit (HOSPITAL_COMMUNITY): Payer: Self-pay

## 2022-11-02 MED ORDER — CARVEDILOL 3.125 MG PO TABS
3.1250 mg | ORAL_TABLET | Freq: Two times a day (BID) | ORAL | 3 refills | Status: DC
  Filled 2022-11-02: qty 60, 30d supply, fill #0
  Filled 2022-12-12: qty 60, 30d supply, fill #1
  Filled 2023-01-22: qty 60, 30d supply, fill #2
  Filled 2023-03-17: qty 60, 30d supply, fill #3

## 2022-11-02 MED ORDER — IVABRADINE HCL 5 MG PO TABS
5.0000 mg | ORAL_TABLET | Freq: Two times a day (BID) | ORAL | 3 refills | Status: DC
  Filled 2022-11-02: qty 60, 30d supply, fill #0
  Filled 2022-12-12: qty 60, 30d supply, fill #1
  Filled 2023-01-22: qty 60, 30d supply, fill #2
  Filled 2023-02-25: qty 60, 30d supply, fill #3

## 2022-11-06 ENCOUNTER — Emergency Department (HOSPITAL_COMMUNITY): Payer: PPO

## 2022-11-06 ENCOUNTER — Encounter (HOSPITAL_COMMUNITY): Payer: Self-pay

## 2022-11-06 ENCOUNTER — Other Ambulatory Visit: Payer: Self-pay

## 2022-11-06 ENCOUNTER — Inpatient Hospital Stay (HOSPITAL_COMMUNITY)
Admission: EM | Admit: 2022-11-06 | Discharge: 2022-11-07 | DRG: 280 | Disposition: A | Discharge status: Home / Self Care | Payer: PPO | Attending: Cardiology | Admitting: Cardiology

## 2022-11-06 ENCOUNTER — Ambulatory Visit (HOSPITAL_COMMUNITY): Admission: RE | Admit: 2022-11-06 | Payer: PPO | Source: Ambulatory Visit

## 2022-11-06 DIAGNOSIS — I214 Non-ST elevation (NSTEMI) myocardial infarction: Secondary | ICD-10-CM | POA: Diagnosis not present

## 2022-11-06 DIAGNOSIS — I5023 Acute on chronic systolic (congestive) heart failure: Secondary | ICD-10-CM | POA: Diagnosis not present

## 2022-11-06 DIAGNOSIS — Z9221 Personal history of antineoplastic chemotherapy: Secondary | ICD-10-CM

## 2022-11-06 DIAGNOSIS — Z801 Family history of malignant neoplasm of trachea, bronchus and lung: Secondary | ICD-10-CM | POA: Diagnosis not present

## 2022-11-06 DIAGNOSIS — R0602 Shortness of breath: Secondary | ICD-10-CM | POA: Diagnosis not present

## 2022-11-06 DIAGNOSIS — Z808 Family history of malignant neoplasm of other organs or systems: Secondary | ICD-10-CM | POA: Diagnosis not present

## 2022-11-06 DIAGNOSIS — E876 Hypokalemia: Secondary | ICD-10-CM | POA: Diagnosis present

## 2022-11-06 DIAGNOSIS — I471 Supraventricular tachycardia, unspecified: Principal | ICD-10-CM | POA: Diagnosis present

## 2022-11-06 DIAGNOSIS — I350 Nonrheumatic aortic (valve) stenosis: Secondary | ICD-10-CM | POA: Diagnosis present

## 2022-11-06 DIAGNOSIS — Z8249 Family history of ischemic heart disease and other diseases of the circulatory system: Secondary | ICD-10-CM | POA: Diagnosis not present

## 2022-11-06 DIAGNOSIS — R0789 Other chest pain: Secondary | ICD-10-CM | POA: Diagnosis not present

## 2022-11-06 DIAGNOSIS — Z803 Family history of malignant neoplasm of breast: Secondary | ICD-10-CM | POA: Diagnosis not present

## 2022-11-06 DIAGNOSIS — I447 Left bundle-branch block, unspecified: Secondary | ICD-10-CM | POA: Diagnosis not present

## 2022-11-06 DIAGNOSIS — Z7984 Long term (current) use of oral hypoglycemic drugs: Secondary | ICD-10-CM

## 2022-11-06 DIAGNOSIS — I110 Hypertensive heart disease with heart failure: Secondary | ICD-10-CM | POA: Diagnosis not present

## 2022-11-06 DIAGNOSIS — Z886 Allergy status to analgesic agent status: Secondary | ICD-10-CM | POA: Diagnosis not present

## 2022-11-06 DIAGNOSIS — I21A1 Myocardial infarction type 2: Secondary | ICD-10-CM | POA: Diagnosis present

## 2022-11-06 DIAGNOSIS — G620 Drug-induced polyneuropathy: Secondary | ICD-10-CM | POA: Diagnosis present

## 2022-11-06 DIAGNOSIS — Z923 Personal history of irradiation: Secondary | ICD-10-CM

## 2022-11-06 DIAGNOSIS — Z79899 Other long term (current) drug therapy: Secondary | ICD-10-CM

## 2022-11-06 DIAGNOSIS — R079 Chest pain, unspecified: Secondary | ICD-10-CM | POA: Diagnosis not present

## 2022-11-06 DIAGNOSIS — Z888 Allergy status to other drugs, medicaments and biological substances status: Secondary | ICD-10-CM

## 2022-11-06 DIAGNOSIS — I428 Other cardiomyopathies: Secondary | ICD-10-CM | POA: Diagnosis present

## 2022-11-06 DIAGNOSIS — Z853 Personal history of malignant neoplasm of breast: Secondary | ICD-10-CM

## 2022-11-06 DIAGNOSIS — Z87891 Personal history of nicotine dependence: Secondary | ICD-10-CM

## 2022-11-06 DIAGNOSIS — Z885 Allergy status to narcotic agent status: Secondary | ICD-10-CM

## 2022-11-06 DIAGNOSIS — I5022 Chronic systolic (congestive) heart failure: Secondary | ICD-10-CM | POA: Diagnosis not present

## 2022-11-06 DIAGNOSIS — G4730 Sleep apnea, unspecified: Secondary | ICD-10-CM | POA: Diagnosis present

## 2022-11-06 DIAGNOSIS — I959 Hypotension, unspecified: Secondary | ICD-10-CM | POA: Diagnosis present

## 2022-11-06 DIAGNOSIS — E785 Hyperlipidemia, unspecified: Secondary | ICD-10-CM | POA: Diagnosis not present

## 2022-11-06 DIAGNOSIS — I2510 Atherosclerotic heart disease of native coronary artery without angina pectoris: Secondary | ICD-10-CM | POA: Diagnosis present

## 2022-11-06 DIAGNOSIS — I445 Left posterior fascicular block: Secondary | ICD-10-CM | POA: Diagnosis present

## 2022-11-06 DIAGNOSIS — T451X5A Adverse effect of antineoplastic and immunosuppressive drugs, initial encounter: Secondary | ICD-10-CM | POA: Diagnosis present

## 2022-11-06 DIAGNOSIS — R002 Palpitations: Secondary | ICD-10-CM | POA: Diagnosis present

## 2022-11-06 LAB — CBC
HCT: 39.5 % (ref 36.0–46.0)
Hemoglobin: 13.5 g/dL (ref 12.0–15.0)
MCH: 31.2 pg (ref 26.0–34.0)
MCHC: 34.2 g/dL (ref 30.0–36.0)
MCV: 91.2 fL (ref 80.0–100.0)
Platelets: 185 10*3/uL (ref 150–400)
RBC: 4.33 MIL/uL (ref 3.87–5.11)
RDW: 13.2 % (ref 11.5–15.5)
WBC: 4.1 10*3/uL (ref 4.0–10.5)
nRBC: 0 % (ref 0.0–0.2)

## 2022-11-06 LAB — COMPREHENSIVE METABOLIC PANEL
ALT: 40 U/L (ref 0–44)
AST: 39 U/L (ref 15–41)
Albumin: 3.5 g/dL (ref 3.5–5.0)
Alkaline Phosphatase: 91 U/L (ref 38–126)
Anion gap: 13 (ref 5–15)
BUN: 15 mg/dL (ref 8–23)
CO2: 21 mmol/L — ABNORMAL LOW (ref 22–32)
Calcium: 8.9 mg/dL (ref 8.9–10.3)
Chloride: 103 mmol/L (ref 98–111)
Creatinine, Ser: 0.88 mg/dL (ref 0.44–1.00)
GFR, Estimated: >60 mL/min (ref >60)
Glucose, Bld: 165 mg/dL — ABNORMAL HIGH (ref 70–99)
Potassium: 2.9 mmol/L — ABNORMAL LOW (ref 3.5–5.1)
Sodium: 137 mmol/L (ref 135–145)
Total Bilirubin: 0.4 mg/dL (ref 0.3–1.2)
Total Protein: 6.2 g/dL — ABNORMAL LOW (ref 6.5–8.1)

## 2022-11-06 LAB — CREATININE, SERUM
Creatinine, Ser: 0.62 mg/dL (ref 0.44–1.00)
GFR, Estimated: >60 mL/min (ref >60)

## 2022-11-06 LAB — BASIC METABOLIC PANEL
Anion gap: 12 (ref 5–15)
BUN: 11 mg/dL (ref 8–23)
CO2: 20 mmol/L — ABNORMAL LOW (ref 22–32)
Calcium: 8.4 mg/dL — ABNORMAL LOW (ref 8.9–10.3)
Chloride: 109 mmol/L (ref 98–111)
Creatinine, Ser: 0.54 mg/dL (ref 0.44–1.00)
GFR, Estimated: >60 mL/min (ref >60)
Glucose, Bld: 96 mg/dL (ref 70–99)
Potassium: 3.2 mmol/L — ABNORMAL LOW (ref 3.5–5.1)
Sodium: 141 mmol/L (ref 135–145)

## 2022-11-06 LAB — CBC WITH DIFFERENTIAL/PLATELET
Abs Immature Granulocytes: 0.01 10*3/uL (ref 0.00–0.07)
Basophils Absolute: 0 10*3/uL (ref 0.0–0.1)
Basophils Relative: 1 %
Eosinophils Absolute: 0.2 10*3/uL (ref 0.0–0.5)
Eosinophils Relative: 3 %
HCT: 43.4 % (ref 36.0–46.0)
Hemoglobin: 14.5 g/dL (ref 12.0–15.0)
Immature Granulocytes: 0 %
Lymphocytes Relative: 29 %
Lymphs Abs: 1.5 10*3/uL (ref 0.7–4.0)
MCH: 30.9 pg (ref 26.0–34.0)
MCHC: 33.4 g/dL (ref 30.0–36.0)
MCV: 92.3 fL (ref 80.0–100.0)
Monocytes Absolute: 0.7 10*3/uL (ref 0.1–1.0)
Monocytes Relative: 13 %
Neutro Abs: 2.9 10*3/uL (ref 1.7–7.7)
Neutrophils Relative %: 54 %
Platelets: 193 10*3/uL (ref 150–400)
RBC: 4.7 MIL/uL (ref 3.87–5.11)
RDW: 13.2 % (ref 11.5–15.5)
Smear Review: NORMAL
WBC: 5.3 10*3/uL (ref 4.0–10.5)
nRBC: 0 % (ref 0.0–0.2)

## 2022-11-06 LAB — TROPONIN I (HIGH SENSITIVITY)
Troponin I (High Sensitivity): 26 ng/L — ABNORMAL HIGH (ref <18)
Troponin I (High Sensitivity): 484 ng/L (ref <18)
Troponin I (High Sensitivity): 1087 ng/L (ref <18)
Troponin I (High Sensitivity): 1069 ng/L (ref <18)

## 2022-11-06 LAB — ECHOCARDIOGRAM COMPLETE
AR max vel: 0.87 cm2
AV Area VTI: 0.79 cm2
AV Area mean vel: 0.79 cm2
AV Mean grad: 18 mmHg
AV Peak grad: 31.1 mmHg
Ao pk vel: 2.79 m/s
Area-P 1/2: 3.6 cm2
MV M vel: 4.29 m/s
MV Peak grad: 73.6 mmHg
S' Lateral: 4.2 cm
Single Plane A4C EF: 32.9 %

## 2022-11-06 LAB — LACTIC ACID, PLASMA
Lactic Acid, Venous: 1 mmol/L (ref 0.5–1.9)
Lactic Acid, Venous: 2.3 mmol/L (ref 0.5–1.9)

## 2022-11-06 LAB — HEMOGLOBIN A1C
Hgb A1c MFr Bld: 5.3 % (ref 4.8–5.6)
Mean Plasma Glucose: 105.41 mg/dL

## 2022-11-06 LAB — PROTIME-INR
INR: 1.2 (ref 0.8–1.2)
Prothrombin Time: 14.7 seconds (ref 11.4–15.2)

## 2022-11-06 LAB — BRAIN NATRIURETIC PEPTIDE: B Natriuretic Peptide: 196.1 pg/mL — ABNORMAL HIGH (ref 0.0–100.0)

## 2022-11-06 LAB — TSH: TSH: 3.826 u[IU]/mL (ref 0.350–4.500)

## 2022-11-06 LAB — MAGNESIUM: Magnesium: 1.8 mg/dL (ref 1.7–2.4)

## 2022-11-06 MED ORDER — SODIUM CHLORIDE 0.9 % IV SOLN: INTRAVENOUS | Status: DC

## 2022-11-06 MED ORDER — ACETAMINOPHEN 325 MG PO TABS: 650.0000 mg | ORAL_TABLET | ORAL | Status: DC | PRN

## 2022-11-06 MED ORDER — MEXILETINE HCL 150 MG PO CAPS
150.0000 mg | ORAL_CAPSULE | Freq: Two times a day (BID) | ORAL | Status: DC
  Administered 2022-11-06 – 2022-11-07 (×2): 150 mg via ORAL
  Filled 2022-11-06 (×3): qty 1

## 2022-11-06 MED ORDER — MAGNESIUM SULFATE IN D5W 1-5 GM/100ML-% IV SOLN
1.0000 g | Freq: Once | INTRAVENOUS | Status: AC
  Administered 2022-11-06: 1 g via INTRAVENOUS
  Filled 2022-11-06: qty 100

## 2022-11-06 MED ORDER — IVABRADINE HCL 5 MG PO TABS
5.0000 mg | ORAL_TABLET | Freq: Two times a day (BID) | ORAL | Status: DC
  Administered 2022-11-06 – 2022-11-07 (×2): 5 mg via ORAL
  Filled 2022-11-06 (×4): qty 1

## 2022-11-06 MED ORDER — POTASSIUM CHLORIDE CRYS ER 20 MEQ PO TBCR
40.0000 meq | EXTENDED_RELEASE_TABLET | Freq: Two times a day (BID) | ORAL | Status: AC
  Administered 2022-11-06 (×2): 40 meq via ORAL
  Filled 2022-11-06 (×2): qty 2

## 2022-11-06 MED ORDER — EMPAGLIFLOZIN 10 MG PO TABS
10.0000 mg | ORAL_TABLET | Freq: Every day | ORAL | Status: DC
  Administered 2022-11-07: 10 mg via ORAL
  Filled 2022-11-06: qty 1

## 2022-11-06 MED ORDER — ROSUVASTATIN CALCIUM 5 MG PO TABS
10.0000 mg | ORAL_TABLET | Freq: Every day | ORAL | Status: DC
  Administered 2022-11-06 – 2022-11-07 (×2): 10 mg via ORAL
  Filled 2022-11-06 (×2): qty 2

## 2022-11-06 MED ORDER — ONDANSETRON HCL 4 MG/2ML IJ SOLN: 4.0000 mg | Freq: Four times a day (QID) | INTRAMUSCULAR | Status: DC | PRN

## 2022-11-06 MED ORDER — POTASSIUM CHLORIDE 10 MEQ/100ML IV SOLN
10.0000 meq | Freq: Once | INTRAVENOUS | Status: AC
  Administered 2022-11-06: 10 meq via INTRAVENOUS
  Filled 2022-11-06: qty 100

## 2022-11-06 MED ORDER — SODIUM CHLORIDE 0.9% FLUSH
3.0000 mL | Freq: Two times a day (BID) | INTRAVENOUS | Status: DC
  Administered 2022-11-06: 3 mL via INTRAVENOUS

## 2022-11-06 MED ORDER — ASPIRIN 81 MG PO TBEC
81.0000 mg | DELAYED_RELEASE_TABLET | Freq: Every day | ORAL | Status: DC
  Filled 2022-11-06: qty 1

## 2022-11-06 MED ORDER — SODIUM CHLORIDE 0.9% FLUSH: 3.0000 mL | INTRAVENOUS | Status: DC | PRN

## 2022-11-06 MED ORDER — ADENOSINE 6 MG/2ML IV SOLN
INTRAVENOUS | Status: AC
  Administered 2022-11-06: 12 mg
  Filled 2022-11-06: qty 10

## 2022-11-06 MED ORDER — SODIUM CHLORIDE 0.9 % IV SOLN: 250.0000 mL | INTRAVENOUS | Status: DC | PRN

## 2022-11-06 MED ORDER — HEPARIN SODIUM (PORCINE) 5000 UNIT/ML IJ SOLN
5000.0000 [IU] | Freq: Three times a day (TID) | INTRAMUSCULAR | Status: DC
  Administered 2022-11-06 – 2022-11-07 (×3): 5000 [IU] via SUBCUTANEOUS
  Filled 2022-11-06 (×3): qty 1

## 2022-11-06 MED ORDER — ASPIRIN 81 MG PO CHEW
81.0000 mg | CHEWABLE_TABLET | ORAL | Status: AC
  Administered 2022-11-07: 81 mg via ORAL
  Filled 2022-11-06: qty 1

## 2022-11-06 MED ORDER — NITROGLYCERIN 0.4 MG SL SUBL: 0.4000 mg | SUBLINGUAL_TABLET | SUBLINGUAL | Status: DC | PRN

## 2022-11-06 NOTE — ED Notes (Signed)
BP remains soft, KCL infusing with NS dilution. KCL slowed for toleration d/t burning at IV site. Will continue to monitor. Denies sx or complaints.

## 2022-11-06 NOTE — ED Notes (Signed)
ED TO INPATIENT HANDOFF REPORT  ED Nurse Name and Phone #:   S Name/Age/Gender Patricia Collier 77 y.o. female Room/Bed: 038C/038C  Code Status   Code Status: Full Code  Home/SNF/Other Home Patient oriented to: self, place, time, and situation Is this baseline? Yes   Triage Complete: Triage complete  Chief Complaint NSTEMI (non-ST elevated myocardial infarction) [facility]) [I21.4]  Triage Note Patient reports central chest pain with palpitations /SOB this morning , her cardiologist is Dr. Clarise Cruz , history of CHF/SVT .    Allergies Allergies  Allergen Reactions   Effexor [Venlafaxine] Other (See Comments)    Caused depression, negative thoughts, and crying    Hydrocodone-Acetaminophen Itching and Nausea And Vomiting    Patient can tolerate with Zofran    Level of Care/Admitting Diagnosis ED Disposition     ED Disposition  Admit   Condition  --   Comment  Hospital Area: [facility] [[id number]]  Level of Care: Telemetry Cardiac [[id number]]  May admit patient to [facility] or [facility] if equivalent level of care is available:: No  Covid Evaluation: Asymptomatic - no recent exposure (last 10 days) testing not required  Diagnosis: NSTEMI (non-ST elevated myocardial infarction) [facility]) [[id number]]  Admitting Physician: Jake Bathe [[id number]]  Attending Physician: Jake Bathe [[id number]]  Certification:: I certify this patient will need inpatient services for at least 2 midnights  Estimated Length of Stay: 2          B Medical/Surgery History Past Medical History:  Diagnosis Date   Arthritis    ostearthritis -joints, greater left knee.   Breast cancer [facility])    Breast cancer, left breast (HCC) 2009   S/P surgery, radiation, chemotherapy.-no further oncology visits.   CHF (congestive heart failure) (HCC) dx'd 02/05/2017   Hyperlipidemia    Hypertension    Neuromuscular disorder (HCC)    neuropathy due to chemotherapy   Personal history of chemotherapy  2009   Personal history of radiation therapy 2009   Seizures (HCC)    "childbirth related"   Sleep apnea    mild no C-PAP   SVT (supraventricular tachycardia)    x1 episode -3 yrs ago- no issues since.All heart testing negative. No further cardiac visits needed.   Past Surgical History:  Procedure Laterality Date   ABDOMINAL HYSTERECTOMY  1984   BILATERAL SALPINGOOPHORECTOMY Bilateral 2000s   BREAST BIOPSY Left 2009   BREAST LUMPECTOMY Left 2009   sentinel node dissection   BUNIONECTOMY Bilateral early 2000s   CARDIAC CATHETERIZATION  2015   DIAGNOSTIC LAPAROSCOPY  1990s-early 2000s   multiple for adhesions   DILATION AND CURETTAGE OF UTERUS  2000   w/laparoscopies   HAMMER TOE SURGERY Right 09/2015; 06/22/2016; 12/28/2016   2nd digit   KNEE ARTHROSCOPY Left 09/21/2015   Procedure: LEFT ARTHROSCOPY KNEE WITH LATERAL MENISCUS DEBRIDEMENT;  Surgeon: Ollen Gross, MD;  Location: [facility];  Service: Orthopedics;  Laterality: Left;   KNEE ARTHROSCOPY Right 10/09/2021   Procedure: Right knee arthroscopy; lateral meniscal debridement;  Surgeon: Ollen Gross, MD;  Location: [facility];  Service: Orthopedics;  Laterality: Right;   KNEE SURGERY  10/09/2021   Right knee   LAPAROSCOPIC CHOLECYSTECTOMY     RHINOPLASTY  1979   RIGHT/LEFT HEART CATH AND CORONARY ANGIOGRAPHY N/A 02/06/2017   Procedure: Right/Left Heart Cath and Coronary Angiography;  Surgeon: Dolores Patty, MD;  Location: [facility] INVASIVE CV LAB;  Service: Cardiovascular;  Laterality: N/A;   TONSILLECTOMY AND ADENOIDECTOMY  1955  TRANSANAL RECTOPEXY  1990s     A IV Location/Drains/Wounds Patient Lines/Drains/Airways Status     Active Line/Drains/Airways     Name Placement date Placement time Site Days   Peripheral IV 11/06/22 18 G Left Antecubital 11/06/22  0409  Antecubital  less than 1   Peripheral IV 11/06/22 20 G Right Antecubital 11/06/22  0727  Antecubital  less than 1            Intake/Output Last 24  hours  Intake/Output Summary (Last 24 hours) at 11/06/2022 1356 Last data filed at 11/06/2022 1317 Gross per 24 hour  Intake 1325 ml  Output 775 ml  Net 550 ml    Labs/Imaging Results for orders placed or performed during the hospital encounter of 11/06/22 (from the past 48 hour(s))  CBC with Differential     Status: None   Collection Time: 11/06/22  3:44 AM  Result Value Ref Range   WBC 5.3 4.0 - 10.5 K/uL   RBC 4.70 3.87 - 5.11 MIL/uL   Hemoglobin 14.5 12.0 - 15.0 g/dL   HCT 16.1 09.6 - 04.5 %   MCV 92.3 80.0 - 100.0 fL   MCH 30.9 26.0 - 34.0 pg   MCHC 33.4 30.0 - 36.0 g/dL   RDW 40.9 81.1 - 91.4 %   Platelets 193 150 - 400 K/uL   nRBC 0.0 0.0 - 0.2 %   Neutrophils Relative % 54 %   Neutro Abs 2.9 1.7 - 7.7 K/uL   Lymphocytes Relative 29 %   Lymphs Abs 1.5 0.7 - 4.0 K/uL   Monocytes Relative 13 %   Monocytes Absolute 0.7 0.1 - 1.0 K/uL   Eosinophils Relative 3 %   Eosinophils Absolute 0.2 0.0 - 0.5 K/uL   Basophils Relative 1 %   Basophils Absolute 0.0 0.0 - 0.1 K/uL   WBC Morphology MORPHOLOGY UNREMARKABLE    RBC Morphology MORPHOLOGY UNREMARKABLE    Smear Review Normal platelet morphology    Immature Granulocytes 0 %   Abs Immature Granulocytes 0.01 0.00 - 0.07 K/uL    Comment: Performed at [facility], [address], [place], [place] [address]  Comprehensive metabolic panel     Status: Abnormal   Collection Time: 11/06/22  3:44 AM  Result Value Ref Range   Sodium 137 135 - 145 mmol/L   Potassium 2.9 (L) 3.5 - 5.1 mmol/L   Chloride 103 98 - 111 mmol/L   CO2 21 (L) 22 - 32 mmol/L   Glucose, Bld 165 (H) 70 - 99 mg/dL    Comment: Glucose reference range applies only to samples taken after fasting for at least 8 hours.   BUN 15 8 - 23 mg/dL   Creatinine, Ser 6.21 0.44 - 1.00 mg/dL   Calcium 8.9 8.9 - 30.8 mg/dL   Total Protein 6.2 (L) 6.5 - 8.1 g/dL   Albumin 3.5 3.5 - 5.0 g/dL   AST 39 15 - 41 U/L   ALT 40 0 - 44 U/L   Alkaline Phosphatase 91 38 - 126  U/L   Total Bilirubin 0.4 0.3 - 1.2 mg/dL   GFR, Estimated >65 >78 mL/min    Comment: (NOTE) Calculated using the CKD-EPI Creatinine Equation (2021)    Anion gap 13 5 - 15    Comment: Performed at [facility], [address] [place], [place] [address]  Troponin I (High Sensitivity)     Status: Abnormal   Collection Time: 11/06/22  3:44 AM  Result Value  Ref Range   Troponin I (High Sensitivity) 26 (H) <18 ng/L    Comment: (NOTE) Elevated high sensitivity troponin I (hsTnI) values and significant  changes across serial measurements may suggest ACS but many other  chronic and acute conditions are known to elevate hsTnI results.  Refer to the "Links" section for chest pain algorithms and additional  guidance. Performed at [facility], [address] [place], [place] [address]   Brain natriuretic peptide     Status: Abnormal   Collection Time: 11/06/22  3:44 AM  Result Value Ref Range   B Natriuretic Peptide 196.1 (H) 0.0 - 100.0 pg/mL    Comment: Performed at [facility], [address], [place], [place] [address]  Protime-INR     Status: None   Collection Time: 11/06/22  3:44 AM  Result Value Ref Range   Prothrombin Time 14.7 11.4 - 15.2 seconds   INR 1.2 0.8 - 1.2    Comment: (NOTE) INR goal varies based on device and disease states. Performed at [facility], [address], [place], [place] [address]   Magnesium     Status: None   Collection Time: 11/06/22  3:44 AM  Result Value Ref Range   Magnesium 1.8 1.7 - 2.4 mg/dL    Comment: Performed at [facility], [address], [place], [place] [address]  Troponin I (High Sensitivity)     Status: Abnormal   Collection Time: 11/06/22  6:06 AM  Result Value Ref Range   Troponin I (High Sensitivity) 484 (HH) <18 ng/L    Comment: CRITICAL RESULT CALLED TO, READ BACK BY AND VERIFIED WITH K.GIBSON,RN 0708 11/06/22 CLARK,S (NOTE) Elevated high sensitivity troponin I (hsTnI) values and significant   changes across serial measurements may suggest ACS but many other  chronic and acute conditions are known to elevate hsTnI results.  Refer to the "Links" section for chest pain algorithms and additional  guidance. Performed at [facility], [address] [place], [place] [address]   Troponin I (High Sensitivity)     Status: Abnormal   Collection Time: 11/06/22  8:30 AM  Result Value Ref Range   Troponin I (High Sensitivity) 1,087 (HH) <18 ng/L    Comment: CRITICAL VALUE NOTED. VALUE IS CONSISTENT WITH PREVIOUSLY REPORTED/CALLED VALUE (NOTE) Elevated high sensitivity troponin I (hsTnI) values and significant  changes across serial measurements may suggest ACS but many other  chronic and acute conditions are known to elevate hsTnI results.  Refer to the "Links" section for chest pain algorithms and additional  guidance. Performed at [facility] Lab, [address], [place], [place] [address]   Troponin I (High Sensitivity)     Status: Abnormal   Collection Time: 11/06/22 10:28 AM  Result Value Ref Range   Troponin I (High Sensitivity) 1,069 (HH) <18 ng/L    Comment: CRITICAL VALUE NOTED. VALUE IS CONSISTENT WITH PREVIOUSLY REPORTED/CALLED VALUE (NOTE) Elevated high sensitivity troponin I (hsTnI) values and significant  changes across serial measurements may suggest ACS but many other  chronic and acute conditions are known to elevate hsTnI results.  Refer to the "Links" section for chest pain algorithms and additional  guidance. Performed at [facility], [address], [place], [place] [address]   Basic metabolic panel     Status: Abnormal   Collection Time: 11/06/22 10:28 AM  Result Value Ref Range   Sodium 141 135 - 145 mmol/L   Potassium 3.2 (  L) 3.5 - 5.1 mmol/L   Chloride 109 98 - 111 mmol/L   CO2 20 (L) 22 - 32 mmol/L   Glucose, Bld 96 70 - 99 mg/dL    Comment: Glucose reference range applies only to samples taken after fasting for at least 8 hours.    BUN 11 8 - 23 mg/dL   Creatinine, Ser 3.24 0.44 - 1.00 mg/dL   Calcium 8.4 (L) 8.9 - 10.3 mg/dL   GFR, Estimated >40 >10 mL/min    Comment: (NOTE) Calculated using the CKD-EPI Creatinine Equation (2021)    Anion gap 12 5 - 15    Comment: Performed at [facility], [address], [place], [place] [address]  Lactic acid, plasma     Status: None   Collection Time: 11/06/22 11:31 AM  Result Value Ref Range   Lactic Acid, Venous 1.0 0.5 - 1.9 mmol/L    Comment: Performed at [facility], [address], [place], [place] [address]   DG CHEST PORT 1 VIEW  Result Date: 11/06/2022 CLINICAL DATA:  Chest pain EXAM: PORTABLE CHEST 1 VIEW COMPARISON:  02/04/2020 FINDINGS: The heart size and mediastinal contours are within normal limits. Both lungs are clear. The visualized skeletal structures are unremarkable. IMPRESSION: No active disease. Electronically Signed   By: Alcide Clever M.D.   On: 11/06/2022 03:58    Pending Labs Unresulted Labs (From admission, onward)     Start     Ordered   11/07/22 0500  Basic metabolic panel  Tomorrow morning,   R        11/06/22 1332   11/07/22 0500  CBC  Tomorrow morning,   R        11/06/22 1332   11/07/22 0500  Lipid panel  Tomorrow morning,   R        11/06/22 1332   11/06/22 1333  Hemoglobin A1c  Once,   R        11/06/22 1332   11/06/22 1332  CBC  (heparin)  Once,   R       Comments: Baseline for heparin therapy IF NOT ALREADY DRAWN.  Notify MD if PLT < 100 K.    11/06/22 1332   11/06/22 1332  Creatinine, serum  (heparin)  Once,   R       Comments: Baseline for heparin therapy IF NOT ALREADY DRAWN.    11/06/22 1332   11/06/22 1131  Lactic acid, plasma  STAT Now then every 3 hours,   R (with STAT occurrences)      11/06/22 1131            Vitals/Pain Today's Vitals   11/06/22 1219 11/06/22 1230 11/06/22 1346 11/06/22 1347  BP:  (!) 80/44    Pulse:  74 80 81  Resp:   19 (!) 22  Temp: 97.9 F (36.6 C)     TempSrc: Oral      SpO2:  100% 99% 98%  PainSc:        Isolation Precautions No active isolations  Medications Medications  potassium chloride SA (KLOR-CON M) CR tablet 40 mEq (40 mEq Oral Given 11/06/22 1314)  magnesium sulfate IVPB 1 g 100 mL (1 g Intravenous New Bag/Given 11/06/22 1313)  aspirin EC tablet 81 mg (has no administration in time range)  nitroGLYCERIN (NITROSTAT) SL tablet 0.4 mg (has no administration in time range)  acetaminophen (TYLENOL) tablet 650 mg (has no administration in time range)  ondansetron (ZOFRAN) injection 4  mg (has no administration in time range)  heparin injection 5,000 Units (has no administration in time range)  rosuvastatin (CRESTOR) tablet 10 mg (has no administration in time range)  empagliflozin (JARDIANCE) tablet 10 mg (has no administration in time range)  adenosine (ADENOCARD) 6 MG/2ML injection (12 mg  Given 11/06/22 0413)  potassium chloride 10 mEq in 100 mL IVPB (0 mEq Intravenous Stopped 11/06/22 0603)  potassium chloride 10 mEq in 100 mL IVPB (0 mEq Intravenous Stopped 11/06/22 1049)    Mobility walks     Focused Assessments Cardiac Assessment Handoff:  Cardiac Rhythm: Normal sinus rhythm (HR 78) Lab Results  Component Value Date   TROPONINI <0.03 02/05/2017   No results found for: "DDIMER" Does the Patient currently have chest pain? No    R Recommendations: See Admitting Provider Note  Report given to:   Additional Notes:

## 2022-11-06 NOTE — ED Notes (Signed)
No changes. Up to [facility]. BMP added to recent tube sent down.

## 2022-11-06 NOTE — ED Provider Notes (Addendum)
[place] EMERGENCY DEPARTMENT AT [facility] Provider Note  CSN: [id number] Arrival date & time: 11/06/22 4098  Chief Complaint(s) Chest Pain and Palpitations  HPI Patricia Collier is a 77 y.o. female with a past medical history listed below including chronic CHF with a last EF of 45 to 50%, SVT, who presents to the for rapid heart rate noted to be in SVT.  Onset was 1 hour ago.  She reports feeling chest tightness and shortness of breath.  She noted right arm nubness. Patient tried numerous vagal maneuvers at home without success.  Patient denies any recent fevers or infections.  No nausea or vomiting.  No abdominal pain.   No other physical complaints.  The history is provided by the patient.    Past Medical History Past Medical History:  Diagnosis Date   Arthritis    ostearthritis -joints, greater left knee.   Breast cancer [facility])    Breast cancer, left breast (HCC) 2009   S/P surgery, radiation, chemotherapy.-no further oncology visits.   CHF (congestive heart failure) (HCC) dx'd 02/05/2017   Hyperlipidemia    Hypertension    Neuromuscular disorder (HCC)    neuropathy due to chemotherapy   Personal history of chemotherapy 2009   Personal history of radiation therapy 2009   Seizures (HCC)    "childbirth related"   Sleep apnea    mild no C-PAP   SVT (supraventricular tachycardia)    x1 episode -3 yrs ago- no issues since.All heart testing negative. No further cardiac visits needed.   Patient Active Problem List   Diagnosis Date Noted   Overweight (BMI 25.0-29.9) 06/13/2021   Palpitations 05/09/2017   Mitral regurgitation 05/09/2017   Cardiomegaly 02/04/2017   Pulmonary nodule 02/04/2017   CHF (congestive heart failure) (HCC) 02/04/2017   Physical exam 01/03/2017   Lateral meniscus tear, current 09/21/2015   Abnormal EKG 05/28/2012   History of left breast cancer 11/19/2011   Hyperlipidemia 01/23/2007   Home Medication(s) Prior to Admission medications    Medication Sig Start Date End Date Taking? Authorizing Provider  carvedilol (COREG) 3.125 MG tablet Take 1 tablet (3.125 mg total) by mouth 2 (two) times daily. 11/02/22   Bensimhon, Bevelyn Buckles, MD  Coenzyme Q10 (COQ10) 100 MG CAPS  08/10/20   [provider]  empagliflozin (JARDIANCE) 10 MG TABS tablet Take 1 tablet (10 mg total) by mouth daily before breakfast. 05/25/22   Bensimhon, Bevelyn Buckles, MD  ivabradine (CORLANOR) 5 MG TABS tablet Take 1 tablet (5 mg total) by mouth 2 (two) times daily with a meal. 11/02/22   Bensimhon, Bevelyn Buckles, MD  losartan (COZAAR) 25 MG tablet Take 1 tablet (25 mg total) by mouth daily. 10/09/22   Bensimhon, Bevelyn Buckles, MD  Magnesium 250 MG TABS Take 250 mg by mouth daily.    [provider]  mexiletine (MEXITIL) 150 MG capsule TAKE 1 CAPSULE BY MOUTH 2 TIMES DAILY. 11/17/21 11/26/22  Bensimhon, Bevelyn Buckles, MD  ondansetron (ZOFRAN) 4 MG tablet Take 1 tablet (4 mg total) by mouth every 8 (eight) hours as needed for nausea or vomiting. 10/09/22   Sheliah Hatch, MD  rosuvastatin (CRESTOR) 10 MG tablet Take 1 tablet (10 mg total) by mouth at bedtime. 06/27/22   Sheliah Hatch, MD  spironolactone (ALDACTONE) 25 MG tablet Take 0.5 tablets (12.5 mg total) by mouth daily. 03/26/22   Bensimhon, Bevelyn Buckles, MD  zinc gluconate 50 MG tablet Take 50 mg by mouth daily.    [provider]                                                                                                                                    Allergies Effexor [venlafaxine] and Hydrocodone-acetaminophen  Review of Systems Review of Systems As noted in HPI  Physical Exam Vital Signs  I have reviewed the triage vital signs BP 86/73   Pulse (!) 202  Resp 12   SpO2 100%   Physical Exam Vitals reviewed.  Constitutional:      General: She is not in acute distress.    Appearance: She is well-developed. She is not diaphoretic.  HENT:     Head: Normocephalic and atraumatic.     Nose:  Nose normal.  Eyes:     General: No scleral icterus.       Right eye: No discharge.        Left eye: No discharge.     Conjunctiva/sclera: Conjunctivae normal.     Pupils: Pupils are equal, round, and reactive to light.  Cardiovascular:     Rate and Rhythm: Regular rhythm. Tachycardia present.     Heart sounds: No murmur heard.    No friction rub. No gallop.  Pulmonary:     Effort: Pulmonary effort is normal. No respiratory distress.     Breath sounds: Normal breath sounds. No stridor. No rales.  Abdominal:     General: There is no distension.     Palpations: Abdomen is soft.     Tenderness: There is no abdominal tenderness.  Musculoskeletal:        General: No tenderness.     Cervical back: Normal range of motion and neck supple.  Skin:    General: Skin is warm and dry.     Findings: No erythema or rash.  Neurological:     Mental Status: She is alert and oriented to person, place, and time.     ED Results and Treatments Labs (all labs ordered are listed, but only abnormal results are displayed) Labs Reviewed  COMPREHENSIVE METABOLIC PANEL - Abnormal; Notable for the following components:      Result Value   Potassium 2.9 (*)    CO2 21 (*)    Glucose, Bld 165 (*)    Total Protein 6.2 (*)    All other components within normal limits  BRAIN NATRIURETIC PEPTIDE - Abnormal; Notable for the following components:   B Natriuretic Peptide 196.1 (*)    All other components within normal limits  TROPONIN I (HIGH SENSITIVITY) - Abnormal; Notable for the following components:   Troponin I (High Sensitivity) 26 (*)    All other components within normal limits  TROPONIN I (HIGH SENSITIVITY) - Abnormal; Notable for the following components:   Troponin I (High Sensitivity) 484 (*)    All other components within normal limits  CBC WITH DIFFERENTIAL/PLATELET  PROTIME-INR  MAGNESIUM  EKG  EKG Interpretation  Date/Time:  Tuesday November 06 2022 03:47:29 EDT Ventricular Rate:  212 PR Interval:    QRS Duration: 120 QT Interval:  222 QTC Calculation: 416 R Axis:   168 Text Interpretation: Supraventricular tachycardia Incomplete left bundle branch block Left posterior fascicular block Marked ST abnormality, possible inferolateral subendocardial injury Abnormal ECG When compared with ECG of 09-Oct-2022 14:23, PREVIOUS ECG IS PRESENT Confirmed by Drema Pry [phone]) on 11/06/2022 4:42:44 AM        EKG Interpretation  Date/Time:  Tuesday November 06 2022 04:41:08 EDT Ventricular Rate:  94 PR Interval:  165 QRS Duration: 162 QT Interval:  468 QTC Calculation: 586 R Axis:   226 Text Interpretation: Sinus rhythm Probable left atrial enlargement Nonspecific intraventricular conduction delay Repol abnrm suggests ischemia, anterolateral Confirmed by Drema Pry [phone]) on 11/06/2022 4:44:07 AM        Radiology DG CHEST PORT 1 VIEW  Result Date: 11/06/2022 CLINICAL DATA:  Chest pain EXAM: PORTABLE CHEST 1 VIEW COMPARISON:  02/04/2020 FINDINGS: The heart size and mediastinal contours are within normal limits. Both lungs are clear. The visualized skeletal structures are unremarkable. IMPRESSION: No active disease. Electronically Signed   By: Alcide Clever M.D.   On: 11/06/2022 03:58    Medications Ordered in ED Medications  adenosine (ADENOCARD) 6 MG/2ML injection (12 mg  Given 11/06/22 0413)  potassium chloride 10 mEq in 100 mL IVPB (0 mEq Intravenous Stopped 11/06/22 0603)                                                                                                                                     Procedures .1-3 Lead EKG Interpretation  Performed by: Nira Conn, MD Authorized by: Nira Conn, MD   .Cardioversion  Date/Time: 11/06/2022 4:21 AM  Performed by: Nira Conn, MD Authorized by: Nira Conn, MD   Consent:    Consent obtained:  Verbal   Consent given by:  Patient   Risks discussed:  Induced arrhythmia Pre-procedure details:    Cardioversion basis:  Emergent   Rhythm:  Supraventricular tachycardia   Electrode placement:  Anterior-posterior Patient sedated: No Attempt one:    Cardioversion mode attempt one: 12mg  Adenosine. Post-procedure details:    Patient status:  Awake   Patient tolerance of procedure:  Tolerated well, no immediate complications Comments:     Converted to NSR after one 12mg  dose  .Critical Care  Performed by: Nira Conn, MD Authorized by: Nira Conn, MD   Critical care provider statement:    Critical care time (minutes):  65   Critical care was necessary to treat or prevent imminent or life-threatening deterioration of the following conditions:  Cardiac failure and circulatory failure   Critical care was time spent personally by me on the following activities:  Development of treatment plan with patient or surrogate, discussions with consultants, evaluation of patient's response to  treatment, examination of patient, ordering and review of laboratory studies, ordering and review of radiographic studies, ordering and performing treatments and interventions, pulse oximetry, re-evaluation of patient's condition and review of old charts   (including critical care time)  Medical Decision Making / ED Course  Click here for ABCD2, HEART and other calculators  Medical Decision Making Amount and/or Complexity of Data Reviewed Labs: ordered. Radiology: ordered.  Risk Prescription drug management. Decision regarding hospitalization.    This patient presents to the ED for concern of tachycardia, this involves an extensive number of treatment options, and is a complaint that carries with it a high risk of complications and morbidity. The differential diagnosis includes but not limited to:  Presentation is most consistent  with SVT.  No other dysrhythmias noted on telemetry or EKG noted below.  Will need to assess for any electrolyte derangements, evidence of heart failure exacerbation.  Will also assess for anemia.   Patient became hypotensive with systolics in the 80s.  Likely secondary to prolonged SVT.  Initial intervention:  12 mg of adenosine  Work up Interpretation and Management:  Cardiac Monitoring/EKG: EKG with SVT Telemetry with SVT with rates in the 200s Telemetry post cardioversion with sinus tachycardia in the 110s  Laboratory Tests ordered listed below with my independent interpretation: CBC without leukocytosis or anemia Metabolic panel with mild hypokalemia at 2.9.  No other significant electrolyte derangements or renal sufficiency. Mild troponin bump to be expected.  Doubt ACS BNP 196   Imaging Studies ordered listed below with my independent interpretation: Chest x-ray without evidence of pneumonia, pneumothorax, pulmonary edema or pleural effusion    Reassessment: 5:07 AM: Patient no longer symptomatic and heart rate is improving now in the 90s.  Her blood pressure after cardioversion had improved with systolics in the 100s now it has trended down with systolics in the 80s.  We increased rate of IV fluids.  Blood pressure seems to be responding to this.  Will continue to monitor. 6:28 AM patient's blood pressure still soft with systolics in the 80s to 90s.  Consulted cardiology and requested their evaluation of the patient to determine ultimate dispo and to see if they can obtain the scheduled echocardiogram while she is in the emergency department. 7:33 AM Trop trended up to 484. She is chest pain free. BP while sitting up in 70. Again asymptomatic. Improved when laying supine. Patient care turned over to oncoming provider pending cardiology recommendations. Patient case and results discussed in detail; please see their note for further ED managment.         Final Clinical  Impression(s) / ED Diagnoses Final diagnoses:  SVT (supraventricular tachycardia)           This chart was dictated using voice recognition software.  Despite best efforts to proofread,  errors can occur which can change the documentation meaning.        Nira Conn, MD 11/06/22 [phone]

## 2022-11-06 NOTE — ED Notes (Addendum)
Pt resting/ sleeping on side. BP lower. Will monitor. MD into room, at [facility]

## 2022-11-06 NOTE — ED Provider Notes (Signed)
  Physical Exam  BP (!) 88/52   Pulse 82   Temp 97.8 F (36.6 C) (Oral)   Resp 20   SpO2 99%   Physical Exam  Procedures  Procedures  ED Course / MDM    Medical Decision Making Amount and/or Complexity of Data Reviewed Labs: ordered. Radiology: ordered.  Risk Prescription drug management.    Received patient in signout.  Had previous SVT that has been converted.  Elevating troponins.  Hypotension.  History of CHF.  Pending cardiology consult and likely admission.  Due for repeat echo today.   Still waiting on consult.  I have discussed with Trish who states they are coming to see her.     Benjiman Core, MD 11/06/22 [phone]

## 2022-11-06 NOTE — ED Notes (Signed)
Pending cardiology. No changes. Resting comfortably. VSS. BP remains soft. Last MAP 65. Pending 4th trop results. Denies sx or complaints.

## 2022-11-06 NOTE — ED Notes (Signed)
Pt amiable, alert, NAD, calm, denies sx or complaints, "feel normal", resps e/u, speaking in clear complete sentences. Skin W&D. NSR HR 82. BP low 87/55, MAP 66.

## 2022-11-06 NOTE — Progress Notes (Signed)
  Echocardiogram 2D Echocardiogram has been performed.  Delcie Roch 11/06/2022, 3:51 PM

## 2022-11-06 NOTE — ED Notes (Signed)
Patient arrived to room, MD [facility] at bedside to evaluate patient.

## 2022-11-06 NOTE — ED Triage Notes (Addendum)
Patient reports central chest pain with palpitations /SOB this morning , her cardiologist is Dr. Clarise Cruz , history of CHF/SVT .

## 2022-11-06 NOTE — H&P (Addendum)
Cardiology H&P    Patient ID: Patricia Collier MRN: [id number]; DOB: 1946-04-22  Admit date: 11/06/2022 Date of Consult: 11/06/2022  PCP:  Sheliah Hatch, MD   [facility] Providers Cardiologist: Dr Gala Romney     Patient Profile:   Patricia Collier is a 77 y.o. female with a hx of breast cancer s/p left lumpectomy, previous tobacco use, aortic stenosis, PVCs, systolic HF due to NICM, who is being seen 11/06/2022 for the evaluation of SVT at the request of Dr Rubin Payor.  History of Present Illness:   [name] with above PMH who presented with concern of SVT. She is a retired Scientist, clinical (histocompatibility and immunogenetics). She was sitting in bed around 3 am today, started feeling unwell, very SOB at rest, dizzy, had mild left sided chest/breast discomfort, also was diaphoretic and pale. She used pulse oximetry at home and showed her heart rate was at 200s. She had apple watch which showed a fast heart rate on 1 lead EKG. She states she had SVT over 10 years ago. Episode lasted until she arrived ER and aborted after adenosine. She felt overall improved now.   She just returned from 3 weeks vacation, suffered a GI virus with diarrhea.  Symptoms have resolved over the weekend.  She was able to keep yourself hydrated with PO fluids. She denied any fever, chills, nausea, emesis, abdominal pain now.   She states she saw Dr Gala Romney recently on 10/09/22, was doing well, but did mention she was getting overall more SOB with exertion, since 08/2022. She is able to engage regular exercise 60 minutes daily, but noted overall decline in tolerance. She also noted brief  left sided chest discomfort occasionally, not associated with exertion, occurs randomly, without any specific trigger. She states her BP is normally on the low end, 100-110s systolic at baseline, taking all her heart failure medications. She states she was scheduled to get an Echo today but she ended up in the hospital. She is currently chest pain free, not  dyspneic or dizzy anymore. She states she received 1L fluid bolus at ED. She further reports her weight has been stable, never get any leg edema with CHF but abdominal bloating. She felt her abdomen seems at baseline for the past 2-3 days.   Admission diagnotic showed K 2.9, bicarb 21, glucose 165. Hs trop 26 >484 [id number]. BNP 196. CBC diff unremarkable Initial EKG and telemetry showed SVT at 200s, known LBBB. She was given adenosine x2 with cessation of SVT. Cardiology is consulted for further input.    Per chart review, patient follows advanced heart failure team Dr. Gala Romney outpatient.  She has history of left breast cancer, T1No Grade III invasive ductal carcinoma,  and received dose-dense doxorubicin in 2009 and cyclophosphamide x4, paclitaxel x12.    She was initially admitted 01/2017 for new onset acute systolic heart failure. Right and left heart cath 02/06/2017 revealed mild nonobstructive CAD with 20% dist Cx, 60% 1st Mrg, 20% dis RCA, 20% mid to dis LAD; low filling pressures with moderate to severely depressed cardiac output, Fick cardiac output/index = 3.1/1.8; NICM with LVEF 15-20%.  Cardiac MRI 06/2017 with LVEF 35%, small amount of LGE.  Placed on GDMT.  Echo 03/2018 with EF 45 to 50%, moderate AS with mean gradient 24 mmHg. Echo from 02/2020 with LVEF 50-55%, mild to moderate AS with mean gradient 18 mmHg.  Most  recent Echo from 08/21/2021 showed global hypokinesis worse in the septum, septal dyskinesis consistent with bundle branch block,  LVEF 45 to 50%, grade 1 DD, normal RV, normal PASP , trivial MR, mild aortic regurgitation, mild aortic stenosis with VTI 1.01cm2 and mean gradient 12.57mmHg. She was last seen 10/09/22 in the clinic, doing well, blood pressure does not tolerate up titration of GDMT, advised to continue spironolactone 12.5 mg, reduce losartan to 25 mg daily, continue carvedilol at 3.125 mg twice daily, continue Jardiance 10 mg daily, and continued ivabradine. Echo was  arranged, PRN CPX test was considered if needed.   She had Holter monitor 02/2018 revealing multiformed PVCs, couplets and triplets were noted with periods of bigeminy, the burden was less than 1%, no VT observed. Zio 9/21 PVCs 1.3% (monomorphic). She was placed on mexilitene 150mg  BID for suppression, amiodarone was used in the past and stopped.        Past Medical History:  Diagnosis Date   Arthritis    ostearthritis -joints, greater left knee.   Breast cancer [facility])    Breast cancer, left breast (HCC) 2009   S/P surgery, radiation, chemotherapy.-no further oncology visits.   CHF (congestive heart failure) (HCC) dx'd 02/05/2017   Hyperlipidemia    Hypertension    Neuromuscular disorder (HCC)    neuropathy due to chemotherapy   Personal history of chemotherapy 2009   Personal history of radiation therapy 2009   Seizures (HCC)    "childbirth related"   Sleep apnea    mild no C-PAP   SVT (supraventricular tachycardia)    x1 episode -3 yrs ago- no issues since.All heart testing negative. No further cardiac visits needed.    Past Surgical History:  Procedure Laterality Date   ABDOMINAL HYSTERECTOMY  1984   BILATERAL SALPINGOOPHORECTOMY Bilateral 2000s   BREAST BIOPSY Left 2009   BREAST LUMPECTOMY Left 2009   sentinel node dissection   BUNIONECTOMY Bilateral early 2000s   CARDIAC CATHETERIZATION  2015   DIAGNOSTIC LAPAROSCOPY  1990s-early 2000s   multiple for adhesions   DILATION AND CURETTAGE OF UTERUS  2000   w/laparoscopies   HAMMER TOE SURGERY Right 09/2015; 06/22/2016; 12/28/2016   2nd digit   KNEE ARTHROSCOPY Left 09/21/2015   Procedure: LEFT ARTHROSCOPY KNEE WITH LATERAL MENISCUS DEBRIDEMENT;  Surgeon: Ollen Gross, MD;  Location: [facility];  Service: Orthopedics;  Laterality: Left;   KNEE ARTHROSCOPY Right 10/09/2021   Procedure: Right knee arthroscopy; lateral meniscal debridement;  Surgeon: Ollen Gross, MD;  Location: [facility];  Service: Orthopedics;  Laterality:  Right;   KNEE SURGERY  10/09/2021   Right knee   LAPAROSCOPIC CHOLECYSTECTOMY     RHINOPLASTY  1979   RIGHT/LEFT HEART CATH AND CORONARY ANGIOGRAPHY N/A 02/06/2017   Procedure: Right/Left Heart Cath and Coronary Angiography;  Surgeon: Dolores Patty, MD;  Location: [facility];  Service: Cardiovascular;  Laterality: N/A;   TONSILLECTOMY AND ADENOIDECTOMY  1955   TRANSANAL RECTOPEXY  1990s     Home Medications:  Prior to Admission medications   Medication Sig Start Date End Date Taking? Authorizing Provider  carvedilol (COREG) 3.125 MG tablet Take 1 tablet (3.125 mg total) by mouth 2 (two) times daily. 11/02/22  Yes Bensimhon, Bevelyn Buckles, MD  Coenzyme Q10 (COQ10) 100 MG CAPS  08/10/20  Yes [provider]  empagliflozin (JARDIANCE) 10 MG TABS tablet Take 1 tablet (10 mg total) by mouth daily before breakfast. 05/25/22  Yes Bensimhon, Bevelyn Buckles, MD  ivabradine (CORLANOR) 5 MG TABS tablet Take 1 tablet (5 mg total) by mouth 2 (two) times daily with a meal. 11/02/22  Yes  Bensimhon, Bevelyn Buckles, MD  losartan (COZAAR) 25 MG tablet Take 1 tablet (25 mg total) by mouth daily. 10/09/22  Yes Bensimhon, Bevelyn Buckles, MD  Magnesium 250 MG TABS Take 250 mg by mouth daily.   Yes [provider]  mexiletine (MEXITIL) 150 MG capsule TAKE 1 CAPSULE BY MOUTH 2 TIMES DAILY. 11/17/21 11/26/22 Yes Bensimhon, Bevelyn Buckles, MD  ondansetron (ZOFRAN) 4 MG tablet Take 1 tablet (4 mg total) by mouth every 8 (eight) hours as needed for nausea or vomiting. 10/09/22  Yes Sheliah Hatch, MD  rosuvastatin (CRESTOR) 10 MG tablet Take 1 tablet (10 mg total) by mouth at bedtime. 06/27/22  Yes Sheliah Hatch, MD  spironolactone (ALDACTONE) 25 MG tablet Take 0.5 tablets (12.5 mg total) by mouth daily. 03/26/22  Yes Bensimhon, Bevelyn Buckles, MD  zinc gluconate 50 MG tablet Take 50 mg by mouth daily.   Yes [provider]    Inpatient Medications: Scheduled Meds:  Continuous Infusions:  PRN  Meds:   Allergies:    Allergies  Allergen Reactions   Effexor [Venlafaxine] Other (See Comments)    Caused depression, negative thoughts, and crying    Hydrocodone-Acetaminophen Itching and Nausea And Vomiting    Patient can tolerate with Zofran    Social History:   Social History   Socioeconomic History   Marital status: Married    Spouse name: Not on file   Number of children: 2   Years of education: Not on file   Highest education level: Not on file  Occupational History   Occupation: anesthetist    Employer: [employer]    Comment: retired June 2019  Tobacco Use   Smoking status: Former    Packs/day: 2.00    Years: 10.00    Additional pack years: 0.00    Total pack years: 20.00    Types: Cigarettes    Quit date: 07/09/1989    Years since quitting: 33.3   Smokeless tobacco: Never  Vaping Use   Vaping Use: Never used  Substance and Sexual Activity   Alcohol use: No   Drug use: No   Sexual activity: Yes  Other Topics Concern   Not on file  Social History Narrative   Patient with caregiver stress due to spouses declining health   Some conflicts with children and addictions (coping well)    Social Determinants of Health   Financial Resource Strain: Low Risk  (06/06/2022)   Overall Financial Resource Strain (CARDIA)    Difficulty of Paying Living Expenses: Not hard at all  Food Insecurity: No Food Insecurity (06/06/2022)   Hunger Vital Sign    Worried About Running Out of Food in the Last Year: Never true    Ran Out of Food in the Last Year: Never true  Transportation Needs: No Transportation Needs (06/06/2022)   PRAPARE - Administrator, Civil Service (Medical): No    Lack of Transportation (Non-Medical): No  Physical Activity: Inactive (06/06/2022)   Exercise Vital Sign    Days of Exercise per Week: 0 days    Minutes of Exercise per Session: 0 min  Stress: Stress Concern Present (06/06/2022)   [employer] of Occupational Health -  Occupational Stress Questionnaire    Feeling of Stress : To some extent  Social Connections: Moderately Integrated (06/06/2022)   Social Connection and Isolation Panel [NHANES]    Frequency of Communication with Friends and Family: More than three times a week    Frequency of Social Gatherings with  Friends and Family: Once a week    Attends Religious Services: Never    Database administrator or Organizations: Yes    Attends Engineer, structural: More than 4 times per year    Marital Status: Married  Catering manager Violence: Not At Risk (06/20/2020)   Humiliation, Afraid, Rape, and Kick questionnaire    Fear of Current or Ex-Partner: No    Emotionally Abused: No    Physically Abused: No    Sexually Abused: No    Family History:    Family History  Problem Relation Age of Onset   Heart disease Mother    Hypertension Father    Breast cancer Sister    Lung cancer Sister    Skin cancer Brother      ROS:  Constitutional: Denied fever, chills, malaise, night sweats Eyes: Denied vision change or loss Ears/Nose/Mouth/Throat: Denied ear ache, sore throat, coughing, sinus pain Cardiovascular: see HPI  Respiratory: see HPI  Gastrointestinal: Denied nausea, vomiting, abdominal pain, diarrhea Genital/Urinary: Denied dysuria, hematuria, urinary frequency/urgency Musculoskeletal: Denied muscle ache, joint pain, weakness Skin: Denied rash, wound Neuro: see HPI  Psych: Denied history of depression/anxiety  Endocrine: Denied history of diabetes   Physical Exam/Data:   Vitals:   11/06/22 1200 11/06/22 1215 11/06/22 1219 11/06/22 1230  BP: (!) 99/58 (!) 75/46  (!) 80/44  Pulse: 75 77  74  Resp: 20 10    Temp:   97.9 F (36.6 C)   TempSrc:   Oral   SpO2: 100% 100%  100%    Intake/Output Summary (Last 24 hours) at 11/06/2022 1334 Last data filed at 11/06/2022 1317 Gross per 24 hour  Intake 1325 ml  Output 775 ml  Net 550 ml      10/09/2022    2:15 PM 06/06/2022    11:54 AM 02/22/2022   11:28 AM  Last 3 Weights  Weight (lbs) 136 lb 9.6 oz 154 lb 12.8 oz 148 lb  Weight (kg) 61.961 kg 70.217 kg 67.132 kg     There is no height or weight on file to calculate BMI.   Vitals:  Vitals:   11/06/22 1219 11/06/22 1230  BP:  (!) 80/44  Pulse:  74  Resp:    Temp: 97.9 F (36.6 C)   SpO2:  100%   General Appearance: In no apparent distress, laying in bed HEENT: Normocephalic, atraumatic.  Neck: Supple, trachea midline, no JVDs Cardiovascular: Regular rate and rhythm, normal S1-S2, grade II systolic murmur RUSB Respiratory: Resting breathing unlabored, lungs sounds clear to auscultation bilaterally, no use of accessory muscles. On room air.  No wheezes, rales or rhonchi.  Speaks full sentence  Gastrointestinal: Bowel sounds positive, abdomen soft, non-distended  Extremities: Able to move all extremities in bed without difficulty, no edema of BLE  Musculoskeletal: Normal muscle bulk and tone Skin: Intact, warm, dry. No rashes or petechiae noted in exposed areas.  Neurologic: Alert, oriented to person, place and time. Fluent speech, no cognitive deficit, no pronator drift, no gross focal neuro deficit Psychiatric: Normal affect. Mood is appropriate.     EKG:  The EKG was personally reviewed and demonstrates:    Initial EKG today showed SVT 212, repeat EKG sinus rhythm 94bpm, LBBB   Telemetry:  Telemetry was personally reviewed and demonstrates:    SVT 200s has aborted, currently in sinus rhythm with occasional PVCs and PACs   Relevant CV Studies:   Echo from 08/21/21:   1. Global hypokinesis worse in the septum.  Septal dyskinesis consistent  with bundle branch block. Compared with the echo 02/2020, sytolic function  and GLS have worsened. Left ventricular ejection fraction, by estimation,  is 45 to 50%. The left ventricle  has mildly decreased function. The left ventricle demonstrates global  hypokinesis. Left ventricular diastolic parameters  are consistent with  Grade I diastolic dysfunction (impaired relaxation). Elevated left  ventricular end-diastolic pressure. The  average left ventricular global longitudinal strain is -16.2 %. The global  longitudinal strain is abnormal.   2. Right ventricular systolic function is normal. The right ventricular  size is normal. There is normal pulmonary artery systolic pressure.   3. The mitral valve is normal in structure. Trivial mitral valve  regurgitation. No evidence of mitral stenosis.   4. The aortic valve is tricuspid. There is mild calcification of the  aortic valve. There is mild thickening of the aortic valve. Aortic valve  regurgitation is mild. Mild aortic valve stenosis. Aortic regurgitation  PHT measures 841 msec. Aortic valve  area, by VTI measures 1.01 cm. Aortic valve mean gradient measures 12.7  mmHg. Aortic valve Vmax measures 2.32 m/s.   5. The inferior vena cava is normal in size with greater than 50%  respiratory variability, suggesting right atrial pressure of 3 mmHg.    Laboratory Data:  High Sensitivity Troponin:   Recent Labs  Lab 11/06/22 0344 11/06/22 0606 11/06/22 0830 11/06/22 1028  TROPONINIHS 26* 484* 1,087* 1,069*     Chemistry Recent Labs  Lab 11/06/22 0344 11/06/22 1028  NA 137 141  K 2.9* 3.2*  CL 103 109  CO2 21* 20*  GLUCOSE 165* 96  BUN 15 11  CREATININE 0.88 0.54  CALCIUM 8.9 8.4*  MG 1.8  --   GFRNONAA >60 >60  ANIONGAP 13 12    Recent Labs  Lab 11/06/22 0344  PROT 6.2*  ALBUMIN 3.5  AST 39  ALT 40  ALKPHOS 91  BILITOT 0.4   Lipids No results for input(s): "CHOL", "TRIG", "HDL", "LABVLDL", "LDLCALC", "CHOLHDL" in the last 168 hours.  Hematology Recent Labs  Lab 11/06/22 0344  WBC 5.3  RBC 4.70  HGB 14.5  HCT 43.4  MCV 92.3  MCH 30.9  MCHC 33.4  RDW 13.2  PLT 193   Thyroid No results for input(s): "TSH", "FREET4" in the last 168 hours.  BNP Recent Labs  Lab 11/06/22 0344  BNP 196.1*    DDimer No  results for input(s): "DDIMER" in the last 168 hours.   Radiology/Studies:  DG CHEST PORT 1 VIEW  Result Date: 11/06/2022 CLINICAL DATA:  Chest pain EXAM: PORTABLE CHEST 1 VIEW COMPARISON:  02/04/2020 FINDINGS: The heart size and mediastinal contours are within normal limits. Both lungs are clear. The visualized skeletal structures are unremarkable. IMPRESSION: No active disease. Electronically Signed   By: Alcide Clever M.D.   On: 11/06/2022 03:58     Assessment and Plan:   Paroxysmal SVT - lasted 1-2 hours, aborted with adenosine  - maybe due to hypokalemia 2.9 from recent GI illness  - will replace additional K and optimize Mag now, see order, keep Mag >2 and K >4  - will check Echo  - continue telemetry monitor   NSTEMI Non-obstructive CAD - endorses non-specific chest discomfort and DOE since 08/2022 - Hs trop peaked at 1087, down to 1069 - EKG without acute ischemic findings - LHC 2018 with non-obstructive CAD - likely demand from SVT, unable rule out CAD - she is chest pain and symptoms free  currently, not heparin gtt indicated  - will check Echo - plan for heart cath tomorrow, NPO after midnight   NICM Chronic systolic heart failure -Last echo 2/23 of EF 45 to 50%, grade 1 diastolic dysfunction -Initially hypotensive, does not appear in low output heart failure, will check lactic acid  - clinically euvolemic on exam, s/p 1L IVF at ED, encourage PO hydration for now  - Resume Jardiance 10 mg and ivabradine 5mg  BID, Hold GDMT with spironolactone 12.5 mg, carvedilol 3.125 mg BID, and losartan 25mg  for now - trend BP   PVCs -Continue mexilitene 150mg  BID   Aortic stenosis Mitral regurgitation -Noted in previous cardiac MRI and echo, will repeat echo to reassess today  DVT prophylaxis  - SQ heparin 5000 unit q8h  Ad: Full code Dispo: pending clinical course      Risk Assessment/Risk Scores:   New York Heart Association (NYHA) Functional Class NYHA Class II    Severity of Illness: The appropriate patient status for this patient is INPATIENT. Inpatient status is judged to be reasonable and necessary in order to provide the required intensity of service to ensure the patient's safety. The patient's presenting symptoms, physical exam findings, and initial radiographic and laboratory data in the context of their chronic comorbidities is felt to place them at high risk for further clinical deterioration. Furthermore, it is not anticipated that the patient will be medically stable for discharge from the hospital within 2 midnights of admission.   * I certify that at the point of admission it is my clinical judgment that the patient will require inpatient hospital care spanning beyond 2 midnights from the point of admission due to high intensity of service, high risk for further deterioration and high frequency of surveillance required.*      For questions or updates, please contact [facility] Please consult [email or web address] for contact info under    Signed, Cyndi Bender, NP  11/06/2022 1:34 PM  Personally seen and examined. Agree with above.  77 year old with NICM from doxorubicin breast cancer therapy 2009 with XRT with prior cardiac catheterization 2018 showing moderate nonobstructive coronary artery disease here with SVT which broke with adenosine administration.  She was going to 100 bpm.  Troponin drawn following this event increased to 1000.  She feels better now that she is in normal sinus rhythm.  Recent vacation, diarrheal illness.  She is been trying to hydrate well with fluids, apple juice she states.  Potassium on arrival was 2.9.  Getting repleted.  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  Now enjoying being a grandmother, 60-year-old granddaughter, has taken her on [address] cruise, [place] and next trip will be [place].  She also has a 13 year old grandson as well.  On exam she is alert and oriented x 3 in no acute distress lungs are clear heart  regular rate and rhythm 2/6 systolic murmur, no edema.  Notable medications include ivabradine, low-dose carvedilol, mexiletine.  She stopped amiodarone for fear of future potential side effects.  Blood pressure has been soft.  BNP 196 white count 5.3 hemoglobin 14.5 potassium 2.9, on repeat 3.2, troponin 1069  Originally EKG showed SVT 212 bpm, repeat showed sinus rhythm 94 left bundle branch block.  Paroxysmal SVT Type II myocardial infarction Nonobstructive CAD Nonischemic cardiomyopathy most recent EF 45 to 50% Chronic systolic heart failure PVCs Aortic stenosis Mitral regurgitation  -Symptoms of SVT lasted about 1 to 2 hours, she felt poor diaphoretic, adenosine broke.  Recent diarrheal illness and hypokalemia  may have been playing a role as well.  Replating potassium. -Checking echocardiogram.  She was post to have her outpatient echocardiogram done today. -Keeping magnesium and potassium repleted. -Has had nonspecific chest discomfort at times, feels like a pressure over her left breast.  She is a breast cancer survivor, has had prior radiation.  With her elevated troponin currently most likely secondary to supply/demand mismatch in the setting of tacky arrhythmia with underlying moderate CAD and recent symptoms of occasional chest discomfort, it makes sense for [place] to proceed with cardiac catheterization.  Risks and benefits of been explained including stroke heart attack death renal impairment bleeding.  Willing to proceed.  If she does not have any significant flow obstructive disease or plaque ulceration, this can be termed a type II myocardial infarction. -Clinically appears euvolemic, resuming Jardiance 10 mg ivabradine 5 mg twice a day.  Holding spironolactone 12.5, carvedilol 3.125 twice daily and losartan 25 for now.  May be able to replace tomorrow. -Continue with mexiletine 150 mg twice daily for PVCs. -Mild aortic stenosis should be of little clinical concern at this point.   Repeating echocardiogram.  Discussed case with Dr. Gala Romney of advanced heart failure team.  Donato Schultz, MD

## 2022-11-06 NOTE — ED Notes (Signed)
Cards re-paged. EDP into see.

## 2022-11-06 NOTE — ED Notes (Signed)
Resting comfortably, no changes, 3rd trop sent

## 2022-11-06 NOTE — ED Notes (Signed)
Finished with [facility]. Cards NP into see.

## 2022-11-07 ENCOUNTER — Encounter (HOSPITAL_COMMUNITY)
Admission: EM | Disposition: A | Discharge status: Home / Self Care | Payer: Self-pay | Source: Home / Self Care | Attending: Cardiology

## 2022-11-07 ENCOUNTER — Other Ambulatory Visit (HOSPITAL_COMMUNITY): Payer: Self-pay

## 2022-11-07 DIAGNOSIS — I350 Nonrheumatic aortic (valve) stenosis: Secondary | ICD-10-CM | POA: Diagnosis not present

## 2022-11-07 DIAGNOSIS — I5022 Chronic systolic (congestive) heart failure: Secondary | ICD-10-CM | POA: Diagnosis not present

## 2022-11-07 DIAGNOSIS — I214 Non-ST elevation (NSTEMI) myocardial infarction: Secondary | ICD-10-CM | POA: Diagnosis not present

## 2022-11-07 DIAGNOSIS — I2510 Atherosclerotic heart disease of native coronary artery without angina pectoris: Secondary | ICD-10-CM

## 2022-11-07 HISTORY — PX: RIGHT/LEFT HEART CATH AND CORONARY ANGIOGRAPHY: CATH118266

## 2022-11-07 LAB — BASIC METABOLIC PANEL
Anion gap: 8 (ref 5–15)
BUN: 8 mg/dL (ref 8–23)
CO2: 22 mmol/L (ref 22–32)
Calcium: 8.7 mg/dL — ABNORMAL LOW (ref 8.9–10.3)
Chloride: 110 mmol/L (ref 98–111)
Creatinine, Ser: 0.55 mg/dL (ref 0.44–1.00)
GFR, Estimated: >60 mL/min (ref >60)
Glucose, Bld: 92 mg/dL (ref 70–99)
Potassium: 3.9 mmol/L (ref 3.5–5.1)
Sodium: 140 mmol/L (ref 135–145)

## 2022-11-07 LAB — CBC
HCT: 37.6 % (ref 36.0–46.0)
Hemoglobin: 13.1 g/dL (ref 12.0–15.0)
MCH: 31.5 pg (ref 26.0–34.0)
MCHC: 34.8 g/dL (ref 30.0–36.0)
MCV: 90.4 fL (ref 80.0–100.0)
Platelets: 168 10*3/uL (ref 150–400)
RBC: 4.16 MIL/uL (ref 3.87–5.11)
RDW: 13.2 % (ref 11.5–15.5)
WBC: 4.3 10*3/uL (ref 4.0–10.5)
nRBC: 0 % (ref 0.0–0.2)

## 2022-11-07 LAB — POCT I-STAT EG7
Acid-base deficit: 2 mmol/L (ref 0.0–2.0)
Bicarbonate: 23.1 mmol/L (ref 20.0–28.0)
Calcium, Ion: 1.26 mmol/L (ref 1.15–1.40)
HCT: 37 % (ref 36.0–46.0)
Hemoglobin: 12.6 g/dL (ref 12.0–15.0)
O2 Saturation: 67 %
Potassium: 3.9 mmol/L (ref 3.5–5.1)
Sodium: 141 mmol/L (ref 135–145)
TCO2: 24 mmol/L (ref 22–32)
pCO2, Ven: 39.6 mmHg — ABNORMAL LOW (ref 44–60)
pH, Ven: 7.375 (ref 7.25–7.43)
pO2, Ven: 36 mmHg (ref 32–45)

## 2022-11-07 LAB — MAGNESIUM: Magnesium: 2.1 mg/dL (ref 1.7–2.4)

## 2022-11-07 LAB — POCT I-STAT 7, (LYTES, BLD GAS, ICA,H+H)
Acid-base deficit: 2 mmol/L (ref 0.0–2.0)
Bicarbonate: 22 mmol/L (ref 20.0–28.0)
Calcium, Ion: 1.26 mmol/L (ref 1.15–1.40)
HCT: 38 % (ref 36.0–46.0)
Hemoglobin: 12.9 g/dL (ref 12.0–15.0)
O2 Saturation: 96 %
Potassium: 4 mmol/L (ref 3.5–5.1)
Sodium: 141 mmol/L (ref 135–145)
TCO2: 23 mmol/L (ref 22–32)
pCO2 arterial: 34 mmHg (ref 32–48)
pH, Arterial: 7.419 (ref 7.35–7.45)
pO2, Arterial: 78 mmHg — ABNORMAL LOW (ref 83–108)

## 2022-11-07 LAB — LIPID PANEL
Cholesterol: 107 mg/dL (ref 0–200)
HDL: 37 mg/dL — ABNORMAL LOW (ref >40)
LDL Cholesterol: 54 mg/dL (ref 0–99)
Total CHOL/HDL Ratio: 2.9 RATIO
Triglycerides: 82 mg/dL (ref <150)
VLDL: 16 mg/dL (ref 0–40)

## 2022-11-07 SURGERY — RIGHT/LEFT HEART CATH AND CORONARY ANGIOGRAPHY
Anesthesia: LOCAL

## 2022-11-07 MED ORDER — HEPARIN (PORCINE) IN NACL 1000-0.9 UT/500ML-% IV SOLN
INTRAVENOUS | Status: DC | PRN
  Administered 2022-11-07 (×2): 500 mL

## 2022-11-07 MED ORDER — SODIUM CHLORIDE 0.9% FLUSH
3.0000 mL | Freq: Two times a day (BID) | INTRAVENOUS | Status: DC
  Administered 2022-11-07: 3 mL via INTRAVENOUS

## 2022-11-07 MED ORDER — SODIUM CHLORIDE 0.9 % IV SOLN: INTRAVENOUS | Status: DC

## 2022-11-07 MED ORDER — HEPARIN SODIUM (PORCINE) 1000 UNIT/ML IJ SOLN
INTRAMUSCULAR | Status: AC
  Filled 2022-11-07: qty 10

## 2022-11-07 MED ORDER — SODIUM CHLORIDE 0.9% FLUSH: 3.0000 mL | INTRAVENOUS | Status: DC | PRN

## 2022-11-07 MED ORDER — VERAPAMIL HCL 2.5 MG/ML IV SOLN
INTRAVENOUS | Status: AC
  Filled 2022-11-07: qty 2

## 2022-11-07 MED ORDER — SODIUM CHLORIDE 0.9 % IV SOLN: 250.0000 mL | INTRAVENOUS | Status: DC | PRN

## 2022-11-07 MED ORDER — LIDOCAINE HCL (PF) 1 % IJ SOLN
INTRAMUSCULAR | Status: AC
  Filled 2022-11-07: qty 30

## 2022-11-07 MED ORDER — ASPIRIN 81 MG PO TBEC
81.0000 mg | DELAYED_RELEASE_TABLET | Freq: Every day | ORAL | 12 refills | Status: DC
  Filled 2022-11-07: qty 30, 30d supply, fill #0
  Filled 2022-12-12: qty 30, 30d supply, fill #1

## 2022-11-07 MED ORDER — FENTANYL CITRATE (PF) 100 MCG/2ML IJ SOLN
INTRAMUSCULAR | Status: AC
  Filled 2022-11-07: qty 2

## 2022-11-07 MED ORDER — HEPARIN SODIUM (PORCINE) 1000 UNIT/ML IJ SOLN
INTRAMUSCULAR | Status: DC | PRN
  Administered 2022-11-07: 3000 [IU] via INTRAVENOUS

## 2022-11-07 MED ORDER — MIDAZOLAM HCL 2 MG/2ML IJ SOLN
INTRAMUSCULAR | Status: AC
  Filled 2022-11-07: qty 2

## 2022-11-07 MED ORDER — VERAPAMIL HCL 2.5 MG/ML IV SOLN
INTRAVENOUS | Status: DC | PRN
  Administered 2022-11-07: 10 mL via INTRA_ARTERIAL

## 2022-11-07 MED ORDER — FENTANYL CITRATE (PF) 100 MCG/2ML IJ SOLN
INTRAMUSCULAR | Status: DC | PRN
  Administered 2022-11-07: 25 ug via INTRAVENOUS

## 2022-11-07 MED ORDER — MIDAZOLAM HCL 2 MG/2ML IJ SOLN
INTRAMUSCULAR | Status: DC | PRN
  Administered 2022-11-07: 1 mg via INTRAVENOUS

## 2022-11-07 MED ORDER — LIDOCAINE HCL (PF) 1 % IJ SOLN
INTRAMUSCULAR | Status: DC | PRN
  Administered 2022-11-07 (×2): 2 mL via INTRADERMAL

## 2022-11-07 MED ORDER — POTASSIUM CHLORIDE CRYS ER 20 MEQ PO TBCR
20.0000 meq | EXTENDED_RELEASE_TABLET | Freq: Once | ORAL | Status: AC
  Administered 2022-11-07: 20 meq via ORAL
  Filled 2022-11-07: qty 1

## 2022-11-07 MED ORDER — IOHEXOL 350 MG/ML SOLN
INTRAVENOUS | Status: DC | PRN
  Administered 2022-11-07: 30 mL

## 2022-11-07 SURGICAL SUPPLY — 13 items
CATH BALLN WEDGE 5F 110CM (CATHETERS) IMPLANT
CATH INFINITI 5FR JK (CATHETERS) IMPLANT
DEVICE RAD TR BAND REGULAR (VASCULAR PRODUCTS) IMPLANT
GLIDESHEATH SLEND SS 6F .021 (SHEATH) IMPLANT
GUIDEWIRE INQWIRE 1.5J.035X260 (WIRE) IMPLANT
INQWIRE 1.5J .035X260CM (WIRE) ×1
KIT HEART LEFT (KITS) ×2 IMPLANT
PACK CARDIAC CATHETERIZATION (CUSTOM PROCEDURE TRAY) ×2 IMPLANT
SHEATH GLIDE SLENDER 4/5FR (SHEATH) IMPLANT
SHEATH PROBE COVER 6X72 (BAG) IMPLANT
SYR MEDRAD MARK 7 150ML (SYRINGE) ×2 IMPLANT
TRANSDUCER W/STOPCOCK (MISCELLANEOUS) ×2 IMPLANT
TUBING CIL FLEX 10 FLL-RA (TUBING) ×2 IMPLANT

## 2022-11-07 NOTE — Discharge Summary (Signed)
Advanced Heart Failure Team  Discharge Summary   Patient ID: Patricia Collier MRN: [id number], DOB/AGE: [birth year] 75 y.o. Admit date: 11/06/2022 D/C date:     11/07/2022   Primary Discharge Diagnoses:  SVT Chronic Systolic Heart Failure, NICM  Elevated HS Troponin Due to Demand Ischemia Moderate Aortic Stenosis  Mild CAD    Hospital Course:   77 y/o female with h/o chronic systolic heart failure, EF previously 15-20%, nonischemic and felt to be related to previous doxorubicin therapy. Most recent echo 08/21/21 EF 40-45% w/ LBBB dyssynchrony. Mild-mod AS mean gradient 13 AVA 1.01 cm2. Also w/ h/o frequent PVCs.   Admitted  on 4/30 with SVT rates ~220. Terminated with adenosine. Subsequently bumped troponin, peaked at 1,087. Echo showed EF 35-40% and mod AS (Mean gradient 18 mm Hg. AVA by continuity  equation 0.79. DVI 0.25). Underwent R/LHC which demonstrated minimal CAD (1st Mrg lesion 30% stenosed). RHC ok, RA: 1 mmHg, RV: 19/3 mmHg, PA: 22/10, Cardiac output is 4.24 with a cardiac index of 2.57.  Cath demonstrated moderate aortic stenosis with peak to peak gradient of 11 mmHg, mean gradient of 19 mmHg and valve area of 1.1 cm. Continued medical therapy of HF recommended. Will continue to titrate GDMT as outpatient. AS will be followed.   On 5/1, she was last seen and examined by Dr. Gala Romney and felt stable for discharge home. Post hospital f/u arranged in the [facility].     2D Echo 11/05/21 1. Septal and apical wall motion abnormalities; no LV thrombus noted.  Left ventricular ejection fraction, by estimation, is 35 to 40%. The left  ventricle has moderately decreased function. The left ventricle  demonstrates regional wall motion  abnormalities (see scoring diagram/findings for description). There is  mild asymmetric left ventricular hypertrophy of the basal-septal segment.  Left ventricular diastolic parameters are consistent with Grade I  diastolic dysfunction (impaired   relaxation).   2. Right ventricular systolic function is normal. The right ventricular  size is normal. There is normal pulmonary artery systolic pressure.   3. Left atrial size was mildly dilated.   4. The mitral valve is degenerative. Mild mitral valve regurgitation. No  evidence of mitral stenosis. The mean mitral valve gradient is 3.7 mmHg  with average heart rate of 78 bpm.   5. Decreased LV stroke volume. Mean gradient 18 mm Hg. AVA by continuity  equation 0.79. DVI 0.25. Overall suggestive of at least moderate low flow  low gradient aortic stenosis.. The aortic valve is calcified. Aortic valve  regurgitation is trivial.  Moderate aortic valve stenosis. Aortic valve area, by VTI measures 0.79  cm. Aortic valve mean gradient measures 18.0 mmHg.   6. The inferior vena cava is normal in size with greater than 50%  respiratory variability, suggesting right atrial pressure of 3 mmHg.   [facility] 11/07/22    1st Mrg lesion is 30% stenosed.   1.  Minor irregularities with no evidence of obstructive coronary artery disease. 2.  Left ventricular angiography was not performed.  EF was moderately reduced by echo. 3.  Moderate aortic stenosis with peak to peak gradient of 11 mmHg, mean gradient of 19 mmHg and valve area of 1.1 cm 4.  Right heart catheterization showed low right and left-sided filling pressures, normal pulmonary pressure and normal cardiac output.   RA: 1 mmHg RV: 19/3 mmHg PA: 22/10  Cardiac output is 4.24 with a cardiac index of 2.57.   Recommendations: Continue medical therapy for chronic systolic heart failure.  Aortic stenosis in the moderate range and does not require intervention at this time.  Discharge Vitals: Blood pressure (!) 100/58, pulse 80, temperature 97.8 F (36.6 C), temperature source Oral, resp. rate 16, height 5\' 3"  (1.6 m), weight 61.7 kg, SpO2 99 %.  Labs: Lab Results  Component Value Date   WBC 4.3 11/07/2022   HGB 13.1 11/07/2022   HCT 37.6  11/07/2022   MCV 90.4 11/07/2022   PLT 168 11/07/2022    Recent Labs  Lab 11/06/22 0344 11/06/22 1028 11/07/22 0448  NA 137   < > 140  K 2.9*   < > 3.9  CL 103   < > 110  CO2 21*   < > 22  BUN 15   < > 8  CREATININE 0.88   < > 0.55  CALCIUM 8.9   < > 8.7*  PROT 6.2*  --   --   BILITOT 0.4  --   --   ALKPHOS 91  --   --   ALT 40  --   --   AST 39  --   --   GLUCOSE 165*   < > 92   < > = values in this interval not displayed.   Lab Results  Component Value Date   CHOL 107 11/07/2022   HDL 37 (L) 11/07/2022   LDLCALC 54 11/07/2022   TRIG 82 11/07/2022   BNP (last 3 results) Recent Labs    11/16/21 1449 11/06/22 0344  BNP 77.4 196.1*    ProBNP (last 3 results) No results for input(s): "PROBNP" in the last 8760 hours.   Diagnostic Studies/Procedures   CARDIAC CATHETERIZATION  Result Date: 11/07/2022   1st Mrg lesion is 30% stenosed. 1.  Minor irregularities with no evidence of obstructive coronary artery disease. 2.  Left ventricular angiography was not performed.  EF was moderately reduced by echo. 3.  Moderate aortic stenosis with peak to peak gradient of 11 mmHg, mean gradient of 19 mmHg and valve area of 1.1 cm 4.  Right heart catheterization showed low right and left-sided filling pressures, normal pulmonary pressure and normal cardiac output. RA: 1 mmHg RV: 19/3 mmHg PA: 22/10 Cardiac output is 4.24 with a cardiac index of 2.57. Recommendations: Continue medical therapy for chronic systolic heart failure.  Aortic stenosis in the moderate range and does not require intervention at this time.   ECHOCARDIOGRAM COMPLETE  Result Date: 11/06/2022    ECHOCARDIOGRAM REPORT   Patient Name:   Patricia Collier Date of Exam: 11/06/2022 Medical Rec #:  [id number]          Height:       63.0 in Accession #:    [id number]         Weight:       136.6 lb Date of Birth:  [birth year]         BSA:          1.644 m Patient Age:    77 years           BP:           98/61 mmHg Patient  Gender: F                  HR:           76 bpm. Exam Location:  Inpatient Procedure: 2D Echo, Cardiac Doppler and Color Doppler Indications:    chest pain  History:        Patient has prior  history of Echocardiogram examinations, most                 recent 08/21/2021. Cardiomyopathy, Aortic Valve Disease;                 Arrythmias:PVC and SVT.  Sonographer:    Delcie Roch RDCS Referring Phys: [id number] Cyndi Bender IMPRESSIONS  1. Septal and apical wall motion abnormalities; no LV thrombus noted. Left ventricular ejection fraction, by estimation, is 35 to 40%. The left ventricle has moderately decreased function. The left ventricle demonstrates regional wall motion abnormalities (see scoring diagram/findings for description). There is mild asymmetric left ventricular hypertrophy of the basal-septal segment. Left ventricular diastolic parameters are consistent with Grade I diastolic dysfunction (impaired relaxation).  2. Right ventricular systolic function is normal. The right ventricular size is normal. There is normal pulmonary artery systolic pressure.  3. Left atrial size was mildly dilated.  4. The mitral valve is degenerative. Mild mitral valve regurgitation. No evidence of mitral stenosis. The mean mitral valve gradient is 3.7 mmHg with average heart rate of 78 bpm.  5. Decreased LV stroke volume. Mean gradient 18 mm Hg. AVA by continuity equation 0.79. DVI 0.25. Overall suggestive of at least moderate low flow low gradient aortic stenosis.. The aortic valve is calcified. Aortic valve regurgitation is trivial. Moderate aortic valve stenosis. Aortic valve area, by VTI measures 0.79 cm. Aortic valve mean gradient measures 18.0 mmHg.  6. The inferior vena cava is normal in size with greater than 50% respiratory variability, suggesting right atrial pressure of 3 mmHg. Comparison(s): Prior images reviewed side by side. Jule Economy appears visually worse from prior. FINDINGS  Left Ventricle: Septal and apical wall  motion abnormalities; no LV thrombus noted. Left ventricular ejection fraction, by estimation, is 35 to 40%. The left ventricle has moderately decreased function. The left ventricle demonstrates regional wall motion abnormalities. The left ventricular internal cavity size was normal in size. There is mild asymmetric left ventricular hypertrophy of the basal-septal segment. Left ventricular diastolic parameters are consistent with Grade I diastolic dysfunction  (impaired relaxation).  LV Wall Scoring: The apical lateral segment, mid inferoseptal segment, apical septal segment, apical anterior segment, and apical inferior segment are hypokinetic. Right Ventricle: The right ventricular size is normal. No increase in right ventricular wall thickness. Right ventricular systolic function is normal. There is normal pulmonary artery systolic pressure. The tricuspid regurgitant velocity is 1.99 m/s, and  with an assumed right atrial pressure of 3 mmHg, the estimated right ventricular systolic pressure is 18.8 mmHg. Left Atrium: Left atrial size was mildly dilated. Right Atrium: Right atrial size was normal in size. Pericardium: There is no evidence of pericardial effusion. Mitral Valve: The mitral valve is degenerative in appearance. Mild mitral valve regurgitation. No evidence of mitral valve stenosis. The mean mitral valve gradient is 3.7 mmHg with average heart rate of 78 bpm. Tricuspid Valve: The tricuspid valve is normal in structure. Tricuspid valve regurgitation is trivial. No evidence of tricuspid stenosis. Aortic Valve: Decreased LV stroke volume. Mean gradient 18 mm Hg. AVA by continuity equation 0.79. DVI 0.25. Overall suggestive of at least moderate low flow low gradient aortic stenosis. The aortic valve is calcified. Aortic valve regurgitation is trivial. Moderate aortic stenosis is present. Aortic valve mean gradient measures 18.0 mmHg. Aortic valve peak gradient measures 31.1 mmHg. Aortic valve area, by VTI  measures 0.79 cm. Pulmonic Valve: The pulmonic valve was not well visualized. Pulmonic valve regurgitation is not visualized. Aorta: The aortic root and  ascending aorta are structurally normal, with no evidence of dilitation. Venous: The inferior vena cava is normal in size with greater than 50% respiratory variability, suggesting right atrial pressure of 3 mmHg. IAS/Shunts: The atrial septum is grossly normal.  LEFT VENTRICLE PLAX 2D LVIDd:         4.80 cm      Diastology LVIDs:         4.20 cm      LV e' medial:    5.44 cm/s LV PW:         0.90 cm      LV E/e' medial:  15.5 LV IVS:        0.90 cm      LV e' lateral:   7.83 cm/s LVOT diam:     2.00 cm      LV E/e' lateral: 10.8 LV SV:         47 LV SV Index:   28 LVOT Area:     3.14 cm  LV Volumes (MOD) LV vol d, MOD A4C: 117.0 ml LV vol s, MOD A4C: 78.5 ml LV SV MOD A4C:     117.0 ml RIGHT VENTRICLE            IVC RV Basal diam:  2.70 cm    IVC diam: 1.20 cm RV S prime:     9.90 cm/s TAPSE (M-mode): 1.7 cm LEFT ATRIUM             Index        RIGHT ATRIUM           Index LA diam:        3.90 cm 2.37 cm/m   RA Area:     15.20 cm LA Vol (A2C):   54.7 ml 33.26 ml/m  RA Volume:   40.70 ml  24.75 ml/m LA Vol (A4C):   59.7 ml 36.31 ml/m LA Biplane Vol: 59.9 ml 36.43 ml/m  AORTIC VALVE AV Area (Vmax):    0.87 cm AV Area (Vmean):   0.79 cm AV Area (VTI):     0.79 cm AV Vmax:           279.00 cm/s AV Vmean:          197.000 cm/s AV VTI:            0.594 m AV Peak Grad:      31.1 mmHg AV Mean Grad:      18.0 mmHg LVOT Vmax:         77.15 cm/s LVOT Vmean:        49.300 cm/s LVOT VTI:          0.148 m LVOT/AV VTI ratio: 0.25  AORTA Ao Root diam: 3.10 cm Ao Asc diam:  3.20 cm MITRAL VALVE                TRICUSPID VALVE MV Area (PHT): 3.60 cm     TR Peak grad:   15.8 mmHg MV Mean grad:  3.7 mmHg     TR Vmax:        199.00 cm/s MV Decel Time: 211 msec MR Peak grad: 73.6 mmHg     SHUNTS MR Mean grad: 50.0 mmHg     Systemic VTI:  0.15 m MR Vmax:      429.00 cm/s    Systemic Diam: 2.00 cm MR Vmean:     338.0 cm/s MV E velocity: 84.30 cm/s MV A velocity: 148.00 cm/s MV E/A ratio:  0.57 Mahesh  Izora Ribas MD Electronically signed by Riley Lam MD Signature Date/Time: 11/06/2022/5:05:15 PM    Final    DG CHEST PORT 1 VIEW  Result Date: 11/06/2022 CLINICAL DATA:  Chest pain EXAM: PORTABLE CHEST 1 VIEW COMPARISON:  02/04/2020 FINDINGS: The heart size and mediastinal contours are within normal limits. Both lungs are clear. The visualized skeletal structures are unremarkable. IMPRESSION: No active disease. Electronically Signed   By: Alcide Clever M.D.   On: 11/06/2022 03:58    Discharge Medications   Allergies as of 11/07/2022       Reactions   Effexor [venlafaxine] Other (See Comments)   Caused depression, negative thoughts, and crying    Hydrocodone-acetaminophen Itching, Nausea And Vomiting   Patient can tolerate with Zofran        Medication List     TAKE these medications    aspirin EC 81 MG tablet Take 1 tablet (81 mg total) by mouth daily. Swallow whole. Start taking on: Nov 08, 2022   carvedilol 3.125 MG tablet Commonly known as: COREG Take 1 tablet (3.125 mg total) by mouth 2 (two) times daily.   CoQ10 100 MG Caps   ivabradine 5 MG Tabs tablet Commonly known as: Corlanor Take 1 tablet (5 mg total) by mouth 2 (two) times daily with a meal.   Jardiance 10 MG Tabs tablet Generic drug: empagliflozin Take 1 tablet (10 mg total) by mouth daily before breakfast.   losartan 25 MG tablet Commonly known as: COZAAR Take 1 tablet (25 mg total) by mouth daily.   Magnesium 250 MG Tabs Take 250 mg by mouth daily.   mexiletine 150 MG capsule Commonly known as: MEXITIL TAKE 1 CAPSULE BY MOUTH 2 TIMES DAILY.   ondansetron 4 MG tablet Commonly known as: Zofran Take 1 tablet (4 mg total) by mouth every 8 (eight) hours as needed for nausea or vomiting.   rosuvastatin 10 MG tablet Commonly known as: CRESTOR Take 1 tablet (10 mg  total) by mouth at bedtime.   spironolactone 25 MG tablet Commonly known as: ALDACTONE Take 0.5 tablets (12.5 mg total) by mouth daily.   zinc gluconate 50 MG tablet Take 50 mg by mouth daily.        Disposition   The patient will be discharged in stable condition to home. Discharge Instructions     AMB referral to Phase II Cardiac Rehabilitation   Complete by: As directed    Diagnosis: Heart Failure (see criteria below if ordering Phase II)   Heart Failure Type: Chronic Systolic   After initial evaluation and assessments completed: Virtual Based Care may be provided alone or in conjunction with Phase 2 Cardiac Rehab based on patient barriers.: Yes   Intensive Cardiac Rehabilitation (ICR) [facility] location only OR Traditional Cardiac Rehabilitation (TCR) *If criteria for ICR are not met will enroll in TCR [facility] only): Yes       Follow-up Information     [facility] Follow up on 11/19/2022.   Specialty: Cardiology Why: Advanced Heart Failure Clinic 9:30 AM Entrance C, Free Valet Parking Contact information: [address] [id number] mc [place] [address] [phone]                  Duration of Discharge Encounter: Greater than 35 minutes   Signed, Robbie Lis, PA-C  11/07/2022, 4:00 PM

## 2022-11-07 NOTE — Interval H&P Note (Signed)
History and Physical Interval Note:  11/07/2022 12:48 PM  Patricia Collier  has presented today for surgery, with the diagnosis of nstemi.  The various methods of treatment have been discussed with the patient and family. After consideration of risks, benefits and other options for treatment, the patient has consented to  Procedure(s): RIGHT/LEFT HEART CATH AND CORONARY ANGIOGRAPHY (N/A) as a surgical intervention.  The patient's history has been reviewed, patient examined, no change in status, stable for surgery.  I have reviewed the patient's chart and labs.  Questions were answered to the patient's satisfaction.     Lorine Bears

## 2022-11-07 NOTE — Progress Notes (Signed)
   Consider right and left heart cath to fully assess aortic valve. ECHO with moderate low flow low gradient AS.   Donato Schultz, MD

## 2022-11-07 NOTE — Consult Note (Addendum)
Advanced Heart Failure Team Consult Note   Primary Physician: Sheliah Hatch, MD HF Cardiologist: Dr. Gala Romney  Reason for Consultation: Aortic valve stenosis, chronic systolic CHF  HPI:    Patricia Collier is seen today for evaluation of aortic valve stenosis and chronic systolic CHF at the request of Dr. Anne Fu with [facility]. 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  EF as low as 10-15% when she presented with CHF in 2018.   Echo 08/21/21: EF 45-50% (EF 40-45%) with LBBB dyssynchrony. Mild-mod AS mean gradient 13 AVA 1.01 cm2   Seen for f/u 10/09/22. Noting fatigue and intermittent CP as well as episodes of hypotension. Losartan decreased. Repeat echo ordered.   Presented to the ED yesterday AM with tachycardia and SVT. She converted to SR after 12 mg adenosine.  She was hypotensive afterwards and given IVF. She was hypokalemic with K 2.9. Recently had GI illness after a trip associated with diarrhea. Patient admitted to Cardiology. Noting intermittent episodes of CP. HS troponin [phone]. HS troponin felt to be likely 2/2 demand ischemia but has known CAD. Repeat echo showed EF 35-40%, RV okay, decreased LV stroke volume and aortic valve stenosis with mean gradient 18 mmHg and AVA by continuity equaton 0.79, DVI 0.25. Findings concerning for at least moderate low flow low gradient AS. R/LHC recommended to better assess severity of AS and r/o obstructive CAD. Advanced heart failure consulted to assist with management and workup.  She reports gradual decline in activity tolerance in recent months. Can walk on flat surface without difficulty but winded very easily walking up incline or climbing a flight of stairs. Notes occasional chest pressure that is not predictable with exertion and often resolves  with deep breaths. She believes chest pressure correlates with PVCs. No orthopnea, PND or lower extremity edema. Denies dizziness or syncope.    Review of Systems: [y] = yes, [ ]  = no   General: Weight gain [ ] ; Weight loss [ ] ; Anorexia [ ] ; Fatigue [Y]; Fever [ ] ; Chills [ ] ; Weakness [ ]   Cardiac: Chest pain/pressure [Y]; Resting SOB [ ] ; Exertional SOB [Y ]; Orthopnea [ ] ; Pedal Edema [ ] ; Palpitations [ ] ; Syncope [ ] ; Presyncope [ ] ; Paroxysmal nocturnal dyspnea[ ]   Pulmonary: Cough [ ] ; Wheezing[ ] ; Hemoptysis[ ] ; Sputum [ ] ; Snoring [ ]   GI: Vomiting[ ] ; Dysphagia[ ] ; Melena[ ] ; Hematochezia [ ] ; Heartburn[ ] ; Abdominal pain [ ] ; Constipation [ ] ; Diarrhea [ ] ; BRBPR [ ]   GU: Hematuria[ ] ; Dysuria [ ] ; Nocturia[ ]   Vascular: Pain in legs with walking [ ] ; Pain in feet with lying flat [ ] ; Non-healing sores [ ] ; Stroke [ ] ; TIA [ ] ; Slurred speech [ ] ;  Neuro: Headaches[ ] ; Vertigo[ ] ; Seizures[ ] ; Paresthesias[ ] ;Blurred vision [ ] ; Diplopia [ ] ; Vision changes [ ]   Ortho/Skin: Arthritis [ ] ; Joint pain [ ] ; Muscle pain [ ] ; Joint swelling [ ] ; Back Pain [ ] ; Rash [ ]   Psych: Depression[ ] ; Anxiety[ ]   Heme: Bleeding problems [ ] ; Clotting disorders [ ] ; Anemia [ ]   Endocrine: Diabetes [ ] ; Thyroid dysfunction[ ]   Home Medications Prior to Admission medications   Medication Sig Start Date End Date Taking? Authorizing Provider  carvedilol (COREG) 3.125 MG tablet Take 1 tablet (3.125 mg total) by mouth 2 (  two) times daily. 11/02/22  Yes Ghina Bittinger, Bevelyn Buckles, MD  Coenzyme Q10 (COQ10) 100 MG CAPS  08/10/20  Yes [provider]  empagliflozin (JARDIANCE) 10 MG TABS tablet Take 1 tablet (10 mg total) by mouth daily before breakfast. 05/25/22  Yes Bronsen Serano, Bevelyn Buckles, MD  ivabradine (CORLANOR) 5 MG TABS tablet Take 1 tablet (5 mg total) by mouth 2 (two) times daily with a meal. 11/02/22  Yes Nakyla Bracco, Bevelyn Buckles, MD  losartan (COZAAR) 25 MG tablet Take 1 tablet (25 mg total) by mouth  daily. 10/09/22  Yes Karmello Abercrombie, Bevelyn Buckles, MD  Magnesium 250 MG TABS Take 250 mg by mouth daily.   Yes [provider]  mexiletine (MEXITIL) 150 MG capsule TAKE 1 CAPSULE BY MOUTH 2 TIMES DAILY. 11/17/21 11/26/22 Yes Maddyn Lieurance, Bevelyn Buckles, MD  ondansetron (ZOFRAN) 4 MG tablet Take 1 tablet (4 mg total) by mouth every 8 (eight) hours as needed for nausea or vomiting. 10/09/22  Yes Sheliah Hatch, MD  rosuvastatin (CRESTOR) 10 MG tablet Take 1 tablet (10 mg total) by mouth at bedtime. 06/27/22  Yes Sheliah Hatch, MD  spironolactone (ALDACTONE) 25 MG tablet Take 0.5 tablets (12.5 mg total) by mouth daily. 03/26/22  Yes Valora Norell, Bevelyn Buckles, MD  zinc gluconate 50 MG tablet Take 50 mg by mouth daily.   Yes [provider]    Past Medical History: Past Medical History:  Diagnosis Date   Arthritis    ostearthritis -joints, greater left knee.   Breast cancer [facility])    Breast cancer, left breast (HCC) 2009   S/P surgery, radiation, chemotherapy.-no further oncology visits.   CHF (congestive heart failure) (HCC) dx'd 02/05/2017   Hyperlipidemia    Hypertension    Neuromuscular disorder (HCC)    neuropathy due to chemotherapy   Personal history of chemotherapy 2009   Personal history of radiation therapy 2009   Seizures (HCC)    "childbirth related"   Sleep apnea    mild no C-PAP   SVT (supraventricular tachycardia)    x1 episode -3 yrs ago- no issues since.All heart testing negative. No further cardiac visits needed.    Past Surgical History: Past Surgical History:  Procedure Laterality Date   ABDOMINAL HYSTERECTOMY  1984   BILATERAL SALPINGOOPHORECTOMY Bilateral 2000s   BREAST BIOPSY Left 2009   BREAST LUMPECTOMY Left 2009   sentinel node dissection   BUNIONECTOMY Bilateral early 2000s   CARDIAC CATHETERIZATION  2015   DIAGNOSTIC LAPAROSCOPY  1990s-early 2000s   multiple for adhesions   DILATION AND CURETTAGE OF UTERUS  2000   w/laparoscopies   HAMMER TOE SURGERY  Right 09/2015; 06/22/2016; 12/28/2016   2nd digit   KNEE ARTHROSCOPY Left 09/21/2015   Procedure: LEFT ARTHROSCOPY KNEE WITH LATERAL MENISCUS DEBRIDEMENT;  Surgeon: Ollen Gross, MD;  Location: [facility];  Service: Orthopedics;  Laterality: Left;   KNEE ARTHROSCOPY Right 10/09/2021   Procedure: Right knee arthroscopy; lateral meniscal debridement;  Surgeon: Ollen Gross, MD;  Location: [facility];  Service: Orthopedics;  Laterality: Right;   KNEE SURGERY  10/09/2021   Right knee   LAPAROSCOPIC CHOLECYSTECTOMY     RHINOPLASTY  1979   RIGHT/LEFT HEART CATH AND CORONARY ANGIOGRAPHY N/A 02/06/2017   Procedure: Right/Left Heart Cath and Coronary Angiography;  Surgeon: Dolores Patty, MD;  Location: [facility] INVASIVE CV [facility];  Service: Cardiovascular;  Laterality: N/A;   TONSILLECTOMY AND ADENOIDECTOMY  1955   TRANSANAL RECTOPEXY  1990s    Family History: Family History  Problem Relation  Age of Onset   Heart disease Mother    Hypertension Father    Breast cancer Sister    Lung cancer Sister    Skin cancer Brother     Social History: Social History   Socioeconomic History   Marital status: Married    Spouse name: Not on file   Number of children: 2   Years of education: Not on file   Highest education level: Not on file  Occupational History   Occupation: anesthetist    Employer: [employer]    Comment: retired June 2019  Tobacco Use   Smoking status: Former    Packs/day: 2.00    Years: 10.00    Additional pack years: 0.00    Total pack years: 20.00    Types: Cigarettes    Quit date: 07/09/1989    Years since quitting: 33.3   Smokeless tobacco: Never  Vaping Use   Vaping Use: Never used  Substance and Sexual Activity   Alcohol use: No   Drug use: No   Sexual activity: Yes  Other Topics Concern   Not on file  Social History Narrative   Patient with caregiver stress due to spouses declining health   Some conflicts with children and addictions (coping well)    Social  Determinants of Health   Financial Resource Strain: Low Risk  (06/06/2022)   Overall Financial Resource Strain (CARDIA)    Difficulty of Paying Living Expenses: Not hard at all  Food Insecurity: No Food Insecurity (11/06/2022)   Hunger Vital Sign    Worried About Running Out of Food in the Last Year: Never true    Ran Out of Food in the Last Year: Never true  Transportation Needs: No Transportation Needs (11/06/2022)   PRAPARE - Administrator, Civil Service (Medical): No    Lack of Transportation (Non-Medical): No  Physical Activity: Inactive (06/06/2022)   Exercise Vital Sign    Days of Exercise per Week: 0 days    Minutes of Exercise per Session: 0 min  Stress: Stress Concern Present (06/06/2022)   [employer] of Occupational Health - Occupational Stress Questionnaire    Feeling of Stress : To some extent  Social Connections: Moderately Integrated (06/06/2022)   Social Connection and Isolation Panel [NHANES]    Frequency of Communication with Friends and Family: More than three times a week    Frequency of Social Gatherings with Friends and Family: Once a week    Attends Religious Services: Never    Database administrator or Organizations: Yes    Attends Engineer, structural: More than 4 times per year    Marital Status: Married    Allergies:  Allergies  Allergen Reactions   Effexor [Venlafaxine] Other (See Comments)    Caused depression, negative thoughts, and crying    Hydrocodone-Acetaminophen Itching and Nausea And Vomiting    Patient can tolerate with Zofran    Objective:    Vital Signs:   Temp:  [97.6 F (36.4 C)-98.5 F (36.9 C)] 97.7 F (36.5 C) (05/01 0839) Pulse Rate:  [67-100] 71 (05/01 0839) Resp:  [10-22] 16 (05/01 0839) BP: (75-114)/(44-78) 111/70 (05/01 0839) SpO2:  [93 %-100 %] 98 % (05/01 0839) Weight:  [61.7 kg-61.9 kg] 61.7 kg (05/01 0405) Last BM Date : 11/04/22  Weight change: Filed Weights   11/06/22 1753 11/07/22  0405  Weight: 61.9 kg 61.7 kg    Intake/Output:   Intake/Output Summary (Last 24 hours) at 11/07/2022 0935 Last  data filed at 11/06/2022 1445 Gross per 24 hour  Intake 325 ml  Output 775 ml  Net -450 ml      Physical Exam    General:  Well appearing. No resp difficulty HEENT: normal Neck: supple. JVP . Carotids 2+ bilat; no bruits. No lymphadenopathy or thyromegaly appreciated. Cor: PMI nondisplaced. Regular rate & rhythm. No rubs, gallops or murmurs. Lungs: clear Abdomen: soft, nontender, nondistended. No hepatosplenomegaly. No bruits or masses. Good bowel sounds. Extremities: no cyanosis, clubbing, rash, edema Neuro: alert & orientedx3, cranial nerves grossly intact. moves all 4 extremities w/o difficulty. Affect pleasant   Telemetry  SR 70s  EKG    After adenosine: Sinus 94 bpm, LBBB  Labs   Basic Metabolic Panel: Recent Labs  Lab 11/06/22 0344 11/06/22 1028 11/06/22 1841 11/07/22 0448  NA 137 141  --  140  K 2.9* 3.2*  --  3.9  CL 103 109  --  110  CO2 21* 20*  --  22  GLUCOSE 165* 96  --  92  BUN 15 11  --  8  CREATININE 0.88 0.54 0.62 0.55  CALCIUM 8.9 8.4*  --  8.7*  MG 1.8  --   --   --     Liver Function Tests: Recent Labs  Lab 11/06/22 0344  AST 39  ALT 40  ALKPHOS 91  BILITOT 0.4  PROT 6.2*  ALBUMIN 3.5   No results for input(s): "LIPASE", "AMYLASE" in the last 168 hours. No results for input(s): "AMMONIA" in the last 168 hours.  CBC: Recent Labs  Lab 11/06/22 0344 11/06/22 1841 11/07/22 0448  WBC 5.3 4.1 4.3  NEUTROABS 2.9  --   --   HGB 14.5 13.5 13.1  HCT 43.4 39.5 37.6  MCV 92.3 91.2 90.4  PLT 193 185 168    Cardiac Enzymes: No results for input(s): "CKTOTAL", "CKMB", "CKMBINDEX", "TROPONINI" in the last 168 hours.  BNP: BNP (last 3 results) Recent Labs    11/16/21 1449 11/06/22 0344  BNP 77.4 196.1*    ProBNP (last 3 results) No results for input(s): "PROBNP" in the last 8760 hours.   CBG: No results for  input(s): "GLUCAP" in the last 168 hours.  Coagulation Studies: Recent Labs    11/06/22 0344  LABPROT 14.7  INR 1.2     Imaging   ECHOCARDIOGRAM COMPLETE  Result Date: 11/06/2022    ECHOCARDIOGRAM REPORT   Patient Name:   Patricia Collier Date of Exam: 11/06/2022 Medical Rec #:  [id number]          Height:       63.0 in Accession #:    [id number]         Weight:       136.6 lb Date of Birth:  04-11-1946         BSA:          1.644 m Patient Age:    76 years           BP:           98/61 mmHg Patient Gender: F                  HR:           76 bpm. Exam Location:  Inpatient Procedure: 2D Echo, Cardiac Doppler and Color Doppler Indications:    chest pain  History:        Patient has prior history of Echocardiogram examinations, most  recent 08/21/2021. Cardiomyopathy, Aortic Valve Disease;                 Arrythmias:PVC and SVT.  Sonographer:    Delcie Roch RDCS Referring Phys: [id number] Cyndi Bender IMPRESSIONS  1. Septal and apical wall motion abnormalities; no LV thrombus noted. Left ventricular ejection fraction, by estimation, is 35 to 40%. The left ventricle has moderately decreased function. The left ventricle demonstrates regional wall motion abnormalities (see scoring diagram/findings for description). There is mild asymmetric left ventricular hypertrophy of the basal-septal segment. Left ventricular diastolic parameters are consistent with Grade I diastolic dysfunction (impaired relaxation).  2. Right ventricular systolic function is normal. The right ventricular size is normal. There is normal pulmonary artery systolic pressure.  3. Left atrial size was mildly dilated.  4. The mitral valve is degenerative. Mild mitral valve regurgitation. No evidence of mitral stenosis. The mean mitral valve gradient is 3.7 mmHg with average heart rate of 78 bpm.  5. Decreased LV stroke volume. Mean gradient 18 mm Hg. AVA by continuity equation 0.79. DVI 0.25. Overall suggestive of at least  moderate low flow low gradient aortic stenosis.. The aortic valve is calcified. Aortic valve regurgitation is trivial. Moderate aortic valve stenosis. Aortic valve area, by VTI measures 0.79 cm. Aortic valve mean gradient measures 18.0 mmHg.  6. The inferior vena cava is normal in size with greater than 50% respiratory variability, suggesting right atrial pressure of 3 mmHg. Comparison(s): Prior images reviewed side by side. Jule Economy appears visually worse from prior. FINDINGS  Left Ventricle: Septal and apical wall motion abnormalities; no LV thrombus noted. Left ventricular ejection fraction, by estimation, is 35 to 40%. The left ventricle has moderately decreased function. The left ventricle demonstrates regional wall motion abnormalities. The left ventricular internal cavity size was normal in size. There is mild asymmetric left ventricular hypertrophy of the basal-septal segment. Left ventricular diastolic parameters are consistent with Grade I diastolic dysfunction  (impaired relaxation).  LV Wall Scoring: The apical lateral segment, mid inferoseptal segment, apical septal segment, apical anterior segment, and apical inferior segment are hypokinetic. Right Ventricle: The right ventricular size is normal. No increase in right ventricular wall thickness. Right ventricular systolic function is normal. There is normal pulmonary artery systolic pressure. The tricuspid regurgitant velocity is 1.99 m/s, and  with an assumed right atrial pressure of 3 mmHg, the estimated right ventricular systolic pressure is 18.8 mmHg. Left Atrium: Left atrial size was mildly dilated. Right Atrium: Right atrial size was normal in size. Pericardium: There is no evidence of pericardial effusion. Mitral Valve: The mitral valve is degenerative in appearance. Mild mitral valve regurgitation. No evidence of mitral valve stenosis. The mean mitral valve gradient is 3.7 mmHg with average heart rate of 78 bpm. Tricuspid Valve: The tricuspid  valve is normal in structure. Tricuspid valve regurgitation is trivial. No evidence of tricuspid stenosis. Aortic Valve: Decreased LV stroke volume. Mean gradient 18 mm Hg. AVA by continuity equation 0.79. DVI 0.25. Overall suggestive of at least moderate low flow low gradient aortic stenosis. The aortic valve is calcified. Aortic valve regurgitation is trivial. Moderate aortic stenosis is present. Aortic valve mean gradient measures 18.0 mmHg. Aortic valve peak gradient measures 31.1 mmHg. Aortic valve area, by VTI measures 0.79 cm. Pulmonic Valve: The pulmonic valve was not well visualized. Pulmonic valve regurgitation is not visualized. Aorta: The aortic root and ascending aorta are structurally normal, with no evidence of dilitation. Venous: The inferior vena cava is normal in size with greater  than 50% respiratory variability, suggesting right atrial pressure of 3 mmHg. IAS/Shunts: The atrial septum is grossly normal.  LEFT VENTRICLE PLAX 2D LVIDd:         4.80 cm      Diastology LVIDs:         4.20 cm      LV e' medial:    5.44 cm/s LV PW:         0.90 cm      LV E/e' medial:  15.5 LV IVS:        0.90 cm      LV e' lateral:   7.83 cm/s LVOT diam:     2.00 cm      LV E/e' lateral: 10.8 LV SV:         47 LV SV Index:   28 LVOT Area:     3.14 cm  LV Volumes (MOD) LV vol d, MOD A4C: 117.0 ml LV vol s, MOD A4C: 78.5 ml LV SV MOD A4C:     117.0 ml RIGHT VENTRICLE            IVC RV Basal diam:  2.70 cm    IVC diam: 1.20 cm RV S prime:     9.90 cm/s TAPSE (M-mode): 1.7 cm LEFT ATRIUM             Index        RIGHT ATRIUM           Index LA diam:        3.90 cm 2.37 cm/m   RA Area:     15.20 cm LA Vol (A2C):   54.7 ml 33.26 ml/m  RA Volume:   40.70 ml  24.75 ml/m LA Vol (A4C):   59.7 ml 36.31 ml/m LA Biplane Vol: 59.9 ml 36.43 ml/m  AORTIC VALVE AV Area (Vmax):    0.87 cm AV Area (Vmean):   0.79 cm AV Area (VTI):     0.79 cm AV Vmax:           279.00 cm/s AV Vmean:          197.000 cm/s AV VTI:             0.594 m AV Peak Grad:      31.1 mmHg AV Mean Grad:      18.0 mmHg LVOT Vmax:         77.15 cm/s LVOT Vmean:        49.300 cm/s LVOT VTI:          0.148 m LVOT/AV VTI ratio: 0.25  AORTA Ao Root diam: 3.10 cm Ao Asc diam:  3.20 cm MITRAL VALVE                TRICUSPID VALVE MV Area (PHT): 3.60 cm     TR Peak grad:   15.8 mmHg MV Mean grad:  3.7 mmHg     TR Vmax:        199.00 cm/s MV Decel Time: 211 msec MR Peak grad: 73.6 mmHg     SHUNTS MR Mean grad: 50.0 mmHg     Systemic VTI:  0.15 m MR Vmax:      429.00 cm/s   Systemic Diam: 2.00 cm MR Vmean:     338.0 cm/s MV E velocity: 84.30 cm/s MV A velocity: 148.00 cm/s MV E/A ratio:  0.57 Riley Lam MD Electronically signed by Riley Lam MD Signature Date/Time: 11/06/2022/5:05:15 PM    Final  Medications:     Current Medications:  aspirin EC  81 mg Oral Daily   empagliflozin  10 mg Oral QAC breakfast   heparin  5,000 Units Subcutaneous Q8H   ivabradine  5 mg Oral BID WC   mexiletine  150 mg Oral Q12H   rosuvastatin  10 mg Oral Daily   sodium chloride flush  3 mL Intravenous Q12H    Infusions:  sodium chloride     sodium chloride        Patient Profile   77 y.o. female known to HF service with history of chronic systolic CHF/NICM, LBBB, aortic valve stenosis, CAD, PVCs, breast cancer s/p lumpectmy and chemo  Assessment/Plan   Aortic valve stenosis: -Echo this admission with EF 35-40%, decreased LV stroke volume. At least moderate low flow low gradient AS with mean gradient of 18 mmHg, AVA by continuity equation 0.79 and DVI 0.25.  -? Progressive AS etiology for decline in functional capacity and EF -Going for [facility] today  2. Chronic systolic CHF -NICM, EF 15-20%, felt to be related to doxorubicin therapy. But also with frequent PVC's, holter monitor showed 1% PVC's but patient complained of very frequent PVC's.  - Cath 8/18 mild non-obs CAD. LAD 20%, OM-1 60% RCA 20% - Echo 05/2017 ~30-35%.  - Echo 03/09/18 EF  45-50% - CPX 01/2018 Peak VO2: 21.5 (108% predicted peak VO2) VE/VCO2 slope:  48 Peak RER: 0.93 - Echo 10/20 EF 40-45% mild to moderate AS - Echo 8/21 EF 50-55% - Echo  08/21/21: EF 45-50% (I thought EF 40-45%) with LBBB dyssynchrony. Mild-mod AS mean gradient 13 AVA 1.01 cm2  - Echo this admit EF 35-40%, LBBB dyssynchrony, at least moderate low flow low gradient AS - ? Worsening AS vs tachy-mediated from SVT vs LBBB as etiology for decline in LV function. Also has elevate HS troponin. R/L cath today as above. - Recent decline in functional capacity. NYHA II/early III. - Volume status ok - Home spiro, losartan and coreg initially held d/t hypotension during episode SVT. Add back as blood pressure allows. Slowly improving. - Continue Jardiance 10 - Continue ivabradine - Can consider CRT if other etiologies for worsening LV function/symptoms ruled out  3. Elevated troponin Chest pain -Troponin peaked at 1,087. Possibly d/t demand ischemia from episode SVT.  -Reporting intermittent chest pressure which is somewhat atypical. Not clearly predictable with exertion. However, also noting worsening dyspnea with exertion which could be anginal equivalent. -Cath planned for today -Continue aspirin and statin  4. SVT -Terminated with adenosine in ED -Currently in SR  5. PVCs - Holter monitor 03/2017 with PVC burden <1%. - Zio 9/21 PVCs 1.3% (monomorphic) - PVCs suppressed with mexilitene 150 bid.Off amio - She feels PVCs have been well suppressed but maybe more frequent last few days  Length of Stay: 1  FINCH, LINDSAY N, PA-C  11/07/2022, 9:35 AM  Advanced Heart Failure Team Pager [phone] (M-F; 7a - 5p)  Please contact [facility] for night-coverage after hours (4p -7a ) and weekends on [email or web address]   Patient seen and examined with the above-signed Advanced Practice Provider and/or Housestaff. I personally reviewed laboratory data, imaging studies and relevant notes. I independently examined  the patient and formulated the important aspects of the plan. I have edited the note to reflect any of my changes or salient points. I have personally discussed the plan with the patient and/or family.  77 y/o female with h/o frequent PVCs and NICM. Admitted with SVR rates ~220. Terminated with  adenosine. Subsequently bumped troponin.   Echo EF 35-40%  Cath today minimal CAD. RHC ok   General:  Well appearing. No resp difficulty HEENT: normal Neck: supple. no JVD. Carotids 2+ bilat; no bruits. No lymphadenopathy or thryomegaly appreciated. Cor: PMI nondisplaced. Regular rate & rhythm. No rubs, gallops or murmurs. Lungs: clear Abdomen: soft, nontender, nondistended. No hepatosplenomegaly. No bruits or masses. Good bowel sounds. Extremities: no cyanosis, clubbing, rash, edema Neuro: alert & orientedx3, cranial nerves grossly intact. moves all 4 extremities w/o difficulty. Affect pleasant  She looks good. Cath films reviewed personally. Reassuring. That said EF is down a bit. She has moderate AS.  Will continue to titrate GDMT.   If post-cath course stable likely can go home later today.   Arvilla Meres, MD  2:50 PM

## 2022-11-08 ENCOUNTER — Ambulatory Visit: Payer: Self-pay

## 2022-11-08 ENCOUNTER — Telehealth: Payer: Self-pay

## 2022-11-08 ENCOUNTER — Other Ambulatory Visit (HOSPITAL_COMMUNITY): Payer: Self-pay

## 2022-11-08 ENCOUNTER — Encounter (HOSPITAL_COMMUNITY): Payer: Self-pay | Admitting: Cardiovascular Disease

## 2022-11-08 NOTE — Chronic Care Management (AMB) (Signed)
   11/08/2022  Patricia Collier 04/15/1946 [id number]  Reason for Encounter: Patient is not currently enrolled in the CCM program. CCM status changed to previously enrolled.   Katha Cabal RN Care Manager/Chronic Care Management [phone]

## 2022-11-08 NOTE — Transitions of Care (Post Inpatient/ED Visit) (Signed)
11/08/2022  Name: Patricia Collier MRN: [id number] DOB: 11/23/1945  Today's TOC FU Call Status: Today's TOC FU Call Status:: Successful TOC FU Call Competed TOC FU Call Complete Date: 11/08/22  Transition Care Management Follow-up Telephone Call Date of Discharge: 11/07/22 Discharge Facility: [facility]) Type of Discharge: Inpatient Admission Primary Inpatient Discharge Diagnosis:: "SVT" How have you been since you were released from the hospital?: Better (Pt states she is "doing great-currently at hair salon getting hair done." She voices that she is drinking and eating well. She has had no issues since returning home.) Any questions or concerns?: No  Items Reviewed: Did you receive and understand the discharge instructions provided?: Yes Medications obtained,verified, and reconciled?: No Medications Not Reviewed Reasons:: Other: (pt currentlty at hair salon-unable to complete med review-states she will pick up ASA after getting hair done) Any new allergies since your discharge?: No Dietary orders reviewed?: Yes Type of Diet Ordered:: low salt/heart healthy Do you have support at home?: Yes People in Home: spouse Name of Support/Comfort Primary Source: pt states spouse currently battling bladder CA  Medications Reviewed Today: Medications Reviewed Today     Reviewed by Iran Ouch, MD (Physician) on 11/07/22 at 1247  Med List Status: Complete   Medication Order Taking? Sig Documenting Provider Last Dose Status Informant  carvedilol (COREG) 3.125 MG tablet [id number] Yes Take 1 tablet (3.125 mg total) by mouth 2 (two) times daily. Bensimhon, Bevelyn Buckles, MD 11/05/2022 2200 Active Self, Pharmacy Records  Coenzyme Q10 (COQ10) 100 MG CAPS [id number] Yes  [provider] Past Month Active Self, Pharmacy Records  empagliflozin (JARDIANCE) 10 MG TABS tablet [id number] Yes Take 1 tablet (10 mg total) by mouth daily before breakfast. Bensimhon, Bevelyn Buckles, MD 11/05/2022 Active  Self, Pharmacy Records  ivabradine (CORLANOR) 5 MG TABS tablet [id number] Yes Take 1 tablet (5 mg total) by mouth 2 (two) times daily with a meal. Bensimhon, Bevelyn Buckles, MD 11/05/2022 Active Self, Pharmacy Records  losartan (COZAAR) 25 MG tablet [id number] Yes Take 1 tablet (25 mg total) by mouth daily. Bensimhon, Bevelyn Buckles, MD 11/05/2022 Active Self, Pharmacy Records  Magnesium 250 MG TABS [id number] Yes Take 250 mg by mouth daily. [provider] Past Month Active Self, Pharmacy Records  mexiletine (MEXITIL) 150 MG capsule [id number] Yes TAKE 1 CAPSULE BY MOUTH 2 TIMES DAILY. Bensimhon, Bevelyn Buckles, MD 11/05/2022 Active Self, Pharmacy Records  ondansetron [facility]) 4 MG tablet [id number] Yes Take 1 tablet (4 mg total) by mouth every 8 (eight) hours as needed for nausea or vomiting. Sheliah Hatch, MD Unknown Active Self, Pharmacy Records  rosuvastatin (CRESTOR) 10 MG tablet [id number] Yes Take 1 tablet (10 mg total) by mouth at bedtime. Sheliah Hatch, MD 11/05/2022 Active Self, Pharmacy Records  spironolactone (ALDACTONE) 25 MG tablet [id number] Yes Take 0.5 tablets (12.5 mg total) by mouth daily. Bensimhon, Bevelyn Buckles, MD 11/05/2022 Active Self, Pharmacy Records  zinc gluconate 50 MG tablet [id number] Yes Take 50 mg by mouth daily. [provider] Past Month Active Self, Pharmacy Records            Home Care and Equipment/Supplies: Were Home Health Services Ordered?: NA Any new equipment or medical supplies ordered?: NA  Functional Questionnaire: Do you need assistance with bathing/showering or dressing?: No Do you need assistance with meal preparation?: No Do you need assistance with eating?: No Do you have difficulty maintaining continence: No Do you need assistance with getting out of bed/getting out of a chair/moving?: No Do  you have difficulty managing or taking your medications?: No  Follow up appointments reviewed: PCP Follow-up appointment confirmed?: NA Specialist  Hospital Follow-up appointment confirmed?: Yes Date of Specialist follow-up appointment?: 11/19/22 Follow-Up Specialty Provider:: [facility] Do you need transportation to your follow-up appointment?: No Do you understand care options if your condition(s) worsen?: Yes-patient verbalized understanding  SDOH Interventions Today    Flowsheet Row Most Recent Value  SDOH Interventions   Food Insecurity Interventions Intervention Not Indicated  Transportation Interventions Intervention Not Indicated      TOC Interventions Today    Flowsheet Row Most Recent Value  TOC Interventions   TOC Interventions Discussed/Reviewed TOC Interventions Discussed, Post discharge activity limitations per provider       Interventions Today    Flowsheet Row Most Recent Value  Chronic Disease   Chronic disease during today's visit Congestive Heart Failure (CHF)  General Interventions   General Interventions Discussed/Reviewed General Interventions Discussed, Doctor Visits, Durable Medical Equipment (DME)  Doctor Visits Discussed/Reviewed Doctor Visits Discussed, PCP, Specialist  Durable Medical Equipment (DME) BP Cuff  [pt reports she has been checking BP several times per day and recording values-BP has been in the 90s-115s]  PCP/Specialist Visits Compliance with follow-up visit  Exercise Interventions   Exercise Discussed/Reviewed Exercise Discussed  Education Interventions   Education Provided Provided Education  Provided Verbal Education On Nutrition, When to see the doctor, Medication, Other  [sx mgmt]  Nutrition Interventions   Nutrition Discussed/Reviewed Nutrition Discussed, Adding fruits and vegetables, Decreasing salt  Pharmacy Interventions   Pharmacy Dicussed/Reviewed Pharmacy Topics Discussed, Medications and their functions  Safety Interventions   Safety Discussed/Reviewed Safety Discussed        Alessandra Grout [facility] Care Management Care  Management Community Coordinator Direct Phone: [phone] Toll Free: [phone] Fax: [phone]

## 2022-11-18 NOTE — Progress Notes (Signed)
Advanced Heart Failure Clinic Note   Primary Cardiologist: Dr. Gala Romney   HPI: Patricia Collier is a 77 y.o. 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 EF 15-20%. Right and left heart cath done and showed  moderate non-obstructive CAD Fick output/index 3.1/1.8.  Holter monitor 8/18: Multiform PVCs, couplets, and triplets were noted with periods of bigeminy. PVC burden < 1%.No VT observed.  Echo 9/19: EF 45-50% moderate AS mean gradient Echo 8/21: EF 50-55% mild to moderate AS mean  Zio 11/21: SR 72. 1.3% PVCs. Marland Kitchen   Echo  08/21/21: EF 45-50% (EF 40-45%) with LBBB dyssynchrony. Mild-mod AS mean gradient 13 AVA 1.01 cm2   Admitted 4/30-11/07/2022 with SVT.  Terminated with adenosine.  Underwent echo EF 35 to 40%.  Troponins elevated so underwent left/right heart cath, minimal CAD.  Plan to continue GDMT titration.  Discharge weight 135.7 lbs.  Today she returns for post hospital follow up. Overall feeling good. Denies palpitations, CP, dizziness, edema, or PND/Orthopnea. Wakes up a lot during the night to urinate. No SOB. Appetite good, watches what she eats. No fever or chills. Weight at home 134 pounds. Taking all medications. SBP 80s-100s. Walking 1.5-1.75 miles twice a day. Has played golf a couple of times.   EKG NSR 65 bpm (Personally reviewed)     Cardiac studies:  4/24  Echo EF 35-40% and mod AS (Mean gradient 18 mm Hg. AVA by continuity  equation 0.79. DVI 0.25).  4/24 R/LHC which demonstrated minimal CAD (1st Mrg lesion 30% stenosed). RHC ok, RA: 1 mmHg, RV: 19/3 mmHg, PA: 22/10, Cardiac output is 4.24 with a cardiac index of 2.57.  Moderate aortic  stenosis with peak to peak gradient of 11 mmHg, mean gradient of 19 mmHg and valve area of 1.1 cm  Zio 11/21 1. Sinus rhythm - avg HR of 72 2. Isolated PACs were rare 3. Occasional PVCs  (1.3%, 17991) 4. No high-grade arrhythmias 5. Patient triggered events associated with isolated PVCs.  CPX 01/2018 Peak VO2: 21.5 (108% predicted peak VO2) VE/VCO2 slope:  48 OUES: 1.84 Peak RER: 0.93 Echo 05/09/17 EF ~30% RV ok. Personally reviewed Echo 02/05/17 LVEF 15-20%, Grade 1 DD, Mild/Mod central MR, Mild LAE R/LHC 02/06/17 There is severe left ventricular systolic dysfunction. Dist Cx lesion, 20 %stenosed. 1st Mrg lesion, 60 %stenosed. Dist RCA lesion, 20 %stenosed. Mid LAD to Dist LAD lesion, 20 %stenosed.  Cardiac MRI 06/2017 1.  Moderate LVE with diffuse hypokinesis EF 35% 2. Small area of apical hyperenhancement on delayed gadolinium images 3.  Mild MR 4.  Mild LAE 5. Thickened tri leaflet AV with restricted leaflet motion suggest echo for AS correlation 6.  Normal RV size and function 7.  Normal aortic root 3.0 cm  Review of systems complete and found to be negative unless listed in HPI.   Past Medical History:  Diagnosis Date   Arthritis    ostearthritis -joints, greater left knee.   Breast cancer [facility])    Breast cancer, left breast (HCC) 2009   S/P surgery, radiation, chemotherapy.-no further oncology visits.   CHF (congestive heart failure) (HCC) dx'd 02/05/2017   Hyperlipidemia    Hypertension    Neuromuscular disorder (HCC)  neuropathy due to chemotherapy   Personal history of chemotherapy 2009   Personal history of radiation therapy 2009   Seizures (HCC)    "childbirth related"   Sleep apnea    mild no C-PAP   SVT (supraventricular tachycardia)    x1 episode -3 yrs ago- no issues since.All heart testing negative. No further cardiac visits needed.   Current Outpatient Medications  Medication Sig Dispense Refill   aspirin EC 81 MG tablet Take 1 tablet (81 mg  total) by mouth daily. Swallow whole. 30 tablet 12   carvedilol (COREG) 3.125 MG tablet Take 1 tablet (3.125 mg total) by mouth 2 (two) times daily. 60 tablet 3   Coenzyme Q10 (COQ10) 100 MG CAPS Take 1 tablet by mouth daily.     empagliflozin (JARDIANCE) 10 MG TABS tablet Take 1 tablet (10 mg total) by mouth daily before breakfast. 90 tablet 1   ivabradine (CORLANOR) 5 MG TABS tablet Take 1 tablet (5 mg total) by mouth 2 (two) times daily with a meal. 60 tablet 3   losartan (COZAAR) 25 MG tablet Take 1 tablet (25 mg total) by mouth daily. 60 tablet 11   Magnesium 250 MG TABS Take 250 mg by mouth daily.     mexiletine (MEXITIL) 150 MG capsule TAKE 1 CAPSULE BY MOUTH 2 TIMES DAILY. 180 capsule 3   ondansetron (ZOFRAN) 4 MG tablet Take 1 tablet (4 mg total) by mouth every 8 (eight) hours as needed for nausea or vomiting. 30 tablet 1   rosuvastatin (CRESTOR) 10 MG tablet Take 1 tablet (10 mg total) by mouth at bedtime. 90 tablet 1   spironolactone (ALDACTONE) 25 MG tablet Take 0.5 tablets (12.5 mg total) by mouth daily. 45 tablet 3   zinc gluconate 50 MG tablet Take 50 mg by mouth daily.     No current facility-administered medications for this encounter.   Allergies  Allergen Reactions   Effexor [Venlafaxine] Other (See Comments)    Caused depression, negative thoughts, and crying    Hydrocodone-Acetaminophen Itching and Nausea And Vomiting    Patient can tolerate with Zofran    Social History   Socioeconomic History   Marital status: Married    Spouse name: Not on file   Number of children: 2   Years of education: Not on file   Highest education level: Not on file  Occupational History   Occupation: anesthetist    Employer: [employer]    Comment: retired June 2019  Tobacco Use   Smoking status: Former    Packs/day: 2.00    Years: 10.00    Additional pack years: 0.00    Total pack years: 20.00    Types: Cigarettes    Quit date: 07/09/1989    Years since quitting: 33.3    Smokeless tobacco: Never  Vaping Use   Vaping Use: Never used  Substance and Sexual Activity   Alcohol use: No   Drug use: No   Sexual activity: Yes  Other Topics Concern   Not on file  Social History Narrative   Patient with caregiver stress due to spouses declining health   Some conflicts with children and addictions (coping well)    Social Determinants of Health   Financial Resource Strain: Low Risk  (06/06/2022)   Overall Financial Resource Strain (CARDIA)    Difficulty of Paying Living Expenses: Not hard at all  Food Insecurity: No Food Insecurity (11/08/2022)   Hunger Vital Sign    Worried About Running Out of Food  in the Last Year: Never true    Ran Out of Food in the Last Year: Never true  Transportation Needs: No Transportation Needs (11/08/2022)   PRAPARE - Administrator, Civil Service (Medical): No    Lack of Transportation (Non-Medical): No  Physical Activity: Inactive (06/06/2022)   Exercise Vital Sign    Days of Exercise per Week: 0 days    Minutes of Exercise per Session: 0 min  Stress: Stress Concern Present (06/06/2022)   [employer] of Occupational Health - Occupational Stress Questionnaire    Feeling of Stress : To some extent  Social Connections: Moderately Integrated (06/06/2022)   Social Connection and Isolation Panel [NHANES]    Frequency of Communication with Friends and Family: More than three times a week    Frequency of Social Gatherings with Friends and Family: Once a week    Attends Religious Services: Never    Database administrator or Organizations: Yes    Attends Engineer, structural: More than 4 times per year    Marital Status: Married  Catering manager Violence: Not At Risk (11/06/2022)   Humiliation, Afraid, Rape, and Kick questionnaire    Fear of Current or Ex-Partner: No    Emotionally Abused: No    Physically Abused: No    Sexually Abused: No   Family History  Problem Relation Age of Onset   Heart disease  Mother    Hypertension Father    Breast cancer Sister    Lung cancer Sister    Skin cancer Brother    Vitals:   11/19/22 0947  BP: 110/70  Pulse: 65  SpO2: 100%  Weight: 61.2 kg (135 lb)    Wt Readings from Last 3 Encounters:  11/19/22 61.2 kg (135 lb)  11/07/22 61.7 kg (136 lb 0.4 oz)  10/09/22 62 kg (136 lb 9.6 oz)    PHYSICAL EXAM: General:  well appearing.  No respiratory difficulty. Walked into clinic HEENT: normal Neck: supple. JVD flat. Carotids 2+ bilat; no bruits. No lymphadenopathy or thyromegaly appreciated. Cor: PMI nondisplaced. Regular rate & rhythm. No rubs, gallops or murmurs. Lungs: clear Abdomen: soft, nontender, nondistended. No hepatosplenomegaly. No bruits or masses. Good bowel sounds. Extremities: no cyanosis, clubbing, rash, trace BLE edema  Neuro: alert & oriented x 3, cranial nerves grossly intact. moves all 4 extremities w/o difficulty. Affect pleasant.   ASSESSMENT & PLAN: 1. Chronic systolic CHF: NICM, EF 15-20%, felt to be related to doxorubicin therapy. But also with frequent PVC's, holter monitor showed 1% PVC's but patient complained of very frequent PVC's.  - Cath 8/18 mild non-obs CAD. LAD 20%, OM-1 60% RCA 20% - Echo 05/2017 ~30-35%. LV size - Echo 03/09/18 EF 45-50% - CPX 01/2018 Peak VO2: 21.5 (108% predicted peak VO2) VE/VCO2 slope:  48 Peak RER: 0.93 - Echo 10/20 EF 40-45% mild to moderate AS - Echo 8/21 EF 50-55% - Echo  08/21/21: EF 45-50% (I thought EF 40-45%) with LBBB dyssynchrony. Mild-mod AS mean gradient 13 AVA 1.01 cm2  - Echo 4/24 EF 35-40%, LBBB dyssynchrony, at least moderate low flow low gradient AS  - ? Worsening AS vs tachy-mediated from SVT vs LBBB as etiology for decline in LV function.  - Stable NYHA I-II, volume status stable - Continue Spiro 12.5 mg daily. Did not tolerate higher dose. - Continue Losartan 25 daily, will not transition to Entresto with BP intt in 80s.  - Continue carvedilol to 3.125 mg BID (couldn't  tolerate 6.25 bid) -  Continue Jardiance 10mg  daily, no GU symptoms - Continue ivabradine - Can repeat CPX test as needed - Can consider CRT if EF drops or symptoms worsen, will repeat echo in 2-3 months - EF too good for cardiac rehab, declined pulmonary rehab.  - BMET today  2. Palpitations/Frequent PVCs - Holter monitor 03/2017 with PVC burden <1%. - Zio 9/21 PVCs 1.3% (monomorphic) - PVCs suppressed with mexilitene 150 bid.Off amio - No change  3. Mitral Regurgitation - Mild on cMRI 07/07/2017 and echo 3/24 - stable  4. Pulmonary nodule - Stable on f/u CT 11/18. No change.   5. Aortic stenosis - echo 10/20 AoV mean 15 AVA 0.99 (mild to moderate AS) - echo 8/21 Mild to moderate aortic valve stenosis. AVA 1.48 cm. Aortic valve mean gradient measures 18 mmHg. - Echo  08/21/21: EF 45-50% (I thought EF 40-45%) with LBBB dyssynchrony. Mild-mod AS mean gradient 13 AVA 1.01 cm2  -Echo 4/24 EF 35-40%. At least moderate low flow low gradient AS with mean gradient of 18 mmHg, AVA by continuity equation 0.79 and DVI 0.25.  -? Progressive AS etiology for decline in functional capacity and EF - follow for now  6. Hyperlipidemia - now on Crestor - LDL 54 5/24 - followed by PCP  7. Hx of SVT - Terminated with adenosine during this last admission - NSR today   Follow-up 2-3 months with Dr. Gala Romney with echo  Alen Bleacher, NP  10:00 AM  Advanced Heart Failure Clinic [facility] [address] [facility] [place] [address] [phone] (office) [phone] (fax)

## 2022-11-19 ENCOUNTER — Ambulatory Visit (HOSPITAL_COMMUNITY)
Admit: 2022-11-19 | Discharge: 2022-11-19 | Disposition: A | Discharge status: Home / Self Care | Payer: PPO | Attending: Internal Medicine | Admitting: Internal Medicine

## 2022-11-19 ENCOUNTER — Encounter (HOSPITAL_COMMUNITY): Payer: Self-pay

## 2022-11-19 VITALS — BP 110/70 | HR 65 | Wt 135.0 lb

## 2022-11-19 DIAGNOSIS — I340 Nonrheumatic mitral (valve) insufficiency: Secondary | ICD-10-CM

## 2022-11-19 DIAGNOSIS — Z8679 Personal history of other diseases of the circulatory system: Secondary | ICD-10-CM | POA: Insufficient documentation

## 2022-11-19 DIAGNOSIS — E785 Hyperlipidemia, unspecified: Secondary | ICD-10-CM | POA: Insufficient documentation

## 2022-11-19 DIAGNOSIS — I428 Other cardiomyopathies: Secondary | ICD-10-CM | POA: Insufficient documentation

## 2022-11-19 DIAGNOSIS — I080 Rheumatic disorders of both mitral and aortic valves: Secondary | ICD-10-CM | POA: Insufficient documentation

## 2022-11-19 DIAGNOSIS — I5022 Chronic systolic (congestive) heart failure: Secondary | ICD-10-CM

## 2022-11-19 DIAGNOSIS — Z79899 Other long term (current) drug therapy: Secondary | ICD-10-CM | POA: Diagnosis not present

## 2022-11-19 DIAGNOSIS — I2510 Atherosclerotic heart disease of native coronary artery without angina pectoris: Secondary | ICD-10-CM | POA: Insufficient documentation

## 2022-11-19 DIAGNOSIS — C50912 Malignant neoplasm of unspecified site of left female breast: Secondary | ICD-10-CM | POA: Diagnosis not present

## 2022-11-19 DIAGNOSIS — E782 Mixed hyperlipidemia: Secondary | ICD-10-CM | POA: Diagnosis not present

## 2022-11-19 DIAGNOSIS — R911 Solitary pulmonary nodule: Secondary | ICD-10-CM | POA: Diagnosis not present

## 2022-11-19 DIAGNOSIS — I5023 Acute on chronic systolic (congestive) heart failure: Secondary | ICD-10-CM | POA: Diagnosis not present

## 2022-11-19 DIAGNOSIS — R002 Palpitations: Secondary | ICD-10-CM | POA: Diagnosis not present

## 2022-11-19 DIAGNOSIS — I447 Left bundle-branch block, unspecified: Secondary | ICD-10-CM | POA: Diagnosis not present

## 2022-11-19 DIAGNOSIS — I350 Nonrheumatic aortic (valve) stenosis: Secondary | ICD-10-CM | POA: Diagnosis not present

## 2022-11-19 DIAGNOSIS — I471 Supraventricular tachycardia, unspecified: Secondary | ICD-10-CM | POA: Diagnosis not present

## 2022-11-19 DIAGNOSIS — I110 Hypertensive heart disease with heart failure: Secondary | ICD-10-CM | POA: Insufficient documentation

## 2022-11-19 DIAGNOSIS — I493 Ventricular premature depolarization: Secondary | ICD-10-CM | POA: Diagnosis not present

## 2022-11-19 LAB — BASIC METABOLIC PANEL
Anion gap: 7 (ref 5–15)
BUN: 13 mg/dL (ref 8–23)
CO2: 24 mmol/L (ref 22–32)
Calcium: 9.3 mg/dL (ref 8.9–10.3)
Chloride: 106 mmol/L (ref 98–111)
Creatinine, Ser: 0.74 mg/dL (ref 0.44–1.00)
GFR, Estimated: >60 mL/min (ref >60)
Glucose, Bld: 98 mg/dL (ref 70–99)
Potassium: 3.8 mmol/L (ref 3.5–5.1)
Sodium: 137 mmol/L (ref 135–145)

## 2022-11-19 NOTE — Patient Instructions (Addendum)
Thank you for coming in today  If you had labs drawn today, any labs that are abnormal the clinic will call you No news is good news  Medications: No changes  Follow up appointments:  Your physician recommends that you schedule a follow-up appointment in: 2-3 month with echocardiogram With Dr. Gala Collier  Your physician has requested that you have an echocardiogram. Echocardiography is a painless test that uses sound waves to create images of your heart. It provides your doctor with information about the size and shape of your heart and how well your heart's chambers and valves are working. This procedure takes approximately one hour. There are no restrictions for this procedure.      Do the following things EVERYDAY: Weigh yourself in the morning before breakfast. Write it down and keep it in a log. Take your medicines as prescribed Eat low salt foods--Limit salt (sodium) to 2000 mg per day.  Stay as active as you can everyday Limit all fluids for the day to less than 2 liters   At the Advanced [facility] Clinic, you and your health needs are our priority. As part of our continuing mission to provide you with exceptional heart care, we have created designated Provider Care Teams. These Care Teams include your primary Cardiologist (physician) and Advanced Practice Providers (APPs- Physician Assistants and Nurse Practitioners) who all work together to provide you with the care you need, when you need it.   You may see any of the following providers on your designated Care Team at your next follow up: Dr Arvilla Meres Dr Marca Ancona Dr. Marcos Eke, NP Robbie Lis, [place] [facility] [place], [place] Brynda Peon, NP Karle Plumber, PharmD   Please be sure to bring in all your medications bottles to every appointment.    Thank you for choosing [facility]  If you have any questions or concerns before your next  appointment please send [place] a message through [place] or call our office at [phone].    TO LEAVE A MESSAGE FOR THE NURSE SELECT OPTION 2, PLEASE LEAVE A MESSAGE INCLUDING: YOUR NAME DATE OF BIRTH CALL BACK NUMBER REASON FOR CALL**this is important as we prioritize the call backs  YOU WILL RECEIVE A CALL BACK THE SAME DAY AS LONG AS YOU CALL BEFORE 4:00 PM

## 2022-11-30 DIAGNOSIS — L570 Actinic keratosis: Secondary | ICD-10-CM | POA: Diagnosis not present

## 2022-11-30 DIAGNOSIS — L814 Other melanin hyperpigmentation: Secondary | ICD-10-CM | POA: Diagnosis not present

## 2022-11-30 DIAGNOSIS — L853 Xerosis cutis: Secondary | ICD-10-CM | POA: Diagnosis not present

## 2022-11-30 DIAGNOSIS — L281 Prurigo nodularis: Secondary | ICD-10-CM | POA: Diagnosis not present

## 2022-11-30 DIAGNOSIS — L821 Other seborrheic keratosis: Secondary | ICD-10-CM | POA: Diagnosis not present

## 2022-11-30 DIAGNOSIS — D225 Melanocytic nevi of trunk: Secondary | ICD-10-CM | POA: Diagnosis not present

## 2022-11-30 DIAGNOSIS — D2271 Melanocytic nevi of right lower limb, including hip: Secondary | ICD-10-CM | POA: Diagnosis not present

## 2022-11-30 DIAGNOSIS — L820 Inflamed seborrheic keratosis: Secondary | ICD-10-CM | POA: Diagnosis not present

## 2022-11-30 DIAGNOSIS — D485 Neoplasm of uncertain behavior of skin: Secondary | ICD-10-CM | POA: Diagnosis not present

## 2022-12-12 ENCOUNTER — Other Ambulatory Visit: Payer: Self-pay

## 2022-12-12 ENCOUNTER — Other Ambulatory Visit (HOSPITAL_COMMUNITY): Payer: Self-pay | Admitting: Internal Medicine

## 2022-12-12 ENCOUNTER — Other Ambulatory Visit (HOSPITAL_COMMUNITY): Payer: Self-pay

## 2022-12-12 MED ORDER — MEXILETINE HCL 150 MG PO CAPS
ORAL_CAPSULE | Freq: Two times a day (BID) | ORAL | 3 refills | Status: DC
  Filled 2022-12-12: qty 180, 90d supply, fill #0
  Filled 2023-03-17: qty 180, 90d supply, fill #1
  Filled 2023-07-01: qty 180, 90d supply, fill #2
  Filled 2023-09-25: qty 180, 90d supply, fill #3

## 2022-12-12 MED ORDER — EMPAGLIFLOZIN 10 MG PO TABS
10.0000 mg | ORAL_TABLET | Freq: Every day | ORAL | 3 refills | Status: DC
  Filled 2022-12-12: qty 90, 90d supply, fill #0
  Filled 2023-03-17: qty 90, 90d supply, fill #1
  Filled 2023-07-01: qty 90, 90d supply, fill #2
  Filled 2023-09-25: qty 90, 90d supply, fill #3

## 2022-12-13 ENCOUNTER — Other Ambulatory Visit (HOSPITAL_COMMUNITY): Payer: Self-pay

## 2022-12-20 ENCOUNTER — Other Ambulatory Visit (HOSPITAL_COMMUNITY): Payer: Self-pay | Admitting: Internal Medicine

## 2022-12-20 ENCOUNTER — Other Ambulatory Visit (HOSPITAL_COMMUNITY): Payer: Self-pay

## 2022-12-21 ENCOUNTER — Other Ambulatory Visit (HOSPITAL_COMMUNITY): Payer: Self-pay

## 2022-12-21 DIAGNOSIS — I5022 Chronic systolic (congestive) heart failure: Secondary | ICD-10-CM

## 2022-12-21 MED ORDER — LOSARTAN POTASSIUM 25 MG PO TABS
25.0000 mg | ORAL_TABLET | Freq: Every day | ORAL | 3 refills | Status: AC
Start: 2022-12-21 — End: ?
  Filled 2022-12-21: qty 90, 90d supply, fill #0
  Filled 2023-03-17: qty 90, 90d supply, fill #1
  Filled 2023-07-01: qty 90, 90d supply, fill #2
  Filled 2023-09-25: qty 90, 90d supply, fill #3

## 2022-12-23 ENCOUNTER — Other Ambulatory Visit: Payer: Self-pay | Admitting: Family Medicine

## 2022-12-24 ENCOUNTER — Other Ambulatory Visit: Payer: Self-pay

## 2022-12-24 ENCOUNTER — Other Ambulatory Visit (HOSPITAL_COMMUNITY): Payer: Self-pay

## 2022-12-25 ENCOUNTER — Telehealth: Payer: Self-pay | Admitting: Family Medicine

## 2022-12-25 ENCOUNTER — Other Ambulatory Visit: Payer: Self-pay | Admitting: Family Medicine

## 2022-12-25 DIAGNOSIS — Z1231 Encounter for screening mammogram for malignant neoplasm of breast: Secondary | ICD-10-CM

## 2023-01-02 ENCOUNTER — Ambulatory Visit (INDEPENDENT_AMBULATORY_CARE_PROVIDER_SITE_OTHER): Payer: PPO | Admitting: Family Medicine

## 2023-01-02 ENCOUNTER — Encounter: Payer: Self-pay | Admitting: Family Medicine

## 2023-01-02 VITALS — BP 118/80 | HR 83 | Temp 98.5°F | Resp 18 | Ht 63.0 in | Wt 140.2 lb

## 2023-01-02 DIAGNOSIS — Z0000 Encounter for general adult medical examination without abnormal findings: Secondary | ICD-10-CM

## 2023-01-02 DIAGNOSIS — E785 Hyperlipidemia, unspecified: Secondary | ICD-10-CM

## 2023-01-02 NOTE — Assessment & Plan Note (Signed)
Pt's PE WNL w/ exception of known SEM at RUSB.  UTD on mammo, colonoscopy, PNA vaccines.  Check labs.  Anticipatory guidance provided.

## 2023-01-02 NOTE — Progress Notes (Signed)
   Subjective:    Patient ID: Patricia Collier, female    DOB: 01-Aug-1945, 77 y.o.   MRN: [id number]  HPI CPE- UTD on mammo, colonoscopy, PNA  Patient Care Team    Relationship Specialty Notifications Start End  Sheliah Hatch, MD PCP - General Family Medicine  05/28/12    Comment: Margarita Rana, Durward Mallard, MD (Inactive) Consulting Physician Obstetrics and Gynecology  01/03/17   Bensimhon, Bevelyn Buckles, MD Consulting Physician Cardiology  05/27/19   Bettey Costa, MD Consulting Physician Dermatology  05/27/19   Dahlia Byes, [facility] (Inactive) Pharmacist Pharmacist  10/14/19    Comment: phone number [phone]  Vida Rigger, MD Consulting Physician Gastroenterology  12/13/21     Health Maintenance  Topic Date Due   INFLUENZA VACCINE  02/07/2023   Medicare Annual Wellness (AWV)  06/07/2023   MAMMOGRAM  03/19/2024   Colonoscopy  02/08/2032   Pneumonia Vaccine 79+ Years old  Completed   DEXA SCAN  Completed   HPV VACCINES  Aged Out   DTaP/Tdap/Td  Discontinued   COVID-19 Vaccine  Discontinued   Hepatitis C Screening  Discontinued   Zoster Vaccines- Shingrix  Discontinued      Review of Systems Patient reports no vision/ hearing changes, adenopathy,fever,  persistant/recurrent hoarseness , swallowing issues, chest pain, palpitations, edema, persistant/recurrent cough, hemoptysis, dyspnea (rest/exertional/paroxysmal nocturnal), gastrointestinal bleeding (melena, rectal bleeding), abdominal pain, significant heartburn, bowel changes, GU symptoms (dysuria, hematuria, incontinence), Gyn symptoms (abnormal  bleeding, pain),  syncope, focal weakness, memory loss, numbness & tingling, skin/hair/nail changes, abnormal bruising or bleeding, anxiety, or depression.   + 15 lb weight loss- pt has been working hard    Objective:   Physical Exam General Appearance:    Alert, cooperative, no distress, appears stated age  Head:    Normocephalic, without obvious abnormality, atraumatic  Eyes:     PERRL, conjunctiva/corneas clear, EOM's intact both eyes  Ears:    Normal TM's and external ear canals, both ears  Nose:   Nares normal, septum midline, mucosa normal, no drainage    or sinus tenderness  Throat:   Lips, mucosa, and tongue normal; teeth and gums normal  Neck:   Supple, symmetrical, trachea midline, no adenopathy;    Thyroid: no enlargement/tenderness/nodules  Back:     Symmetric, no curvature, ROM normal, no CVA tenderness  Lungs:     Clear to auscultation bilaterally, respirations unlabored  Chest Wall:    No tenderness or deformity   Heart:    Regular rate and rhythm, S1 and S2 normal, II/VI SEM murmur @RUSB   Breast Exam:    Deferred to mammo  Abdomen:     Soft, non-tender, bowel sounds active all four quadrants,    no masses, no organomegaly  Genitalia:    Deferred  Rectal:    Extremities:   Extremities normal, atraumatic, no cyanosis or edema  Pulses:   2+ and symmetric all extremities  Skin:   Skin color, texture, turgor normal, no rashes or lesions  Lymph nodes:   Cervical, supraclavicular, and axillary nodes normal  Neurologic:   CNII-XII intact, normal strength, sensation and reflexes    throughout          Assessment & Plan:

## 2023-01-02 NOTE — Patient Instructions (Signed)
Follow up in 6 months to recheck BP and cholesteorl We'll notify you of your lab results and make any changes if needed Keep up the good work on healthy diet and regular exercise- you look great!!! Call with any questions or concerns Stay Safe!  Stay Healthy! Have a great summer!!!

## 2023-01-03 ENCOUNTER — Telehealth: Payer: Self-pay

## 2023-01-03 LAB — CBC WITH DIFFERENTIAL/PLATELET
Basophils Absolute: 0.1 10*3/uL (ref 0.0–0.1)
Basophils Relative: 1.3 % (ref 0.0–3.0)
Eosinophils Absolute: 0.2 10*3/uL (ref 0.0–0.7)
Eosinophils Relative: 3 % (ref 0.0–5.0)
HCT: 42.7 % (ref 36.0–46.0)
Hemoglobin: 14.1 g/dL (ref 12.0–15.0)
Lymphocytes Relative: 23.7 % (ref 12.0–46.0)
Lymphs Abs: 1.4 10*3/uL (ref 0.7–4.0)
MCHC: 33 g/dL (ref 30.0–36.0)
MCV: 93.7 fl (ref 78.0–100.0)
Monocytes Absolute: 0.6 10*3/uL (ref 0.1–1.0)
Monocytes Relative: 9.7 % (ref 3.0–12.0)
Neutro Abs: 3.7 10*3/uL (ref 1.4–7.7)
Neutrophils Relative %: 62.3 % (ref 43.0–77.0)
Platelets: 247 10*3/uL (ref 150.0–400.0)
RBC: 4.56 Mil/uL (ref 3.87–5.11)
RDW: 14 % (ref 11.5–15.5)
WBC: 6 10*3/uL (ref 4.0–10.5)

## 2023-01-03 LAB — LIPID PANEL
Cholesterol: 161 mg/dL (ref 0–200)
HDL: 62.2 mg/dL (ref >39.00)
LDL Cholesterol: 78 mg/dL (ref 0–99)
NonHDL: 99.22
Total CHOL/HDL Ratio: 3
Triglycerides: 104 mg/dL (ref 0.0–149.0)
VLDL: 20.8 mg/dL (ref 0.0–40.0)

## 2023-01-03 LAB — HEPATIC FUNCTION PANEL
ALT: 11 U/L (ref 0–35)
AST: 20 U/L (ref 0–37)
Albumin: 4.4 g/dL (ref 3.5–5.2)
Alkaline Phosphatase: 104 U/L (ref 39–117)
Bilirubin, Direct: 0.1 mg/dL (ref 0.0–0.3)
Total Bilirubin: 0.6 mg/dL (ref 0.2–1.2)
Total Protein: 7 g/dL (ref 6.0–8.3)

## 2023-01-03 LAB — BASIC METABOLIC PANEL
BUN: 21 mg/dL (ref 6–23)
CO2: 28 mEq/L (ref 19–32)
Calcium: 9.8 mg/dL (ref 8.4–10.5)
Chloride: 105 mEq/L (ref 96–112)
Creatinine, Ser: 0.66 mg/dL (ref 0.40–1.20)
GFR: 86.35 mL/min (ref >60.00)
Glucose, Bld: 86 mg/dL (ref 70–99)
Potassium: 4 mEq/L (ref 3.5–5.1)
Sodium: 139 mEq/L (ref 135–145)

## 2023-01-03 LAB — TSH: TSH: 4.06 u[IU]/mL (ref 0.35–5.50)

## 2023-01-03 NOTE — Telephone Encounter (Signed)
Left results on pt VM  

## 2023-01-03 NOTE — Telephone Encounter (Signed)
-----   Message from Sheliah Hatch, MD sent at 01/03/2023 12:51 PM EDT ----- Labs look great!  No changes at this time

## 2023-01-11 ENCOUNTER — Other Ambulatory Visit: Payer: Self-pay | Admitting: Family Medicine

## 2023-01-14 ENCOUNTER — Other Ambulatory Visit (HOSPITAL_COMMUNITY): Payer: Self-pay

## 2023-01-14 MED ORDER — ROSUVASTATIN CALCIUM 10 MG PO TABS
10.0000 mg | ORAL_TABLET | Freq: Every evening | ORAL | 1 refills | Status: DC
  Filled 2023-01-22: qty 90, 90d supply, fill #0
  Filled 2023-04-23: qty 90, 90d supply, fill #1

## 2023-01-17 DIAGNOSIS — H10811 Pingueculitis, right eye: Secondary | ICD-10-CM | POA: Diagnosis not present

## 2023-01-22 ENCOUNTER — Other Ambulatory Visit: Payer: Self-pay

## 2023-01-22 ENCOUNTER — Other Ambulatory Visit (HOSPITAL_COMMUNITY): Payer: Self-pay

## 2023-01-23 ENCOUNTER — Other Ambulatory Visit (HOSPITAL_COMMUNITY): Payer: Self-pay

## 2023-01-23 ENCOUNTER — Ambulatory Visit (INDEPENDENT_AMBULATORY_CARE_PROVIDER_SITE_OTHER): Payer: PPO | Admitting: *Deleted

## 2023-01-23 DIAGNOSIS — Z0000 Encounter for general adult medical examination without abnormal findings: Secondary | ICD-10-CM

## 2023-01-23 NOTE — Progress Notes (Signed)
Subjective:   Patricia Collier is a 77 y.o. female who presents for Medicare Annual (Subsequent) preventive examination.  Visit Complete: Virtual  I connected with  Rosalia Hammers on 01/23/23 by a audio enabled telemedicine application and verified that I am speaking with the correct person using two identifiers.  Patient Location: Home  Provider Location: Home Office  I discussed the limitations of evaluation and management by telemedicine. The patient expressed understanding and agreed to proceed.    Review of Systems     Cardiac Risk Factors include: advanced age (>67men, >84 women);family history of premature cardiovascular disease     Objective:    Today's Vitals   There is no height or weight on file to calculate BMI.     01/23/2023    8:34 AM 11/06/2022    1:00 PM 11/06/2022    3:45 AM 06/06/2022   11:58 AM 09/29/2021    2:14 PM 06/20/2020    9:51 AM 05/27/2019   12:56 PM  Advanced Directives  Does Patient Have a Medical Advance Directive? Yes Yes No Yes Yes Yes Yes  Type of Geologist, engineering will  Healthcare Power of [employer] Power of [place];Living will Healthcare Power of [place];Living will Living will;Healthcare Power of Attorney  Does patient want to make changes to medical advance directive?  No - Patient declined     No - Patient declined  Copy of Healthcare Power of Attorney in Chart? No - copy requested   No - copy requested  No - copy requested No - copy requested    Current Medications (verified) Outpatient Encounter Medications as of 01/23/2023  Medication Sig   carvedilol (COREG) 3.125 MG tablet Take 1 tablet (3.125 mg total) by mouth 2 (two) times daily.   Coenzyme Q10 (COQ10) 100 MG CAPS Take 1 tablet by mouth daily.   empagliflozin (JARDIANCE) 10 MG TABS tablet Take 1 tablet (10 mg total) by mouth daily before breakfast.   ivabradine (CORLANOR) 5 MG TABS tablet Take 1 tablet (5 mg total) by  mouth 2 (two) times daily with a meal.   losartan (COZAAR) 25 MG tablet Take 1 tablet (25 mg total) by mouth daily.   Magnesium 250 MG TABS Take 250 mg by mouth daily.   mexiletine (MEXITIL) 150 MG capsule TAKE 1 CAPSULE BY MOUTH 2 TIMES DAILY.   rosuvastatin (CRESTOR) 10 MG tablet Take 1 tablet (10 mg total) by mouth at bedtime.   spironolactone (ALDACTONE) 25 MG tablet Take 0.5 tablets (12.5 mg total) by mouth daily.   zinc gluconate 50 MG tablet Take 50 mg by mouth daily.   No facility-administered encounter medications on file as of 01/23/2023.    Allergies (verified) Effexor [venlafaxine] and Hydrocodone-acetaminophen   History: Past Medical History:  Diagnosis Date   Arthritis    ostearthritis -joints, greater left knee.   Breast cancer [facility])    Breast cancer, left breast (HCC) 2009   S/P surgery, radiation, chemotherapy.-no further oncology visits.   CHF (congestive heart failure) (HCC) dx'd 02/05/2017   Hyperlipidemia    Hypertension    Neuromuscular disorder (HCC)    neuropathy due to chemotherapy   Personal history of chemotherapy 2009   Personal history of radiation therapy 2009   Seizures (HCC)    "childbirth related"   Sleep apnea    mild no C-PAP   SVT (supraventricular tachycardia)    x1 episode -3 yrs ago- no issues since.All heart testing negative. No further  cardiac visits needed.   Past Surgical History:  Procedure Laterality Date   ABDOMINAL HYSTERECTOMY  1984   BILATERAL SALPINGOOPHORECTOMY Bilateral 2000s   BREAST BIOPSY Left 2009   BREAST LUMPECTOMY Left 2009   sentinel node dissection   BUNIONECTOMY Bilateral early 2000s   CARDIAC CATHETERIZATION  2015   DIAGNOSTIC LAPAROSCOPY  1990s-early 2000s   multiple for adhesions   DILATION AND CURETTAGE OF UTERUS  2000   w/laparoscopies   HAMMER TOE SURGERY Right 09/2015; 06/22/2016; 12/28/2016   2nd digit   KNEE ARTHROSCOPY Left 09/21/2015   Procedure: LEFT ARTHROSCOPY KNEE WITH LATERAL MENISCUS  DEBRIDEMENT;  Surgeon: Ollen Gross, MD;  Location: [facility];  Service: Orthopedics;  Laterality: Left;   KNEE ARTHROSCOPY Right 10/09/2021   Procedure: Right knee arthroscopy; lateral meniscal debridement;  Surgeon: Ollen Gross, MD;  Location: [facility];  Service: Orthopedics;  Laterality: Right;   KNEE SURGERY  10/09/2021   Right knee   LAPAROSCOPIC CHOLECYSTECTOMY     RHINOPLASTY  1979   RIGHT/LEFT HEART CATH AND CORONARY ANGIOGRAPHY N/A 02/06/2017   Procedure: Right/Left Heart Cath and Coronary Angiography;  Surgeon: Dolores Patty, MD;  Location: [facility];  Service: Cardiovascular;  Laterality: N/A;   RIGHT/LEFT HEART CATH AND CORONARY ANGIOGRAPHY N/A 11/07/2022   Procedure: RIGHT/LEFT HEART CATH AND CORONARY ANGIOGRAPHY;  Surgeon: Iran Ouch, MD;  Location: [facility];  Service: Cardiovascular;  Laterality: N/A;   TONSILLECTOMY AND ADENOIDECTOMY  1955   TRANSANAL RECTOPEXY  1990s   Family History  Problem Relation Age of Onset   Heart disease Mother    Hypertension Father    Breast cancer Sister    Lung cancer Sister    Skin cancer Brother    Social History   Socioeconomic History   Marital status: Married    Spouse name: Not on file   Number of children: 2   Years of education: Not on file   Highest education level: Not on file  Occupational History   Occupation: anesthetist    Employer: [employer]    Comment: retired June 2019  Tobacco Use   Smoking status: Former    Current packs/day: 0.00    Average packs/day: 2.0 packs/day for 10.0 years (20.0 ttl pk-yrs)    Types: Cigarettes    Start date: 07/10/1979    Quit date: 07/09/1989    Years since quitting: 33.5   Smokeless tobacco: Never  Vaping Use   Vaping status: Never Used  Substance and Sexual Activity   Alcohol use: No   Drug use: No   Sexual activity: Yes  Other Topics Concern   Not on file  Social History Narrative   Patient with caregiver stress due to spouses declining health    Some conflicts with children and addictions (coping well)    Social Determinants of Health   Financial Resource Strain: Low Risk  (01/23/2023)   Overall Financial Resource Strain (CARDIA)    Difficulty of Paying Living Expenses: Not hard at all  Food Insecurity: No Food Insecurity (01/23/2023)   Hunger Vital Sign    Worried About Running Out of Food in the Last Year: Never true    Ran Out of Food in the Last Year: Never true  Transportation Needs: No Transportation Needs (01/23/2023)   PRAPARE - Administrator, Civil Service (Medical): No    Lack of Transportation (Non-Medical): No  Physical Activity: Sufficiently Active (01/23/2023)   Exercise Vital Sign    Days  of Exercise per Week: 5 days    Minutes of Exercise per Session: 30 min  Stress: Stress Concern Present (01/23/2023)   [employer] of Occupational Health - Occupational Stress Questionnaire    Feeling of Stress : Rather much  Social Connections: Moderately Integrated (01/23/2023)   Social Connection and Isolation Panel [NHANES]    Frequency of Communication with Friends and Family: More than three times a week    Frequency of Social Gatherings with Friends and Family: Twice a week    Attends Religious Services: Never    Database administrator or Organizations: Yes    Attends Engineer, structural: More than 4 times per year    Marital Status: Married    Tobacco Counseling Counseling given: Not Answered   Clinical Intake:  Pre-visit preparation completed: Yes  Pain : No/denies pain     Diabetes: No  How often do you need to have someone help you when you read instructions, pamphlets, or other written materials from your doctor or pharmacy?: 1 - Never  Interpreter Needed?: No  Information entered by :: Remi Haggard LPN   Activities of Daily Living    01/23/2023    8:35 AM 01/02/2023    2:19 PM  In your present state of health, do you have any difficulty performing the following  activities:  Hearing? 0 0  Vision? 0 0  Difficulty concentrating or making decisions? 0 0  Walking or climbing stairs? 1 0  Dressing or bathing? 0 0  Doing errands, shopping? 0 0  Preparing Food and eating ? N   Using the Toilet? N   In the past six months, have you accidently leaked urine? Y   Do you have problems with loss of bowel control? N   Managing your Medications? N   Managing your Finances? N   Housekeeping or managing your Housekeeping? N     Patient Care Team: Sheliah Hatch, MD as PCP - General (Family Medicine) Freddy Finner, MD (Inactive) as Consulting Physician (Obstetrics and Gynecology) Gala Romney, Bevelyn Buckles, MD as Consulting Physician (Cardiology) Bettey Costa, MD as Consulting Physician (Dermatology) Dahlia Byes, [facility] (Inactive) as Pharmacist (Pharmacist) Vida Rigger, MD as Consulting Physician (Gastroenterology)  Indicate any recent Medical Services you may have received from other than [facility] providers in the past year (date may be approximate).     Assessment:   This is a routine wellness examination for [facility].  Hearing/Vision screen Hearing Screening - Comments:: No trouble hearing Vision Screening - Comments:: Up to date [place]  Dietary issues and exercise activities discussed:     Goals Addressed             This Visit's Progress    Patient Stated       Continue current lifestyle       Depression Screen    01/23/2023    8:38 AM 01/02/2023    2:19 PM 06/06/2022   12:09 PM 12/13/2021    9:59 AM 11/06/2021    1:49 PM 06/13/2021    9:33 AM 06/20/2020    9:53 AM  PHQ 2/9 Scores  PHQ - 2 Score 0 1 1 0 0 1 0  PHQ- 9 Score 4 4 2  0 3 3     Fall Risk    01/23/2023    8:35 AM 01/02/2023    2:19 PM 06/06/2022   11:59 AM 12/13/2021    9:59 AM 11/06/2021    1:49 PM  Fall Risk  Falls in the past year? 0 0 0 0 0  Number falls in past yr: 0 0 0 0 0  Injury with Fall? 0 0 0 0 0  Risk for fall due to :  No Fall Risks   No Fall Risks No Fall Risks  Follow up Education provided;Falls evaluation completed;Falls prevention discussed Falls evaluation completed Falls evaluation completed;Education provided;Falls prevention discussed Falls evaluation completed Falls evaluation completed    MEDICARE RISK AT HOME:  Medicare Risk at Home - 01/23/23 0835     Any stairs in or around the home? Yes    If so, are there any without handrails? No    Home free of loose throw rugs in walkways, pet beds, electrical cords, etc? Yes    Adequate lighting in your home to reduce risk of falls? Yes    Life alert? No    Use of a cane, walker or w/c? No    Grab bars in the bathroom? No    Shower chair or bench in shower? Yes    Elevated toilet seat or a handicapped toilet? No             TIMED UP AND GO:  Was the test performed?  No    Cognitive Function:        01/23/2023    8:39 AM 06/06/2022   12:05 PM  6CIT Screen  What Year? 0 points 0 points  What month? 0 points 0 points  What time? 0 points 0 points  Count back from 20 0 points 0 points  Months in reverse 0 points 0 points  Repeat phrase 0 points 0 points  Total Score 0 points 0 points    Immunizations Immunization History  Administered Date(s) Administered   Fluad Quad(high Dose 65+) 04/22/2019, 04/15/2020, 04/26/2021, 06/06/2022   Influenza Inj Mdck Quad With Preservative 04/23/2018   Influenza Split 05/09/2013   Influenza-Unspecified 03/29/2017   PFIZER(Purple Top)SARS-COV-2 Vaccination 07/28/2019, 08/17/2019, 05/04/2020   Pneumococcal Conjugate-13 01/03/2017   Pneumococcal Polysaccharide-23 06/13/2020    TDAP status: Due, Education has been provided regarding the importance of this vaccine. Advised may receive this vaccine at local pharmacy or Health Dept. Aware to provide a copy of the vaccination record if obtained from local pharmacy or Health Dept. Verbalized acceptance and understanding.  Flu Vaccine status: Up to date  Pneumococcal  vaccine status: Up to date  Covid-19 vaccine status: Information provided on how to obtain vaccines.   Qualifies for Shingles Vaccine? Yes   Zostavax completed No   Shingrix Completed?: No.    Education has been provided regarding the importance of this vaccine. Patient has been advised to call insurance company to determine out of pocket expense if they have not yet received this vaccine. Advised may also receive vaccine at local pharmacy or Health Dept. Verbalized acceptance and understanding.  Screening Tests Health Maintenance  Topic Date Due   INFLUENZA VACCINE  02/07/2023   Medicare Annual Wellness (AWV)  01/23/2024   MAMMOGRAM  03/19/2024   Colonoscopy  02/08/2032   Pneumonia Vaccine 19+ Years old  Completed   DEXA SCAN  Completed   HPV VACCINES  Aged Out   DTaP/Tdap/Td  Discontinued   COVID-19 Vaccine  Discontinued   Hepatitis C Screening  Discontinued   Zoster Vaccines- Shingrix  Discontinued    Health Maintenance  There are no preventive care reminders to display for this patient.  Colorectal cancer screening: Type of screening: Colonoscopy. Completed 2023. Repeat every   years  Mammogram  scheduled  Bone Density   declined  Lung Cancer Screening: (Low Dose CT Chest recommended if Age 10-80 years, 20 pack-year currently smoking OR have quit w/in 15years.) does not qualify.   Lung Cancer Screening Referral:   Additional Screening:  Hepatitis C Screening: does qualify  Vision Screening: Recommended annual ophthalmology exams for early detection of glaucoma and other disorders of the eye. Is the patient up to date with their annual eye exam?  Yes  Who is the provider or what is the name of the office in which the patient attends annual eye exams? [facility] [place] If pt is not established with a provider, would they like to be referred to a provider to establish care? No .   Dental Screening: Recommended annual dental exams for proper oral hygiene    Community  Resource Referral / Chronic Care Management: CRR required this visit?  No   CCM required this visit?  No     Plan:     I have personally reviewed and noted the following in the patient's chart:   Medical and social history Use of alcohol, tobacco or illicit drugs  Current medications and supplements including opioid prescriptions. Patient is not currently taking opioid prescriptions. Functional ability and status Nutritional status Physical activity Advanced directives List of other physicians Hospitalizations, surgeries, and ER visits in previous 12 months Vitals Screenings to include cognitive, depression, and falls Referrals and appointments  In addition, I have reviewed and discussed with patient certain preventive protocols, quality metrics, and best practice recommendations. A written personalized care plan for preventive services as well as general preventive health recommendations were provided to patient.     Remi Haggard, LPN   1/61/0960   After Visit Summary:   Nurse Notes:

## 2023-01-23 NOTE — Patient Instructions (Signed)
Patricia Collier , Thank you for taking time to come for your Medicare Wellness Visit. I appreciate your ongoing commitment to your health goals. Please review the following plan we discussed and let me know if I can assist you in the future.   Screening recommendations/referrals: Colonoscopy: up to date Mammogram: scheduled Bone Density: Education provided Recommended yearly ophthalmology/optometry visit for glaucoma screening and checkup Recommended yearly dental visit for hygiene and checkup  Vaccinations: Influenza vaccine: up to date Pneumococcal vaccine: up to date Tdap vaccine: Education provided Shingles vaccine: 1 of 2    Advanced directives: yes        Preventive Care 65 Years and Older, Female Preventive care refers to lifestyle choices and visits with your health care provider that can promote health and wellness. What does preventive care include? A yearly physical exam. This is also called an annual well check. Dental exams once or twice a year. Routine eye exams. Ask your health care provider how often you should have your eyes checked. Personal lifestyle choices, including: Daily care of your teeth and gums. Regular physical activity. Eating a healthy diet. Avoiding tobacco and drug use. Limiting alcohol use. Practicing safe sex. Taking low-dose aspirin every day. Taking vitamin and mineral supplements as recommended by your health care provider. What happens during an annual well check? The services and screenings done by your health care provider during your annual well check will depend on your age, overall health, lifestyle risk factors, and family history of disease. Counseling  Your health care provider may ask you questions about your: Alcohol use. Tobacco use. Drug use. Emotional well-being. Home and relationship well-being. Sexual activity. Eating habits. History of falls. Memory and ability to understand (cognition). Work and work  Astronomer. Reproductive health. Screening  You may have the following tests or measurements: Height, weight, and BMI. Blood pressure. Lipid and cholesterol levels. These may be checked every 5 years, or more frequently if you are over 10 years old. Skin check. Lung cancer screening. You may have this screening every year starting at age 39 if you have a 30-pack-year history of smoking and currently smoke or have quit within the past 15 years. Fecal occult blood test (FOBT) of the stool. You may have this test every year starting at age 19. Flexible sigmoidoscopy or colonoscopy. You may have a sigmoidoscopy every 5 years or a colonoscopy every 10 years starting at age 70. Hepatitis C blood test. Hepatitis B blood test. Sexually transmitted disease (STD) testing. Diabetes screening. This is done by checking your blood sugar (glucose) after you have not eaten for a while (fasting). You may have this done every 1-3 years. Bone density scan. This is done to screen for osteoporosis. You may have this done starting at age 18. Mammogram. This may be done every 1-2 years. Talk to your health care provider about how often you should have regular mammograms. Talk with your health care provider about your test results, treatment options, and if necessary, the need for more tests. Vaccines  Your health care provider may recommend certain vaccines, such as: Influenza vaccine. This is recommended every year. Tetanus, diphtheria, and acellular pertussis (Tdap, Td) vaccine. You may need a Td booster every 10 years. Zoster vaccine. You may need this after age 11. Pneumococcal 13-valent conjugate (PCV13) vaccine. One dose is recommended after age 66. Pneumococcal polysaccharide (PPSV23) vaccine. One dose is recommended after age 54. Talk to your health care provider about which screenings and vaccines you need and how often  you need them. This information is not intended to replace advice given to you by  your health care provider. Make sure you discuss any questions you have with your health care provider. Document Released: 07/22/2015 Document Revised: 03/14/2016 Document Reviewed: 04/26/2015 Elsevier Interactive Patient Education  2017 [employer].  Fall Prevention in the Home Falls can cause injuries. They can happen to people of all ages. There are many things you can do to make your home safe and to help prevent falls. What can I do on the outside of my home? Regularly fix the edges of walkways and driveways and fix any cracks. Remove anything that might make you trip as you walk through a door, such as a raised step or threshold. Trim any bushes or trees on the path to your home. Use bright outdoor lighting. Clear any walking paths of anything that might make someone trip, such as rocks or tools. Regularly check to see if handrails are loose or broken. Make sure that both sides of any steps have handrails. Any raised decks and porches should have guardrails on the edges. Have any leaves, snow, or ice cleared regularly. Use sand or salt on walking paths during winter. Clean up any spills in your garage right away. This includes oil or grease spills. What can I do in the bathroom? Use night lights. Install grab bars by the toilet and in the tub and shower. Do not use towel bars as grab bars. Use non-skid mats or decals in the tub or shower. If you need to sit down in the shower, use a plastic, non-slip stool. Keep the floor dry. Clean up any water that spills on the floor as soon as it happens. Remove soap buildup in the tub or shower regularly. Attach bath mats securely with double-sided non-slip rug tape. Do not have throw rugs and other things on the floor that can make you trip. What can I do in the bedroom? Use night lights. Make sure that you have a light by your bed that is easy to reach. Do not use any sheets or blankets that are too big for your bed. They should not hang  down onto the floor. Have a firm chair that has side arms. You can use this for support while you get dressed. Do not have throw rugs and other things on the floor that can make you trip. What can I do in the kitchen? Clean up any spills right away. Avoid walking on wet floors. Keep items that you use a lot in easy-to-reach places. If you need to reach something above you, use a strong step stool that has a grab bar. Keep electrical cords out of the way. Do not use floor polish or wax that makes floors slippery. If you must use wax, use non-skid floor wax. Do not have throw rugs and other things on the floor that can make you trip. What can I do with my stairs? Do not leave any items on the stairs. Make sure that there are handrails on both sides of the stairs and use them. Fix handrails that are broken or loose. Make sure that handrails are as long as the stairways. Check any carpeting to make sure that it is firmly attached to the stairs. Fix any carpet that is loose or worn. Avoid having throw rugs at the top or bottom of the stairs. If you do have throw rugs, attach them to the floor with carpet tape. Make sure that you have a light  switch at the top of the stairs and the bottom of the stairs. If you do not have them, ask someone to add them for you. What else can I do to help prevent falls? Wear shoes that: Do not have high heels. Have rubber bottoms. Are comfortable and fit you well. Are closed at the toe. Do not wear sandals. If you use a stepladder: Make sure that it is fully opened. Do not climb a closed stepladder. Make sure that both sides of the stepladder are locked into place. Ask someone to hold it for you, if possible. Clearly mark and make sure that you can see: Any grab bars or handrails. First and last steps. Where the edge of each step is. Use tools that help you move around (mobility aids) if they are needed. These  include: Canes. Walkers. Scooters. Crutches. Turn on the lights when you go into a dark area. Replace any light bulbs as soon as they burn out. Set up your furniture so you have a clear path. Avoid moving your furniture around. If any of your floors are uneven, fix them. If there are any pets around you, be aware of where they are. Review your medicines with your doctor. Some medicines can make you feel dizzy. This can increase your chance of falling. Ask your doctor what other things that you can do to help prevent falls. This information is not intended to replace advice given to you by your health care provider. Make sure you discuss any questions you have with your health care provider. Document Released: 04/21/2009 Document Revised: 12/01/2015 Document Reviewed: 07/30/2014 Elsevier Interactive Patient Education  2017 [employer].

## 2023-01-25 ENCOUNTER — Other Ambulatory Visit (HOSPITAL_COMMUNITY): Payer: Self-pay

## 2023-02-25 ENCOUNTER — Other Ambulatory Visit (HOSPITAL_COMMUNITY): Payer: Self-pay

## 2023-02-26 NOTE — Progress Notes (Signed)
Advanced Heart Failure Clinic Note   Primary Cardiologist: Dr. Gala Romney   HPI: Patricia Collier is a 77 y.o. female retired Scientist, clinical (histocompatibility and immunogenetics) from [employer] with a history of breast cancer s/p left lumpectomy, previous tobacco use, aortic stenosis, PVCs and systolic HF due to NICM  Left breast CA. T1No Grade III invasive ductal carcinoma and received dose-dense doxorubicin in 2009 and cyclophosphamide x4, paclitaxel x12  Admitted 7/18 with new onset acute systolic CHF. EF 15-20%. Right and left heart cath done and showed  moderate non-obstructive CAD Fick output/index 3.1/1.8.  Holter monitor 8/18: Multiform PVCs, couplets, and triplets were noted with periods of bigeminy. PVC burden < 1%.No VT observed.  Echo 9/19: EF 45-50% moderate AS mean gradient Echo 8/21: EF 50-55% mild to moderate AS mean  Zio 11/21: SR 72. 1.3% PVCs. Marland Kitchen   Echo  08/21/21: EF 45-50% (EF 40-45%) with LBBB dyssynchrony. Mild-mod AS mean gradient 13 AVA 1.01 cm2   Admitted 10/2022 with SVT.  Terminated with adenosine.  Underwent echo EF 35 to 40%.  Troponins elevated so underwent left/right heart cath, minimal CAD.  Plan to continue GDMT titration.   Today she returns for post hospital follow up. Overall feeling good. Denies palpitations, CP, dizziness, edema, or PND/Orthopnea. Wakes up a lot during the night to urinate. No SOB. Appetite good, watches what she eats. No fever or chills. Weight at home 134 pounds. Taking all medications. SBP 80s-100s. Walking 1.5-1.75 miles twice a day. Has played golf a couple of times.   EKG NSR 65 bpm (Personally reviewed)     Cardiac studies:  4/24  Echo EF 35-40% and mod AS (Mean gradient 18 mm Hg. AVA by continuity  equation 0.79. DVI 0.25).  4/24 R/LHC which demonstrated minimal CAD (1st Mrg lesion 30% stenosed). RHC ok, RA: 1 mmHg, RV: 19/3 mmHg, PA: 22/10, Cardiac output is 4.24 with a cardiac index of 2.57.  Moderate aortic stenosis with peak to peak gradient of 11  mmHg, mean gradient of 19 mmHg and valve area of 1.1 cm  Zio 11/21 1. Sinus rhythm - avg HR of 72 2. Isolated PACs were rare 3. Occasional PVCs  (1.3%, 17991) 4. No high-grade arrhythmias 5. Patient triggered events associated with isolated PVCs.  CPX 01/2018 Peak VO2: 21.5 (108% predicted peak VO2) VE/VCO2 slope:  48 OUES: 1.84 Peak RER: 0.93 Echo 05/09/17 EF ~30% RV ok. Personally reviewed Echo 02/05/17 LVEF 15-20%, Grade 1 DD, Mild/Mod central MR, Mild LAE R/LHC 02/06/17 There is severe left ventricular systolic dysfunction. Dist Cx lesion, 20 %stenosed. 1st Mrg lesion, 60 %stenosed. Dist RCA lesion, 20 %stenosed. Mid LAD to Dist LAD lesion, 20 %stenosed.  Cardiac MRI 06/2017 1.  Moderate LVE with diffuse hypokinesis EF 35% 2. Small area of apical hyperenhancement on delayed gadolinium images 3.  Mild MR 4.  Mild LAE 5. Thickened tri leaflet AV with restricted leaflet motion suggest echo for AS correlation 6.  Normal RV size and function 7.  Normal aortic root 3.0 cm  Review of systems complete and found to be negative unless listed in HPI.   Past Medical History:  Diagnosis Date   Arthritis    ostearthritis -joints, greater left knee.   Breast cancer [name])    Breast cancer, left breast (HCC) 2009   S/P surgery, radiation, chemotherapy.-no further oncology visits.   CHF (congestive heart failure) (HCC) dx'd 02/05/2017   Hyperlipidemia    Hypertension    Neuromuscular disorder (HCC)    neuropathy due  to chemotherapy   Personal history of chemotherapy 2009   Personal history of radiation therapy 2009   Seizures (HCC)    "childbirth related"   Sleep apnea    mild no C-PAP   SVT (supraventricular tachycardia)    x1 episode -3 yrs ago- no issues since.All heart testing negative. No further cardiac visits needed.   Current Outpatient Medications  Medication Sig Dispense Refill   carvedilol (COREG) 3.125 MG tablet Take 1 tablet (3.125 mg total) by mouth 2 (two) times  daily. 60 tablet 3   Coenzyme Q10 (COQ10) 100 MG CAPS Take 1 tablet by mouth daily.     empagliflozin (JARDIANCE) 10 MG TABS tablet Take 1 tablet (10 mg total) by mouth daily before breakfast. 90 tablet 3   ivabradine (CORLANOR) 5 MG TABS tablet Take 1 tablet (5 mg total) by mouth 2 (two) times daily with a meal. 60 tablet 3   losartan (COZAAR) 25 MG tablet Take 1 tablet (25 mg total) by mouth daily. 90 tablet 3   Magnesium 250 MG TABS Take 250 mg by mouth daily.     mexiletine (MEXITIL) 150 MG capsule TAKE 1 CAPSULE BY MOUTH 2 TIMES DAILY. 180 capsule 3   rosuvastatin (CRESTOR) 10 MG tablet Take 1 tablet (10 mg total) by mouth at bedtime. 90 tablet 1   spironolactone (ALDACTONE) 25 MG tablet Take 0.5 tablets (12.5 mg total) by mouth daily. 45 tablet 3   zinc gluconate 50 MG tablet Take 50 mg by mouth daily.     No current facility-administered medications for this encounter.   Allergies  Allergen Reactions   Effexor [Venlafaxine] Other (See Comments)    Caused depression, negative thoughts, and crying    Hydrocodone-Acetaminophen Itching and Nausea And Vomiting    Patient can tolerate with Zofran    Social History   Socioeconomic History   Marital status: Married    Spouse name: Not on file   Number of children: 2   Years of education: Not on file   Highest education level: Not on file  Occupational History   Occupation: anesthetist    Employer: [employer]    Comment: retired June 2019  Tobacco Use   Smoking status: Former    Current packs/day: 0.00    Average packs/day: 2.0 packs/day for 10.0 years (20.0 ttl pk-yrs)    Types: Cigarettes    Start date: 07/10/1979    Quit date: 07/09/1989    Years since quitting: 33.6   Smokeless tobacco: Never  Vaping Use   Vaping status: Never Used  Substance and Sexual Activity   Alcohol use: No   Drug use: No   Sexual activity: Yes  Other Topics Concern   Not on file  Social History Narrative   Patient with caregiver stress due to  spouses declining health   Some conflicts with children and addictions (coping well)    Social Determinants of Health   Financial Resource Strain: Low Risk  (01/23/2023)   Overall Financial Resource Strain (CARDIA)    Difficulty of Paying Living Expenses: Not hard at all  Food Insecurity: No Food Insecurity (01/23/2023)   Hunger Vital Sign    Worried About Running Out of Food in the Last Year: Never true    Ran Out of Food in the Last Year: Never true  Transportation Needs: No Transportation Needs (01/23/2023)   PRAPARE - Administrator, Civil Service (Medical): No    Lack of Transportation (Non-Medical): No  Physical Activity:  Sufficiently Active (01/23/2023)   Exercise Vital Sign    Days of Exercise per Week: 5 days    Minutes of Exercise per Session: 30 min  Stress: Stress Concern Present (01/23/2023)   [employer] of Occupational Health - Occupational Stress Questionnaire    Feeling of Stress : Rather much  Social Connections: Moderately Integrated (01/23/2023)   Social Connection and Isolation Panel [NHANES]    Frequency of Communication with Friends and Family: More than three times a week    Frequency of Social Gatherings with Friends and Family: Twice a week    Attends Religious Services: Never    Database administrator or Organizations: Yes    Attends Engineer, structural: More than 4 times per year    Marital Status: Married  Catering manager Violence: Not At Risk (01/23/2023)   Humiliation, Afraid, Rape, and Kick questionnaire    Fear of Current or Ex-Partner: No    Emotionally Abused: No    Physically Abused: No    Sexually Abused: No   Family History  Problem Relation Age of Onset   Heart disease Mother    Hypertension Father    Breast cancer Sister    Lung cancer Sister    Skin cancer Brother    There were no vitals filed for this visit.   Wt Readings from Last 3 Encounters:  01/02/23 63.6 kg (140 lb 4 oz)  11/19/22 61.2 kg (135 lb)   11/07/22 61.7 kg (136 lb 0.4 oz)    PHYSICAL EXAM: General:  well appearing.  No respiratory difficulty. Walked into clinic HEENT: normal Neck: supple. JVD flat. Carotids 2+ bilat; no bruits. No lymphadenopathy or thyromegaly appreciated. Cor: PMI nondisplaced. Regular rate & rhythm. No rubs, gallops or murmurs. Lungs: clear Abdomen: soft, nontender, nondistended. No hepatosplenomegaly. No bruits or masses. Good bowel sounds. Extremities: no cyanosis, clubbing, rash, trace BLE edema  Neuro: alert & oriented x 3, cranial nerves grossly intact. moves all 4 extremities w/o difficulty. Affect pleasant.   ASSESSMENT & PLAN: 1. Chronic systolic CHF: NICM, EF 15-20%, felt to be related to doxorubicin therapy. But also with frequent PVC's, holter monitor showed 1% PVC's but patient complained of very frequent PVC's.  - Cath 8/18 mild non-obs CAD. LAD 20%, OM-1 60% RCA 20% - Echo 05/2017 ~30-35%. LV size - Echo 03/09/18 EF 45-50% - CPX 01/2018 Peak VO2: 21.5 (108% predicted peak VO2) VE/VCO2 slope:  48 Peak RER: 0.93 - Echo 10/20 EF 40-45% mild to moderate AS - Echo 8/21 EF 50-55% - Echo  08/21/21: EF 45-50% (I thought EF 40-45%) with LBBB dyssynchrony. Mild-mod AS mean gradient 13 AVA 1.01 cm2  - Echo 4/24 EF 35-40%, LBBB dyssynchrony, at least moderate low flow low gradient AS  - Cath 4/24 minimal CAD - Stable NYHA I-II, volume status stable - Continue Spiro 12.5 mg daily. Did not tolerate higher dose. - Continue Losartan 25 daily, will not transition to Entresto with BP intt in 80s.  - Continue carvedilol to 3.125 mg BID (couldn't tolerate 6.25 bid) - Continue Jardiance 10mg  daily, no GU symptoms - Continue ivabradine - Can consider CRT if EF drops or symptoms worsen, will repeat echo in 2-3 months - BMET today  2. Palpitations/Frequent PVCs - Holter monitor 03/2017 with PVC burden <1%. - Zio 9/21 PVCs 1.3% (monomorphic) - PVCs suppressed with mexilitene 150 bid.Off amio - No change  3.  Mitral Regurgitation - Mild on cMRI 07/07/2017 and echo 3/24 - stable  4. Pulmonary  nodule - Stable on f/u CT 11/18. No change.   5. Aortic stenosis - echo 10/20 AoV mean 15 AVA 0.99 (mild to moderate AS) - echo 8/21 Mild to moderate aortic valve stenosis. AVA 1.48 cm. Aortic valve mean gradient measures 18 mmHg. - Echo  08/21/21: EF 45-50% (I thought EF 40-45%) with LBBB dyssynchrony. Mild-mod AS mean gradient 13 AVA 1.01 cm2  -Echo 4/24 EF 35-40%. At least moderate low flow low gradient AS with mean gradient of 18 mmHg, AVA by continuity equation 0.79 and DVI 0.25.  - follow for now  6. Hyperlipidemia - now on Crestor - LDL 54 5/24 - followed by PCP  7. Hx of SVT 4/24 - Terminated with adenosine during this last admission - NSR today    Arvilla Meres, MD  11:01 PM  Advanced Heart Failure Clinic [facility] [address] [facility] [place] [address] [phone] (office) [phone] (fax)

## 2023-02-27 ENCOUNTER — Encounter (HOSPITAL_COMMUNITY): Payer: Self-pay | Admitting: Internal Medicine

## 2023-02-27 ENCOUNTER — Other Ambulatory Visit (HOSPITAL_COMMUNITY): Payer: Self-pay

## 2023-02-27 ENCOUNTER — Ambulatory Visit (HOSPITAL_BASED_OUTPATIENT_CLINIC_OR_DEPARTMENT_OTHER)
Admission: RE | Admit: 2023-02-27 | Discharge: 2023-02-27 | Disposition: A | Discharge status: Home / Self Care | Payer: PPO | Source: Ambulatory Visit | Attending: Internal Medicine | Admitting: Internal Medicine

## 2023-02-27 ENCOUNTER — Ambulatory Visit (HOSPITAL_COMMUNITY)
Admission: RE | Admit: 2023-02-27 | Discharge: 2023-02-27 | Disposition: A | Discharge status: Home / Self Care | Payer: PPO | Source: Ambulatory Visit | Attending: Internal Medicine | Admitting: Internal Medicine

## 2023-02-27 VITALS — BP 110/74 | HR 62 | Wt 139.8 lb

## 2023-02-27 DIAGNOSIS — I493 Ventricular premature depolarization: Secondary | ICD-10-CM | POA: Diagnosis not present

## 2023-02-27 DIAGNOSIS — Z853 Personal history of malignant neoplasm of breast: Secondary | ICD-10-CM | POA: Insufficient documentation

## 2023-02-27 DIAGNOSIS — I5022 Chronic systolic (congestive) heart failure: Secondary | ICD-10-CM

## 2023-02-27 DIAGNOSIS — Z79899 Other long term (current) drug therapy: Secondary | ICD-10-CM | POA: Diagnosis not present

## 2023-02-27 DIAGNOSIS — R9431 Abnormal electrocardiogram [ECG] [EKG]: Secondary | ICD-10-CM | POA: Diagnosis not present

## 2023-02-27 DIAGNOSIS — I350 Nonrheumatic aortic (valve) stenosis: Secondary | ICD-10-CM

## 2023-02-27 DIAGNOSIS — E785 Hyperlipidemia, unspecified: Secondary | ICD-10-CM | POA: Insufficient documentation

## 2023-02-27 DIAGNOSIS — I447 Left bundle-branch block, unspecified: Secondary | ICD-10-CM

## 2023-02-27 DIAGNOSIS — R911 Solitary pulmonary nodule: Secondary | ICD-10-CM | POA: Diagnosis not present

## 2023-02-27 DIAGNOSIS — I428 Other cardiomyopathies: Secondary | ICD-10-CM | POA: Insufficient documentation

## 2023-02-27 DIAGNOSIS — Z87891 Personal history of nicotine dependence: Secondary | ICD-10-CM | POA: Diagnosis not present

## 2023-02-27 DIAGNOSIS — I471 Supraventricular tachycardia, unspecified: Secondary | ICD-10-CM | POA: Diagnosis not present

## 2023-02-27 LAB — ECHOCARDIOGRAM COMPLETE
AR max vel: 1.15 cm2
AV Area VTI: 1.09 cm2
AV Area mean vel: 1.13 cm2
AV Mean grad: 13.5 mmHg
AV Peak grad: 23.9 mmHg
AV Vena cont: 0.3 cm
Ao pk vel: 2.44 m/s
Area-P 1/2: 3.89 cm2
Calc EF: 31.1 %
MV M vel: 3.42 m/s
MV Peak grad: 46.9 mmHg
P 1/2 time: 826 msec
S' Lateral: 3.9 cm
Single Plane A2C EF: 34.3 %
Single Plane A4C EF: 31.1 %

## 2023-02-27 NOTE — Patient Instructions (Signed)
Medication Changes:  No Changes In Medications at this time.   Lab Work:  Labs done today, your results will be available in MyChart, we will contact you for abnormal readings.  Testing/Procedures:  Your physician has requested that you have a cardiac MRI. Cardiac MRI uses a computer to create images of your heart as its beating, producing both still and moving pictures of your heart and major blood vessels. For further information please visit [email or web address]. Please follow the instruction sheet given to you today for more information.  [facility] [address] [place], [place] [address] [phone] Please take advantage of the free valet parking available at the [facility] and [employer] (Entrance C).  Proceed to the [facility] Radiology Department (First Floor) for check-in.    Magnetic resonance imaging (MRI) is a painless test that produces images of the inside of the body without using Xrays.  During an MRI, strong magnets and radio waves work together in a Data processing manager to form detailed images.   MRI images may provide more details about a medical condition than X-rays, CT scans, and ultrasounds can provide.  You may be given earphones to listen for instructions.  You may eat a light breakfast and take medications as ordered with the exception of furosemide, hydrochlorothiazide, or spironolactone(fluid pill, other). Please avoid stimulants for 12 hr prior to test. (Ie. Caffeine, nicotine, chocolate, or antihistamine medications)  An IV will be inserted into one of your veins. Contrast material will be injected into your IV. It will leave your body through your urine within a day. You may be told to drink plenty of fluids to help flush the contrast material out of your system.  You will be asked to remove all metal, including: Watch, jewelry, and other metal objects including hearing aids, hair pieces and dentures. Also wearable glucose monitoring  systems (ie. Freestyle Libre and Omnipods) (Braces and fillings normally are not a problem.)  TEST WILL TAKE APPROXIMATELY 1 HOUR  PLEASE NOTIFY SCHEDULING AT LEAST 24 HOURS IN ADVANCE IF YOU ARE UNABLE TO KEEP YOUR APPOINTMENT. [phone]  For more information and frequently asked questions, please visit our website : [email or web address]  Please call the Cardiac Imaging Nurse Navigators with any questions/concerns. [phone] Office   Follow-Up in: 4 months as scheduled with Dr. Gala Romney   At the Advanced Heart Failure Clinic, you and your health needs are our priority. We have a designated team specialized in the treatment of Heart Failure. This Care Team includes your primary Heart Failure Specialized Cardiologist (physician), Advanced Practice Providers (APPs- Physician Assistants and Nurse Practitioners), and Pharmacist who all work together to provide you with the care you need, when you need it.   You may see any of the following providers on your designated Care Team at your next follow up:  Dr. Arvilla Meres Dr. Marca Ancona Dr. Marcos Eke, NP Robbie Lis, [place] [facility] [place], [place] Brynda Peon, NP Karle Plumber, PharmD   Please be sure to bring in all your medications bottles to every appointment.   Need to Contact us:  If you have any questions or concerns before your next appointment please send [place] a message through [place] or call our office at [phone].    TO LEAVE A MESSAGE FOR THE NURSE SELECT OPTION 2, PLEASE LEAVE A MESSAGE INCLUDING: YOUR NAME DATE OF BIRTH CALL BACK NUMBER REASON FOR CALL**this is important as we prioritize the call backs  YOU WILL RECEIVE A CALL BACK  THE SAME DAY AS LONG AS YOU CALL BEFORE 4:00 PM

## 2023-03-15 ENCOUNTER — Other Ambulatory Visit (HOSPITAL_COMMUNITY): Payer: Self-pay

## 2023-03-15 ENCOUNTER — Encounter (INDEPENDENT_AMBULATORY_CARE_PROVIDER_SITE_OTHER): Payer: PPO | Admitting: Family Medicine

## 2023-03-15 DIAGNOSIS — U071 COVID-19: Secondary | ICD-10-CM

## 2023-03-15 MED ORDER — NIRMATRELVIR/RITONAVIR (PAXLOVID)TABLET
3.0000 | ORAL_TABLET | Freq: Two times a day (BID) | ORAL | 0 refills | Status: AC
  Filled 2023-03-15: qty 30, 5d supply, fill #0

## 2023-03-15 NOTE — Telephone Encounter (Signed)
[facility] VISIT   Patient agreed to [facility] visit and is aware that copayment and coinsurance may apply. Patient was treated using telemedicine according to accepted telemedicine protocols.  Subjective:   Patient complains of COVID  Patient Active Problem List   Diagnosis Date Noted   Nonrheumatic aortic valve stenosis 11/07/2022   NSTEMI (non-ST elevated myocardial infarction) (HCC) 11/06/2022   Overweight (BMI 25.0-29.9) 06/13/2021   Palpitations 05/09/2017   Mitral regurgitation 05/09/2017   Cardiomegaly 02/04/2017   Pulmonary nodule 02/04/2017   CHF (congestive heart failure) (HCC) 02/04/2017   Physical exam 01/03/2017   Abnormal EKG 05/28/2012   History of left breast cancer 11/19/2011   Hyperlipidemia 01/23/2007   Social History   Tobacco Use   Smoking status: Former    Current packs/day: 0.00    Average packs/day: 2.0 packs/day for 10.0 years (20.0 ttl pk-yrs)    Types: Cigarettes    Start date: 07/10/1979    Quit date: 07/09/1989    Years since quitting: 33.7   Smokeless tobacco: Never  Substance Use Topics   Alcohol use: No    Current Outpatient Medications:    nirmatrelvir/ritonavir (PAXLOVID) 20 x 150 MG & 10 x 100MG  TABS, Take 3 tablets by mouth 2 (two) times daily for 5 days. (Take nirmatrelvir 150 mg two tablets twice daily for 5 days and ritonavir 100 mg one tablet twice daily for 5 days) Patient GFR is 86, Disp: 30 tablet, Rfl: 0   carvedilol (COREG) 3.125 MG tablet, Take 1 tablet (3.125 mg total) by mouth 2 (two) times daily., Disp: 60 tablet, Rfl: 3   Coenzyme Q10 (COQ10) 100 MG CAPS, Take 1 tablet by mouth daily., Disp: , Rfl:    empagliflozin (JARDIANCE) 10 MG TABS tablet, Take 1 tablet (10 mg total) by mouth daily before breakfast., Disp: 90 tablet, Rfl: 3   ivabradine (CORLANOR) 5 MG TABS tablet, Take 1 tablet (5 mg total) by mouth 2 (two) times daily with a meal., Disp: 60 tablet, Rfl: 3   losartan (COZAAR) 25 MG tablet, Take 1 tablet (25 mg total) by  mouth daily., Disp: 90 tablet, Rfl: 3   Magnesium 250 MG TABS, Take 250 mg by mouth daily., Disp: , Rfl:    mexiletine (MEXITIL) 150 MG capsule, TAKE 1 CAPSULE BY MOUTH 2 TIMES DAILY., Disp: 180 capsule, Rfl: 3   rosuvastatin (CRESTOR) 10 MG tablet, Take 1 tablet (10 mg total) by mouth at bedtime., Disp: 90 tablet, Rfl: 1   spironolactone (ALDACTONE) 25 MG tablet, Take 0.5 tablets (12.5 mg total) by mouth daily., Disp: 45 tablet, Rfl: 3   zinc gluconate 50 MG tablet, Take 50 mg by mouth daily., Disp: , Rfl:   Allergies  Allergen Reactions   Effexor [Venlafaxine] Other (See Comments)    Caused depression, negative thoughts, and crying    Hydrocodone-Acetaminophen Itching and Nausea And Vomiting    Patient can tolerate with Zofran    Assessment and Plan:   Diagnosis: COVID. Please see myChart communication and orders below.   No orders of the defined types were placed in this encounter.  Meds ordered this encounter  Medications   nirmatrelvir/ritonavir (PAXLOVID) 20 x 150 MG & 10 x 100MG  TABS    Sig: Take 3 tablets by mouth 2 (two) times daily for 5 days. (Take nirmatrelvir 150 mg two tablets twice daily for 5 days and ritonavir 100 mg one tablet twice daily for 5 days) Patient GFR is 86    Dispense:  30 tablet  Refill:  0    Neena Rhymes, MD 03/15/2023  A total of 12 minutes were spent by me to personally review the patient-generated inquiry, review patient records and data pertinent to assessment of the patient's problem, develop a management plan including generation of prescriptions and/or orders, and on subsequent communication with the patient through secure the MyChart portal service.   There is no separately reported E/M service related to this service in the past 7 days nor does the patient have an upcoming soonest available appointment for this issue. This work was completed in less than 7 days.   The patient consented to this service today (see patient agreement prior  to ongoing communication). Patient counseled regarding the need for in-person exam for certain conditions and was advised to call the office if any changing or worsening symptoms occur.   The codes to be used for the E/M service are: []   99421 for 5-10 minutes of time spent on the inquiry. [x]   [id number] for 11-20 minutes. []   [id number] for 21+ minutes.

## 2023-03-16 ENCOUNTER — Other Ambulatory Visit (HOSPITAL_COMMUNITY): Payer: Self-pay

## 2023-03-17 ENCOUNTER — Other Ambulatory Visit (HOSPITAL_COMMUNITY): Payer: Self-pay | Admitting: Internal Medicine

## 2023-03-18 ENCOUNTER — Other Ambulatory Visit: Payer: Self-pay

## 2023-03-18 ENCOUNTER — Other Ambulatory Visit (HOSPITAL_COMMUNITY): Payer: Self-pay

## 2023-03-18 MED ORDER — SPIRONOLACTONE 25 MG PO TABS
12.5000 mg | ORAL_TABLET | Freq: Every day | ORAL | 3 refills | Status: DC
  Filled 2023-03-18: qty 45, 90d supply, fill #0
  Filled 2023-07-01: qty 45, 90d supply, fill #1
  Filled 2023-09-25: qty 45, 90d supply, fill #2

## 2023-03-18 MED ORDER — IVABRADINE HCL 5 MG PO TABS
5.0000 mg | ORAL_TABLET | Freq: Two times a day (BID) | ORAL | 3 refills | Status: DC
  Filled 2023-03-18 – 2023-04-01 (×2): qty 60, 30d supply, fill #0
  Filled 2023-04-28: qty 60, 30d supply, fill #1
  Filled 2023-05-26: qty 60, 30d supply, fill #2
  Filled 2023-06-26: qty 60, 30d supply, fill #3

## 2023-03-20 ENCOUNTER — Other Ambulatory Visit (HOSPITAL_COMMUNITY): Payer: Self-pay

## 2023-03-26 ENCOUNTER — Ambulatory Visit
Admission: RE | Admit: 2023-03-26 | Discharge: 2023-03-26 | Disposition: A | Discharge status: Home / Self Care | Payer: PPO | Source: Ambulatory Visit | Attending: Family Medicine | Admitting: Family Medicine

## 2023-03-26 DIAGNOSIS — Z1231 Encounter for screening mammogram for malignant neoplasm of breast: Secondary | ICD-10-CM | POA: Diagnosis not present

## 2023-03-29 ENCOUNTER — Other Ambulatory Visit (HOSPITAL_COMMUNITY): Payer: Self-pay

## 2023-03-29 ENCOUNTER — Encounter: Payer: Self-pay | Admitting: Pharmacist

## 2023-03-29 NOTE — Progress Notes (Signed)
Pharmacy Quality Measure Review  This patient is appearing on a report for being at risk of failing the adherence measure for hypertension (ACEi/ARB) medications this calendar year.   Medication: losartan 25mg  Last fill date: 09/19/2022 for 30 day supply per HTA adhrence report  Checked Dr 1st database and was not able to pull up patient or any prescriptions.  Epic shows RF for losartan  07/24/22, 2/16 and 3/14 for 30 DS 12/24/2022 an 03/20/2023 for 90 DS It looks like she missed about 65 days between 4/13 and 6/17 refills. But per chart her dose of losartan was changed from 25mg  twice a day to 25mg  once a day during this time so she likely had extra tablets.   Called [facility] pharmacy to verified patient filled on 6/17 and 9/11 - they were filled and picked up. Also verified was filled using her HTA plan.    Insurance report was not up to date. No action needed at this time.   Henrene Pastor, PharmD Henrene Pastor, PharmD Clinical Pharmacist [facility]  Population Health [phone]

## 2023-04-01 ENCOUNTER — Other Ambulatory Visit: Payer: Self-pay

## 2023-04-01 ENCOUNTER — Other Ambulatory Visit (HOSPITAL_COMMUNITY): Payer: Self-pay

## 2023-04-01 ENCOUNTER — Telehealth (HOSPITAL_COMMUNITY): Payer: Self-pay | Admitting: Pharmacy Technician

## 2023-04-01 NOTE — Telephone Encounter (Signed)
Advanced Heart Failure Patient Advocate Encounter  Received message that [facility] needed PA. Upon submission, insurance states PA is already approved. Looks like pharmacy was trying to fill generic. Current 30 day co-pay, $142. Called and spoke with the patient. She stated the co-pays she has right now are affordable. I asked her to call back if that changes.   Archer Asa, CPhT

## 2023-04-03 ENCOUNTER — Other Ambulatory Visit (HOSPITAL_COMMUNITY): Payer: Self-pay

## 2023-04-12 ENCOUNTER — Other Ambulatory Visit (HOSPITAL_COMMUNITY): Payer: Self-pay | Admitting: Internal Medicine

## 2023-04-15 ENCOUNTER — Other Ambulatory Visit (HOSPITAL_COMMUNITY): Payer: Self-pay

## 2023-04-15 MED ORDER — CARVEDILOL 3.125 MG PO TABS
3.1250 mg | ORAL_TABLET | Freq: Two times a day (BID) | ORAL | 3 refills | Status: DC
  Filled 2023-04-15: qty 60, 30d supply, fill #0
  Filled 2023-05-14: qty 60, 30d supply, fill #1
  Filled 2023-07-01: qty 60, 30d supply, fill #2
  Filled 2023-08-21: qty 60, 30d supply, fill #3

## 2023-04-17 ENCOUNTER — Other Ambulatory Visit (HOSPITAL_COMMUNITY): Payer: Self-pay

## 2023-04-25 ENCOUNTER — Ambulatory Visit (HOSPITAL_COMMUNITY)
Admission: RE | Admit: 2023-04-25 | Discharge: 2023-04-25 | Disposition: A | Discharge status: Home / Self Care | Payer: PPO | Source: Ambulatory Visit | Attending: Cardiology | Admitting: Cardiology

## 2023-04-25 ENCOUNTER — Other Ambulatory Visit (HOSPITAL_COMMUNITY): Payer: Self-pay

## 2023-04-25 DIAGNOSIS — I5022 Chronic systolic (congestive) heart failure: Secondary | ICD-10-CM | POA: Insufficient documentation

## 2023-04-25 LAB — HEMOGLOBIN AND HEMATOCRIT, BLOOD
HCT: 41.4 % (ref 36.0–46.0)
Hemoglobin: 13.8 g/dL (ref 12.0–15.0)

## 2023-04-29 ENCOUNTER — Other Ambulatory Visit: Payer: Self-pay

## 2023-04-30 ENCOUNTER — Other Ambulatory Visit (HOSPITAL_COMMUNITY): Payer: Self-pay

## 2023-05-01 ENCOUNTER — Ambulatory Visit (INDEPENDENT_AMBULATORY_CARE_PROVIDER_SITE_OTHER): Payer: PPO

## 2023-05-01 DIAGNOSIS — Z23 Encounter for immunization: Secondary | ICD-10-CM

## 2023-05-01 NOTE — Progress Notes (Signed)
Patient presented today for the influenza vaccine for season 2024-2025 no concerns expressed and not history of bad reaction.  [employer]

## 2023-05-14 ENCOUNTER — Telehealth (HOSPITAL_COMMUNITY): Payer: Self-pay | Admitting: *Deleted

## 2023-05-14 ENCOUNTER — Other Ambulatory Visit (HOSPITAL_COMMUNITY): Payer: Self-pay

## 2023-05-14 NOTE — Telephone Encounter (Signed)

## 2023-05-15 ENCOUNTER — Other Ambulatory Visit (HOSPITAL_COMMUNITY): Payer: Self-pay

## 2023-05-15 ENCOUNTER — Telehealth (HOSPITAL_COMMUNITY): Payer: Self-pay | Admitting: Cardiology

## 2023-05-15 ENCOUNTER — Other Ambulatory Visit (HOSPITAL_COMMUNITY): Payer: Self-pay | Admitting: Internal Medicine

## 2023-05-15 ENCOUNTER — Ambulatory Visit (HOSPITAL_COMMUNITY)
Admission: RE | Admit: 2023-05-15 | Discharge: 2023-05-15 | Disposition: A | Discharge status: Home / Self Care | Payer: PPO | Source: Ambulatory Visit | Attending: Internal Medicine | Admitting: Internal Medicine

## 2023-05-15 DIAGNOSIS — I5022 Chronic systolic (congestive) heart failure: Secondary | ICD-10-CM

## 2023-05-15 MED ORDER — FLUCONAZOLE 150 MG PO TABS
150.0000 mg | ORAL_TABLET | Freq: Every day | ORAL | 0 refills | Status: DC
  Filled 2023-05-15: qty 1, 1d supply, fill #0

## 2023-05-15 MED ORDER — GADOBUTROL 1 MMOL/ML IV SOLN
9.0000 mL | Freq: Once | INTRAVENOUS | Status: AC | PRN
  Administered 2023-05-15: 9 mL via INTRAVENOUS

## 2023-05-15 NOTE — Telephone Encounter (Signed)
Pt reports this the first instance  Will restart jardiance  after current east infection clears   Aware to call if yeast infection returns

## 2023-05-15 NOTE — Telephone Encounter (Signed)
Patient called to report yeast infection despite being on jardiance x months. Reports no relief with oTC meds, was advised to contact prescribing provider per pharmacy   Requests RX for diflucan Should pt continue med

## 2023-05-17 ENCOUNTER — Other Ambulatory Visit (HOSPITAL_COMMUNITY): Payer: Self-pay

## 2023-05-23 DIAGNOSIS — H353131 Nonexudative age-related macular degeneration, bilateral, early dry stage: Secondary | ICD-10-CM | POA: Diagnosis not present

## 2023-05-27 ENCOUNTER — Other Ambulatory Visit (HOSPITAL_COMMUNITY): Payer: Self-pay

## 2023-05-29 ENCOUNTER — Other Ambulatory Visit (HOSPITAL_COMMUNITY): Payer: Self-pay

## 2023-06-23 ENCOUNTER — Other Ambulatory Visit (HOSPITAL_COMMUNITY): Payer: Self-pay

## 2023-06-23 NOTE — Progress Notes (Signed)
This patient is appearing on a report for being at risk of failing the adherence measure for hypertension (ACEi/ARB) medications this calendar year.   Medication: losartan 25 mg PO daily Last fill date: 03/20/23 for 90 day supply  Patient is due for refills of maintenance meds - refills are on file. Noted that patient has appt with Dr. Gala Romney tomorrow for CHF f/u. Will send patient MyChart message offering to set up refills of medications at [facility] pharmacy pending any med changes that are made tomorrow.    Nils Pyle, PharmD PGY1 Pharmacy Resident

## 2023-06-24 ENCOUNTER — Ambulatory Visit (HOSPITAL_COMMUNITY)
Admission: RE | Admit: 2023-06-24 | Discharge: 2023-06-24 | Disposition: A | Discharge status: Home / Self Care | Payer: PPO | Source: Ambulatory Visit | Attending: Internal Medicine | Admitting: Internal Medicine

## 2023-06-24 ENCOUNTER — Encounter (HOSPITAL_COMMUNITY): Payer: Self-pay | Admitting: Internal Medicine

## 2023-06-24 VITALS — BP 132/86 | HR 81 | Ht 63.0 in | Wt 142.2 lb

## 2023-06-24 DIAGNOSIS — I428 Other cardiomyopathies: Secondary | ICD-10-CM | POA: Diagnosis not present

## 2023-06-24 DIAGNOSIS — Z8679 Personal history of other diseases of the circulatory system: Secondary | ICD-10-CM | POA: Diagnosis not present

## 2023-06-24 DIAGNOSIS — R002 Palpitations: Secondary | ICD-10-CM | POA: Diagnosis not present

## 2023-06-24 DIAGNOSIS — R911 Solitary pulmonary nodule: Secondary | ICD-10-CM | POA: Insufficient documentation

## 2023-06-24 DIAGNOSIS — E162 Hypoglycemia, unspecified: Secondary | ICD-10-CM | POA: Diagnosis not present

## 2023-06-24 DIAGNOSIS — I2510 Atherosclerotic heart disease of native coronary artery without angina pectoris: Secondary | ICD-10-CM | POA: Diagnosis not present

## 2023-06-24 DIAGNOSIS — I447 Left bundle-branch block, unspecified: Secondary | ICD-10-CM

## 2023-06-24 DIAGNOSIS — Z7984 Long term (current) use of oral hypoglycemic drugs: Secondary | ICD-10-CM | POA: Insufficient documentation

## 2023-06-24 DIAGNOSIS — E785 Hyperlipidemia, unspecified: Secondary | ICD-10-CM | POA: Insufficient documentation

## 2023-06-24 DIAGNOSIS — Z79899 Other long term (current) drug therapy: Secondary | ICD-10-CM | POA: Insufficient documentation

## 2023-06-24 DIAGNOSIS — I350 Nonrheumatic aortic (valve) stenosis: Secondary | ICD-10-CM | POA: Diagnosis not present

## 2023-06-24 DIAGNOSIS — I5022 Chronic systolic (congestive) heart failure: Secondary | ICD-10-CM | POA: Diagnosis not present

## 2023-06-24 DIAGNOSIS — I110 Hypertensive heart disease with heart failure: Secondary | ICD-10-CM | POA: Insufficient documentation

## 2023-06-24 DIAGNOSIS — I493 Ventricular premature depolarization: Secondary | ICD-10-CM | POA: Diagnosis not present

## 2023-06-24 DIAGNOSIS — Z87891 Personal history of nicotine dependence: Secondary | ICD-10-CM | POA: Insufficient documentation

## 2023-06-24 NOTE — Addendum Note (Signed)
Encounter addended by: Noralee Space, RN on: 06/24/2023 11:06 AM  Actions taken: Order list changed, Diagnosis association updated, Clinical Note Signed

## 2023-06-24 NOTE — Patient Instructions (Signed)
Great to see you today!!!  You have been referred to EP to discuss getting a CRT-D device, they will call you to schedule  Your physician recommends that you schedule a follow-up appointment in: 6 months with an echocardiogram  If you have any questions or concerns before your next appointment please send [place] a message through [place] or call our office at [phone].    TO LEAVE A MESSAGE FOR THE NURSE SELECT OPTION 2, PLEASE LEAVE A MESSAGE INCLUDING: YOUR NAME DATE OF BIRTH CALL BACK NUMBER REASON FOR CALL**this is important as we prioritize the call backs  YOU WILL RECEIVE A CALL BACK THE SAME DAY AS LONG AS YOU CALL BEFORE 4:00 PM  At the Advanced Heart Failure Clinic, you and your health needs are our priority. As part of our continuing mission to provide you with exceptional heart care, we have created designated Provider Care Teams. These Care Teams include your primary Cardiologist (physician) and Advanced Practice Providers (APPs- Physician Assistants and Nurse Practitioners) who all work together to provide you with the care you need, when you need it.   You may see any of the following providers on your designated Care Team at your next follow up: Dr Arvilla Meres Dr Marca Ancona Dr. Dorthula Nettles Dr. Clearnce Hasten Amy Filbert Schilder, NP Robbie Lis, [place] [facility] [place], [place] Brynda Peon, NP [place] Lee, NP Karle Plumber, PharmD   Please be sure to bring in all your medications bottles to every appointment.    Thank you for choosing [facility]

## 2023-06-24 NOTE — Progress Notes (Signed)
Advanced Heart Failure Clinic Note   Primary Cardiologist: Dr. Gala Romney   HPI: Patricia Collier is a 77 y.o. female retired Scientist, clinical (histocompatibility and immunogenetics) from [employer] with a history of breast cancer s/p left lumpectomy, previous tobacco use, aortic stenosis, PVCs and systolic HF due to NICM  Left breast CA. T1No Grade III invasive ductal carcinoma and received dose-dense doxorubicin in 2009 and cyclophosphamide x4, paclitaxel x12  Admitted 7/18 with new onset acute systolic CHF. EF 15-20%. Right and left heart cath done and showed  moderate non-obstructive CAD Fick output/index 3.1/1.8.  Holter monitor 8/18: Multiform PVCs, couplets, and triplets were noted with periods of bigeminy. PVC burden < 1%.No VT observed.  Echo 9/19: EF 45-50% moderate AS mean gradient Echo 8/21: EF 50-55% mild to moderate AS mean  Zio 11/21: SR 72. 1.3% PVCs. Marland Kitchen   Echo  08/21/21: EF 45-50% (EF 40-45%) with LBBB dyssynchrony. Mild-mod AS mean gradient 13 AVA 1.01 cm2   Admitted 10/2022 with SVT.  Terminated with adenosine.  Underwent echo EF 35 to 40%.  Troponins elevated so underwent left/right heart cath, minimal CAD.  Plan to continue GDMT titration.   Today she returns for f/u. Feels fair. Still gets SOB with mild activity. No edema, orthopnea or PND. Gets occasional hypoglycemia which she takes glucose for.    Echo 02/27/23  EF 35-40% (I thought 30-35%) with regional WMA and moderate AS  cMRI 11/24:  LVER 31% Septal-lateral dyssynchrony consistent with LBBB. No LGE. Normal RV size and systolic function, EF 50%.     Cardiac studies:  4/24  Echo EF 35-40% and mod AS (Mean gradient 18 mm Hg. AVA by continuity  equation 0.79. DVI 0.25).  4/24 R/LHC which demonstrated minimal CAD (1st Mrg lesion 30% stenosed). RHC ok, RA: 1 mmHg, RV: 19/3 mmHg, PA: 22/10, Cardiac output is 4.24 with a cardiac index of 2.57.  Moderate aortic stenosis with peak to peak gradient of 11 mmHg, mean gradient of 19 mmHg and valve  area of 1.1 cm  Zio 11/21 1. Sinus rhythm - avg HR of 72 2. Isolated PACs were rare 3. Occasional PVCs  (1.3%, 17991) 4. No high-grade arrhythmias 5. Patient triggered events associated with isolated PVCs.  CPX 01/2018 Peak VO2: 21.5 (108% predicted peak VO2) VE/VCO2 slope:  48 OUES: 1.84 Peak RER: 0.93 Echo 05/09/17 EF ~30% RV ok. Personally reviewed Echo 02/05/17 LVEF 15-20%, Grade 1 DD, Mild/Mod central MR, Mild LAE R/LHC 02/06/17 There is severe left ventricular systolic dysfunction. Dist Cx lesion, 20 %stenosed. 1st Mrg lesion, 60 %stenosed. Dist RCA lesion, 20 %stenosed. Mid LAD to Dist LAD lesion, 20 %stenosed.  Cardiac MRI 06/2017 1.  Moderate LVE with diffuse hypokinesis EF 35% 2. Small area of apical hyperenhancement on delayed gadolinium images 3.  Mild MR 4.  Mild LAE 5. Thickened tri leaflet AV with restricted leaflet motion suggest echo for AS correlation 6.  Normal RV size and function 7.  Normal aortic root 3.0 cm  Review of systems complete and found to be negative unless listed in HPI.   Past Medical History:  Diagnosis Date   Arthritis    ostearthritis -joints, greater left knee.   Breast cancer [facility])    Breast cancer, left breast (HCC) 2009   S/P surgery, radiation, chemotherapy.-no further oncology visits.   CHF (congestive heart failure) (HCC) dx'd 02/05/2017   Hyperlipidemia    Hypertension    Neuromuscular disorder (HCC)    neuropathy due to chemotherapy   Personal history  of chemotherapy 2009   Personal history of radiation therapy 2009   Seizures (HCC)    "childbirth related"   Sleep apnea    mild no C-PAP   SVT (supraventricular tachycardia) (HCC)    x1 episode -3 yrs ago- no issues since.All heart testing negative. No further cardiac visits needed.   Current Outpatient Medications  Medication Sig Dispense Refill   carvedilol (COREG) 3.125 MG tablet Take 1 tablet (3.125 mg total) by mouth 2 (two) times daily. 60 tablet 3   Coenzyme Q10  (COQ10) 100 MG CAPS Take 1 tablet by mouth daily.     empagliflozin (JARDIANCE) 10 MG TABS tablet Take 1 tablet (10 mg total) by mouth daily before breakfast. 90 tablet 3   ivabradine (CORLANOR) 5 MG TABS tablet Take 1 tablet (5 mg total) by mouth 2 (two) times daily with a meal. 60 tablet 3   losartan (COZAAR) 25 MG tablet Take 1 tablet (25 mg total) by mouth daily. 90 tablet 3   Magnesium 250 MG TABS Take 250 mg by mouth daily.     mexiletine (MEXITIL) 150 MG capsule TAKE 1 CAPSULE BY MOUTH 2 TIMES DAILY. 180 capsule 3   rosuvastatin (CRESTOR) 10 MG tablet Take 1 tablet (10 mg total) by mouth at bedtime. 90 tablet 1   spironolactone (ALDACTONE) 25 MG tablet Take 0.5 tablets (12.5 mg total) by mouth daily. 45 tablet 3   No current facility-administered medications for this encounter.   Allergies  Allergen Reactions   Effexor [Venlafaxine] Other (See Comments)    Caused depression, negative thoughts, and crying    Hydrocodone-Acetaminophen Itching and Nausea And Vomiting    Patient can tolerate with Zofran    Social History   Socioeconomic History   Marital status: Married    Spouse name: Not on file   Number of children: 2   Years of education: Not on file   Highest education level: Not on file  Occupational History   Occupation: anesthetist    Employer: [employer]    Comment: retired June 2019  Tobacco Use   Smoking status: Former    Current packs/day: 0.00    Average packs/day: 2.0 packs/day for 10.0 years (20.0 ttl pk-yrs)    Types: Cigarettes    Start date: 07/10/1979    Quit date: 07/09/1989    Years since quitting: 33.9   Smokeless tobacco: Never  Vaping Use   Vaping status: Never Used  Substance and Sexual Activity   Alcohol use: No   Drug use: No   Sexual activity: Yes  Other Topics Concern   Not on file  Social History Narrative   Patient with caregiver stress due to spouses declining health   Some conflicts with children and addictions (coping well)     Social Drivers of Health   Financial Resource Strain: Low Risk  (01/23/2023)   Overall Financial Resource Strain (CARDIA)    Difficulty of Paying Living Expenses: Not hard at all  Food Insecurity: No Food Insecurity (01/23/2023)   Hunger Vital Sign    Worried About Running Out of Food in the Last Year: Never true    Ran Out of Food in the Last Year: Never true  Transportation Needs: No Transportation Needs (01/23/2023)   PRAPARE - Administrator, Civil Service (Medical): No    Lack of Transportation (Non-Medical): No  Physical Activity: Sufficiently Active (01/23/2023)   Exercise Vital Sign    Days of Exercise per Week: 5 days  Minutes of Exercise per Session: 30 min  Stress: Stress Concern Present (01/23/2023)   [employer] of Occupational Health - Occupational Stress Questionnaire    Feeling of Stress : Rather much  Social Connections: Moderately Integrated (01/23/2023)   Social Connection and Isolation Panel [NHANES]    Frequency of Communication with Friends and Family: More than three times a week    Frequency of Social Gatherings with Friends and Family: Twice a week    Attends Religious Services: Never    Database administrator or Organizations: Yes    Attends Engineer, structural: More than 4 times per year    Marital Status: Married  Catering manager Violence: Not At Risk (01/23/2023)   Humiliation, Afraid, Rape, and Kick questionnaire    Fear of Current or Ex-Partner: No    Emotionally Abused: No    Physically Abused: No    Sexually Abused: No   Family History  Problem Relation Age of Onset   Heart disease Mother    Hypertension Father    Breast cancer Sister    Lung cancer Sister    Skin cancer Brother    Vitals:   06/24/23 1030  BP: 132/86  Pulse: 81  SpO2: 100%  Weight: 64.5 kg (142 lb 3.2 oz)  Height: 5\' 3"  (1.6 m)    Wt Readings from Last 3 Encounters:  06/24/23 64.5 kg (142 lb 3.2 oz)  02/27/23 63.4 kg (139 lb 12.8 oz)   01/02/23 63.6 kg (140 lb 4 oz)    PHYSICAL EXAM: General:  Well appearing. No resp difficulty HEENT: normal Neck: supple. no JVD. Carotids 2+ bilat; + bruits. No lymphadenopathy or thryomegaly appreciated. Cor: PMI nondisplaced. Regular rate & rhythm. 2/6 AS s2 ok  Lungs: clear Abdomen: soft, nontender, nondistended. No hepatosplenomegaly. No bruits or masses. Good bowel sounds. Extremities: no cyanosis, clubbing, rash, edema Neuro: alert & orientedx3, cranial nerves grossly intact. moves all 4 extremities w/o difficulty. Affect pleasant   ECG: NSR  LBBB 154 ms Personally reviewed   ASSESSMENT & PLAN:  1. Chronic systolic CHF: NICM, EF 15-20%, felt to be related to doxorubicin therapy. But also with frequent PVC's, holter monitor showed 1% PVC's but patient complained of very frequent PVC's.  - Cath 8/18 mild non-obs CAD. LAD 20%, OM-1 60% RCA 20% - cMRI 12/18 EF 35% minimal LGE  - Echo 05/2017 ~30-35%. LV size - Echo 03/09/18 EF 45-50% - CPX 01/2018 Peak VO2: 21.5 (108% predicted peak VO2) VE/VCO2 slope:  48 Peak RER: 0.93 - Echo 10/20 EF 40-45% mild to moderate AS - Echo 8/21 EF 50-55% - Echo  08/21/21: EF 45-50% (I thought EF 40-45%) with LBBB dyssynchrony. Mild-mod AS mean gradient 13 AVA 1.01 cm2  - Echo 4/24 EF 35-40%, LBBB dyssynchrony, at least moderate low flow low gradient AS  - Cath 4/24 minimal CAD - Echo  02/27/23  EF 35-40% (I thought 30-35%) with regional WMA and moderate LFLG AS  - cMRI 11/24:  LVEF 31% Septal-lateral dyssynchrony consistent with LBBB. No LGE. Normal RVEF 50% - Stable NYHA II-III - Continue Spiro 12.5 mg daily. Did not tolerate higher dose. 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 Refer to EP for CRT-D  2.  Palpitations/Frequent PVCs - Holter monitor 03/2017 with  PVC burden <1%. - Zio 9/21 PVCs 1.3% (monomorphic) - PVCs suppressed with mexilitene 150 bid - No change  3. Aortic stenosis - echo 10/20 AoV mean 15 AVA 0.99 (mild to moderate AS) - echo 8/21 Mild to moderate aortic valve stenosis. AVA 1.48 cm. Aortic valve mean gradient measures 18 mmHg. - Echo  08/21/21: EF 45-50% (I thought EF 40-45%) with LBBB dyssynchrony. Mild-mod AS mean gradient 13 AVA 1.01 cm2  -Echo 4/24 EF 35-40%. At least moderate low flow low gradient AS with mean gradient of 18 mmHg, AVA by continuity equation 0.79 and DVI 0.25.  - Echo  02/27/23  EF 35-40% (I thought 30-35%) with regional WMA and moderate AS (LFLG) - fWill likely need TAVR down the road. Follow with q6 month echos  4. Pulmonary nodule - Stable on f/u CT 11/18. No change.   6. Hyperlipidemia - now on Crestor - LDL 54 5/24 - followed by PCP  7. Hx of SVT 4/24 - Terminated with adenosine previously - NSR today   Arvilla Meres, MD  11:02 AM  Advanced Heart Failure Clinic [facility] [address] Heart and Vascular [place] [address] [phone] (office) [phone] (fax)

## 2023-06-26 ENCOUNTER — Other Ambulatory Visit: Payer: Self-pay

## 2023-06-27 ENCOUNTER — Other Ambulatory Visit (HOSPITAL_COMMUNITY): Payer: Self-pay

## 2023-07-01 ENCOUNTER — Other Ambulatory Visit (HOSPITAL_COMMUNITY): Payer: Self-pay

## 2023-07-01 ENCOUNTER — Other Ambulatory Visit (HOSPITAL_COMMUNITY): Payer: Self-pay | Admitting: Internal Medicine

## 2023-07-01 MED ORDER — IVABRADINE HCL 5 MG PO TABS
5.0000 mg | ORAL_TABLET | Freq: Two times a day (BID) | ORAL | 2 refills | Status: DC
  Filled 2023-07-01: qty 180, 90d supply, fill #0
  Filled 2023-07-24: qty 60, 30d supply, fill #0
  Filled 2023-08-21 – 2023-09-02 (×2): qty 60, 30d supply, fill #1
  Filled 2023-09-04 – 2023-09-06 (×2): qty 180, 90d supply, fill #1
  Filled 2023-12-04: qty 120, 60d supply, fill #2
  Filled 2023-12-04: qty 60, 30d supply, fill #2
  Filled ????-??-??: fill #2

## 2023-07-02 ENCOUNTER — Other Ambulatory Visit (HOSPITAL_COMMUNITY): Payer: Self-pay

## 2023-07-02 ENCOUNTER — Encounter: Payer: Self-pay | Admitting: Pharmacist

## 2023-07-02 NOTE — Progress Notes (Signed)
Pharmacy Quality Measure Review  This patient is appearing on a report for being at risk of failing the adherence measure for hypertension (ACEi/ARB) medications this calendar year.   Medication: losartan 25mg  Last fill date: 03/20/2023 for 90 day supply  Spoke with patient today.  Reminded patient she is due to refill losartan. She reports she is still taking losartan daily. She is planning to pick up a refill for losartan and her other medications today.   Henrene Pastor, PharmD Clinical Pharmacist [facility]  Population Health [phone]

## 2023-07-04 NOTE — Telephone Encounter (Signed)
error 

## 2023-07-22 ENCOUNTER — Other Ambulatory Visit (HOSPITAL_COMMUNITY): Payer: Self-pay

## 2023-07-24 ENCOUNTER — Other Ambulatory Visit (HOSPITAL_COMMUNITY): Payer: Self-pay

## 2023-07-24 ENCOUNTER — Other Ambulatory Visit: Payer: Self-pay

## 2023-07-24 ENCOUNTER — Other Ambulatory Visit: Payer: Self-pay | Admitting: Family Medicine

## 2023-07-24 MED ORDER — ROSUVASTATIN CALCIUM 10 MG PO TABS
10.0000 mg | ORAL_TABLET | Freq: Every evening | ORAL | 1 refills | Status: DC
  Filled 2023-07-24: qty 90, 90d supply, fill #0
  Filled 2023-08-21 – 2023-10-27 (×2): qty 90, 90d supply, fill #1

## 2023-08-05 ENCOUNTER — Ambulatory Visit: Payer: PPO | Attending: Cardiovascular Disease | Admitting: Cardiovascular Disease

## 2023-08-05 ENCOUNTER — Encounter: Payer: Self-pay | Admitting: Cardiovascular Disease

## 2023-08-05 VITALS — BP 100/66 | HR 68 | Ht 63.0 in | Wt 146.0 lb

## 2023-08-05 DIAGNOSIS — I447 Left bundle-branch block, unspecified: Secondary | ICD-10-CM

## 2023-08-05 DIAGNOSIS — I5022 Chronic systolic (congestive) heart failure: Secondary | ICD-10-CM

## 2023-08-05 DIAGNOSIS — I471 Supraventricular tachycardia, unspecified: Secondary | ICD-10-CM

## 2023-08-05 DIAGNOSIS — I493 Ventricular premature depolarization: Secondary | ICD-10-CM | POA: Diagnosis not present

## 2023-08-05 NOTE — Patient Instructions (Addendum)
Medication Instructions:  Your physician recommends that you continue on your current medications as directed. Please refer to the Current Medication list given to you today. *If you need a refill on your cardiac medications before your next appointment, please call your pharmacy*   Lab Work: CBC and BMET within 30 days of procedure - this can be completed at [facility] - does not require an appointment and this does not need to be fasting If you have labs (blood work) drawn today and your tests are completely normal, you will receive your results only by: MyChart Message (if you have MyChart) OR A paper copy in the mail If you have any lab test that is abnormal or we need to change your treatment, we will call you to review the results.   Testing/Procedures: BiV ICD - scheduled for Wednesday, March 5 Your physician has recommended that you have a defibrillator inserted. An implantable cardioverter defibrillator (ICD) is a small device that is placed in your chest or, in rare cases, your abdomen. This device uses electrical pulses or shocks to help control life-threatening, irregular heartbeats that could lead the heart to suddenly stop beating (sudden cardiac arrest). Leads are attached to the ICD that goes into your heart. This is done in the hospital and usually requires an overnight stay. Please see the instruction sheet given to you today for more information.    Follow-Up: At [facility], you and your health needs are our priority.  As part of our continuing mission to provide you with exceptional heart care, we have created designated Provider Care Teams.  These Care Teams include your primary Cardiologist (physician) and Advanced Practice Providers (APPs -  Physician Assistants and Nurse Practitioners) who all work together to provide you with the care you need, when you need it.  We recommend signing up for the patient portal called "MyChart".  Sign up information is provided  on this After Visit Summary.  MyChart is used to connect with patients for Virtual Visits (Telemedicine).  Patients are able to view lab/test results, encounter notes, upcoming appointments, etc.  Non-urgent messages can be sent to your provider as well.   To learn more about what you can do with MyChart, go to [email or web address].    Your next appointment:   We will schedule follow up after your ICD implant  Provider:   York Pellant, MD

## 2023-08-05 NOTE — Progress Notes (Signed)
Electrophysiology Office Note:    Date:  08/05/2023   ID:  Patricia Collier, DOB [birth year], MRN [id number]  PCP:  Sheliah Hatch, MD   [facility] Providers Cardiologist:  None     Referring MD: Dolores Patty, MD   History of Present Illness:    Patricia Collier is a 78 y.o. 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     She had grade 3 invasive ductal carcinoma of the left breast and received doxorubicin in 2009 and cyclophosphamide, paclitaxel.  She was diagnosed with acute heart failure in July 2019.  EF was 15 to 20% at that time.  Monitor in 2018 showed multiformed PVCs and periods of bigeminy of the total PVC burden was less than 1%.    EF recovered, 50 to 55% in 2021 EF began to decline on echo 2023, 40 to 45% with left bundle branch block in dyssynchrony.  She was admitted in April 2024 with SVT that terminated with adenosine.  Echo at that time showed EF 35 to 40%.  She has since had a cardiac MRI that showed further decline in ejection fraction, now less than 35% despite GDMT.  She is very active, enjoys a fulfilled life.     Today, she is at baseline.  She has fatigue which limits her activities.  No significant palpitations.  EKGs/Labs/Other Studies Reviewed Today:     Echocardiogram:  TTE August 2024 EF 35 to 40% without regional wall motion abnormalities.  Grade 1 diastolic dysfunction.  Wall motion abnormalities were noted.    Advanced imaging:  CMR November 2024 EF 31%.  Dyssynchrony consistent with left ulnar branch block.  Global hypokinesis though septal and anterior walls appear worse.  No LGE seen.    EKG:   EKG Interpretation Date/Time:  Monday August 05 2023 11:59:58 EST Ventricular Rate:  68 PR Interval:  154 QRS Duration:  150 QT  Interval:  488 QTC Calculation: 518 R Axis:   219  Text Interpretation: Normal sinus rhythm Right superior axis deviation Non-specific intra-ventricular conduction block Minimal voltage criteria for LVH, may be normal variant ( Cornell product ) When compared with ECG of 27-Feb-2023 10:06, No significant change was found Confirmed by York Pellant [phone]) on 08/05/2023 12:15:39 PM     Physical Exam:    VS:  BP 100/66 (BP Location: Left Arm, Patient Position: Sitting, Cuff Size: Normal)   Pulse 68   Ht 5\' 3"  (1.6 m)   Wt 146 lb (66.2 kg)   SpO2 98%   BMI 25.86 kg/m     Wt Readings from Last 3 Encounters:  08/05/23 146 lb (66.2 kg)  06/24/23 142 lb 3.2 oz (64.5 kg)  02/27/23 139 lb 12.8 oz (63.4 kg)     GEN: Well nourished, well developed in no acute distress CARDIAC: RRR, no murmurs, rubs, gallops RESPIRATORY:  Normal work of breathing MUSCULOSKELETAL: no edema    ASSESSMENT & PLAN:     CHFrEF NICM -EF less than 35% on MRI despite GDMT EF has been progressively declining Since she has NYHA II symptoms Imaging has shown dyssynchrony consistent with bundle branch block We discussed the indication and rationale for placement of a CRT D device We discussed the relative indication for defibrillator and nonischemic cardiomyopathy due to excellent prognosis with resynchronization.  She would prefer to have  defibrillator lead placed, which I think is reasonable.  I discussed the indication for the procedure and the logistics, risks, potential benefit, and after care. I specifically explained that risks include but are not limited to infection, bleeding,damage to blood vessels, lung, and the heart -- but risk of prolonged hospitalization, need for surgery, or the event of stroke, heart attack, or death are low but not zero.    LBBB Will place left ventricular lead by coronary sinus approach  SVT See ECG November 06, 2022  Frequent PVCs Were suppressed with mexiletine 150 mg  twice daily   Signed, Maurice Small, MD  08/05/2023 12:20 PM    [facility]

## 2023-08-13 NOTE — Progress Notes (Signed)
 This encounter was created in error - please disregard.

## 2023-08-21 ENCOUNTER — Other Ambulatory Visit (HOSPITAL_COMMUNITY): Payer: Self-pay

## 2023-08-21 ENCOUNTER — Other Ambulatory Visit: Payer: Self-pay

## 2023-08-23 ENCOUNTER — Other Ambulatory Visit (HOSPITAL_COMMUNITY): Payer: Self-pay

## 2023-08-23 ENCOUNTER — Other Ambulatory Visit: Payer: Self-pay

## 2023-08-24 ENCOUNTER — Other Ambulatory Visit (HOSPITAL_COMMUNITY): Payer: Self-pay

## 2023-08-27 ENCOUNTER — Other Ambulatory Visit (HOSPITAL_COMMUNITY): Payer: Self-pay

## 2023-08-28 ENCOUNTER — Other Ambulatory Visit: Payer: Self-pay

## 2023-08-28 ENCOUNTER — Other Ambulatory Visit (HOSPITAL_COMMUNITY): Payer: Self-pay

## 2023-09-02 ENCOUNTER — Other Ambulatory Visit (HOSPITAL_COMMUNITY): Payer: Self-pay

## 2023-09-02 ENCOUNTER — Other Ambulatory Visit: Payer: Self-pay

## 2023-09-03 ENCOUNTER — Telehealth (HOSPITAL_COMMUNITY): Payer: Self-pay | Admitting: Pharmacy Technician

## 2023-09-03 ENCOUNTER — Other Ambulatory Visit (HOSPITAL_COMMUNITY): Payer: Self-pay

## 2023-09-03 DIAGNOSIS — I471 Supraventricular tachycardia, unspecified: Secondary | ICD-10-CM | POA: Diagnosis not present

## 2023-09-03 DIAGNOSIS — I447 Left bundle-branch block, unspecified: Secondary | ICD-10-CM | POA: Diagnosis not present

## 2023-09-03 DIAGNOSIS — I5022 Chronic systolic (congestive) heart failure: Secondary | ICD-10-CM | POA: Diagnosis not present

## 2023-09-03 DIAGNOSIS — I493 Ventricular premature depolarization: Secondary | ICD-10-CM | POA: Diagnosis not present

## 2023-09-03 NOTE — Telephone Encounter (Signed)
 Patient Advocate Encounter   Received notification from [facility] [employer] that prior authorization for Ivabradine is required.   PA submitted on CoverMyMeds Key [id number] Status is pending   Will continue to follow.

## 2023-09-04 ENCOUNTER — Other Ambulatory Visit (HOSPITAL_COMMUNITY): Payer: Self-pay

## 2023-09-04 LAB — CBC
Hematocrit: 43.2 % (ref 34.0–46.6)
Hemoglobin: 14.6 g/dL (ref 11.1–15.9)
MCH: 31 pg (ref 26.6–33.0)
MCHC: 33.8 g/dL (ref 31.5–35.7)
MCV: 92 fL (ref 79–97)
Platelets: 208 10*3/uL (ref 150–450)
RBC: 4.71 x10E6/uL (ref 3.77–5.28)
RDW: 12.8 % (ref 11.7–15.4)
WBC: 4.6 10*3/uL (ref 3.4–10.8)

## 2023-09-04 LAB — BASIC METABOLIC PANEL
BUN/Creatinine Ratio: 21 (ref 12–28)
BUN: 16 mg/dL (ref 8–27)
CO2: 20 mmol/L (ref 20–29)
Calcium: 9.5 mg/dL (ref 8.7–10.3)
Chloride: 107 mmol/L — ABNORMAL HIGH (ref 96–106)
Creatinine, Ser: 0.76 mg/dL (ref 0.57–1.00)
Glucose: 87 mg/dL (ref 70–99)
Potassium: 4.9 mmol/L (ref 3.5–5.2)
Sodium: 143 mmol/L (ref 134–144)
eGFR: 82 mL/min/{1.73_m2} (ref >59)

## 2023-09-04 NOTE — Telephone Encounter (Signed)
 Advanced Heart Failure Patient Advocate Encounter  Prior Authorization for Ivabradine has been approved.    PA# [id number] Effective dates: 09/04/23 through 09/03/24  Archer Asa, CPhT

## 2023-09-05 ENCOUNTER — Encounter: Payer: Self-pay | Admitting: Cardiovascular Disease

## 2023-09-06 ENCOUNTER — Other Ambulatory Visit (HOSPITAL_COMMUNITY): Payer: Self-pay

## 2023-09-11 NOTE — Pre-Procedure Instructions (Signed)
 Instructed patient on the following items: Arrival time 1230 May have light breakfast before 7am, nothing after 7am No meds AM of procedure Responsible person to drive you home and stay with you for 24 hrs Wash with special soap night before and morning of procedure

## 2023-09-12 ENCOUNTER — Encounter (HOSPITAL_COMMUNITY)
Admission: RE | Disposition: A | Discharge status: Home / Self Care | Payer: Self-pay | Source: Home / Self Care | Attending: Cardiovascular Disease

## 2023-09-12 ENCOUNTER — Encounter (HOSPITAL_COMMUNITY): Payer: Self-pay | Admitting: Cardiovascular Disease

## 2023-09-12 ENCOUNTER — Ambulatory Visit (HOSPITAL_COMMUNITY)
Admission: RE | Admit: 2023-09-12 | Discharge: 2023-09-13 | Disposition: A | Discharge status: Home / Self Care | Payer: PPO | Attending: Cardiovascular Disease | Admitting: Cardiovascular Disease

## 2023-09-12 ENCOUNTER — Other Ambulatory Visit: Payer: Self-pay

## 2023-09-12 ENCOUNTER — Ambulatory Visit (HOSPITAL_COMMUNITY)

## 2023-09-12 DIAGNOSIS — I5022 Chronic systolic (congestive) heart failure: Secondary | ICD-10-CM | POA: Diagnosis not present

## 2023-09-12 DIAGNOSIS — I428 Other cardiomyopathies: Secondary | ICD-10-CM | POA: Insufficient documentation

## 2023-09-12 DIAGNOSIS — I447 Left bundle-branch block, unspecified: Secondary | ICD-10-CM | POA: Insufficient documentation

## 2023-09-12 DIAGNOSIS — I493 Ventricular premature depolarization: Secondary | ICD-10-CM | POA: Diagnosis not present

## 2023-09-12 DIAGNOSIS — Z95810 Presence of automatic (implantable) cardiac defibrillator: Secondary | ICD-10-CM | POA: Diagnosis not present

## 2023-09-12 DIAGNOSIS — I471 Supraventricular tachycardia, unspecified: Secondary | ICD-10-CM | POA: Diagnosis not present

## 2023-09-12 DIAGNOSIS — Z79899 Other long term (current) drug therapy: Secondary | ICD-10-CM | POA: Diagnosis not present

## 2023-09-12 DIAGNOSIS — Z853 Personal history of malignant neoplasm of breast: Secondary | ICD-10-CM | POA: Insufficient documentation

## 2023-09-12 DIAGNOSIS — I509 Heart failure, unspecified: Secondary | ICD-10-CM

## 2023-09-12 HISTORY — PX: LEAD REVISION/REPAIR: EP1213

## 2023-09-12 HISTORY — PX: BIV ICD INSERTION CRT-D: EP1195

## 2023-09-12 SURGERY — BIV ICD INSERTION CRT-D

## 2023-09-12 MED ORDER — FENTANYL CITRATE (PF) 100 MCG/2ML IJ SOLN
INTRAMUSCULAR | Status: AC
Start: 2023-09-12 — End: ?
  Filled 2023-09-12: qty 2

## 2023-09-12 MED ORDER — CARVEDILOL 3.125 MG PO TABS
3.1250 mg | ORAL_TABLET | Freq: Two times a day (BID) | ORAL | Status: DC
  Administered 2023-09-13: 3.125 mg via ORAL
  Filled 2023-09-12: qty 1

## 2023-09-12 MED ORDER — IOHEXOL 350 MG/ML SOLN
INTRAVENOUS | Status: DC | PRN
  Administered 2023-09-12: 10 mL

## 2023-09-12 MED ORDER — SODIUM CHLORIDE 0.9 % IV SOLN: INTRAVENOUS | Status: DC

## 2023-09-12 MED ORDER — SODIUM CHLORIDE 0.9 % IV SOLN
80.0000 mg | INTRAVENOUS | Status: AC
  Administered 2023-09-12: 80 mg

## 2023-09-12 MED ORDER — MIDAZOLAM HCL 5 MG/5ML IJ SOLN
INTRAMUSCULAR | Status: AC
  Filled 2023-09-12: qty 5

## 2023-09-12 MED ORDER — LIDOCAINE HCL (PF) 1 % IJ SOLN
INTRAMUSCULAR | Status: DC | PRN
  Administered 2023-09-12: 60 mL

## 2023-09-12 MED ORDER — LOSARTAN POTASSIUM 25 MG PO TABS
25.0000 mg | ORAL_TABLET | Freq: Every day | ORAL | Status: DC
  Administered 2023-09-13: 25 mg via ORAL
  Filled 2023-09-12: qty 1

## 2023-09-12 MED ORDER — SODIUM CHLORIDE 0.9 % IV SOLN
INTRAVENOUS | Status: AC
  Filled 2023-09-12: qty 2

## 2023-09-12 MED ORDER — IVABRADINE HCL 5 MG PO TABS
5.0000 mg | ORAL_TABLET | Freq: Two times a day (BID) | ORAL | Status: DC
  Administered 2023-09-13: 5 mg via ORAL
  Filled 2023-09-12: qty 1

## 2023-09-12 MED ORDER — SPIRONOLACTONE 12.5 MG HALF TABLET
12.5000 mg | ORAL_TABLET | Freq: Every day | ORAL | Status: DC
  Administered 2023-09-13: 12.5 mg via ORAL
  Filled 2023-09-12: qty 1

## 2023-09-12 MED ORDER — MIDAZOLAM HCL 5 MG/5ML IJ SOLN
INTRAMUSCULAR | Status: DC | PRN
  Administered 2023-09-12 (×3): 1 mg via INTRAVENOUS

## 2023-09-12 MED ORDER — LIDOCAINE HCL (PF) 1 % IJ SOLN
INTRAMUSCULAR | Status: AC
  Filled 2023-09-12: qty 60

## 2023-09-12 MED ORDER — FENTANYL CITRATE (PF) 100 MCG/2ML IJ SOLN
INTRAMUSCULAR | Status: DC | PRN
  Administered 2023-09-12: 25 ug via INTRAVENOUS

## 2023-09-12 MED ORDER — FENTANYL CITRATE (PF) 100 MCG/2ML IJ SOLN
INTRAMUSCULAR | Status: DC | PRN
  Administered 2023-09-12 (×3): 25 ug via INTRAVENOUS

## 2023-09-12 MED ORDER — CEFAZOLIN SODIUM-DEXTROSE 1-4 GM/50ML-% IV SOLN
1.0000 g | Freq: Four times a day (QID) | INTRAVENOUS | Status: DC
  Administered 2023-09-12 – 2023-09-13 (×2): 1 g via INTRAVENOUS
  Filled 2023-09-12 (×4): qty 50

## 2023-09-12 MED ORDER — HEPARIN (PORCINE) IN NACL 1000-0.9 UT/500ML-% IV SOLN
INTRAVENOUS | Status: DC | PRN
Start: 2023-09-12 — End: 2023-09-12

## 2023-09-12 MED ORDER — ACETAMINOPHEN 325 MG PO TABS
ORAL_TABLET | ORAL | Status: AC
  Filled 2023-09-12: qty 2

## 2023-09-12 MED ORDER — MIDAZOLAM HCL 2 MG/2ML IJ SOLN
INTRAMUSCULAR | Status: AC
  Filled 2023-09-12: qty 2

## 2023-09-12 MED ORDER — CEFAZOLIN SODIUM-DEXTROSE 2-4 GM/100ML-% IV SOLN
2.0000 g | INTRAVENOUS | Status: AC
  Administered 2023-09-12: 2 g via INTRAVENOUS

## 2023-09-12 MED ORDER — CEFAZOLIN SODIUM-DEXTROSE 2-4 GM/100ML-% IV SOLN
INTRAVENOUS | Status: AC
  Filled 2023-09-12: qty 100

## 2023-09-12 MED ORDER — SODIUM CHLORIDE 0.9 % IV SOLN
INTRAVENOUS | Status: DC | PRN
  Administered 2023-09-12: 80 mg

## 2023-09-12 MED ORDER — ROSUVASTATIN CALCIUM 5 MG PO TABS
10.0000 mg | ORAL_TABLET | Freq: Every morning | ORAL | Status: DC
  Administered 2023-09-13: 10 mg via ORAL
  Filled 2023-09-12 (×2): qty 2

## 2023-09-12 MED ORDER — MEXILETINE HCL 150 MG PO CAPS
150.0000 mg | ORAL_CAPSULE | Freq: Two times a day (BID) | ORAL | Status: DC
  Administered 2023-09-13: 150 mg via ORAL
  Filled 2023-09-12 (×2): qty 1

## 2023-09-12 MED ORDER — EMPAGLIFLOZIN 10 MG PO TABS
10.0000 mg | ORAL_TABLET | Freq: Every day | ORAL | Status: DC
  Administered 2023-09-13: 10 mg via ORAL
  Filled 2023-09-12: qty 1

## 2023-09-12 MED ORDER — MIDAZOLAM HCL 5 MG/5ML IJ SOLN
INTRAMUSCULAR | Status: DC | PRN
  Administered 2023-09-12: 1 mg via INTRAVENOUS

## 2023-09-12 MED ORDER — FENTANYL CITRATE (PF) 100 MCG/2ML IJ SOLN
INTRAMUSCULAR | Status: AC
  Filled 2023-09-12: qty 2

## 2023-09-12 MED ORDER — ACETAMINOPHEN 325 MG PO TABS
325.0000 mg | ORAL_TABLET | ORAL | Status: DC | PRN
  Administered 2023-09-12 – 2023-09-13 (×3): 650 mg via ORAL
  Filled 2023-09-12 (×2): qty 2

## 2023-09-12 MED ORDER — LIDOCAINE HCL 1 % IJ SOLN
INTRAMUSCULAR | Status: AC
  Filled 2023-09-12: qty 40

## 2023-09-12 MED ORDER — POVIDONE-IODINE 10 % EX SWAB
2.0000 | Freq: Once | CUTANEOUS | Status: AC
  Administered 2023-09-12: 2 via TOPICAL

## 2023-09-12 MED ORDER — ONDANSETRON HCL 4 MG/2ML IJ SOLN: 4.0000 mg | Freq: Four times a day (QID) | INTRAMUSCULAR | Status: DC | PRN

## 2023-09-12 SURGICAL SUPPLY — 20 items
BAG SNAP BAND KOVER 36X36 (MISCELLANEOUS) IMPLANT
BALLN COR SINUS VENO 6FR 80 (BALLOONS) ×1 IMPLANT
BALLOON COR SINUS VENO 6FR 80 (BALLOONS) IMPLANT
CABLE SURGICAL S-101-97-12 (CABLE) ×2 IMPLANT
CATH CPS DIRECT 135 DS2C020 (CATHETERS) IMPLANT
ICD GALLANT HFCRTD CDHFA500Q (ICD Generator) IMPLANT
KIT ESSENTIALS PG (KITS) IMPLANT
LEAD DURATA 7122Q-58CM (Lead) IMPLANT
LEAD QUARTET 1458QL-86 (Lead) IMPLANT
LEAD ULTIPACE 52 LPA1231/52 (Lead) IMPLANT
PAD DEFIB RADIO PHYSIO CONN (PAD) ×2 IMPLANT
QUARTET 1458QL-86 (Lead) ×1 IMPLANT
SHEATH 7FR PRELUDE SNAP 13 (SHEATH) IMPLANT
SHEATH 9.5FR PRELUDE SNAP 13 (SHEATH) IMPLANT
SHEATH WORLEY 9FR 62CM (SHEATH) IMPLANT
SLITTER AGILIS HISPRO (INSTRUMENTS) IMPLANT
TRAY PACEMAKER INSERTION (PACKS) ×2 IMPLANT
WIRE ACUITY WHISPER EDS 4648 (WIRE) IMPLANT
WIRE HI TORQ VERSACORE-J 145CM (WIRE) IMPLANT
WIRE MICRO SET SILHO 5FR 7 (SHEATH) IMPLANT

## 2023-09-12 SURGICAL SUPPLY — 5 items
CABLE SURGICAL S-101-97-12 (CABLE) ×1 IMPLANT
PAD DEFIB RADIO PHYSIO CONN (PAD) ×1 IMPLANT
POUCH AIGIS-R ANTIBACT ICD (Mesh General) ×1 IMPLANT
POUCH AIGIS-R ANTIBACT ICD LRG (Mesh General) IMPLANT
TRAY PACEMAKER INSERTION (PACKS) ×1 IMPLANT

## 2023-09-12 NOTE — H&P (Signed)
 Electrophysiology Office Note:    Date:  09/12/2023   ID:  Patricia Collier, DOB [birth year], MRN [id number]  PCP:  Sheliah Hatch, MD   [facility] Providers Cardiologist:  None Electrophysiologist:  Maurice Small, MD     Referring MD: No ref. provider found   History of Present Illness:    Patricia Collier is a 78 y.o. 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     She had grade 3 invasive ductal carcinoma of the left breast and received doxorubicin in 2009 and cyclophosphamide, paclitaxel.  She was diagnosed with acute heart failure in July 2019.  EF was 15 to 20% at that time.  Monitor in 2018 showed multiformed PVCs and periods of bigeminy of the total PVC burden was less than 1%.    EF recovered, 50 to 55% in 2021 EF began to decline on echo 2023, 40 to 45% with left bundle branch block in dyssynchrony.  She was admitted in April 2024 with SVT that terminated with adenosine.  Echo at that time showed EF 35 to 40%.  She has since had a cardiac MRI that showed further decline in ejection fraction, now less than 35% despite GDMT.  She is very active, enjoys a fulfilled life.     Today, she is at baseline.  She has fatigue which limits her activities.  No significant palpitations. Ivabradine has been prescribed; otherwise, she has not had any changes in medications or diagnoses since our last clinic visit.  EKGs/Labs/Other Studies Reviewed Today:     Echocardiogram:  TTE August 2024 EF 35 to 40% without regional wall motion abnormalities.  Grade 1 diastolic dysfunction.  Wall motion abnormalities were noted.    Advanced imaging:  CMR November 2024 EF 31%.  Dyssynchrony consistent with left ulnar branch block.  Global hypokinesis though septal and anterior walls appear  worse.  No LGE seen.    EKG:         Physical Exam:    VS:  There were no vitals taken for this visit.    Wt Readings from Last 3 Encounters:  08/05/23 66.2 kg  06/24/23 64.5 kg  02/27/23 63.4 kg     GEN: Well nourished, well developed in no acute distress CARDIAC: RRR, no murmurs, rubs, gallops RESPIRATORY:  Normal work of breathing MUSCULOSKELETAL: no edema    ASSESSMENT & PLAN:     CHFrEF NICM -EF less than 35% on MRI despite GDMT EF has been progressively declining Since she has NYHA II symptoms Imaging has shown dyssynchrony consistent with bundle branch block We discussed the indication and rationale for placement of a CRT D device We discussed the relative indication for defibrillator and nonischemic cardiomyopathy due to excellent prognosis with resynchronization.  She would prefer to have defibrillator lead placed, which I think is reasonable.  I discussed the indication for the procedure and the logistics, risks, potential benefit, and after care. I specifically explained that risks include but are not limited to infection, bleeding,damage to blood vessels, lung, and the heart -- but risk of prolonged hospitalization, need for surgery, or the event of stroke, heart attack, or death are low but not zero.    LBBB Will place left ventricular lead by coronary sinus approach  SVT See ECG November 06, 2022  Frequent PVCs Were suppressed with mexiletine 150  mg twice daily   Signed, Maurice Small, MD  09/12/2023 12:11 PM    [facility]

## 2023-09-12 NOTE — Progress Notes (Addendum)
 Patient has new onset chest pain at 17:35. MD Mealor notified and said to do no interventions. Patient will be going back to procedure to have pacemaker checked

## 2023-09-12 NOTE — Plan of Care (Signed)
  Problem: Education: Goal: Knowledge of cardiac device and self-care will improve Outcome: Progressing Goal: Ability to safely manage health related needs after discharge will improve Outcome: Progressing   Problem: Education: Goal: Knowledge of General Education information will improve Description: Including pain rating scale, medication(s)/side effects and non-pharmacologic comfort measures Outcome: Progressing

## 2023-09-12 NOTE — Discharge Instructions (Addendum)
 After Your ICD (Implantable Cardiac Defibrillator)   You have a Abbott ICD  ACTIVITY Do not lift your arm above shoulder height for 1 week after your procedure. After 7 days, you may progress as below.  You should remove your sling 24 hours after your procedure, unless otherwise instructed by your provider.     Thursday September 19, 2023  Friday September 20, 2023 Saturday September 21, 2023 Sunday September 22, 2023   Do not lift, push, pull, or carry anything over 10 pounds with the affected arm until 6 weeks (Thursday October 24, 2023 ) after your procedure.   You may drive AFTER your wound check, unless you have been told otherwise by your provider.   Ask your healthcare provider when you can go back to work   INCISION/Dressing If you are on a blood thinner such as Coumadin, Xarelto, Eliquis, Plavix, or Pradaxa please confirm with your provider when this should be resumed.   If large square, outer bandage is left in place, this can be removed after 24 hours from your procedure. Do not remove steri-strips or glue as below.   Monitor your defibrillator site for redness, swelling, and drainage. Call the device clinic at [phone] if you experience these symptoms or fever/chills.  If your incision is sealed with Steri-strips or staples, you may shower 7 days after your procedure or when told by your provider. Do not remove the steri-strips or let the shower hit directly on your site. You may wash around your site with soap and water.    If you were discharged in a sling, please do not wear this during the day more than 48 hours after your surgery unless otherwise instructed. This may increase the risk of stiffness and soreness in your shoulder.   Avoid lotions, ointments, or perfumes over your incision until it is well-healed.  You may use a hot tub or a pool AFTER your wound check appointment if the incision is completely closed.  Your ICD is designed to protect you from life threatening heart  rhythms. Because of this, you may receive a shock.   1 shock with no symptoms:  Call the office during business hours. 1 shock with symptoms (chest pain, chest pressure, dizziness, lightheadedness, shortness of breath, overall feeling unwell):  Call 911. If you experience 2 or more shocks in 24 hours:  Call 911. If you receive a shock, you should not drive for 6 months per the [place] DMV IF you receive appropriate therapy from your ICD.   ICD Alerts:  Some alerts are vibratory and others beep. These are NOT emergencies. Please call our office to let us know. If this occurs at night or on weekends, it can wait until the next business day. Send a remote transmission.  If your device is capable of reading fluid status (for heart failure), you will be offered monthly monitoring to review this with you.   DEVICE MANAGEMENT Remote monitoring is used to monitor your ICD from home. This monitoring is scheduled every 91 days by our office. It allows [place] to keep an eye on the functioning of your device to ensure it is working properly. You will routinely see your Electrophysiologist annually (more often if necessary).   You should receive your ID card for your new device in 4-8 weeks. Keep this card with you at all times once received. Consider wearing a medical alert bracelet or necklace.  Your ICD  may be MRI compatible. This will be discussed at your next  office visit/wound check.  You should avoid contact with strong electric or magnetic fields.   Do not use amateur (ham) radio equipment or electric (arc) welding torches. MP3 player headphones with magnets should not be used. Some devices are safe to use if held at least 12 inches (30 cm) from your defibrillator. These include power tools, lawn mowers, and speakers. If you are unsure if something is safe to use, ask your health care provider.  When using your cell phone, hold it to the ear that is on the opposite side from the defibrillator. Do not leave  your cell phone in a pocket over the defibrillator.  You may safely use electric blankets, heating pads, computers, and microwave ovens.  Call the office right away if: You have chest pain. You feel more than one shock. You feel more short of breath than you have felt before. You feel more light-headed than you have felt before. Your incision starts to open up.  This information is not intended to replace advice given to you by your health care provider. Make sure you discuss any questions you have with your health care provider.

## 2023-09-13 ENCOUNTER — Encounter (HOSPITAL_COMMUNITY): Payer: Self-pay | Admitting: Cardiovascular Disease

## 2023-09-13 ENCOUNTER — Ambulatory Visit (HOSPITAL_COMMUNITY)

## 2023-09-13 DIAGNOSIS — I493 Ventricular premature depolarization: Secondary | ICD-10-CM | POA: Diagnosis not present

## 2023-09-13 DIAGNOSIS — I471 Supraventricular tachycardia, unspecified: Secondary | ICD-10-CM | POA: Diagnosis not present

## 2023-09-13 DIAGNOSIS — Z853 Personal history of malignant neoplasm of breast: Secondary | ICD-10-CM | POA: Diagnosis not present

## 2023-09-13 DIAGNOSIS — I428 Other cardiomyopathies: Secondary | ICD-10-CM | POA: Diagnosis not present

## 2023-09-13 DIAGNOSIS — I5022 Chronic systolic (congestive) heart failure: Secondary | ICD-10-CM | POA: Diagnosis not present

## 2023-09-13 DIAGNOSIS — Z95810 Presence of automatic (implantable) cardiac defibrillator: Secondary | ICD-10-CM | POA: Diagnosis not present

## 2023-09-13 DIAGNOSIS — Z79899 Other long term (current) drug therapy: Secondary | ICD-10-CM | POA: Diagnosis not present

## 2023-09-13 DIAGNOSIS — I447 Left bundle-branch block, unspecified: Secondary | ICD-10-CM | POA: Diagnosis not present

## 2023-09-13 MED FILL — Midazolam HCl Inj 2 MG/2ML (Base Equivalent): INTRAMUSCULAR | Qty: 1 | Status: AC

## 2023-09-13 NOTE — Discharge Summary (Addendum)
 ELECTROPHYSIOLOGY PROCEDURE DISCHARGE SUMMARY    Patient ID: Patricia Collier,  MRN: [id number], DOB/AGE: [birth year] 78 y.o.  Admit date: 09/12/2023 Discharge date: 09/13/2023  Primary Care Physician: Sheliah Hatch, MD  Primary Cardiologist: Dr. Gala Romney Electrophysiologist: Dr. Nelly Laurence  Primary Discharge Diagnosis:  NICM PVCs LBBB  Secondary Discharge Diagnosis:  Breast cancer Hx of dose-dense doxorubicin in 2009 and cyclophosphamide x4, paclitaxel x12  SVT Adenosine sensitive  Allergies  Allergen Reactions   Effexor [Venlafaxine] Other (See Comments)    Caused depression, negative thoughts, and crying    Hydrocodone-Acetaminophen Itching and Nausea And Vomiting    Patient can tolerate with Zofran     Procedures This Admission:  1.  Implantation of a Abbott CRT-D on 09/12/23 by Dr Nelly Laurence.   DFT's were deferred at time of implant  RV lead impedance post procedure acutely elevated/symptoms c/w lead perforation 2. ICD lead revision 09/12/23, Dr. Nelly Laurence There were no immediate post procedure complications. 2.  CXR on 09/13/23 demonstrated no pneumothorax status post device implantation.   Brief HPI: Patricia Collier is a 78 y.o. female was referred to electrophysiology in the outpatient setting for consideration of ICD implantation.  Past medical history includes above.  The patient has persistent LV dysfunction despite guideline directed therapy.  Risks, benefits, and alternatives to ICD implantation were reviewed with the patient who wished to proceed.   Hospital Course:  The patient was admitted and underwent implantation of a CRT-D with details as outlined in the procedure report. Post procedure RV lead impedance acutely elevated, patient developed CP, CXR with no evidence of gross lead movement, felt to have lead perforation.  She remained hemodynamically stable, brought back to the lab and underwent RV lead revision She was monitored on telemetry overnight  which demonstrated SR.  Left chest was without hematoma or ecchymosis.  The device was interrogated and found to be functioning normally.  CXR was obtained and demonstrated no pneumothorax status post device implantation.  Wound care, arm mobility, and restrictions were reviewed with the patient.  The patient feels well, denies any ongoing CP no SOB, minimal site discomfort, she was examined by Dr. Nelly Laurence and considered stable for discharge to home.   The patient's discharge medications include an ACE/ARB (losartan) and beta blocker (carvedilol).   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   HEENT: normocephalic, atraumatic; sclera clear, conjunctiva pink; hearing intact; oropharynx clear Lungs-  CTA b/l, normal work of breathing.  No wheezes, rales, rhonchi Heart- RRR, no murmurs, rubs or gallops, PMI not laterally displaced GI- soft, non-tender, non-distended Extremities- no clubbing, cyanosis, or edema MS- no significant deformity or atrophy Skin- warm and dry, no rash or lesion, left chest without hematoma/ecchymosis Psych- euthymic mood, full affect Neuro- no gross defecits  Labs:   Lab Results  Component Value Date   WBC 4.6  09/03/2023   HGB 14.6 09/03/2023   HCT 43.2 09/03/2023   MCV 92 09/03/2023   PLT 208 09/03/2023   No results for input(s): "NA", "K", "CL", "CO2", "BUN", "CREATININE", "CALCIUM", "PROT", "BILITOT", "ALKPHOS", "ALT", "AST", "GLUCOSE" in the last 168 hours.  Invalid input(s):  "LABALBU"  Discharge Medications:  Allergies as of 09/13/2023       Reactions   Effexor [venlafaxine] Other (See Comments)   Caused depression, negative thoughts, and crying    Hydrocodone-acetaminophen Itching, Nausea And Vomiting   Patient can tolerate with Zofran        Medication List     TAKE these medications    carvedilol 3.125 MG tablet Commonly known as: COREG Take 1 tablet (3.125 mg total) by mouth 2 (two) times daily.   ivabradine 5 MG Tabs tablet Commonly known as: Corlanor Take 1 tablet (5 mg total) by mouth 2 (two) times daily with a meal.   Jardiance 10 MG Tabs tablet Generic drug: empagliflozin Take 1 tablet (10 mg total) by mouth daily before breakfast.   losartan 25 MG tablet Commonly known as: COZAAR Take 1 tablet (25 mg total) by mouth daily.   mexiletine 150 MG capsule Commonly known as: MEXITIL TAKE 1 CAPSULE BY MOUTH 2 TIMES DAILY.   rosuvastatin 10 MG tablet Commonly known as: CRESTOR Take 1 tablet (10 mg total) by mouth at bedtime. What changed: when to take this   spironolactone 25 MG tablet Commonly known as: ALDACTONE Take 0.5 tablets (12.5 mg total) by mouth daily.        Disposition:  Discharge Instructions     Diet - low sodium heart healthy   Complete by: As directed    Increase activity slowly   Complete by: As directed         Duration of Discharge Encounter: 15 minutes, APP time.  Norma Fredrickson, PA-C 09/13/2023 10:29 AM

## 2023-09-13 NOTE — TOC CM/SW Note (Signed)
 Transition of Care [facility]) - Inpatient Brief Assessment   Patient Details  Name: Patricia Collier MRN: [id number] Date of Birth: 15-Jul-1945  Transition of Care [facility]) CM/SW Contact:    Gala Lewandowsky, RN Phone Number: 09/13/2023, 10:55 AM   Clinical Narrative: Patient post ICD lead revision. PTA patient was independent from home. No home needs identified at this time.    Transition of Care Asessment: Insurance and Status: Insurance coverage has been reviewed Patient has primary care physician: Yes Home environment has been reviewed: reviewed Prior level of function:: independent Prior/Current Home Services: No current home services Social Drivers of Health Review: SDOH reviewed no interventions necessary Readmission risk has been reviewed: Yes Transition of care needs: no transition of care needs at this time

## 2023-09-25 ENCOUNTER — Other Ambulatory Visit (HOSPITAL_COMMUNITY): Payer: Self-pay | Admitting: Internal Medicine

## 2023-09-25 ENCOUNTER — Other Ambulatory Visit: Payer: Self-pay

## 2023-09-25 ENCOUNTER — Other Ambulatory Visit (HOSPITAL_COMMUNITY): Payer: Self-pay

## 2023-09-25 ENCOUNTER — Telehealth: Payer: Self-pay

## 2023-09-25 MED ORDER — CARVEDILOL 3.125 MG PO TABS
3.1250 mg | ORAL_TABLET | Freq: Two times a day (BID) | ORAL | 4 refills | Status: DC
  Filled 2023-09-25: qty 60, 30d supply, fill #0
  Filled 2023-10-27: qty 60, 30d supply, fill #1
  Filled 2023-12-04: qty 60, 30d supply, fill #2
  Filled ????-??-??: fill #2

## 2023-09-25 NOTE — Telephone Encounter (Signed)
 I called the pt and got a transmission from her before her appointment tomorrow.

## 2023-09-26 ENCOUNTER — Ambulatory Visit: Attending: Cardiology

## 2023-09-26 DIAGNOSIS — I5022 Chronic systolic (congestive) heart failure: Secondary | ICD-10-CM

## 2023-09-26 DIAGNOSIS — I447 Left bundle-branch block, unspecified: Secondary | ICD-10-CM

## 2023-09-26 LAB — CUP PACEART INCLINIC DEVICE CHECK
Battery Remaining Longevity: 91 mo
Brady Statistic RA Percent Paced: 1.5 %
Brady Statistic RV Percent Paced: 99.78 %
Date Time Interrogation Session: 20250320131910
HighPow Impedance: 54 Ohm
Implantable Lead Connection Status: 753985
Implantable Lead Connection Status: 753985
Implantable Lead Connection Status: 753985
Implantable Lead Implant Date: 20250306
Implantable Lead Implant Date: 20250306
Implantable Lead Implant Date: 20250306
Implantable Lead Location: 753858
Implantable Lead Location: 753859
Implantable Lead Location: 753860
Implantable Pulse Generator Implant Date: 20250306
Lead Channel Impedance Value: 387.5 Ohm
Lead Channel Impedance Value: 500 Ohm
Lead Channel Impedance Value: 837.5 Ohm
Lead Channel Pacing Threshold Amplitude: 0.5 V
Lead Channel Pacing Threshold Amplitude: 0.5 V
Lead Channel Pacing Threshold Amplitude: 1 V
Lead Channel Pacing Threshold Amplitude: 1 V
Lead Channel Pacing Threshold Amplitude: 2 V
Lead Channel Pacing Threshold Amplitude: 2 V
Lead Channel Pacing Threshold Pulse Width: 0.5 ms
Lead Channel Pacing Threshold Pulse Width: 0.5 ms
Lead Channel Pacing Threshold Pulse Width: 0.5 ms
Lead Channel Pacing Threshold Pulse Width: 0.5 ms
Lead Channel Pacing Threshold Pulse Width: 0.6 ms
Lead Channel Pacing Threshold Pulse Width: 0.6 ms
Lead Channel Sensing Intrinsic Amplitude: 11.8 mV
Lead Channel Sensing Intrinsic Amplitude: 3.6 mV
Lead Channel Setting Pacing Amplitude: 1.5 V
Lead Channel Setting Pacing Amplitude: 1.75 V
Lead Channel Setting Pacing Amplitude: 2.5 V
Lead Channel Setting Pacing Pulse Width: 0.5 ms
Lead Channel Setting Pacing Pulse Width: 0.6 ms
Lead Channel Setting Sensing Sensitivity: 0.5 mV
Pulse Gen Serial Number: 810126124
Zone Setting Status: 755011

## 2023-09-26 NOTE — Patient Instructions (Signed)

## 2023-09-26 NOTE — Progress Notes (Signed)
 Normal Biventricular ICD wound check. Wound well healed. Presenting rhythm: AS/BP 76 . Routine testing performed. Thresholds, sensing, and impedances consistent with implant measurements with 3.5V safety margin/auto capture until 3 month visit. No treated arrhythmias. Reviewed arm restrictions to continue for 6 weeks total post op. Reviewed shock plan.  Pt enrolled in remote follow-up. Patient BiV pacing >99% of the time.

## 2023-10-02 ENCOUNTER — Encounter: Payer: Self-pay | Admitting: Cardiovascular Disease

## 2023-10-25 ENCOUNTER — Encounter

## 2023-10-29 ENCOUNTER — Ambulatory Visit

## 2023-10-29 DIAGNOSIS — I447 Left bundle-branch block, unspecified: Secondary | ICD-10-CM | POA: Diagnosis not present

## 2023-10-30 LAB — CUP PACEART REMOTE DEVICE CHECK
Battery Remaining Longevity: 91 mo
Battery Remaining Percentage: 95 %
Battery Voltage: 3.05 V
Brady Statistic AP VP Percent: 3.3 %
Brady Statistic AP VS Percent: 1 %
Brady Statistic AS VP Percent: 96 %
Brady Statistic AS VS Percent: 1 %
Brady Statistic RA Percent Paced: 3.3 %
Date Time Interrogation Session: 20250422031654
HighPow Impedance: 61 Ohm
Implantable Lead Connection Status: 753985
Implantable Lead Connection Status: 753985
Implantable Lead Connection Status: 753985
Implantable Lead Implant Date: 20250306
Implantable Lead Implant Date: 20250306
Implantable Lead Implant Date: 20250306
Implantable Lead Location: 753858
Implantable Lead Location: 753859
Implantable Lead Location: 753860
Implantable Pulse Generator Implant Date: 20250306
Lead Channel Impedance Value: 1100 Ohm
Lead Channel Impedance Value: 380 Ohm
Lead Channel Impedance Value: 400 Ohm
Lead Channel Pacing Threshold Amplitude: 0.5 V
Lead Channel Pacing Threshold Amplitude: 0.75 V
Lead Channel Pacing Threshold Amplitude: 1.75 V
Lead Channel Pacing Threshold Pulse Width: 0.5 ms
Lead Channel Pacing Threshold Pulse Width: 0.5 ms
Lead Channel Pacing Threshold Pulse Width: 0.6 ms
Lead Channel Sensing Intrinsic Amplitude: 12 mV
Lead Channel Sensing Intrinsic Amplitude: 3.5 mV
Lead Channel Setting Pacing Amplitude: 1.5 V
Lead Channel Setting Pacing Amplitude: 1.75 V
Lead Channel Setting Pacing Amplitude: 2.25 V
Lead Channel Setting Pacing Pulse Width: 0.5 ms
Lead Channel Setting Pacing Pulse Width: 0.6 ms
Lead Channel Setting Sensing Sensitivity: 0.5 mV
Pulse Gen Serial Number: 810126124
Zone Setting Status: 755011

## 2023-11-07 ENCOUNTER — Encounter: Payer: Self-pay | Admitting: Cardiovascular Disease

## 2023-12-03 ENCOUNTER — Ambulatory Visit: Admitting: Family Medicine

## 2023-12-03 NOTE — Progress Notes (Unsigned)
    [name] D.[facility] [address] [place] [address] Phone: [phone]   Assessment and Plan:     There are no diagnoses linked to this encounter.  ***   Pertinent previous records reviewed include ***    Follow Up: ***     Subjective:   I, Patricia Collier, am serving as a Neurosurgeon for Doctor Ulysees Gander  Chief Complaint: left hip pain   HPI:   12/04/2023 Patient is a 78 year old female with left hip pain. Patient states   Relevant Historical Information: ***  Additional pertinent review of systems negative.   Current Outpatient Medications:    carvedilol  (COREG ) 3.125 MG tablet, Take 1 tablet (3.125 mg total) by mouth 2 (two) times daily., Disp: 60 tablet, Rfl: 4   empagliflozin  (JARDIANCE ) 10 MG TABS tablet, Take 1 tablet (10 mg total) by mouth daily before breakfast., Disp: 90 tablet, Rfl: 3   ivabradine  (CORLANOR ) 5 MG TABS tablet, Take 1 tablet (5 mg total) by mouth 2 (two) times daily with a meal., Disp: 180 tablet, Rfl: 2   losartan  (COZAAR ) 25 MG tablet, Take 1 tablet (25 mg total) by mouth daily., Disp: 90 tablet, Rfl: 3   mexiletine (MEXITIL ) 150 MG capsule, TAKE 1 CAPSULE BY MOUTH 2 TIMES DAILY., Disp: 180 capsule, Rfl: 3   rosuvastatin  (CRESTOR ) 10 MG tablet, Take 1 tablet (10 mg total) by mouth at bedtime. (Patient taking differently: Take 10 mg by mouth in the morning.), Disp: 90 tablet, Rfl: 1   spironolactone  (ALDACTONE ) 25 MG tablet, Take 0.5 tablets (12.5 mg total) by mouth daily., Disp: 45 tablet, Rfl: 3   Objective:     There were no vitals filed for this visit.    There is no height or weight on file to calculate BMI.    Physical Exam:    ***   Electronically signed by:  Marshall Skeeter D.[facility] 7:40 AM 12/03/23

## 2023-12-04 ENCOUNTER — Other Ambulatory Visit (HOSPITAL_COMMUNITY): Payer: Self-pay

## 2023-12-04 ENCOUNTER — Ambulatory Visit (INDEPENDENT_AMBULATORY_CARE_PROVIDER_SITE_OTHER)

## 2023-12-04 ENCOUNTER — Other Ambulatory Visit (HOSPITAL_BASED_OUTPATIENT_CLINIC_OR_DEPARTMENT_OTHER): Payer: Self-pay

## 2023-12-04 ENCOUNTER — Other Ambulatory Visit: Payer: Self-pay

## 2023-12-04 ENCOUNTER — Ambulatory Visit: Admitting: Sports Medicine

## 2023-12-04 VITALS — BP 110/76 | HR 74 | Ht 63.0 in | Wt 153.0 lb

## 2023-12-04 DIAGNOSIS — M25552 Pain in left hip: Secondary | ICD-10-CM | POA: Diagnosis not present

## 2023-12-04 DIAGNOSIS — M7062 Trochanteric bursitis, left hip: Secondary | ICD-10-CM

## 2023-12-04 DIAGNOSIS — M1612 Unilateral primary osteoarthritis, left hip: Secondary | ICD-10-CM | POA: Diagnosis not present

## 2023-12-04 MED ORDER — KETOROLAC TROMETHAMINE 10 MG PO TABS
10.0000 mg | ORAL_TABLET | Freq: Three times a day (TID) | ORAL | 0 refills | Status: DC | PRN
  Filled 2023-12-04: qty 20, 7d supply, fill #0
  Filled 2024-01-13: qty 10, 4d supply, fill #1

## 2023-12-04 NOTE — Patient Instructions (Addendum)
 Glute HEP Can continue walking as tolerated but, if in a flare resort to water aerobics, elliptical, and stationary bike  Ketoralac 1 pill daily as needed limited to 1-2 per week  3 week follow up

## 2023-12-12 ENCOUNTER — Ambulatory Visit: Payer: Self-pay | Admitting: Sports Medicine

## 2023-12-12 NOTE — Addendum Note (Signed)
 Addended by: Lott Rouleau A on: 12/12/2023 02:22 PM   Modules accepted: Orders

## 2023-12-12 NOTE — Progress Notes (Signed)
 Remote ICD transmission.

## 2023-12-14 NOTE — Progress Notes (Unsigned)
 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  Admit 09/2023 for CRT-D implant.  Admit complicated by acutely elevated RV lead impedence & symptoms c/w lead perforation requiring revision.   Since last being seen in our clinic the patient reports doing very well since her device.  She notes difficulties post implant but has felt so well since.  She notes that she feels as if she goes to the bathroom all the time.  She never notices swelling or fluid accumulation.  She reports that she has had a sensation fairly regularly since implant in her left chest that feels almost like what she would compare to a baby kicking.  She denies chest pain, palpitations, dyspnea, PND, orthopnea, nausea, vomiting, dizziness, syncope, edema, weight gain, or early satiety.    Review of systems complete and found to be negative unless listed in HPI.    EP Information / Studies Reviewed:    EKG is ordered today. Personal review as below.  EKG Interpretation Date/Time:  Monday December 16 2023 11:24:12 EDT Ventricular Rate:  63 PR Interval:  146 QRS Duration:  114 QT Interval:  478 QTC Calculation: 489 R Axis:   -78  Text Interpretation: Atrial-sensed ventricular-paced rhythm Confirmed by Creighton Doffing ([id number]) on 12/16/2023 11:44:28 AM   ICD Interrogation-  reviewed in detail today,  See PACEART report.  Device History: Abbott BiV ICD implanted 09/12/23 for HFrEF, LBBB History of appropriate therapy: No History of AAD therapy: Yes; currently on Mexiletine   Studies:  ECHO 02/2023 > LVEF 35-40%, G1DD  cRMI  05/2023 > EF 31%, dyssynchrony c/w LBBB, no LGE  Arrhythmia / AAD PVC's  Mexiletine             Physical Exam:   VS:  BP 117/78   Pulse 63   Ht 5\' 3"  (1.6 m)   Wt 149 lb (67.6 kg)   SpO2 97%   BMI 26.39 kg/m    Wt Readings from Last 3 Encounters:  12/16/23 149 lb (67.6 kg)  12/04/23 153 lb (69.4 kg)  09/12/23 148 lb 4.8 oz (67.3 kg)     GEN: Well nourished, well developed in no acute distress NECK: No JVD; No carotid bruits CARDIAC: Regular rate and rhythm, no murmurs, rubs, gallops RESPIRATORY:  Clear to auscultation without rales, wheezing or rhonchi  ABDOMEN: Soft, non-tender, non-distended EXTREMITIES:  No edema; No deformity   ASSESSMENT AND PLAN:    Chronic Systolic Dysfunction due to NICM s/p Abbott CRT-D  LBBB  LV>RV by 30 ms -euvolemic on exam and by device   -Stable on an appropriate medical regimen -Normal ICD function, 99% BiV pacing  -See Pace Art report -EKG with NSR, narrow QRS > was ~ 160 ms pre implant, now 114 ms  -RV auto-capture turned off as patient was sensitive to RV only pacing, checked in clinic with high LV output and no stim noted, suspect auto checks were what she was noting  Frequent PVC's  -continue mexiletine 150 mg BID   SVT  Noted 11/06/2022  -none noted on device    Disposition:   Follow up with Dr.  Mealor in 6 months   Signed, Creighton Doffing, NP-C, AGACNP-BC [facility] - Electrophysiology  12/16/2023, 11:45 AM

## 2023-12-16 ENCOUNTER — Ambulatory Visit: Attending: Pulmonary Disease | Admitting: Pulmonary Disease

## 2023-12-16 ENCOUNTER — Encounter: Payer: Self-pay | Admitting: Pulmonary Disease

## 2023-12-16 ENCOUNTER — Other Ambulatory Visit (HOSPITAL_COMMUNITY): Payer: Self-pay

## 2023-12-16 VITALS — BP 117/78 | HR 63 | Ht 63.0 in | Wt 149.0 lb

## 2023-12-16 DIAGNOSIS — I447 Left bundle-branch block, unspecified: Secondary | ICD-10-CM | POA: Diagnosis not present

## 2023-12-16 DIAGNOSIS — I471 Supraventricular tachycardia, unspecified: Secondary | ICD-10-CM

## 2023-12-16 DIAGNOSIS — I5022 Chronic systolic (congestive) heart failure: Secondary | ICD-10-CM | POA: Diagnosis not present

## 2023-12-16 DIAGNOSIS — I493 Ventricular premature depolarization: Secondary | ICD-10-CM

## 2023-12-16 LAB — CUP PACEART INCLINIC DEVICE CHECK
Date Time Interrogation Session: 20250609165405
Implantable Lead Connection Status: 753985
Implantable Lead Connection Status: 753985
Implantable Lead Connection Status: 753985
Implantable Lead Implant Date: 20250306
Implantable Lead Implant Date: 20250306
Implantable Lead Implant Date: 20250306
Implantable Lead Location: 753858
Implantable Lead Location: 753859
Implantable Lead Location: 753860
Implantable Pulse Generator Implant Date: 20250306
Pulse Gen Serial Number: 810126124

## 2023-12-16 NOTE — Patient Instructions (Signed)
 Medication Instructions:   Your physician recommends that you continue on your current medications as directed. Please refer to the Current Medication list given to you today.   *If you need a refill on your cardiac medications before your next appointment, please call your pharmacy*   Lab Work: NONE ORDERED  TODAY    If you have labs (blood work) drawn today and your tests are completely normal, you will receive your results only by: MyChart Message (if you have MyChart) OR A paper copy in the mail If you have any lab test that is abnormal or we need to change your treatment, we will call you to review the results.   Testing/Procedures: NONE ORDERED  TODAY      Follow-Up: At [facility], you and your health needs are our priority.  As part of our continuing mission to provide you with exceptional heart care, our providers are all part of one team.  This team includes your primary Cardiologist (physician) and Advanced Practice Providers or APPs (Physician Assistants and Nurse Practitioners) who all work together to provide you with the care you need, when you need it.  Your next appointment:    6 month(s)   Provider:    You may see Efraim Grange, MD or one of the following Advanced Practice Providers on your designated Care Team:   Creighton Doffing, NP   We recommend signing up for the patient portal called "MyChart".  Sign up information is provided on this After Visit Summary.  MyChart is used to connect with patients for Virtual Visits (Telemedicine).  Patients are able to view lab/test results, encounter notes, upcoming appointments, etc.  Non-urgent messages can be sent to your provider as well.   To learn more about what you can do with MyChart, go to [email or web address].   Other Instructions

## 2023-12-17 ENCOUNTER — Ambulatory Visit: Payer: Self-pay | Admitting: Cardiovascular Disease

## 2023-12-20 ENCOUNTER — Other Ambulatory Visit (HOSPITAL_COMMUNITY): Payer: Self-pay

## 2023-12-20 ENCOUNTER — Telehealth: Payer: Self-pay | Admitting: Physician Assistant

## 2023-12-20 DIAGNOSIS — I5022 Chronic systolic (congestive) heart failure: Secondary | ICD-10-CM

## 2023-12-20 MED ORDER — SPIRONOLACTONE 25 MG PO TABS: 12.5000 mg | ORAL_TABLET | Freq: Every day | ORAL | 0 refills | Status: DC

## 2023-12-20 MED ORDER — CARVEDILOL 3.125 MG PO TABS
3.1250 mg | ORAL_TABLET | Freq: Two times a day (BID) | ORAL | 0 refills | Status: DC
Start: 2023-12-20 — End: 2024-01-14

## 2023-12-20 MED ORDER — EMPAGLIFLOZIN 10 MG PO TABS: 10.0000 mg | ORAL_TABLET | Freq: Every day | ORAL | 0 refills | Status: DC

## 2023-12-20 MED ORDER — MEXILETINE HCL 150 MG PO CAPS: ORAL_CAPSULE | Freq: Two times a day (BID) | ORAL | 0 refills | Status: DC

## 2023-12-20 MED ORDER — LOSARTAN POTASSIUM 25 MG PO TABS: 25.0000 mg | ORAL_TABLET | Freq: Every day | ORAL | 0 refills | Status: DC

## 2023-12-20 MED ORDER — IVABRADINE HCL 5 MG PO TABS
5.0000 mg | ORAL_TABLET | Freq: Two times a day (BID) | ORAL | 0 refills | Status: DC
Start: 2023-12-20 — End: 2024-01-14

## 2023-12-20 NOTE — Telephone Encounter (Signed)
 Pt went on vacation and forgot all medications. Sent in 2 days worth of all cardiac medications.She states medications will be sent via [employer] and she will have everything by tomorrow.  Lamond Pilot, PA-C 12/20/2023, 8:11 PM [phone] [facility] [address] [place], [place] [address]

## 2023-12-26 NOTE — Progress Notes (Signed)
    Ben  D.[facility] [address] [place] [address] Phone: [phone]   Assessment and Plan:     There are no diagnoses linked to this encounter.  ***   Pertinent previous records reviewed include ***    Follow Up: ***     Subjective:   I, Patricia Collier, am serving as a Neurosurgeon for Doctor Ulysees Gander   Chief Complaint: left hip pain    HPI:    12/04/2023 Patient is a 78 year old female with left hip pain. Patient states couple of months ago pain started. No MOI. She is an avid walker. Antalgic gait, isnt able to go up steps normal. 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 She is TTP to specific spot. Pain radiates down to the knee and foot on the left side. Tylenol , ibu, ice and heat, RICEs. Decreased ROM. No numbness or tingling.   12/27/2023 Patient states    Relevant Historical Information: CHF, pacemaker, history of breast cancer  Additional pertinent review of systems negative.   Current Outpatient Medications:    carvedilol  (COREG ) 3.125 MG tablet, Take 1 tablet (3.125 mg total) by mouth 2 (two) times daily., Disp: 4 tablet, Rfl: 0   empagliflozin  (JARDIANCE ) 10 MG TABS tablet, Take 1 tablet (10 mg total) by mouth daily before breakfast., Disp: 2 tablet, Rfl: 0   ivabradine  (CORLANOR ) 5 MG TABS tablet, Take 1 tablet (5 mg total) by mouth 2 (two) times daily with a meal., Disp: 4 tablet, Rfl: 0   ketorolac  (TORADOL ) 10 MG tablet, Take 1 tablet (10 mg total) by mouth every 8 (eight) hours as needed., Disp: 30 tablet, Rfl: 0   losartan  (COZAAR ) 25 MG tablet, Take 1 tablet (25 mg total) by mouth daily., Disp: 2 tablet, Rfl: 0   mexiletine (MEXITIL ) 150 MG capsule, TAKE 1 CAPSULE BY MOUTH 2 TIMES DAILY., Disp: 4 capsule, Rfl: 0   rosuvastatin  (CRESTOR ) 10 MG tablet, Take 1 tablet (10 mg total) by mouth at bedtime. (Patient taking differently: Take 10 mg by mouth in the morning.), Disp: 90 tablet, Rfl: 1    spironolactone  (ALDACTONE ) 25 MG tablet, Take 0.5 tablets (12.5 mg total) by mouth daily., Disp: 2 tablet, Rfl: 0   Objective:     There were no vitals filed for this visit.    There is no height or weight on file to calculate BMI.    Physical Exam:    ***   Electronically signed by:  Marshall Skeeter D.[facility] 7:44 AM 12/26/23

## 2023-12-27 ENCOUNTER — Ambulatory Visit

## 2023-12-27 ENCOUNTER — Ambulatory Visit (HOSPITAL_COMMUNITY)
Admission: RE | Admit: 2023-12-27 | Discharge: 2023-12-27 | Disposition: A | Discharge status: Home / Self Care | Source: Ambulatory Visit | Attending: Internal Medicine | Admitting: Internal Medicine

## 2023-12-27 ENCOUNTER — Ambulatory Visit: Admitting: Sports Medicine

## 2023-12-27 VITALS — BP 128/78 | Ht 63.0 in | Wt 149.0 lb

## 2023-12-27 DIAGNOSIS — I5022 Chronic systolic (congestive) heart failure: Secondary | ICD-10-CM | POA: Diagnosis not present

## 2023-12-27 DIAGNOSIS — M79672 Pain in left foot: Secondary | ICD-10-CM | POA: Diagnosis not present

## 2023-12-27 DIAGNOSIS — I493 Ventricular premature depolarization: Secondary | ICD-10-CM | POA: Insufficient documentation

## 2023-12-27 DIAGNOSIS — M19072 Primary osteoarthritis, left ankle and foot: Secondary | ICD-10-CM | POA: Diagnosis not present

## 2023-12-27 DIAGNOSIS — E785 Hyperlipidemia, unspecified: Secondary | ICD-10-CM | POA: Insufficient documentation

## 2023-12-27 DIAGNOSIS — M25552 Pain in left hip: Secondary | ICD-10-CM

## 2023-12-27 DIAGNOSIS — M7062 Trochanteric bursitis, left hip: Secondary | ICD-10-CM

## 2023-12-27 DIAGNOSIS — I352 Nonrheumatic aortic (valve) stenosis with insufficiency: Secondary | ICD-10-CM | POA: Diagnosis not present

## 2023-12-27 LAB — ECHOCARDIOGRAM COMPLETE
AR max vel: 1.1 cm2
AV Area VTI: 1.07 cm2
AV Area mean vel: 1.08 cm2
AV Mean grad: 11 mmHg
AV Peak grad: 19.9 mmHg
Ao pk vel: 2.23 m/s
Area-P 1/2: 4.6 cm2
Calc EF: 31.7 %
Height: 63 in
S' Lateral: 3.7 cm
Single Plane A2C EF: 38 %
Single Plane A4C EF: 29.8 %
Weight: 2384 [oz_av]

## 2023-12-27 NOTE — Progress Notes (Signed)
*  PRELIMINARY RESULTS* Echocardiogram 2D Echocardiogram has been performed.  Patricia Collier E Patricia Collier 12/27/2023, 4:00 PM

## 2023-12-27 NOTE — Patient Instructions (Signed)
 Voltaren gel over areas of pain  Foot HEP Toradol  as needed for pain  If pain does not improve call and ask for meloxicam   As needed follow up

## 2024-01-13 ENCOUNTER — Other Ambulatory Visit (HOSPITAL_COMMUNITY): Payer: Self-pay

## 2024-01-13 ENCOUNTER — Other Ambulatory Visit: Payer: Self-pay

## 2024-01-14 ENCOUNTER — Other Ambulatory Visit (HOSPITAL_COMMUNITY): Payer: Self-pay

## 2024-01-14 ENCOUNTER — Other Ambulatory Visit (HOSPITAL_COMMUNITY): Payer: Self-pay | Admitting: *Deleted

## 2024-01-14 ENCOUNTER — Other Ambulatory Visit: Payer: Self-pay | Admitting: Family Medicine

## 2024-01-14 ENCOUNTER — Other Ambulatory Visit: Payer: Self-pay

## 2024-01-14 DIAGNOSIS — I5022 Chronic systolic (congestive) heart failure: Secondary | ICD-10-CM

## 2024-01-14 DIAGNOSIS — Z1231 Encounter for screening mammogram for malignant neoplasm of breast: Secondary | ICD-10-CM

## 2024-01-14 MED ORDER — CARVEDILOL 3.125 MG PO TABS
3.1250 mg | ORAL_TABLET | Freq: Two times a day (BID) | ORAL | 3 refills | Status: AC
  Filled 2024-01-14: qty 180, 90d supply, fill #0
  Filled 2024-04-11: qty 180, 90d supply, fill #1
  Filled 2024-07-08: qty 180, 90d supply, fill #2

## 2024-01-14 MED ORDER — LOSARTAN POTASSIUM 25 MG PO TABS
25.0000 mg | ORAL_TABLET | Freq: Every day | ORAL | 3 refills | Status: AC
  Filled 2024-01-14: qty 90, 90d supply, fill #0
  Filled 2024-04-11: qty 90, 90d supply, fill #1
  Filled 2024-07-08: qty 90, 90d supply, fill #2

## 2024-01-14 MED ORDER — SPIRONOLACTONE 25 MG PO TABS
12.5000 mg | ORAL_TABLET | Freq: Every day | ORAL | 3 refills | Status: AC
  Filled 2024-01-14: qty 45, 90d supply, fill #0
  Filled 2024-04-11: qty 45, 90d supply, fill #1
  Filled 2024-07-08: qty 45, 90d supply, fill #2

## 2024-01-14 MED ORDER — MEXILETINE HCL 150 MG PO CAPS
150.0000 mg | ORAL_CAPSULE | Freq: Two times a day (BID) | ORAL | 3 refills | Status: AC
  Filled 2024-01-14: qty 180, 90d supply, fill #0
  Filled 2024-02-15 – 2024-04-14 (×3): qty 180, 90d supply, fill #1
  Filled 2024-07-08: qty 180, 90d supply, fill #2

## 2024-01-14 MED ORDER — EMPAGLIFLOZIN 10 MG PO TABS
10.0000 mg | ORAL_TABLET | Freq: Every day | ORAL | 3 refills | Status: AC
  Filled 2024-01-14: qty 90, 90d supply, fill #0
  Filled 2024-04-11: qty 90, 90d supply, fill #1
  Filled 2024-07-08: qty 90, 90d supply, fill #2

## 2024-01-14 MED ORDER — IVABRADINE HCL 5 MG PO TABS
5.0000 mg | ORAL_TABLET | Freq: Two times a day (BID) | ORAL | 3 refills | Status: AC
  Filled 2024-01-14 – 2024-02-15 (×2): qty 180, 90d supply, fill #0
  Filled 2024-06-02: qty 180, 90d supply, fill #1

## 2024-01-15 ENCOUNTER — Other Ambulatory Visit (HOSPITAL_COMMUNITY): Payer: Self-pay

## 2024-01-15 DIAGNOSIS — L2989 Other pruritus: Secondary | ICD-10-CM | POA: Diagnosis not present

## 2024-01-15 DIAGNOSIS — L282 Other prurigo: Secondary | ICD-10-CM | POA: Diagnosis not present

## 2024-01-15 DIAGNOSIS — Z79899 Other long term (current) drug therapy: Secondary | ICD-10-CM | POA: Diagnosis not present

## 2024-01-15 DIAGNOSIS — L219 Seborrheic dermatitis, unspecified: Secondary | ICD-10-CM | POA: Diagnosis not present

## 2024-01-15 MED ORDER — FLUOCINONIDE 0.05 % EX SOLN
1.0000 | Freq: Two times a day (BID) | CUTANEOUS | 1 refills | Status: DC
  Filled 2024-01-15: qty 60, 30d supply, fill #0

## 2024-01-20 ENCOUNTER — Other Ambulatory Visit (HOSPITAL_COMMUNITY): Payer: Self-pay

## 2024-01-20 ENCOUNTER — Other Ambulatory Visit: Payer: Self-pay | Admitting: Family Medicine

## 2024-01-20 MED ORDER — ROSUVASTATIN CALCIUM 10 MG PO TABS
10.0000 mg | ORAL_TABLET | Freq: Every evening | ORAL | 1 refills | Status: DC
  Filled 2024-01-20: qty 90, 90d supply, fill #0
  Filled 2024-04-22: qty 90, 90d supply, fill #1

## 2024-01-24 ENCOUNTER — Encounter

## 2024-01-28 ENCOUNTER — Ambulatory Visit

## 2024-01-28 DIAGNOSIS — I447 Left bundle-branch block, unspecified: Secondary | ICD-10-CM | POA: Diagnosis not present

## 2024-01-29 LAB — CUP PACEART REMOTE DEVICE CHECK
Battery Remaining Longevity: 78 mo
Battery Remaining Percentage: 92 %
Battery Voltage: 2.99 V
Brady Statistic AP VP Percent: 3.4 %
Brady Statistic AP VS Percent: 1 %
Brady Statistic AS VP Percent: 95 %
Brady Statistic AS VS Percent: 1 %
Brady Statistic RA Percent Paced: 3.2 %
Date Time Interrogation Session: 20250722020137
HighPow Impedance: 62 Ohm
Implantable Lead Connection Status: 753985
Implantable Lead Connection Status: 753985
Implantable Lead Connection Status: 753985
Implantable Lead Implant Date: 20250306
Implantable Lead Implant Date: 20250306
Implantable Lead Implant Date: 20250306
Implantable Lead Location: 753858
Implantable Lead Location: 753859
Implantable Lead Location: 753860
Implantable Pulse Generator Implant Date: 20250306
Lead Channel Impedance Value: 1075 Ohm
Lead Channel Impedance Value: 410 Ohm
Lead Channel Impedance Value: 410 Ohm
Lead Channel Pacing Threshold Amplitude: 0.5 V
Lead Channel Pacing Threshold Amplitude: 1 V
Lead Channel Pacing Threshold Amplitude: 2.5 V
Lead Channel Pacing Threshold Pulse Width: 0.5 ms
Lead Channel Pacing Threshold Pulse Width: 0.5 ms
Lead Channel Pacing Threshold Pulse Width: 0.6 ms
Lead Channel Sensing Intrinsic Amplitude: 11.8 mV
Lead Channel Sensing Intrinsic Amplitude: 2.5 mV
Lead Channel Setting Pacing Amplitude: 1.5 V
Lead Channel Setting Pacing Amplitude: 2.5 V
Lead Channel Setting Pacing Amplitude: 3 V
Lead Channel Setting Pacing Pulse Width: 0.5 ms
Lead Channel Setting Pacing Pulse Width: 0.6 ms
Lead Channel Setting Sensing Sensitivity: 0.5 mV
Pulse Gen Serial Number: 810126124
Zone Setting Status: 755011

## 2024-02-02 ENCOUNTER — Ambulatory Visit: Payer: Self-pay | Admitting: Cardiovascular Disease

## 2024-02-05 NOTE — Progress Notes (Unsigned)
    [name] D[facility] [address] [place] [address] Phone: [phone]   Assessment and Plan:     There are no diagnoses linked to this encounter.  ***   Pertinent previous records reviewed include ***    Follow Up: ***     Subjective:   I, Markon Jares, am serving as a Neurosurgeon for Doctor Morene Mace   Chief Complaint: left hip pain    HPI:    12/04/2023 Patient is a 78 year old female with left hip pain. Patient states couple of months ago pain started. No MOI. She is an avid walker. Antalgic gait, isnt able to go up steps normal. 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 She is TTP to specific spot. Pain radiates down to the knee and foot on the left side. Tylenol , ibu, ice and heat, RICEs. Decreased ROM. No numbness or tingling.    12/27/2023 Patient states hip is 95% better took about 2-3 days to get better. Knee is better. Left foot  pain is still there , thinks it might be a separate issue  swelling on top    02/06/2024 Patient states   Relevant Historical Information: CHF, pacemaker, history of breast cancer  Additional pertinent review of systems negative.   Current Outpatient Medications:    carvedilol  (COREG ) 3.125 MG tablet, Take 1 tablet (3.125 mg total) by mouth 2 (two) times daily., Disp: 180 tablet, Rfl: 3   empagliflozin  (JARDIANCE ) 10 MG TABS tablet, Take 1 tablet (10 mg total) by mouth daily before breakfast., Disp: 90 tablet, Rfl: 3   fluocinonide  (LIDEX ) 0.05 % external solution, Apply 1 Application topically 2 (two) times daily to scalp, Disp: 60 mL, Rfl: 1   ivabradine  (CORLANOR ) 5 MG TABS tablet, Take 1 tablet (5 mg total) by mouth 2 (two) times daily with a meal., Disp: 180 tablet, Rfl: 3   ketorolac  (TORADOL ) 10 MG tablet, Take 1 tablet (10 mg total) by mouth every 8 (eight) hours as needed., Disp: 30 tablet, Rfl: 0   losartan  (COZAAR ) 25 MG tablet, Take 1 tablet (25 mg total) by mouth daily.,  Disp: 90 tablet, Rfl: 3   mexiletine (MEXITIL ) 150 MG capsule, Take 1 capsule (150 mg total) by mouth 2 (two) times daily., Disp: 180 capsule, Rfl: 3   rosuvastatin  (CRESTOR ) 10 MG tablet, Take 1 tablet (10 mg total) by mouth at bedtime., Disp: 90 tablet, Rfl: 1   spironolactone  (ALDACTONE ) 25 MG tablet, Take 0.5 tablets (12.5 mg total) by mouth daily., Disp: 45 tablet, Rfl: 3   Objective:     There were no vitals filed for this visit.    There is no height or weight on file to calculate BMI.    Physical Exam:    ***   Electronically signed by:  Odis Mace [facility] 7:43 AM 02/05/24

## 2024-02-06 ENCOUNTER — Other Ambulatory Visit (HOSPITAL_COMMUNITY): Payer: Self-pay

## 2024-02-06 ENCOUNTER — Ambulatory Visit: Admitting: Sports Medicine

## 2024-02-06 VITALS — HR 70 | Ht 63.0 in | Wt 149.0 lb

## 2024-02-06 DIAGNOSIS — M79672 Pain in left foot: Secondary | ICD-10-CM | POA: Diagnosis not present

## 2024-02-06 MED ORDER — MELOXICAM 15 MG PO TABS
15.0000 mg | ORAL_TABLET | Freq: Every day | ORAL | 0 refills | Status: DC
  Filled 2024-02-06: qty 30, 30d supply, fill #0

## 2024-02-06 NOTE — Patient Instructions (Signed)
-   Start meloxicam  15 mg daily x2 weeks.  If still having pain after 2 weeks, complete 3rd-week of NSAID. May use remaining NSAID as needed once daily for pain control.  Do not to use additional over-the-counter NSAIDs (ibuprofen , naproxen, Advil , Aleve, etc.) while taking prescription NSAIDs.  May use Tylenol  [phone] mg 2 to 3 times a day for breakthrough pain.  Keep foot in boot at all times for the next 3 weeks   If you dont have one at home call us  and let us  know we can have you come back to get one  Gentle circles and ABC's   3-4 week follow up

## 2024-02-17 ENCOUNTER — Other Ambulatory Visit (HOSPITAL_COMMUNITY): Payer: Self-pay

## 2024-02-17 ENCOUNTER — Other Ambulatory Visit: Payer: Self-pay

## 2024-02-24 DIAGNOSIS — L821 Other seborrheic keratosis: Secondary | ICD-10-CM | POA: Diagnosis not present

## 2024-02-24 DIAGNOSIS — L578 Other skin changes due to chronic exposure to nonionizing radiation: Secondary | ICD-10-CM | POA: Diagnosis not present

## 2024-02-24 DIAGNOSIS — L282 Other prurigo: Secondary | ICD-10-CM | POA: Diagnosis not present

## 2024-02-24 DIAGNOSIS — L245 Irritant contact dermatitis due to other chemical products: Secondary | ICD-10-CM | POA: Diagnosis not present

## 2024-02-24 DIAGNOSIS — D225 Melanocytic nevi of trunk: Secondary | ICD-10-CM | POA: Diagnosis not present

## 2024-02-26 NOTE — Progress Notes (Unsigned)
    [name] D[facility] [address] [place] [address] Phone: [phone]   Assessment and Plan:     There are no diagnoses linked to this encounter.  ***   Pertinent previous records reviewed include ***    Follow Up: ***     Subjective:    Chief Complaint: ***  HPI:  12/04/2023 Patient is a 78 year old female with left hip pain. Patient states couple of months ago pain started. No MOI. She is an avid walker. Antalgic gait, isnt able to go up steps normal. 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 She is TTP to specific spot. Pain radiates down to the knee and foot on the left side. Tylenol , ibu, ice and heat, RICEs. Decreased ROM. No numbness or tingling.    12/27/2023 Patient states hip is 95% better took about 2-3 days to get better. Knee is better. Left foot  pain is still there , thinks it might be a separate issue  swelling on top    02/06/2024 Patient states foot pain is still there. Pain does not feel like arthritis. Is going through Toradol  like crazy. Sharp pain and swelling isnt able to sleep through the night   02/27/24 Patient states  Relevant Historical Information: ***  Additional pertinent review of systems negative.   Current Outpatient Medications:    carvedilol  (COREG ) 3.125 MG tablet, Take 1 tablet (3.125 mg total) by mouth 2 (two) times daily., Disp: 180 tablet, Rfl: 3   empagliflozin  (JARDIANCE ) 10 MG TABS tablet, Take 1 tablet (10 mg total) by mouth daily before breakfast., Disp: 90 tablet, Rfl: 3   fluocinonide  (LIDEX ) 0.05 % external solution, Apply 1 Application topically 2 (two) times daily to scalp, Disp: 60 mL, Rfl: 1   ivabradine  (CORLANOR ) 5 MG TABS tablet, Take 1 tablet (5 mg total) by mouth 2 (two) times daily with a meal., Disp: 180 tablet, Rfl: 3   ketorolac  (TORADOL ) 10 MG tablet, Take 1 tablet (10 mg total) by mouth every 8 (eight) hours as needed., Disp: 30 tablet, Rfl: 0   losartan   (COZAAR ) 25 MG tablet, Take 1 tablet (25 mg total) by mouth daily., Disp: 90 tablet, Rfl: 3   meloxicam  (MOBIC ) 15 MG tablet, Take 1 tablet (15 mg total) by mouth daily., Disp: 30 tablet, Rfl: 0   mexiletine (MEXITIL ) 150 MG capsule, Take 1 capsule (150 mg total) by mouth 2 (two) times daily., Disp: 180 capsule, Rfl: 3   rosuvastatin  (CRESTOR ) 10 MG tablet, Take 1 tablet (10 mg total) by mouth at bedtime., Disp: 90 tablet, Rfl: 1   spironolactone  (ALDACTONE ) 25 MG tablet, Take 0.5 tablets (12.5 mg total) by mouth daily., Disp: 45 tablet, Rfl: 3   Objective:     There were no vitals filed for this visit.    There is no height or weight on file to calculate BMI.    Physical Exam:    ***   Electronically signed by:  Odis Mace [facility] 4:48 PM 02/26/24

## 2024-02-27 ENCOUNTER — Other Ambulatory Visit (HOSPITAL_COMMUNITY): Payer: Self-pay

## 2024-02-27 ENCOUNTER — Ambulatory Visit: Admitting: Sports Medicine

## 2024-02-27 VITALS — BP 112/60 | HR 78 | Ht 63.0 in

## 2024-02-27 DIAGNOSIS — M79672 Pain in left foot: Secondary | ICD-10-CM

## 2024-02-27 MED ORDER — METHYLPREDNISOLONE 4 MG PO TBPK
ORAL_TABLET | ORAL | 0 refills | Status: DC
  Filled 2024-02-27: qty 21, 6d supply, fill #0

## 2024-02-27 NOTE — Patient Instructions (Addendum)
 Mri left foot to [facility]. Discontinue meloxicam  Start prednisone dose pack Follow up 5 days after MRI.

## 2024-03-04 ENCOUNTER — Telehealth: Payer: Self-pay | Admitting: Sports Medicine

## 2024-03-04 ENCOUNTER — Ambulatory Visit
Admission: RE | Admit: 2024-03-04 | Discharge: 2024-03-04 | Disposition: A | Discharge status: Home / Self Care | Source: Ambulatory Visit | Attending: Sports Medicine | Admitting: Sports Medicine

## 2024-03-04 DIAGNOSIS — M79672 Pain in left foot: Secondary | ICD-10-CM

## 2024-03-04 NOTE — Telephone Encounter (Signed)
 Patient called and said [facility] would not do her MRI because she has a defibrillator and she has to get it done at [facility], so she [facility]. She is also cancelling appt here for now.

## 2024-03-05 NOTE — Addendum Note (Signed)
 Addended by: GEROME ASHLEY SAUNDERS on: 03/05/2024 08:25 AM   Modules accepted: Orders

## 2024-03-12 ENCOUNTER — Ambulatory Visit: Admitting: Sports Medicine

## 2024-03-27 ENCOUNTER — Ambulatory Visit
Admission: RE | Admit: 2024-03-27 | Discharge: 2024-03-27 | Disposition: A | Discharge status: Home / Self Care | Source: Ambulatory Visit | Attending: Family Medicine | Admitting: Family Medicine

## 2024-03-27 ENCOUNTER — Ambulatory Visit (HOSPITAL_COMMUNITY)
Admission: RE | Admit: 2024-03-27 | Discharge: 2024-03-27 | Disposition: A | Discharge status: Home / Self Care | Source: Ambulatory Visit | Attending: Internal Medicine | Admitting: Internal Medicine

## 2024-03-27 VITALS — BP 130/72 | HR 70 | Wt 155.0 lb

## 2024-03-27 DIAGNOSIS — Z8679 Personal history of other diseases of the circulatory system: Secondary | ICD-10-CM | POA: Diagnosis not present

## 2024-03-27 DIAGNOSIS — I5022 Chronic systolic (congestive) heart failure: Secondary | ICD-10-CM | POA: Diagnosis not present

## 2024-03-27 DIAGNOSIS — R0609 Other forms of dyspnea: Secondary | ICD-10-CM | POA: Diagnosis not present

## 2024-03-27 DIAGNOSIS — I447 Left bundle-branch block, unspecified: Secondary | ICD-10-CM | POA: Insufficient documentation

## 2024-03-27 DIAGNOSIS — Z1231 Encounter for screening mammogram for malignant neoplasm of breast: Secondary | ICD-10-CM | POA: Diagnosis not present

## 2024-03-27 DIAGNOSIS — F4321 Adjustment disorder with depressed mood: Secondary | ICD-10-CM | POA: Insufficient documentation

## 2024-03-27 DIAGNOSIS — I428 Other cardiomyopathies: Secondary | ICD-10-CM | POA: Insufficient documentation

## 2024-03-27 DIAGNOSIS — I493 Ventricular premature depolarization: Secondary | ICD-10-CM | POA: Diagnosis not present

## 2024-03-27 DIAGNOSIS — Z87891 Personal history of nicotine dependence: Secondary | ICD-10-CM | POA: Insufficient documentation

## 2024-03-27 DIAGNOSIS — Z7984 Long term (current) use of oral hypoglycemic drugs: Secondary | ICD-10-CM | POA: Diagnosis not present

## 2024-03-27 DIAGNOSIS — I2510 Atherosclerotic heart disease of native coronary artery without angina pectoris: Secondary | ICD-10-CM | POA: Diagnosis not present

## 2024-03-27 DIAGNOSIS — Z79899 Other long term (current) drug therapy: Secondary | ICD-10-CM | POA: Insufficient documentation

## 2024-03-27 DIAGNOSIS — I110 Hypertensive heart disease with heart failure: Secondary | ICD-10-CM | POA: Diagnosis not present

## 2024-03-27 DIAGNOSIS — E785 Hyperlipidemia, unspecified: Secondary | ICD-10-CM | POA: Insufficient documentation

## 2024-03-27 DIAGNOSIS — I350 Nonrheumatic aortic (valve) stenosis: Secondary | ICD-10-CM | POA: Diagnosis not present

## 2024-03-27 NOTE — CV Procedure (Signed)
  Device system confirmed to be MRI conditional, with implant date > 6 weeks ago, and no evidence of abandoned or epicardial leads in review of most recent CXR  Device last cleared by EP Provider: Prentice Passey 03/26/24  Clearance is good through for 1 year as long as parameters remain stable at time of check. If pt undergoes a cardiac device procedure during that time, they should be re-cleared.   Tachy-therapies to be programmed off if applicable with device back to pre-MRI settings after completion of exam.  [facility] [facility] [employer] will be present for programming for the MRI.   Rocky Catalan, RT  03/27/2024 9:39 AM

## 2024-03-27 NOTE — Progress Notes (Signed)
 Advanced Heart Failure Clinic Note   Primary Cardiologist: Dr. Cherrie   HPI: Patricia Collier is a 78 y.o. 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 T1No Grade III invasive ductal carcinoma and received dose-dense doxorubicin in 2009 and cyclophosphamide x4, paclitaxel x12  Admitted 7/18 with new onset acute systolic CHF. EF 15-20%. Right and left heart cath done and showed  moderate non-obstructive CAD Fick output/index 3.1/1.8.  Holter monitor 8/18: Multiform PVCs, couplets, and triplets were noted with periods of bigeminy. PVC burden < 1%.No VT observed.  Echo 9/19: EF 45-50% moderate AS mean gradient Echo 8/21: EF 50-55% mild to moderate AS mean  Zio 11/21: SR 72. 1.3% PVCs. [name]   Echo  08/21/21: EF 45-50% (EF 40-45%) with LBBB dyssynchrony. Mild-mod AS mean gradient 13 AVA 1.01 cm2   Admitted 10/2022 with SVT.  Terminated with adenosine .  Underwent echo EF 35 to 40%.  Troponins elevated so underwent left/right heart cath, minimal CAD.  Plan to continue GDMT titration.  Echo 02/27/23  EF 35-40% (I thought 30-35%) with regional WMA and moderate AS  cMRI 11/24:  LVER 31% Septal-lateral dyssynchrony consistent with LBBB. No LGE. Normal RV size and systolic function, EF 50%.  Underwent Abbott Bi-ICD 3/25  Here for routine f/u. Very depressed about her home situation (husband is homebound and sleeps 20+ hr/day). Fatigued. Mild DOE. No edema.   ICD interrogation: Fluid was up but now ok.No VT/AF. > 99%biv pacing Personally reviewed     Cardiac studies:  4/24  Echo EF 35-40% and mod AS (Mean gradient 18 mm Hg. AVA by continuity  equation 0.79. DVI 0.25).  4/24 R/LHC which demonstrated minimal CAD (1st Mrg lesion 30% stenosed). RHC ok, RA: 1 mmHg, RV: 19/3 mmHg, PA: 22/10, Cardiac output is 4.24 with a cardiac index of 2.57.   Moderate aortic stenosis with peak to peak gradient of 11 mmHg, mean gradient of 19 mmHg and valve area of 1.1 cm  Zio 11/21 1. Sinus rhythm - avg HR of 72 2. Isolated PACs were rare 3. Occasional PVCs  (1.3%, 17991) 4. No high-grade arrhythmias 5. Patient triggered events associated with isolated PVCs.  CPX 01/2018 Peak VO2: 21.5 (108% predicted peak VO2) VE/VCO2 slope:  48 OUES: 1.84 Peak RER: 0.93 Echo 05/09/17 EF ~30% RV ok. Personally reviewed Echo 02/05/17 LVEF 15-20%, Grade 1 DD, Mild/Mod central MR, Mild LAE R/LHC 02/06/17 There is severe left ventricular systolic dysfunction. Dist Cx lesion, 20 %stenosed. 1st Mrg lesion, 60 %stenosed. Dist RCA lesion, 20 %stenosed. Mid LAD to Dist LAD lesion, 20 %stenosed.  Cardiac MRI 06/2017 1.  Moderate LVE with diffuse hypokinesis EF 35% 2. Small area of apical hyperenhancement on delayed gadolinium images 3.  Mild MR 4.  Mild LAE 5. Thickened tri leaflet AV with restricted leaflet motion suggest echo for AS correlation 6.  Normal RV size and function 7.  Normal aortic root 3.0 cm  Review of systems complete and found to be negative unless listed in HPI.   Past Medical History:  Diagnosis Date   Arthritis    ostearthritis -joints, greater left knee.   Breast cancer [facility])    Breast cancer, left breast (HCC) 2009   S/P surgery, radiation, chemotherapy.-no further oncology visits.   CHF (congestive heart failure) (HCC) dx'd 02/05/2017   Hyperlipidemia    Hypertension  Neuromuscular disorder (HCC)    neuropathy due to chemotherapy   Personal history of chemotherapy 2009   Personal history of radiation therapy 2009   Seizures (HCC)    childbirth related   Sleep apnea    mild no C-PAP   SVT (supraventricular tachycardia) (HCC)    x1 episode -3 yrs ago- no issues since.All heart testing negative. No further cardiac visits needed.   Current Outpatient Medications  Medication Sig Dispense Refill   carvedilol  (COREG ) 3.125 MG  tablet Take 1 tablet (3.125 mg total) by mouth 2 (two) times daily. 180 tablet 3   empagliflozin  (JARDIANCE ) 10 MG TABS tablet Take 1 tablet (10 mg total) by mouth daily before breakfast. 90 tablet 3   ivabradine  (CORLANOR ) 5 MG TABS tablet Take 1 tablet (5 mg total) by mouth 2 (two) times daily with a meal. 180 tablet 3   ketorolac  (TORADOL ) 10 MG tablet Take 1 tablet (10 mg total) by mouth every 8 (eight) hours as needed. 30 tablet 0   losartan  (COZAAR ) 25 MG tablet Take 1 tablet (25 mg total) by mouth daily. 90 tablet 3   meloxicam  (MOBIC ) 15 MG tablet Take 1 tablet (15 mg total) by mouth daily. 30 tablet 0   mexiletine (MEXITIL ) 150 MG capsule Take 1 capsule (150 mg total) by mouth 2 (two) times daily. 180 capsule 3   rosuvastatin  (CRESTOR ) 10 MG tablet Take 1 tablet (10 mg total) by mouth at bedtime. 90 tablet 1   spironolactone  (ALDACTONE ) 25 MG tablet Take 0.5 tablets (12.5 mg total) by mouth daily. 45 tablet 3   No current facility-administered medications for this encounter.   Allergies  Allergen Reactions   Effexor [Venlafaxine] Other (See Comments)    Caused depression, negative thoughts, and crying    Hydrocodone-Acetaminophen  Itching and Nausea And Vomiting    Patient can tolerate with Zofran     Social History   Socioeconomic History   Marital status: Married    Spouse name: Not on file   Number of children: 2   Years of education: Not on file   Highest education level: Not on file  Occupational History   Occupation: anesthetist    Employer: [employer]    Comment: retired June 2019  Tobacco Use   Smoking status: Former    Current packs/day: 0.00    Average packs/day: 2.0 packs/day for 10.0 years (20.0 ttl pk-yrs)    Types: Cigarettes    Start date: 07/10/1979    Quit date: 07/09/1989    Years since quitting: 34.7   Smokeless tobacco: Never  Vaping Use   Vaping status: Never Used  Substance and Sexual Activity   Alcohol use: No   Drug use: No   Sexual activity:  Yes  Other Topics Concern   Not on file  Social History Narrative   Patient with caregiver stress due to spouses declining health   Some conflicts with children and addictions (coping well)    Social Drivers of Health   Financial Resource Strain: Low Risk  (01/23/2023)   Overall Financial Resource Strain (CARDIA)    Difficulty of Paying Living Expenses: Not hard at all  Food Insecurity: No Food Insecurity (09/12/2023)   Hunger Vital Sign    Worried About Running Out of Food in the Last Year: Never true    Ran Out of Food in the Last Year: Never true  Transportation Needs: No Transportation Needs (09/12/2023)   PRAPARE - Administrator, Civil Service (Medical): No  Lack of Transportation (Non-Medical): No  Physical Activity: Sufficiently Active (01/23/2023)   Exercise Vital Sign    Days of Exercise per Week: 5 days    Minutes of Exercise per Session: 30 min  Stress: Stress Concern Present (01/23/2023)   [employer] of Occupational Health - Occupational Stress Questionnaire    Feeling of Stress : Rather much  Social Connections: Moderately Integrated (09/12/2023)   Social Connection and Isolation Panel    Frequency of Communication with Friends and Family: More than three times a week    Frequency of Social Gatherings with Friends and Family: Twice a week    Attends Religious Services: Never    Database administrator or Organizations: Yes    Attends Engineer, structural: More than 4 times per year    Marital Status: Married  Catering manager Violence: Not At Risk (09/12/2023)   Humiliation, Afraid, Rape, and Kick questionnaire    Fear of Current or Ex-Partner: No    Emotionally Abused: No    Physically Abused: No    Sexually Abused: No   Family History  Problem Relation Age of Onset   Heart disease Mother    Hypertension Father    Breast cancer Sister    Lung cancer Sister    Skin cancer Brother    Vitals:   03/27/24 1003  BP: 130/72  Pulse: 70   SpO2: 99%  Weight: 70.3 kg (155 lb)    Wt Readings from Last 3 Encounters:  03/27/24 70.3 kg (155 lb)  02/06/24 67.6 kg (149 lb)  12/27/23 67.6 kg (149 lb)    PHYSICAL EXAM: General:  Well appearing. No resp difficulty HEENT: normal Neck: supple. no JVD. Carotids 2+ bilat; no bruits. No lymphadenopathy or thryomegaly appreciated. Cor: PMI nondisplaced. Regular rate & rhythm. 2/6 SEM s2 depressed Lungs: clear Abdomen: soft, nontender, nondistended. No hepatosplenomegaly. No bruits or masses. Good bowel sounds. Extremities: no cyanosis, clubbing, rash, edema Neuro: alert & orientedx3, cranial nerves grossly intact. moves all 4 extremities w/o difficulty. Affect pleasant   ASSESSMENT & PLAN:  1. Chronic systolic CHF: NICM, EF 15-20%, felt to be related to doxorubicin therapy. But also with frequent PVC's, holter monitor showed 1% PVC's but patient complained of very frequent PVC's.  - Cath 8/18 mild non-obs CAD. LAD 20%, OM-1 60% RCA 20% - cMRI 12/18 EF 35% minimal LGE  - Echo 05/2017 ~30-35%. LV size - Echo 03/09/18 EF 45-50% - CPX 01/2018 Peak VO2: 21.5 (108% predicted peak VO2) VE/VCO2 slope:  48 Peak RER: 0.93 - Echo 10/20 EF 40-45% mild to moderate AS - Echo 8/21 EF 50-55% - Echo  08/21/21: EF 45-50% (I thought EF 40-45%) with LBBB dyssynchrony. Mild-mod AS mean gradient 13 AVA 1.01 cm2  - Echo 4/24 EF 35-40%, LBBB dyssynchrony, at least moderate low flow low gradient AS  - Cath 4/24 minimal CAD - Echo  02/27/23  EF 35-40% (I thought 30-35%) with regional WMA and moderate LFLG AS  - cMRI 11/24:  LVEF 31% Septal-lateral dyssynchrony consistent with LBBB. No LGE. Normal RVEF 50% - Abbott Bi-ICD 3/25 - Echo 6/25 EF 35-40% moderate AS - Stable NYHA II-III. Volume ok - Continue Spiro 12.5 mg daily. Did not tolerate higher dose. - Continue Losartan  25 daily BP has been too low for Entresto - we discussed increasing losartan  to 50 but wants to hold off - Continue carvedilol  to 3.125  mg BID (couldn't tolerate 6.25 bid) - Continue Jardiance  10mg  daily having episodes of hypoglycemia -  Continue ivabradine  - Hopefully EF will improve with CRT  2. Palpitations/Frequent PVCs - Holter monitor 03/2017 with PVC burden <1%. - Zio 9/21 PVCs 1.3% (monomorphic) - PVCs suppressed with mexilitene 150 bid - No change  3. Aortic stenosis - echo 10/20 AoV mean 15 AVA 0.99 (mild to moderate AS) - echo 8/21 Mild to moderate aortic valve stenosis. AVA 1.48 cm. Aortic valve mean gradient measures 18 mmHg. - Echo  08/21/21: EF 45-50% (I thought EF 40-45%) with LBBB dyssynchrony. Mild-mod AS mean gradient 13 AVA 1.01 cm2  -Echo 4/24 EF 35-40%. At least moderate low flow low gradient AS with mean gradient of 18 mmHg, AVA by continuity equation 0.79 and DVI 0.25.  - Echo  02/27/23  EF 35-40% (I thought 30-35%) with regional WMA and moderate AS (LFLG) - Echo 6/25 EF 35-40% mod AS (LFLG) mean grad 11 AVA 1.07cmw - Will likely need TAVR down the road. Follow with q6 month echos  4. Hyperlipidemia - now on Crestor  - followed by PCP  5. Hx of SVT 4/24 - Terminated with adenosine  previously - NSR today  6. Situational depression - discussed SSRI +/- counseling  Toribio Fuel, MD  10:28 AM  Advanced Heart Failure Clinic [facility] [address] [facility] [place] [address] [phone] (office) [phone] (fax)

## 2024-03-27 NOTE — Patient Instructions (Signed)
   Follow-Up in: 6 MONTHS WITH AN ECHO PLEASE CALL OUR OFFICE AROUND JANUARY  TO GET SCHEDULED FOR YOUR APPOINTMENT. PHONE NUMBER IS [phone] OPTION 2    At the Advanced [facility] Failure Clinic, you and your health needs are our priority. We have a designated team specialized in the treatment of Heart Failure. This Care Team includes your primary Heart Failure Specialized Cardiologist (physician), Advanced Practice Providers (APPs- Physician Assistants and Nurse Practitioners), and Pharmacist who all work together to provide you with the care you need, when you need it.   You may see any of the following providers on your designated Care Team at your next follow up:  Dr. Toribio Fuel Dr. Ezra Shuck Dr. Ria Commander Dr. Odis Brownie Greig Mosses, NP Caffie Shed, [place] [facility] [place], [place] Beckey Coe, NP [place] Lee, NP Tinnie Redman, PharmD   Please be sure to bring in all your medications bottles to every appointment.   Need to Contact Us :  If you have any questions or concerns before your next appointment please send us  a message through [place] or call our office at [phone].    TO LEAVE A MESSAGE FOR THE NURSE SELECT OPTION 2, PLEASE LEAVE A MESSAGE INCLUDING: YOUR NAME DATE OF BIRTH CALL BACK NUMBER REASON FOR CALL**this is important as we prioritize the call backs  YOU WILL RECEIVE A CALL BACK THE SAME DAY AS LONG AS YOU CALL BEFORE 4:00 PM

## 2024-03-27 NOTE — Addendum Note (Signed)
 Encounter addended by: Asianae Minkler B, RN on: 03/27/2024 10:48 AM  Actions taken: Order list changed, Diagnosis association updated, Clinical Note Signed

## 2024-03-31 ENCOUNTER — Encounter: Payer: Self-pay | Admitting: Sports Medicine

## 2024-03-31 ENCOUNTER — Ambulatory Visit (HOSPITAL_COMMUNITY)
Admission: RE | Admit: 2024-03-31 | Discharge: 2024-03-31 | Disposition: A | Discharge status: Home / Self Care | Source: Ambulatory Visit | Attending: Sports Medicine | Admitting: Sports Medicine

## 2024-03-31 ENCOUNTER — Encounter (HOSPITAL_COMMUNITY): Payer: Self-pay

## 2024-03-31 ENCOUNTER — Encounter: Payer: Self-pay | Admitting: Cardiovascular Disease

## 2024-03-31 DIAGNOSIS — M79672 Pain in left foot: Secondary | ICD-10-CM

## 2024-04-08 ENCOUNTER — Encounter: Payer: Self-pay | Admitting: Family Medicine

## 2024-04-08 ENCOUNTER — Ambulatory Visit: Admitting: Sports Medicine

## 2024-04-08 ENCOUNTER — Ambulatory Visit: Admitting: Family Medicine

## 2024-04-08 ENCOUNTER — Other Ambulatory Visit (HOSPITAL_COMMUNITY): Payer: Self-pay

## 2024-04-08 VITALS — BP 116/74 | HR 75 | Temp 97.9°F | Wt 153.0 lb

## 2024-04-08 DIAGNOSIS — F329 Major depressive disorder, single episode, unspecified: Secondary | ICD-10-CM | POA: Diagnosis not present

## 2024-04-08 DIAGNOSIS — Z23 Encounter for immunization: Secondary | ICD-10-CM

## 2024-04-08 DIAGNOSIS — F321 Major depressive disorder, single episode, moderate: Secondary | ICD-10-CM | POA: Diagnosis not present

## 2024-04-08 DIAGNOSIS — Z636 Dependent relative needing care at home: Secondary | ICD-10-CM | POA: Diagnosis not present

## 2024-04-08 MED ORDER — FLUOXETINE HCL 10 MG PO CAPS
10.0000 mg | ORAL_CAPSULE | Freq: Every day | ORAL | 3 refills | Status: DC
  Filled 2024-04-08: qty 30, 30d supply, fill #0

## 2024-04-08 NOTE — Progress Notes (Signed)
   Subjective:    Patient ID: Patricia Collier, female    DOB: May 27, 1946, 78 y.o.   MRN: [id number]  HPI Depression- pt is only caregiver for homebound husband (bladder cancer).  Husband was able to get help at home thru the [place] but he refused to let them in.  Pt is overwhelmed by the idea that there is 'no endpoint'.  Pt reports decreased motivation, low energy.  Has anger, sadness.  'i just want to run away'.  Pt reports emotional eating.  Has guilt about feeling trapped.     Review of Systems For ROS see HPI     Objective:   Physical Exam Vitals reviewed.  Constitutional:      General: She is not in acute distress.    Appearance: Normal appearance. She is not ill-appearing.  HENT:     Head: Normocephalic and atraumatic.  Cardiovascular:     Rate and Rhythm: Normal rate.  Pulmonary:     Effort: Pulmonary effort is normal. No respiratory distress.     Breath sounds: No wheezing.  Skin:    General: Skin is warm and dry.  Neurological:     General: No focal deficit present.     Mental Status: She is alert and oriented to person, place, and time.  Psychiatric:     Comments: Tearful, anxious, overwhelmed           Assessment & Plan:

## 2024-04-08 NOTE — Patient Instructions (Signed)
 Follow up in 1 month to recheck mood START the Fluoxetine once daily We'll call you to schedule your counseling appt I'm so proud of you for reaching out- you deserve better!! Call with any questions or concerns Hang in there!!

## 2024-04-10 NOTE — Progress Notes (Signed)
 Remote ICD Transmission

## 2024-04-13 ENCOUNTER — Encounter: Payer: Self-pay | Admitting: Internal Medicine

## 2024-04-13 ENCOUNTER — Other Ambulatory Visit: Payer: Self-pay

## 2024-04-14 ENCOUNTER — Other Ambulatory Visit (HOSPITAL_COMMUNITY): Payer: Self-pay

## 2024-04-23 NOTE — CV Procedure (Signed)
  Device system confirmed to be MRI conditional, with implant date > 6 weeks ago, and no evidence of abandoned or epicardial leads in review of most recent CXR  Device last cleared by EP Provider: Charlies Arthur 04/23/24  Clearance is good through for 1 year as long as parameters remain stable at time of check. If pt undergoes a cardiac device procedure during that time, they should be re-cleared.   Tachy-therapies to be programmed off if applicable with device back to pre-MRI settings after completion of exam.  [facility] will be present for programming for the MRI.   Rocky Catalan, RT  04/23/2024 9:21 AM

## 2024-04-24 ENCOUNTER — Encounter

## 2024-04-28 ENCOUNTER — Ambulatory Visit

## 2024-04-28 DIAGNOSIS — I5022 Chronic systolic (congestive) heart failure: Secondary | ICD-10-CM

## 2024-04-29 LAB — CUP PACEART REMOTE DEVICE CHECK
Battery Remaining Longevity: 84 mo
Battery Remaining Percentage: 89 %
Battery Voltage: 2.99 V
Brady Statistic AP VP Percent: 3.5 %
Brady Statistic AP VS Percent: 1 %
Brady Statistic AS VP Percent: 96 %
Brady Statistic AS VS Percent: 1 %
Brady Statistic RA Percent Paced: 3.4 %
Date Time Interrogation Session: 20251021020906
HighPow Impedance: 65 Ohm
Implantable Lead Connection Status: 753985
Implantable Lead Connection Status: 753985
Implantable Lead Connection Status: 753985
Implantable Lead Implant Date: 20250306
Implantable Lead Implant Date: 20250306
Implantable Lead Implant Date: 20250306
Implantable Lead Location: 753858
Implantable Lead Location: 753859
Implantable Lead Location: 753860
Implantable Pulse Generator Implant Date: 20250306
Lead Channel Impedance Value: 1275 Ohm
Lead Channel Impedance Value: 410 Ohm
Lead Channel Impedance Value: 410 Ohm
Lead Channel Pacing Threshold Amplitude: 0.5 V
Lead Channel Pacing Threshold Amplitude: 1 V
Lead Channel Pacing Threshold Amplitude: 1.75 V
Lead Channel Pacing Threshold Pulse Width: 0.5 ms
Lead Channel Pacing Threshold Pulse Width: 0.5 ms
Lead Channel Pacing Threshold Pulse Width: 1 ms
Lead Channel Sensing Intrinsic Amplitude: 11.8 mV
Lead Channel Sensing Intrinsic Amplitude: 5 mV
Lead Channel Setting Pacing Amplitude: 1.5 V
Lead Channel Setting Pacing Amplitude: 2.5 V
Lead Channel Setting Pacing Amplitude: 2.5 V
Lead Channel Setting Pacing Pulse Width: 0.5 ms
Lead Channel Setting Pacing Pulse Width: 0.5 ms
Lead Channel Setting Sensing Sensitivity: 0.5 mV
Pulse Gen Serial Number: 810126124
Zone Setting Status: 755011

## 2024-05-01 ENCOUNTER — Ambulatory Visit (HOSPITAL_COMMUNITY)
Admission: RE | Admit: 2024-05-01 | Discharge: 2024-05-01 | Disposition: A | Discharge status: Home / Self Care | Source: Ambulatory Visit | Attending: Sports Medicine | Admitting: Sports Medicine

## 2024-05-01 DIAGNOSIS — M19072 Primary osteoarthritis, left ankle and foot: Secondary | ICD-10-CM | POA: Diagnosis not present

## 2024-05-01 DIAGNOSIS — M79672 Pain in left foot: Secondary | ICD-10-CM | POA: Diagnosis not present

## 2024-05-01 NOTE — Progress Notes (Signed)
 Patient was monitored by this RN during MRI scan due to presence of a pacemaker. Cardiac rhythm was continuously monitored throughout the procedure. Prior to the start of the scan, the pacemaker was placed in MRI-safe mode by the pacemaker representative. Following the completion of the scan, the device was returned to its pre-MRI settings. Neurological status and orientation post-procedure were unchanged from baseline.   Pre-procedure Heart Rate (Prior to being placed in MRI safe mode): 77 Post-procedure Heart Rate (Once pacemaker is returned to baseline mode): 83

## 2024-05-01 NOTE — Progress Notes (Signed)
 Remote ICD Transmission

## 2024-05-04 ENCOUNTER — Ambulatory Visit (INDEPENDENT_AMBULATORY_CARE_PROVIDER_SITE_OTHER): Admitting: Psychology

## 2024-05-04 DIAGNOSIS — F321 Major depressive disorder, single episode, moderate: Secondary | ICD-10-CM | POA: Diagnosis not present
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
## 2024-05-06 ENCOUNTER — Other Ambulatory Visit: Payer: Self-pay

## 2024-05-06 ENCOUNTER — Ambulatory Visit (INDEPENDENT_AMBULATORY_CARE_PROVIDER_SITE_OTHER): Admitting: Family Medicine

## 2024-05-06 ENCOUNTER — Encounter: Payer: Self-pay | Admitting: Family Medicine

## 2024-05-06 ENCOUNTER — Other Ambulatory Visit (HOSPITAL_COMMUNITY): Payer: Self-pay

## 2024-05-06 VITALS — BP 94/62 | HR 81 | Temp 97.8°F | Resp 12 | Ht 63.0 in | Wt 157.8 lb

## 2024-05-06 DIAGNOSIS — K8000 Calculus of gallbladder with acute cholecystitis without obstruction: Secondary | ICD-10-CM | POA: Insufficient documentation

## 2024-05-06 DIAGNOSIS — F321 Major depressive disorder, single episode, moderate: Secondary | ICD-10-CM | POA: Diagnosis not present

## 2024-05-06 DIAGNOSIS — K5730 Diverticulosis of large intestine without perforation or abscess without bleeding: Secondary | ICD-10-CM | POA: Insufficient documentation

## 2024-05-06 DIAGNOSIS — I4949 Other premature depolarization: Secondary | ICD-10-CM | POA: Insufficient documentation

## 2024-05-06 DIAGNOSIS — E7800 Pure hypercholesterolemia, unspecified: Secondary | ICD-10-CM | POA: Insufficient documentation

## 2024-05-06 DIAGNOSIS — K219 Gastro-esophageal reflux disease without esophagitis: Secondary | ICD-10-CM | POA: Insufficient documentation

## 2024-05-06 DIAGNOSIS — K5900 Constipation, unspecified: Secondary | ICD-10-CM | POA: Insufficient documentation

## 2024-05-06 DIAGNOSIS — K5732 Diverticulitis of large intestine without perforation or abscess without bleeding: Secondary | ICD-10-CM | POA: Insufficient documentation

## 2024-05-06 DIAGNOSIS — F32A Depression, unspecified: Secondary | ICD-10-CM | POA: Insufficient documentation

## 2024-05-06 MED ORDER — FLUOXETINE HCL 20 MG PO CAPS
20.0000 mg | ORAL_CAPSULE | Freq: Every day | ORAL | 3 refills | Status: AC
  Filled 2024-05-06: qty 30, 30d supply, fill #0
  Filled 2024-06-02: qty 30, 30d supply, fill #1
  Filled 2024-07-02: qty 30, 30d supply, fill #2
  Filled 2024-07-30: qty 30, 30d supply, fill #3

## 2024-05-06 NOTE — Assessment & Plan Note (Signed)
 Improving.  Pt is no longer tearful or crying all the time since starting Fluoxetine 10mg  daily but does find that she is still very angry.  Has started counseling.  Applauded her efforts.  Will increase Fluoxetine to 20mg  daily and monitor for improvement.  Pt expressed understanding and is in agreement w/ plan.

## 2024-05-06 NOTE — Progress Notes (Signed)
   Subjective:    Patient ID: Patricia Collier, female    DOB: 1946/03/20, 78 y.o.   MRN: [id number]  HPI Depression- at last visit we started Fluoxetine 10mg  daily.   Pt reports doing somewhat better.  No longer as tearful.  No side effects.  'still not in the best place yet'.  Did start counseling.  Continues to be angry.   Review of Systems For ROS see HPI     Objective:   Physical Exam Vitals reviewed.  Constitutional:      General: She is not in acute distress.    Appearance: Normal appearance. She is not ill-appearing.  HENT:     Head: Normocephalic and atraumatic.  Eyes:     Extraocular Movements: Extraocular movements intact.     Conjunctiva/sclera: Conjunctivae normal.  Skin:    General: Skin is warm and dry.  Neurological:     General: No focal deficit present.     Mental Status: She is alert and oriented to person, place, and time.  Psychiatric:        Mood and Affect: Mood normal.        Behavior: Behavior normal.        Thought Content: Thought content normal.           Assessment & Plan:

## 2024-05-06 NOTE — Patient Instructions (Signed)
 Follow up in 6 weeks to recheck mood INCREASE the Fluoxetine to 20mg  daily- 2 of what you have at home, 1 of the new prescription Continue to set boundaries and take time for yourself when possible Call with any questions or concerns Hang in there!!

## 2024-05-07 NOTE — Assessment & Plan Note (Signed)
 New. Due to caregiver stress (see above).  Feels overwhelmed and trapped.  Decreased motivation, low energy.  Having both anger and sadness.  Will start low dose fluoxetine and refer for counseling.  Encouraged pt to ask for help or use resources that would allow her to get away as needed.  Will follow.

## 2024-05-07 NOTE — Assessment & Plan Note (Signed)
 New.  Husband has bladder cancer and is now homebound.  Now she is also feeling trapped at home as his only caregiver.  She is open to idea of counseling and medication.  Referral made for counseling and will start Fluoxetine 10mg  daily and titrate prn.  Pt expressed understanding and is in agreement w/ plan.

## 2024-05-11 NOTE — Progress Notes (Signed)
 Ben Rhilyn Battle D.[facility] [address] [place] [address] Phone: [phone]   Assessment and Plan:     1. Left foot pain (Primary) 2. Osteochondral lesion -Chronic with exacerbation, subsequent visit - Continued significant left foot pain consistent with pain resulting from multiple findings on MRI - Discussed left foot MRI from 05/01/2024.  Imaging showed degenerative osteochondral lesions at distal cuneiform, lateral cuneiform, distal navicular.  Additional findings of severe atrophy of extensor digitorum brevis muscle and interosseous muscles.  Additional finding of degenerative changes at first MTP, however this area is not symptomatic - Patient has had no relief despite prolonged rest, boot x 6 weeks, prednisone Dosepak, topical Voltaren gel, HEP - Discussed additional treatment options which could include physical therapy, nonweightbearing, surgical referral.  Patient is primary caregiver for her husband and does not believe that she could pursue physical therapy or nonweightbearing status at this time.  We will refer to orthopedic surgery for further discussion of treatment options    Pertinent previous records reviewed include left foot MRI   Follow Up: As needed to discuss foot pain or other musculoskeletal concerns   Subjective:   I, Patricia Collier, am serving as a neurosurgeon for Doctor Morene Mace  Chief Complaint: Continued left foot pain   HPI:  12/04/2023 Patient is a 78 year old female with left hip pain. Patient states couple of months ago pain started. No MOI. She is an avid walker. Antalgic gait, isnt able to go up steps normal. 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 She is TTP to specific spot. Pain radiates down to the knee and foot on the left side. Tylenol , ibu, ice and heat, RICEs. Decreased ROM. No numbness or tingling.    12/27/2023 Patient states hip is 95% better took about 2-3 days to get better. Knee is  better. Left foot  pain is still there , thinks it might be a separate issue  swelling on top    02/06/2024 Patient states foot pain is still there. Pain does not feel like arthritis. Is going through Toradol  like crazy. Sharp pain and swelling isnt able to sleep through the night    02/27/24 Patient states it depends on if you touch it whether it hurts. The boot isn't helping. The pain is intermittent. Top of foot on the toes it is painful to the touch. If she turns her foot inwards, it hurts. Doing the ABC exercises.    05/18/2024 Patient states that she is still having pain.    Relevant Historical Information: CHF, pacemaker, history of breast cancer    Additional pertinent review of systems negative.   Current Outpatient Medications:    carvedilol  (COREG ) 3.125 MG tablet, Take 1 tablet (3.125 mg total) by mouth 2 (two) times daily., Disp: 180 tablet, Rfl: 3   empagliflozin  (JARDIANCE ) 10 MG TABS tablet, Take 1 tablet (10 mg total) by mouth daily before breakfast., Disp: 90 tablet, Rfl: 3   FLUoxetine (PROZAC) 20 MG capsule, Take 1 capsule (20 mg total) by mouth daily., Disp: 30 capsule, Rfl: 3   ivabradine  (CORLANOR ) 5 MG TABS tablet, Take 1 tablet (5 mg total) by mouth 2 (two) times daily with a meal., Disp: 180 tablet, Rfl: 3   ketorolac  (TORADOL ) 10 MG tablet, Take 1 tablet (10 mg total) by mouth every 8 (eight) hours as needed., Disp: 30 tablet, Rfl: 0   losartan  (COZAAR ) 25 MG tablet, Take 1 tablet (25 mg total) by mouth  daily., Disp: 90 tablet, Rfl: 3   mexiletine (MEXITIL ) 150 MG capsule, Take 1 capsule (150 mg total) by mouth 2 (two) times daily., Disp: 180 capsule, Rfl: 3   rosuvastatin  (CRESTOR ) 10 MG tablet, Take 1 tablet (10 mg total) by mouth at bedtime., Disp: 90 tablet, Rfl: 1   spironolactone  (ALDACTONE ) 25 MG tablet, Take 0.5 tablets (12.5 mg total) by mouth daily., Disp: 45 tablet, Rfl: 3   meloxicam  (MOBIC ) 15 MG tablet, Take 1 tablet (15 mg total) by mouth daily.,  Disp: 30 tablet, Rfl: 0   Objective:     Vitals:   05/18/24 1028  BP: 100/68  Pulse: 82  SpO2: 96%  Weight: 157 lb (71.2 kg)  Height: 5' 3 (1.6 m)      Body mass index is 27.81 kg/m.    Physical Exam:    Gen: Appears well, nad, nontoxic and pleasant Psych: Alert and oriented, appropriate mood and affect Neuro: sensation intact, strength is 5/5 with df/pf/inv/ev, muscle tone wnl Skin: no susupicious lesions or rashes   Left foot/ankle:  No deformity, no swelling or effusion TTP navicular, anterior ankle joint line NTTP over fibular head, lat mal, medial mal, achilles,  , base of 5th, ATFL, CFL, deltoid, calcaneous or midfoot ROM DF 30, PF 45, inv/ev intact Negative ant drawer, talar tilt, rotation test, squeeze test. Neg thompson   pain with resisted inversion or eversion     Electronically signed by:  Odis Mace D.[facility] 12:24 PM 05/18/24

## 2024-05-13 ENCOUNTER — Ambulatory Visit: Payer: Self-pay | Admitting: Cardiovascular Disease

## 2024-05-15 ENCOUNTER — Ambulatory Visit: Payer: Self-pay | Admitting: Sports Medicine

## 2024-05-18 ENCOUNTER — Ambulatory Visit (INDEPENDENT_AMBULATORY_CARE_PROVIDER_SITE_OTHER): Admitting: Sports Medicine

## 2024-05-18 VITALS — BP 100/68 | HR 82 | Ht 63.0 in | Wt 157.0 lb

## 2024-05-18 DIAGNOSIS — M949 Disorder of cartilage, unspecified: Secondary | ICD-10-CM | POA: Diagnosis not present

## 2024-05-18 DIAGNOSIS — M79672 Pain in left foot: Secondary | ICD-10-CM | POA: Diagnosis not present

## 2024-05-18 DIAGNOSIS — M899 Disorder of bone, unspecified: Secondary | ICD-10-CM | POA: Diagnosis not present

## 2024-05-18 NOTE — Patient Instructions (Signed)
 Referral to ortho Tylenol  and IBU as needed Use Voltaren gel as needed See me as needed for shoulder

## 2024-06-01 ENCOUNTER — Ambulatory Visit: Admitting: Psychology

## 2024-06-01 ENCOUNTER — Encounter: Payer: Self-pay | Admitting: Psychology

## 2024-06-01 DIAGNOSIS — F321 Major depressive disorder, single episode, moderate: Secondary | ICD-10-CM | POA: Diagnosis not present

## 2024-06-01 NOTE — Progress Notes (Signed)
 Behavioral Health Treatment Plan    Name:Patricia Collier   MRN: [id number]   Treatment Plan Development Date: 11:24:25   Strengths: Friends, Hopefulness, Self Advocate, and Able to Communicate Effectively  Supports: Friends   Theatre Manager of Needs: Lucy would like to work on figuring out how to deal with frustration and anger.  Additional goals include self-care, acceptance of areas of no control,    Treatment Level: Individual Therapy and Psychiatric Treatment  Client Treatment Preferences: Outpatient Therapy.    Diagnosis Depressive Disorders  Depression, major, single episode, moderate (HCC)  Symptoms:  Depressed mood-indicated by subjective report or observation by others (in children and adolescents, can be irritable mood)., Loss of interest or pleasure in almost all activities-indicated by subjective report or observation by others., Significant (more than 5 percent in a month) unintentional weight loss/gain or decrease/increase in appetite (in children, failure to make expected weight gains)., Sleep disturbance (insomnia or hypersomnia)., Tiredness, fatigue, or low energy, or decreased efficiency with which routine tasks are completed., A sense of worthlessness or excessive, inappropriate, or delusional guilt (not merely self-reproach or guilt about being sick)., The symptoms cause clinically significant distress or impairment in social, occupational, or other important areas of functioning., The symptoms are not due to the direct physiological effects of a substance (e.g., drug abuse, a prescribed medication's side effects) or a medical condition (e.g., hypothyroidism)., The symptoms do not meet criteria for a mixed episode, There has never been a manic episode or hypomanic episode., and MDE is not better explained by schizophrenia spectrum or other psychotic disorders.  Goals:  Alleviate depressive symptoms to return to effective functioning., Recognize,  accept, and cope with depressed feelings., and Develop healthy thinking and beliefs about self, others, and the world to alleviate and prevent relapse.  Objectives: Target Date For All Objectives: 06/01/25  Identify and replace thoughts and beliefs that support depression., Learn and implement strategies to overcome depression., Identify how people in your life impacted your mood., Explore interpersonal problems and how to resolve to assist in improving mood., and Learn and implement conflict resolution skills.  Progress Documentation:  Progressing  Interventions:  Cognitive Behavioral Therapy, Assertiveness/Communication, Psycho-education/Bibliotherapy, and Interpersonal   Expected duration of treatment: Evaluate after 1 year of treatment  Party responsible for implementation of interventions: The patient, JYLL TOMARO and Therapist, Elvie Mullet, LCSW.  This plan has been reviewed and created by the following participants: The patient, CORDELLA NYQUIST and Therapist, Elvie Mullet, LCSW.  This plan will be reviewed at least every 12 months.  Status of Treatment Plan Signature:  No, pending signature via MyChart.  Signature:  Elvie Mullet, LCSW

## 2024-06-01 NOTE — Progress Notes (Signed)
 [facility] Counselor/Therapist Progress Note  Patient ID: Patricia Collier, MRN: [id number]    Date: 06/01/24  Time Spent:  1:32  pm - 2:30 pm : 58 Minutes  Treatment Type: Individual Therapy.  Reported Symptoms: Depression  Mental Status Exam: Appearance:  Casual     Behavior: Appropriate  Motor: Normal  Speech/Language:  Clear and Coherent  Affect: Congruent  Mood: normal  Thought process: normal  Thought content:   WNL  Sensory/Perceptual disturbances:   WNL  Orientation: oriented to person, place, time/date, and situation  Attention: Good  Concentration: Good  Memory: WNL  Fund of knowledge:  Good  Insight:   Good  Judgment:  Good  Impulse Control: Good   Risk Assessment: Danger to Self:  No Self-injurious Behavior: No Danger to Others: No Duty to Warn:no Physical Aggression / Violence:No  Access to Firearms a concern: No  Gang Involvement:No   Subjective:   Patricia Collier participated in the session, in person in the office with the therapist, and consented to treatment Patricia Collier reviewed the events of the past week.   Patricia Collier noted her frustration regarding Patricia Collier's lack of engagement in self-care. She noted a lack of communication of needs, working on maintaining effort in areas he can engage in, and noted Patricia Collier's general self-focus. She noted her efforts to give feedback, to no avail.   We reviewed numerous treatment approaches including CBT, BA, Problem Solving, and Solution focused therapy. Psych-education regarding the Lakethia's diagnosis of Depression, major, single episode, moderate (HCC) was provided during the session. We discussed Patricia Collier's goals treatment goals which include working on figuring out how to deal with frustration and anger.  Additional goals include self-care, acceptance of areas of no control.Patricia Collier provided verbal approval of the treatment plan.    Interventions: Psycho-education & Goal Setting.    Diagnosis:   Depression, major, single episode, moderate (HCC)  Psychiatric Treatment: Yes , via PCP.   Patricia Mullet, LCSW    Behavioral Health Treatment Plan    Name:Patricia Collier   MRN: [id number]   Treatment Plan Development Date: 11:24:25   Strengths: Friends, Hopefulness, Self Advocate, and Able to Communicate Effectively  Supports: Friends   Theatre Manager of Needs: Patricia Collier would like to work on figuring out how to deal with frustration and anger.  Additional goals include self-care, acceptance of areas of no control.   Treatment Level: Individual Therapy and Psychiatric Treatment  Client Treatment Preferences: Outpatient Therapy.    Diagnosis Depressive Disorders  Depression, major, single episode, moderate (HCC)  Symptoms:  Depressed mood-indicated by subjective report or observation by others (in children and adolescents, can be irritable mood)., Loss of interest or pleasure in almost all activities-indicated by subjective report or observation by others., Significant (more than 5 percent in a month) unintentional weight loss/gain or decrease/increase in appetite (in children, failure to make expected weight gains)., Sleep disturbance (insomnia or hypersomnia)., Tiredness, fatigue, or low energy, or decreased efficiency with which routine tasks are completed., A sense of worthlessness or excessive, inappropriate, or delusional guilt (not merely self-reproach or guilt about being sick)., The symptoms cause clinically significant distress or impairment in social, occupational, or other important areas of functioning., The symptoms are not due to the direct physiological effects of a substance (e.g., drug abuse, a prescribed medication's side effects) or a medical condition (e.g., hypothyroidism)., The symptoms do not meet criteria for a mixed episode, There has never been a manic episode or hypomanic episode., and  MDE is not better explained by  schizophrenia spectrum or other psychotic disorders.  Goals:  Alleviate depressive symptoms to return to effective functioning., Recognize, accept, and cope with depressed feelings., and Develop healthy thinking and beliefs about self, others, and the world to alleviate and prevent relapse.  Objectives: Target Date For All Objectives: 06/01/25  Identify and replace thoughts and beliefs that support depression., Learn and implement strategies to overcome depression., Identify how people in your life impacted your mood., Explore interpersonal problems and how to resolve to assist in improving mood., and Learn and implement conflict resolution skills.  Progress Documentation:  Progressing  Interventions:  Cognitive Behavioral Therapy, Assertiveness/Communication, Psycho-education/Bibliotherapy, and Interpersonal   Expected duration of treatment: Evaluate after 1 year of treatment  Party responsible for implementation of interventions: The patient, Patricia Collier and Therapist, Patricia Mullet, LCSW.  This plan has been reviewed and created by the following participants: The patient, Patricia Collier and Therapist, Patricia Mullet, LCSW.  This plan will be reviewed at least every 12 months.  Status of Treatment Plan Signature:  No, pending signature via MyChart.  Signature:  Patricia Mullet, LCSW

## 2024-06-03 DIAGNOSIS — M79672 Pain in left foot: Secondary | ICD-10-CM | POA: Diagnosis not present

## 2024-06-03 DIAGNOSIS — M19072 Primary osteoarthritis, left ankle and foot: Secondary | ICD-10-CM | POA: Diagnosis not present

## 2024-06-15 ENCOUNTER — Ambulatory Visit: Admitting: Psychology

## 2024-06-15 DIAGNOSIS — F321 Major depressive disorder, single episode, moderate: Secondary | ICD-10-CM

## 2024-06-15 NOTE — Progress Notes (Signed)
 [facility] Counselor/Therapist Progress Note  Patient ID: Patricia Collier, MRN: [id number]    Date: 06/15/24  Time Spent:  1:35  pm - 2:31 pm : 56 Minutes  Treatment Type: Individual Therapy.  Reported Symptoms: Depression  Mental Status Exam: Appearance:  Casual     Behavior: Appropriate  Motor: Normal  Speech/Language:  Clear and Coherent  Affect: Congruent  Mood: dysthymic  Thought process: normal  Thought content:   WNL  Sensory/Perceptual disturbances:   WNL  Orientation: oriented to person, place, time/date, and situation  Attention: Good  Concentration: Good  Memory: WNL  Fund of knowledge:  Good  Insight:   Good  Judgment:  Good  Impulse Control: Good   Risk Assessment: Danger to Self:  No Self-injurious Behavior: No Danger to Others: No Duty to Warn:no Physical Aggression / Violence:No  Access to Firearms a concern: No  Gang Involvement:No   Subjective:   Patricia Collier participated in the session, in person in the office with the therapist, and consented to treatment Patricia Collier reviewed the events of the past week. She reflected on the events of the past two weeks. She noted having to cancel her husband's aid due to her husband's attitude regarding the service and lack of engagement. She noted that her husband complained about it so much. She noted that he won but that she continues to set boundaries by not aiding in tasks that he is able to do. She noted her frustration that she does everything for her husband and that he belittles the things I do for me. She noted often feeling a sense of guilt for feeling happy when she is not home. She noted not liking to argue or fight. We worked on exploring this during the session and the lack of in-depth conversation and conflict resolution. Therapist highlighted Joe's detracting behavior  and Patricia Collier's avoidance to  address issues more thoroughly.  She noted the possibility of her husband  being oblivious to her daily output. Therapist reviewed communication, assertiveness, redirection, and communicating feelings clearly and how Joe's behavior affects her. Therapist modeled this during the session. She noted feeling angry about the past choices the might of maintained his level of functioning and autonomy. She noted wondering how she might feel when her husband passes. We worked on exploring this during the session. She noted her husband's history of not attempting any interventions or follow directions by providers to help maintain his functioning. We continued to process this during the session and how to manages her feelings in this area. Therapist highlighted the possibility that Patricia Collier is experiencing grief regarding how things are verses how she expected things to be. Patricia Collier was engaged and receptive during the session. She expressed commitment towards goals. Therapist praised Patricia Collier for her effort and provided supportive therapy. A Follow-up was scheduled for continued treatment, which Patricia Collier benefits from.    Interventions: CBT and interpersonal.   Diagnosis:   Depression, major, single episode, moderate (HCC)  Psychiatric Treatment: Yes , via PCP.   Elvie Mullet, LCSW    Behavioral Health Treatment Plan    Name:Patricia Collier   MRN: [id number]   Treatment Plan Development Date: 11:24:25   Strengths: Friends, Hopefulness, Self Advocate, and Able to Communicate Effectively  Supports: Friends   Theatre Manager of Needs: Patricia Collier would like to work on figuring out how to deal with frustration and anger.  Additional goals include self-care, acceptance of areas of no control.   Treatment Level: Individual Therapy and Psychiatric  Treatment  Client Treatment Preferences: Outpatient Therapy.    Diagnosis Depressive Disorders  Depression, major, single episode, moderate (HCC)  Symptoms:  Depressed mood-indicated by subjective report or observation  by others (in children and adolescents, can be irritable mood)., Loss of interest or pleasure in almost all activities-indicated by subjective report or observation by others., Significant (more than 5 percent in a month) unintentional weight loss/gain or decrease/increase in appetite (in children, failure to make expected weight gains)., Sleep disturbance (insomnia or hypersomnia)., Tiredness, fatigue, or low energy, or decreased efficiency with which routine tasks are completed., A sense of worthlessness or excessive, inappropriate, or delusional guilt (not merely self-reproach or guilt about being sick)., The symptoms cause clinically significant distress or impairment in social, occupational, or other important areas of functioning., The symptoms are not due to the direct physiological effects of a substance (e.g., drug abuse, a prescribed medication's side effects) or a medical condition (e.g., hypothyroidism)., The symptoms do not meet criteria for a mixed episode, There has never been a manic episode or hypomanic episode., and MDE is not better explained by schizophrenia spectrum or other psychotic disorders.  Goals:  Alleviate depressive symptoms to return to effective functioning., Recognize, accept, and cope with depressed feelings., and Develop healthy thinking and beliefs about self, others, and the world to alleviate and prevent relapse.  Objectives: Target Date For All Objectives: 06/01/25  Identify and replace thoughts and beliefs that support depression., Learn and implement strategies to overcome depression., Identify how people in your life impacted your mood., Explore interpersonal problems and how to resolve to assist in improving mood., and Learn and implement conflict resolution skills.  Progress Documentation:  Progressing  Interventions:  Cognitive Behavioral Therapy, Assertiveness/Communication, Psycho-education/Bibliotherapy, and Interpersonal   Expected duration of  treatment: Evaluate after 1 year of treatment  Party responsible for implementation of interventions: The patient, Patricia Collier and Therapist, Elvie Mullet, LCSW.  This plan has been reviewed and created by the following participants: The patient, Patricia Collier and Therapist, Elvie Mullet, LCSW.  This plan will be reviewed at least every 12 months.  Status of Treatment Plan Signature:  Yes, please see patient chart for sign off.  Signature:   Elvie Mullet, LCSW

## 2024-06-15 NOTE — Progress Notes (Signed)
 Behavioral Health Treatment Plan    Name:Patricia Collier   MRN: [id number]   Treatment Plan Development Date: 11:24:25   Strengths: Friends, Hopefulness, Self Advocate, and Able to Communicate Effectively  Supports: Friends   Theatre Manager of Needs: Meah would like to work on figuring out how to deal with frustration and anger.  Additional goals include self-care, acceptance of areas of no control,    Treatment Level: Individual Therapy and Psychiatric Treatment  Client Treatment Preferences: Outpatient Therapy.    Diagnosis Depressive Disorders  Depression, major, single episode, moderate (HCC)  Symptoms:  Depressed mood-indicated by subjective report or observation by others (in children and adolescents, can be irritable mood)., Loss of interest or pleasure in almost all activities-indicated by subjective report or observation by others., Significant (more than 5 percent in a month) unintentional weight loss/gain or decrease/increase in appetite (in children, failure to make expected weight gains)., Sleep disturbance (insomnia or hypersomnia)., Tiredness, fatigue, or low energy, or decreased efficiency with which routine tasks are completed., A sense of worthlessness or excessive, inappropriate, or delusional guilt (not merely self-reproach or guilt about being sick)., The symptoms cause clinically significant distress or impairment in social, occupational, or other important areas of functioning., The symptoms are not due to the direct physiological effects of a substance (e.g., drug abuse, a prescribed medication's side effects) or a medical condition (e.g., hypothyroidism)., The symptoms do not meet criteria for a mixed episode, There has never been a manic episode or hypomanic episode., and MDE is not better explained by schizophrenia spectrum or other psychotic disorders.  Goals:  Alleviate depressive symptoms to return to effective functioning., Recognize,  accept, and cope with depressed feelings., and Develop healthy thinking and beliefs about self, others, and the world to alleviate and prevent relapse.  Objectives: Target Date For All Objectives: 06/01/25  Identify and replace thoughts and beliefs that support depression., Learn and implement strategies to overcome depression., Identify how people in your life impacted your mood., Explore interpersonal problems and how to resolve to assist in improving mood., and Learn and implement conflict resolution skills.  Progress Documentation:  Progressing  Interventions:  Cognitive Behavioral Therapy, Assertiveness/Communication, Psycho-education/Bibliotherapy, and Interpersonal   Expected duration of treatment: Evaluate after 1 year of treatment  Party responsible for implementation of interventions: The patient, HONEST SAFRANEK and Therapist, Elvie Mullet, LCSW.  This plan has been reviewed and created by the following participants: The patient, HIBAH ODONNELL and Therapist, Elvie Mullet, LCSW.  This plan will be reviewed at least every 12 months.  Status of Treatment Plan Signature:  Yes, please see patient chart for sign off.  Signature:  Elvie Mullet, LCSW

## 2024-06-17 ENCOUNTER — Ambulatory Visit: Admitting: Family Medicine

## 2024-06-17 ENCOUNTER — Encounter: Payer: Self-pay | Admitting: Family Medicine

## 2024-06-17 ENCOUNTER — Other Ambulatory Visit (HOSPITAL_COMMUNITY): Payer: Self-pay

## 2024-06-17 VITALS — BP 118/60 | HR 72 | Temp 97.9°F | Resp 18 | Ht 63.0 in | Wt 161.0 lb

## 2024-06-17 DIAGNOSIS — Z853 Personal history of malignant neoplasm of breast: Secondary | ICD-10-CM

## 2024-06-17 DIAGNOSIS — F321 Major depressive disorder, single episode, moderate: Secondary | ICD-10-CM | POA: Diagnosis not present

## 2024-06-17 MED ORDER — BUPROPION HCL 75 MG PO TABS
75.0000 mg | ORAL_TABLET | Freq: Two times a day (BID) | ORAL | 3 refills | Status: AC
  Filled 2024-06-17: qty 50, 25d supply, fill #0
  Filled 2024-06-17: qty 10, 5d supply, fill #0
  Filled 2024-07-12: qty 60, 30d supply, fill #1
  Filled 2024-08-13: qty 60, 30d supply, fill #2

## 2024-06-17 NOTE — Progress Notes (Signed)
° °  Subjective:    Patient ID: Patricia Collier, female    DOB: 11-23-1945, 78 y.o.   MRN: [id number]  HPI Depression- at last visit we increased Fluoxetine  to 20mg  daily.  Is no longer 'crying all the time'.  Still irritable and angry.  Currently in therapy, but isn't sure how beneficial this is.  Pt is also stressed about her weight- she is eating comfort food, limited exercise.  Denies SI/HI.     Review of Systems For ROS see HPI     Objective:   Physical Exam Vitals reviewed.  Constitutional:      General: She is not in acute distress.    Appearance: Normal appearance. She is not ill-appearing.  HENT:     Head: Normocephalic and atraumatic.  Eyes:     Extraocular Movements: Extraocular movements intact.     Conjunctiva/sclera: Conjunctivae normal.  Skin:    General: Skin is warm and dry.  Neurological:     General: No focal deficit present.     Mental Status: She is alert and oriented to person, place, and time.  Psychiatric:        Mood and Affect: Mood normal.        Behavior: Behavior normal.        Thought Content: Thought content normal.           Assessment & Plan:

## 2024-06-17 NOTE — Patient Instructions (Signed)
 Follow up in 6 weeks to recheck mood CONTINUE the Fluoxetine  daily START the Wellbutrin  twice daily to help w/ mood and improve appetite I'm SO proud of you for doing therapy! Call with any questions or concerns Stay Safe!  Stay Healthy! HAPPY BIRTHDAY!!!

## 2024-06-20 NOTE — Assessment & Plan Note (Signed)
 Ongoing issue.  Since increasing fluoxetine  dose she is no longer crying all the time but is still angry and irritable much of the time.  Is in therapy but not sure this is beneficial.  To deal w/ her stress, she has been eating comfort food and the weight gain is upsetting her.  She wants to feel better and feel more in control of her emotions.  Discussed adding Wellbutrin  for both mood and appetite suppression.  Pt is agreeable to this.  Will start 75mg  BID and titrate prn.  Pt expressed understanding and is in agreement w/ plan.

## 2024-07-07 ENCOUNTER — Other Ambulatory Visit (HOSPITAL_COMMUNITY): Payer: Self-pay

## 2024-07-10 ENCOUNTER — Other Ambulatory Visit (HOSPITAL_COMMUNITY): Payer: Self-pay

## 2024-07-18 ENCOUNTER — Other Ambulatory Visit: Payer: Self-pay | Admitting: Family Medicine

## 2024-07-20 ENCOUNTER — Ambulatory Visit: Admitting: Psychology

## 2024-07-20 ENCOUNTER — Other Ambulatory Visit: Payer: Self-pay

## 2024-07-20 ENCOUNTER — Other Ambulatory Visit (HOSPITAL_COMMUNITY): Payer: Self-pay

## 2024-07-20 DIAGNOSIS — F321 Major depressive disorder, single episode, moderate: Secondary | ICD-10-CM | POA: Diagnosis not present

## 2024-07-20 MED ORDER — ROSUVASTATIN CALCIUM 10 MG PO TABS
10.0000 mg | ORAL_TABLET | Freq: Every evening | ORAL | 1 refills | Status: AC
  Filled 2024-07-20: qty 90, 90d supply, fill #0

## 2024-07-20 NOTE — Progress Notes (Signed)
 [facility] Counselor/Therapist Progress Note  Patient ID: Patricia Collier, MRN: [id number]    Date: 07/20/2024  Time Spent:  2:02  pm - 2:57 pm : 55 Minutes  Treatment Type: Individual Therapy.  Reported Symptoms: Depression  Mental Status Exam: Appearance:  Casual     Behavior: Appropriate  Motor: Normal  Speech/Language:  Clear and Coherent  Affect: Congruent  Mood: dysthymic  Thought process: normal  Thought content:   WNL  Sensory/Perceptual disturbances:   WNL  Orientation: oriented to person, place, time/date, and situation  Attention: Good  Concentration: Good  Memory: WNL  Fund of knowledge:  Good  Insight:   Good  Judgment:  Good  Impulse Control: Good   Risk Assessment: Danger to Self:  Noe Self-injurious Behavior: No Danger to Others: No Duty to Warn:no Physical Aggression / Violence:No  Access to Firearms a concern: No  Gang Involvement:No   Subjective:   Patricia Collier participated in the session, in person in the office with the therapist, and consented to treatment Erikah reviewed the events of the past week. Patricia Collier noted some changes in her husband's pain medication regimen and is hopeful that this will be helpful. She noted recently having Wellbutrin  75 mg bid. She noted tolerating this well and denied any effect on her sleep. She noted a need to increase her exercise. She noted a lack of ambition to exercise and noted a need to get her eating and weight in control.  She denied any time related barriers to exercise. She noted barrier to exercise include poor stamina, poor balance, PTSD from fall (knocked my teeth out [August 2023]), new setting, not knowing people at a class. She noted its not the people that stress me out, it's not knowing the full routine. She noted interest in an exercise buddy. She noted having the necessary equipment at home but noted this continued barriers to engage. She noted having a silver sneakers.  Therapist worked on reviewing and employing BA principles including schedule, setting reasonable expectations, proactively addressing barriers, preparation for task ahead of time, and monitoring self-talk. She noted a need to schedule her exercise in the AM as later in the day can be a barrier. We worked on creating a tentative plan, employing principles of BA, and Annitta was receptive during the session. She noted feeling more like a nurse and not a wife. She noted her husband's overall dependence on her for various tasks. Patricia Collier highlighted various tasks that her husband is able to complete, which often he depends on her for. We worked on exploring this. Therapist encouraged Patricia Collier to work on creating a list of those tasks for future processing. Therapist praised Patricia Collier for her effort during the session and commitment towards treatment goals. Therapist praised Patricia Collier and provided supportive therapy. A follow-up was scheduled for continued treatment, which Patricia Collier benefits from.    Interventions: CBT and interpersonal.   Diagnosis:   Depression, major, single episode, moderate (HCC)  Psychiatric Treatment: Yes , via PCP.   Patricia Mullet, LCSW    Behavioral Health Treatment Plan    Patricia Collier   MRN: [id number]   Treatment Plan Development Date: 11:24:25   Strengths: Friends, Hopefulness, Self Advocate, and Able to Communicate Effectively  Supports: Friends   Theatre Manager of Needs: Margurite would like to work on figuring out how to deal with frustration and anger.  Additional goals include self-care, acceptance of areas of no control.   Treatment Level: Individual Therapy and Psychiatric Treatment  Client Treatment Preferences: Outpatient Therapy.    Diagnosis Depressive Disorders  Depression, major, single episode, moderate (HCC)  Symptoms:  Depressed mood-indicated by subjective report or observation by others (in children and adolescents, can be  irritable mood)., Loss of interest or pleasure in almost all activities-indicated by subjective report or observation by others., Significant (more than 5 percent in a month) unintentional weight loss/gain or decrease/increase in appetite (in children, failure to make expected weight gains)., Sleep disturbance (insomnia or hypersomnia)., Tiredness, fatigue, or low energy, or decreased efficiency with which routine tasks are completed., A sense of worthlessness or excessive, inappropriate, or delusional guilt (not merely self-reproach or guilt about being sick)., The symptoms cause clinically significant distress or impairment in social, occupational, or other important areas of functioning., The symptoms are not due to the direct physiological effects of a substance (e.g., drug abuse, a prescribed medication's side effects) or a medical condition (e.g., hypothyroidism)., The symptoms do not meet criteria for a mixed episode, There has never been a manic episode or hypomanic episode., and MDE is not better explained by schizophrenia spectrum or other psychotic disorders.  Goals:  Alleviate depressive symptoms to return to effective functioning., Recognize, accept, and cope with depressed feelings., and Develop healthy thinking and beliefs about self, others, and the world to alleviate and prevent relapse.  Objectives: Target Date For All Objectives: 06/01/25  Identify and replace thoughts and beliefs that support depression., Learn and implement strategies to overcome depression., Identify how people in your life impacted your mood., Explore interpersonal problems and how to resolve to assist in improving mood., and Learn and implement conflict resolution skills.  Progress Documentation:  Progressing  Interventions:  Cognitive Behavioral Therapy, Assertiveness/Communication, Psycho-education/Bibliotherapy, and Interpersonal   Expected duration of treatment: Evaluate after 1 year of  treatment  Party responsible for implementation of interventions: The patient, Patricia Collier and Therapist, Patricia Mullet, LCSW.  This plan has been reviewed and created by the following participants: The patient, Patricia Collier and Therapist, Patricia Mullet, LCSW.  This plan will be reviewed at least every 12 months.  Status of Treatment Plan Signature:  Yes, please see patient chart for sign off.  Signature:   Patricia Mullet, LCSW

## 2024-07-20 NOTE — Progress Notes (Signed)
 Behavioral Health Treatment Plan    Name:Patricia Collier   MRN: [id number]   Treatment Plan Development Date: 11:24:25   Strengths: Friends, Hopefulness, Self Advocate, and Able to Communicate Effectively  Supports: Friends   Theatre Manager of Needs: Meah would like to work on figuring out how to deal with frustration and anger.  Additional goals include self-care, acceptance of areas of no control,    Treatment Level: Individual Therapy and Psychiatric Treatment  Client Treatment Preferences: Outpatient Therapy.    Diagnosis Depressive Disorders  Depression, major, single episode, moderate (HCC)  Symptoms:  Depressed mood-indicated by subjective report or observation by others (in children and adolescents, can be irritable mood)., Loss of interest or pleasure in almost all activities-indicated by subjective report or observation by others., Significant (more than 5 percent in a month) unintentional weight loss/gain or decrease/increase in appetite (in children, failure to make expected weight gains)., Sleep disturbance (insomnia or hypersomnia)., Tiredness, fatigue, or low energy, or decreased efficiency with which routine tasks are completed., A sense of worthlessness or excessive, inappropriate, or delusional guilt (not merely self-reproach or guilt about being sick)., The symptoms cause clinically significant distress or impairment in social, occupational, or other important areas of functioning., The symptoms are not due to the direct physiological effects of a substance (e.g., drug abuse, a prescribed medication's side effects) or a medical condition (e.g., hypothyroidism)., The symptoms do not meet criteria for a mixed episode, There has never been a manic episode or hypomanic episode., and MDE is not better explained by schizophrenia spectrum or other psychotic disorders.  Goals:  Alleviate depressive symptoms to return to effective functioning., Recognize,  accept, and cope with depressed feelings., and Develop healthy thinking and beliefs about self, others, and the world to alleviate and prevent relapse.  Objectives: Target Date For All Objectives: 06/01/25  Identify and replace thoughts and beliefs that support depression., Learn and implement strategies to overcome depression., Identify how people in your life impacted your mood., Explore interpersonal problems and how to resolve to assist in improving mood., and Learn and implement conflict resolution skills.  Progress Documentation:  Progressing  Interventions:  Cognitive Behavioral Therapy, Assertiveness/Communication, Psycho-education/Bibliotherapy, and Interpersonal   Expected duration of treatment: Evaluate after 1 year of treatment  Party responsible for implementation of interventions: The patient, Patricia Collier and Therapist, Elvie Mullet, LCSW.  This plan has been reviewed and created by the following participants: The patient, Patricia Collier and Therapist, Elvie Mullet, LCSW.  This plan will be reviewed at least every 12 months.  Status of Treatment Plan Signature:  Yes, please see patient chart for sign off.  Signature:  Elvie Mullet, LCSW

## 2024-07-24 ENCOUNTER — Encounter

## 2024-07-28 ENCOUNTER — Ambulatory Visit

## 2024-07-28 DIAGNOSIS — I447 Left bundle-branch block, unspecified: Secondary | ICD-10-CM

## 2024-07-29 ENCOUNTER — Encounter: Payer: Self-pay | Admitting: Family Medicine

## 2024-07-29 ENCOUNTER — Ambulatory Visit: Admitting: Family Medicine

## 2024-07-29 VITALS — BP 118/82 | HR 76 | Ht 63.0 in | Wt 156.0 lb

## 2024-07-29 DIAGNOSIS — F321 Major depressive disorder, single episode, moderate: Secondary | ICD-10-CM

## 2024-07-29 NOTE — Patient Instructions (Signed)
 Schedule your complete physical for late June or early July No med changes today- you're doing great! Call with any questions or concerns Stay Safe!  Stay Healthy! Happy New Year!!

## 2024-07-29 NOTE — Progress Notes (Signed)
" ° °  Subjective:    Patient ID: Rollene GORMAN Collet, female    DOB: 09/18/1945, 79 y.o.   MRN: [id number]  HPI Depression- at last visit we added Wellbutrin  75mg  BID to her daily Fluoxetine .  Pt reports adding Wellbutrin  has been beneficial.  'there are more days I feel like the old Maggie'.  Having some issues w/ constipation but less food cravings.  Pt feels less irritable, less angry.   Review of Systems For ROS see HPI     Objective:   Physical Exam Vitals reviewed.  Constitutional:      General: She is not in acute distress.    Appearance: Normal appearance. She is not ill-appearing.  HENT:     Head: Normocephalic and atraumatic.  Eyes:     Extraocular Movements: Extraocular movements intact.     Conjunctiva/sclera: Conjunctivae normal.  Cardiovascular:     Rate and Rhythm: Normal rate.  Pulmonary:     Effort: Pulmonary effort is normal. No respiratory distress.  Skin:    General: Skin is warm and dry.  Neurological:     General: No focal deficit present.     Mental Status: She is alert and oriented to person, place, and time.  Psychiatric:        Mood and Affect: Mood normal.        Behavior: Behavior normal.        Thought Content: Thought content normal.           Assessment & Plan:    "

## 2024-07-31 LAB — CUP PACEART REMOTE DEVICE CHECK
Battery Remaining Longevity: 80 mo
Battery Remaining Percentage: 86 %
Battery Voltage: 2.98 V
Brady Statistic AP VP Percent: 2.7 %
Brady Statistic AP VS Percent: 1 %
Brady Statistic AS VP Percent: 97 %
Brady Statistic AS VS Percent: 1 %
Brady Statistic RA Percent Paced: 2.6 %
Date Time Interrogation Session: 20260122123545
HighPow Impedance: 65 Ohm
Implantable Lead Connection Status: 753985
Implantable Lead Connection Status: 753985
Implantable Lead Connection Status: 753985
Implantable Lead Implant Date: 20250306
Implantable Lead Implant Date: 20250306
Implantable Lead Implant Date: 20250306
Implantable Lead Location: 753858
Implantable Lead Location: 753859
Implantable Lead Location: 753860
Implantable Pulse Generator Implant Date: 20250306
Lead Channel Impedance Value: 1300 Ohm
Lead Channel Impedance Value: 430 Ohm
Lead Channel Impedance Value: 450 Ohm
Lead Channel Pacing Threshold Amplitude: 0.5 V
Lead Channel Pacing Threshold Amplitude: 0.75 V
Lead Channel Pacing Threshold Amplitude: 1 V
Lead Channel Pacing Threshold Pulse Width: 0.5 ms
Lead Channel Pacing Threshold Pulse Width: 0.5 ms
Lead Channel Pacing Threshold Pulse Width: 0.5 ms
Lead Channel Sensing Intrinsic Amplitude: 1.9 mV
Lead Channel Sensing Intrinsic Amplitude: 11.8 mV
Lead Channel Setting Pacing Amplitude: 2 V
Lead Channel Setting Pacing Amplitude: 2.5 V
Lead Channel Setting Pacing Amplitude: 2.5 V
Lead Channel Setting Pacing Pulse Width: 0.5 ms
Lead Channel Setting Pacing Pulse Width: 0.5 ms
Lead Channel Setting Sensing Sensitivity: 0.5 mV
Pulse Gen Serial Number: 810126124
Zone Setting Status: 755011

## 2024-07-31 NOTE — Progress Notes (Signed)
 Remote ICD Transmission

## 2024-08-01 NOTE — Assessment & Plan Note (Signed)
 Pt reports things are much better since adding Wellbutrin  to her daily Fluoxetine .  She feels like her old self.  Less irritable, less angry, less food cravings.  No med changes at this time.  Will continue to follow.

## 2024-08-03 ENCOUNTER — Ambulatory Visit: Admitting: Psychology

## 2024-08-04 ENCOUNTER — Ambulatory Visit: Payer: Self-pay | Admitting: Cardiovascular Disease

## 2024-08-25 ENCOUNTER — Ambulatory Visit: Admitting: Psychology

## 2024-10-23 ENCOUNTER — Encounter

## 2024-10-27 ENCOUNTER — Encounter

## 2025-01-01 ENCOUNTER — Encounter: Admitting: Family Medicine

## 2025-01-22 ENCOUNTER — Encounter

## 2025-01-26 ENCOUNTER — Encounter

## 2025-04-23 ENCOUNTER — Encounter

## 2025-04-27 ENCOUNTER — Encounter

## 2025-07-23 ENCOUNTER — Encounter

## 2025-07-27 ENCOUNTER — Encounter
